# Patient Record
Sex: Female | Born: 1945
Health system: Southern US, Community
[De-identification: ages and names within clinical notes are randomized; demographics above are authoritative.]

## PROBLEM LIST (undated history)

## (undated) DIAGNOSIS — Z860100 Personal history of colon polyps, unspecified: Secondary | ICD-10-CM

## (undated) DIAGNOSIS — M549 Dorsalgia, unspecified: Secondary | ICD-10-CM

## (undated) DIAGNOSIS — G43909 Migraine, unspecified, not intractable, without status migrainosus: Secondary | ICD-10-CM

## (undated) DIAGNOSIS — F329 Major depressive disorder, single episode, unspecified: Secondary | ICD-10-CM

## (undated) DIAGNOSIS — K219 Gastro-esophageal reflux disease without esophagitis: Secondary | ICD-10-CM

## (undated) DIAGNOSIS — Z8619 Personal history of other infectious and parasitic diseases: Secondary | ICD-10-CM

## (undated) DIAGNOSIS — E785 Hyperlipidemia, unspecified: Secondary | ICD-10-CM

## (undated) DIAGNOSIS — G8929 Other chronic pain: Secondary | ICD-10-CM

## (undated) DIAGNOSIS — M5430 Sciatica, unspecified side: Secondary | ICD-10-CM

## (undated) DIAGNOSIS — F418 Other specified anxiety disorders: Secondary | ICD-10-CM

## (undated) DIAGNOSIS — S22000A Wedge compression fracture of unspecified thoracic vertebra, initial encounter for closed fracture: Secondary | ICD-10-CM

## (undated) DIAGNOSIS — K922 Gastrointestinal hemorrhage, unspecified: Secondary | ICD-10-CM

## (undated) DIAGNOSIS — F32A Depression, unspecified: Secondary | ICD-10-CM

## (undated) DIAGNOSIS — T7840XA Allergy, unspecified, initial encounter: Secondary | ICD-10-CM

## (undated) DIAGNOSIS — M542 Cervicalgia: Secondary | ICD-10-CM

## (undated) DIAGNOSIS — K589 Irritable bowel syndrome without diarrhea: Secondary | ICD-10-CM

## (undated) DIAGNOSIS — Z8719 Personal history of other diseases of the digestive system: Secondary | ICD-10-CM

## (undated) DIAGNOSIS — M1990 Unspecified osteoarthritis, unspecified site: Secondary | ICD-10-CM

## (undated) DIAGNOSIS — F419 Anxiety disorder, unspecified: Secondary | ICD-10-CM

## (undated) HISTORY — DX: Unspecified osteoarthritis, unspecified site: M19.90

## (undated) HISTORY — DX: Migraine, unspecified, not intractable, without status migrainosus: G43.909

## (undated) HISTORY — DX: Depression, unspecified: F32.A

## (undated) HISTORY — DX: Hyperlipidemia, unspecified: E78.5

## (undated) HISTORY — PX: CERVICAL CONE BIOPSY: SUR198

## (undated) HISTORY — PX: ABDOMINAL HYSTERECTOMY: SHX81

## (undated) HISTORY — DX: Irritable bowel syndrome, unspecified: K58.9

## (undated) HISTORY — DX: Other specified anxiety disorders: F41.8

## (undated) HISTORY — PX: UPPER GASTROINTESTINAL ENDOSCOPY: SHX188

## (undated) HISTORY — DX: Major depressive disorder, single episode, unspecified: F32.9

## (undated) HISTORY — DX: Personal history of other infectious and parasitic diseases: Z86.19

## (undated) HISTORY — PX: APPENDECTOMY: SHX54

## (undated) HISTORY — DX: Personal history of colonic polyps: Z86.010

## (undated) HISTORY — DX: Personal history of colon polyps, unspecified: Z86.0100

## (undated) HISTORY — PX: POLYPECTOMY: SHX149

## (undated) HISTORY — DX: Allergy, unspecified, initial encounter: T78.40XA

---

## 1998-03-02 ENCOUNTER — Ambulatory Visit (HOSPITAL_COMMUNITY): Admission: RE | Admit: 1998-03-02 | Discharge: 1998-03-02 | Payer: Self-pay | Admitting: Family Medicine

## 1998-10-07 ENCOUNTER — Encounter: Payer: Self-pay | Admitting: Cardiology

## 1998-10-07 ENCOUNTER — Ambulatory Visit (HOSPITAL_COMMUNITY): Admission: RE | Admit: 1998-10-07 | Discharge: 1998-10-07 | Payer: Self-pay | Admitting: Cardiology

## 1999-05-09 ENCOUNTER — Encounter: Admission: RE | Admit: 1999-05-09 | Discharge: 1999-05-09 | Payer: Self-pay | Admitting: Hematology and Oncology

## 2000-07-30 ENCOUNTER — Other Ambulatory Visit: Admission: RE | Admit: 2000-07-30 | Discharge: 2000-07-30 | Payer: Self-pay | Admitting: Obstetrics and Gynecology

## 2000-08-03 ENCOUNTER — Encounter: Admission: RE | Admit: 2000-08-03 | Discharge: 2000-08-03 | Payer: Self-pay | Admitting: Obstetrics and Gynecology

## 2000-08-03 ENCOUNTER — Encounter: Payer: Self-pay | Admitting: Obstetrics and Gynecology

## 2003-07-04 HISTORY — PX: COLONOSCOPY: SHX174

## 2003-07-04 LAB — HM COLONOSCOPY

## 2003-10-19 ENCOUNTER — Ambulatory Visit (HOSPITAL_COMMUNITY): Admission: RE | Admit: 2003-10-19 | Discharge: 2003-10-19 | Payer: Self-pay | Admitting: *Deleted

## 2012-12-13 ENCOUNTER — Encounter: Payer: Self-pay | Admitting: Internal Medicine

## 2012-12-13 ENCOUNTER — Other Ambulatory Visit (INDEPENDENT_AMBULATORY_CARE_PROVIDER_SITE_OTHER): Payer: Medicare PPO

## 2012-12-13 ENCOUNTER — Ambulatory Visit (INDEPENDENT_AMBULATORY_CARE_PROVIDER_SITE_OTHER)
Admission: RE | Admit: 2012-12-13 | Discharge: 2012-12-13 | Disposition: A | Discharge status: Home / Self Care | Payer: Medicare PPO | Source: Ambulatory Visit | Attending: Internal Medicine | Admitting: Internal Medicine

## 2012-12-13 ENCOUNTER — Ambulatory Visit (INDEPENDENT_AMBULATORY_CARE_PROVIDER_SITE_OTHER): Payer: Medicare PPO | Admitting: Internal Medicine

## 2012-12-13 VITALS — BP 108/72 | HR 77 | Temp 98.0°F | Ht 64.0 in | Wt 134.8 lb

## 2012-12-13 DIAGNOSIS — Z23 Encounter for immunization: Secondary | ICD-10-CM

## 2012-12-13 DIAGNOSIS — K219 Gastro-esophageal reflux disease without esophagitis: Secondary | ICD-10-CM | POA: Insufficient documentation

## 2012-12-13 DIAGNOSIS — Q782 Osteopetrosis: Secondary | ICD-10-CM

## 2012-12-13 DIAGNOSIS — F3289 Other specified depressive episodes: Secondary | ICD-10-CM

## 2012-12-13 DIAGNOSIS — Z13220 Encounter for screening for lipoid disorders: Secondary | ICD-10-CM

## 2012-12-13 DIAGNOSIS — Z136 Encounter for screening for cardiovascular disorders: Secondary | ICD-10-CM

## 2012-12-13 DIAGNOSIS — R5383 Other fatigue: Secondary | ICD-10-CM

## 2012-12-13 DIAGNOSIS — Z1239 Encounter for other screening for malignant neoplasm of breast: Secondary | ICD-10-CM

## 2012-12-13 DIAGNOSIS — R5381 Other malaise: Secondary | ICD-10-CM

## 2012-12-13 DIAGNOSIS — M810 Age-related osteoporosis without current pathological fracture: Secondary | ICD-10-CM | POA: Insufficient documentation

## 2012-12-13 DIAGNOSIS — F329 Major depressive disorder, single episode, unspecified: Secondary | ICD-10-CM

## 2012-12-13 DIAGNOSIS — F32A Depression, unspecified: Secondary | ICD-10-CM | POA: Insufficient documentation

## 2012-12-13 DIAGNOSIS — F339 Major depressive disorder, recurrent, unspecified: Secondary | ICD-10-CM | POA: Insufficient documentation

## 2012-12-13 DIAGNOSIS — Z13820 Encounter for screening for osteoporosis: Secondary | ICD-10-CM

## 2012-12-13 DIAGNOSIS — Z1211 Encounter for screening for malignant neoplasm of colon: Secondary | ICD-10-CM

## 2012-12-13 DIAGNOSIS — Z0000 Encounter for general adult medical examination without abnormal findings: Secondary | ICD-10-CM

## 2012-12-13 HISTORY — DX: Age-related osteoporosis without current pathological fracture: M81.0

## 2012-12-13 HISTORY — DX: Major depressive disorder, single episode, unspecified: F32.9

## 2012-12-13 LAB — CBC
HCT: 38.9 % (ref 36.0–46.0)
MCV: 84.6 fl (ref 78.0–100.0)
Platelets: 305 10*3/uL (ref 150.0–400.0)
RBC: 4.6 Mil/uL (ref 3.87–5.11)
WBC: 4.9 10*3/uL (ref 4.5–10.5)

## 2012-12-13 LAB — BASIC METABOLIC PANEL
BUN: 19 mg/dL (ref 6–23)
CO2: 26 mEq/L (ref 19–32)
Calcium: 9.7 mg/dL (ref 8.4–10.5)
GFR: 57.59 mL/min — ABNORMAL LOW (ref >60.00)
Glucose, Bld: 83 mg/dL (ref 70–99)
Potassium: 4.6 mEq/L (ref 3.5–5.1)

## 2012-12-13 LAB — HEPATIC FUNCTION PANEL
ALT: 18 U/L (ref 0–35)
Bilirubin, Direct: 0.1 mg/dL (ref 0.0–0.3)
Total Bilirubin: 0.8 mg/dL (ref 0.3–1.2)

## 2012-12-13 LAB — LDL CHOLESTEROL, DIRECT: Direct LDL: 161 mg/dL

## 2012-12-13 LAB — HM DEXA SCAN

## 2012-12-13 LAB — LIPID PANEL
HDL: 49.2 mg/dL (ref >39.00)
VLDL: 35.4 mg/dL (ref 0.0–40.0)

## 2012-12-13 LAB — TSH: TSH: 2.62 u[IU]/mL (ref 0.35–5.50)

## 2012-12-13 MED ORDER — OMEPRAZOLE 20 MG PO CPDR: 20.0000 mg | DELAYED_RELEASE_CAPSULE | Freq: Every day | ORAL | Status: DC

## 2012-12-13 MED ORDER — CITALOPRAM HYDROBROMIDE 20 MG PO TABS: 20.0000 mg | ORAL_TABLET | Freq: Every day | ORAL | Status: DC

## 2012-12-13 NOTE — Progress Notes (Signed)
HPI  Pt presents to the clinic today to establish care. She has not seen a doctor in quite a number of years. She does have some concern today about situational depression. She has has issues with this after the loss  Of her husband and her mother. She does have a strong support network but does not typically talk about her feelings. She is keeping it all in. She does intermittently take her husbands celexa as needed. She is not interested in any counseling at this time. She denies si/hi.  Flu: 04/2012 Tdap: more than 10 years ago Pneumovax: never Shingles:never Eye doctor: never Dentist: as needed Pap smear: more than 10 years ago Mammogram: more than 10 years ago Colonoscopy: 2005 (5 years) Dexa Scan: > 2 years ago  Past Medical History  Diagnosis Date  . Arthritis   . Depression   . Migraines   . Allergy   . History of chicken pox   . History of colon polyps     Current Outpatient Prescriptions  Medication Sig Dispense Refill  . Ferrous Sulfate (RA IRON) 27 MG TABS Take 1 tablet by mouth as needed.      . Multiple Vitamin (STRESS FORMULA 500/BIOTIN PO) Take 1 tablet by mouth daily.      Marland Kitchen omeprazole (PRILOSEC) 20 MG capsule Take 20 mg by mouth daily.      . ranitidine (ZANTAC) 150 MG tablet Take 150 mg by mouth as needed for heartburn.       No current facility-administered medications for this visit.    No Known Allergies  Family History  Problem Relation Age of Onset  . Heart disease Mother   . Heart disease Father   . Prostate cancer Father   . Diabetes Sister   . Diabetes Brother     History   Social History  . Marital Status: Married    Spouse Name: N/A    Number of Children: N/A  . Years of Education: 12   Occupational History  . Retired    Social History Main Topics  . Smoking status: Never Smoker   . Smokeless tobacco: Never Used  . Alcohol Use: No  . Drug Use: No  . Sexually Active: Not Currently   Other Topics Concern  . Not on file    Social History Narrative   Regular exercise-no   Caffeine Use-yes    ROS:  Constitutional: Pt reports fatigue. Denies fever, malaise, headache or abrupt weight changes.  HEENT: Denies eye pain, eye redness, ear pain, ringing in the ears, wax buildup, runny nose, nasal congestion, bloody nose, or sore throat. Respiratory: Denies difficulty breathing, shortness of breath, cough or sputum production.   Cardiovascular: Denies chest pain, chest tightness, palpitations or swelling in the hands or feet.  Gastrointestinal: Denies abdominal pain, bloating, constipation, diarrhea or blood in the stool.  GU: Denies frequency, urgency, pain with urination, blood in urine, odor or discharge. Musculoskeletal: Denies decrease in range of motion, difficulty with gait, muscle pain or joint pain and swelling.  Skin: Denies redness, rashes, lesions or ulcercations.  Neurological: Denies dizziness, difficulty with memory, difficulty with speech or problems with balance and coordination.   No other specific complaints in a complete review of systems (except as listed in HPI above).  PE:  BP 108/72  Pulse 77  Temp(Src) 98 F (36.7 C) (Oral)  Ht 5\' 4"  (1.626 m)  Wt 134 lb 12.8 oz (61.145 kg)  BMI 23.13 kg/m2  SpO2 98% Wt Readings from Last  3 Encounters:  12/13/12 134 lb 12.8 oz (61.145 kg)    General: Appears her stated age, well developed, well nourished in NAD. HEENT: Head: normal shape and size; Eyes: sclera white, no icterus, conjunctiva pink, PERRLA and EOMs intact; Ears: Tm's gray and intact, normal light reflex; Nose: mucosa pink and moist, septum midline; Throat/Mouth: Teeth present, mucosa pink and moist, no lesions or ulcerations noted.  Neck: Normal range of motion. Neck supple, trachea midline. No massses, lumps or thyromegaly present.  Cardiovascular: Normal rate and rhythm. S1,S2 noted.  No murmur, rubs or gallops noted. No JVD or BLE edema. No carotid bruits noted. Pulmonary/Chest:  Normal effort and positive vesicular breath sounds. No respiratory distress. No wheezes, rales or ronchi noted.  Abdomen: Soft and nontender. Normal bowel sounds, no bruits noted. No distention or masses noted. Liver, spleen and kidneys non palpable. Musculoskeletal: Normal range of motion. No signs of joint swelling. No difficulty with gait.  Neurological: Alert and oriented. Cranial nerves II-XII intact. Coordination normal. +DTRs bilaterally. Psychiatric: Mood and affect normal. Behavior is normal. Judgment and thought content normal.      Assessment and Plan:  Preventative Health Maintenance:  Will give tdap and pneumovax today Call insurance about shingles vaccine Schedule pap with me Will set up mammogram and colonoscopy Will obtain labs and dexa scan

## 2012-12-13 NOTE — Assessment & Plan Note (Signed)
Will refill prilosec

## 2012-12-13 NOTE — Patient Instructions (Signed)

## 2012-12-13 NOTE — Assessment & Plan Note (Signed)
Support and reassurance give Will start celexa RTC in 1 month to reevaluate

## 2012-12-18 ENCOUNTER — Ambulatory Visit (HOSPITAL_COMMUNITY)
Admission: RE | Admit: 2012-12-18 | Discharge: 2012-12-18 | Disposition: A | Discharge status: Home / Self Care | Payer: Medicare PPO | Source: Ambulatory Visit | Attending: Internal Medicine | Admitting: Internal Medicine

## 2012-12-18 ENCOUNTER — Encounter: Payer: Self-pay | Admitting: Gastroenterology

## 2012-12-18 DIAGNOSIS — Z1231 Encounter for screening mammogram for malignant neoplasm of breast: Secondary | ICD-10-CM | POA: Insufficient documentation

## 2012-12-18 DIAGNOSIS — Z1239 Encounter for other screening for malignant neoplasm of breast: Secondary | ICD-10-CM

## 2013-01-10 ENCOUNTER — Other Ambulatory Visit (HOSPITAL_COMMUNITY)
Admission: RE | Admit: 2013-01-10 | Discharge: 2013-01-10 | Disposition: A | Discharge status: Home / Self Care | Payer: Medicare PPO | Source: Ambulatory Visit | Attending: Internal Medicine | Admitting: Internal Medicine

## 2013-01-10 ENCOUNTER — Encounter: Payer: Self-pay | Admitting: Internal Medicine

## 2013-01-10 ENCOUNTER — Ambulatory Visit (INDEPENDENT_AMBULATORY_CARE_PROVIDER_SITE_OTHER): Payer: Medicare PPO | Admitting: Internal Medicine

## 2013-01-10 VITALS — BP 118/72 | HR 69 | Temp 98.3°F | Wt 133.4 lb

## 2013-01-10 DIAGNOSIS — Z01419 Encounter for gynecological examination (general) (routine) without abnormal findings: Secondary | ICD-10-CM

## 2013-01-10 DIAGNOSIS — Z124 Encounter for screening for malignant neoplasm of cervix: Secondary | ICD-10-CM

## 2013-01-10 NOTE — Progress Notes (Signed)
  Subjective:    Patient ID: Charlene Patterson, female    DOB: [birth year], 67 y.o.   MRN: [id number]  HPI  Pt presents to the clinic today for her pap smear. Her last pap smear was more than 10 years ago. She denies vaginal pain, dryness, discharge, odor or abnormal uterine bleeding. Her mammogram was done 12/2012.  Review of Systems      Past Medical History  Diagnosis Date  . Arthritis   . Depression   . Migraines   . Allergy   . History of chicken pox   . History of colon polyps     Current Outpatient Prescriptions  Medication Sig Dispense Refill  . citalopram (CELEXA) 20 MG tablet Take 1 tablet (20 mg total) by mouth daily.  30 tablet  3  . Ferrous Sulfate (RA IRON) 27 MG TABS Take 1 tablet by mouth as needed.      . Multiple Vitamin (STRESS FORMULA 500/BIOTIN PO) Take 1 tablet by mouth daily.      Marland Kitchen omeprazole (PRILOSEC) 20 MG capsule Take 1 capsule (20 mg total) by mouth daily.  30 capsule  3  . ranitidine (ZANTAC) 150 MG tablet Take 150 mg by mouth as needed for heartburn.       No current facility-administered medications for this visit.    No Known Allergies  Family History  Problem Relation Age of Onset  . Heart disease Mother   . Heart disease Father   . Prostate cancer Father   . Diabetes Sister   . Diabetes Brother     History   Social History  . Marital Status: Widowed    Spouse Name: N/A    Number of Children: N/A  . Years of Education: 12   Occupational History  . Retired    Social History Main Topics  . Smoking status: Never Smoker   . Smokeless tobacco: Never Used  . Alcohol Use: No  . Drug Use: No  . Sexually Active: Not Currently   Other Topics Concern  . Not on file   Social History Narrative   Regular exercise-no   Caffeine Use-yes     Constitutional: Denies fever, malaise, fatigue, headache or abrupt weight changes. .  GU: Denies urgency, frequency, pain with urination, burning sensation, blood in urine, odor or  discharge. Skin: Denies redness, rashes, lesions or ulcercations.    No other specific complaints in a complete review of systems (except as listed in HPI above).  Objective:   Physical Exam  Constitutional:  Alert, oriented x 4, well developed, well nourished in no apparent distress. Skin: Skin is warm and dry.  No erythema, lesion or ulceration noted. Cardiovascular: Normal rate and rhythm. S1,S2 noted.  No murmur, rubs or gallops noted. No JVD or BLE edema. No carotid bruits noted. Pulmonary/Chest: Normal effort and positive vesicular breath sounds. No respiratory distress. No wheezes, rales or ronchi noted.  Genitourinary: Normal female anatomy. Uterus midline, anterior and soft. No CMT or discharge noted. Adenexa non palpable. Breast without lumps or masses.        Assessment & Plan:   Screening for cervical cancer with routine gyn exam:  Pap smear obtained today Will send off for processing  RTC as needed or for your next scheduled followup

## 2013-01-10 NOTE — Patient Instructions (Signed)

## 2013-01-28 ENCOUNTER — Telehealth: Payer: Self-pay | Admitting: *Deleted

## 2013-01-28 ENCOUNTER — Ambulatory Visit (AMBULATORY_SURGERY_CENTER): Payer: Medicare PPO | Admitting: *Deleted

## 2013-01-28 VITALS — Ht 64.0 in | Wt 134.8 lb

## 2013-01-28 DIAGNOSIS — Z86010 Personal history of colonic polyps: Secondary | ICD-10-CM

## 2013-01-28 MED ORDER — NA SULFATE-K SULFATE-MG SULF 17.5-3.13-1.6 GM/177ML PO SOLN: 1.0000 | Freq: Once | ORAL | Status: DC

## 2013-01-28 NOTE — Telephone Encounter (Signed)
This pt has a colon scheduled for 02-11-13  At 1130 w/ dr Arlyce Dice and was a pt of dr orr.  We need to obtain old records please. Pt states last colon was in 2005 and she did have polyps but unsure the type.  Records release signed and to Merri Ray CMA for [place]. Thanks  Hilda Lias pre visit

## 2013-01-28 NOTE — Progress Notes (Signed)
No egg or soy allergy. ewm No home 02 use, nothing electrical in heart. ewm No problems with past sedation. ewm emmi video to pt's email. ewm Last colon in 2005 w/dr orr. To obtain records. ewm

## 2013-01-29 NOTE — Telephone Encounter (Signed)
Got old colon endo report from echart for dr Arlyce Dice to review. They are on his desk

## 2013-01-31 NOTE — Telephone Encounter (Signed)
2005 colonoscopy negative for polyps

## 2013-02-03 ENCOUNTER — Encounter: Payer: Self-pay | Admitting: Gastroenterology

## 2013-02-11 ENCOUNTER — Encounter: Payer: Self-pay | Admitting: Gastroenterology

## 2013-02-11 ENCOUNTER — Ambulatory Visit (AMBULATORY_SURGERY_CENTER): Payer: Medicare PPO | Admitting: Gastroenterology

## 2013-02-11 VITALS — BP 110/65 | HR 64 | Temp 97.5°F | Resp 19 | Ht 64.0 in | Wt 134.0 lb

## 2013-02-11 DIAGNOSIS — D126 Benign neoplasm of colon, unspecified: Secondary | ICD-10-CM

## 2013-02-11 DIAGNOSIS — Z86010 Personal history of colonic polyps: Secondary | ICD-10-CM

## 2013-02-11 MED ORDER — SODIUM CHLORIDE 0.9 % IV SOLN: 500.0000 mL | INTRAVENOUS | Status: DC

## 2013-02-11 NOTE — Progress Notes (Signed)
Stable to RR 

## 2013-02-11 NOTE — Progress Notes (Signed)
Patient did not experience any of the following events: a burn prior to discharge; a fall within the facility; wrong site/side/patient/procedure/implant event; or a hospital transfer or hospital admission upon discharge from the facility. (G8907) Patient did not have preoperative order for IV antibiotic SSI prophylaxis. (G8918)  

## 2013-02-11 NOTE — Patient Instructions (Addendum)
YOU HAD AN ENDOSCOPIC PROCEDURE TODAY AT THE [facility]: Refer to the procedure report that was given to you for any specific questions about what was found during the examination.  If the procedure report does not answer your questions, please call your gastroenterologist to clarify.  If you requested that your care partner not be given the details of your procedure findings, then the procedure report has been included in a sealed envelope for you to review at your convenience later.  YOU SHOULD EXPECT: Some feelings of bloating in the abdomen. Passage of more gas than usual.  Walking can help get rid of the air that was put into your GI tract during the procedure and reduce the bloating. If you had a lower endoscopy (such as a colonoscopy or flexible sigmoidoscopy) you may notice spotting of blood in your stool or on the toilet paper. If you underwent a bowel prep for your procedure, then you may not have a normal bowel movement for a few days.  DIET: Your first meal following the procedure should be a light meal and then it is ok to progress to your normal diet.  A half-sandwich or bowl of soup is an example of a good first meal.  Heavy or fried foods are harder to digest and may make you feel nauseous or bloated.  Likewise meals heavy in dairy and vegetables can cause extra gas to form and this can also increase the bloating.  Drink plenty of fluids but you should avoid alcoholic beverages for 24 hours.  ACTIVITY: Your care partner should take you home directly after the procedure.  You should plan to take it easy, moving slowly for the rest of the day.  You can resume normal activity the day after the procedure however you should NOT DRIVE or use heavy machinery for 24 hours (because of the sedation medicines used during the test).    SYMPTOMS TO REPORT IMMEDIATELY: A gastroenterologist can be reached at any hour.  During normal business hours, 8:30 AM to 5:00 PM Monday through Friday,  call [phone].  After hours and on weekends, please call the GI answering service at [phone] who will take a message and have the physician on call contact you.   Following lower endoscopy (colonoscopy or flexible sigmoidoscopy):  Excessive amounts of blood in the stool  Significant tenderness or worsening of abdominal pains  Swelling of the abdomen that is new, acute  Fever of 100F or higher    FOLLOW UP: If any biopsies were taken you will be contacted by phone or by letter within the next 1-3 weeks.  Call your gastroenterologist if you have not heard about the biopsies in 3 weeks.  Our staff will call the home number listed on your records the next business day following your procedure to check on you and address any questions or concerns that you may have at that time regarding the information given to you following your procedure. This is a courtesy call and so if there is no answer at the home number and we have not heard from you through the emergency physician on call, we will assume that you have returned to your regular daily activities without incident.  SIGNATURES/CONFIDENTIALITY: You and/or your care partner have signed paperwork which will be entered into your electronic medical record.  These signatures attest to the fact that that the information above on your After Visit Summary has been reviewed and is understood.  Full responsibility of the confidentiality   of this discharge information lies with you and/or your care-partner.     

## 2013-02-11 NOTE — Progress Notes (Signed)
Called to room to assist during endoscopic procedure.  Patient ID and intended procedure confirmed with present staff. Received instructions for my participation in the procedure from the performing physician.  

## 2013-02-11 NOTE — Op Note (Signed)
[facility] [address] [place], [address]   COLONOSCOPY PROCEDURE REPORT  PATIENT: Charlene Patterson, Charlene Patterson  MR#: [id number] BIRTHDATE: 01-Nov-1945 , 67  yrs. old GENDER: Female ENDOSCOPIST: Louis Meckel, MD REFERRED BY: PROCEDURE DATE:  02/11/2013 PROCEDURE:   Colonoscopy with cold biopsy polypectomy First Screening Colonoscopy - Avg.  risk and is 50 yrs.  old or older - No.  Prior Negative Screening - Now for repeat screening. N/A  History of Adenoma - Now for follow-up colonoscopy & has been > or = to 3 yrs.  Yes hx of adenoma.  Has been 3 or more years since last colonoscopy.  Polyps Removed Today? Yes. ASA CLASS:   Class II INDICATIONS:Patient's personal history of colon polyps. MEDICATIONS: MAC sedation, administered by CRNA and propofol (Diprivan) 200mg  IV  DESCRIPTION OF PROCEDURE:   After the risks benefits and alternatives of the procedure were thoroughly explained, informed consent was obtained.  A digital rectal exam revealed no abnormalities of the rectum.   The [id number]  endoscope was introduced through the anus and advanced to the cecum, which was identified by both the appendix and ileocecal valve. No adverse events experienced.   The quality of the prep was excellent using Suprep  The instrument was then slowly withdrawn as the colon was fully examined.      COLON FINDINGS: A sessile polyp measuring 2 mm in size was found in the sigmoid colon.  A polypectomy was performed with cold forceps. The colon mucosa was otherwise normal.  Retroflexed views revealed no abnormalities. The time to cecum=  .  Withdrawal time=15 minutes 0 seconds.  The scope was withdrawn and the procedure completed. COMPLICATIONS: There were no complications.  ENDOSCOPIC IMPRESSION: 1.   Sessile polyp measuring 2 mm in size was found in the sigmoid colon; polypectomy was performed with cold forceps 2.   The colon mucosa was otherwise  normal  RECOMMENDATIONS: If the polyp(s) removed today are proven to be adenomatous (pre-cancerous) polyps, you will need a repeat colonoscopy in 5 years.  Otherwise you should continue to follow colorectal cancer screening guidelines for "routine risk" patients with colonoscopy in 10 years.  You will receive a letter within 1-2 weeks with the results of your biopsy as well as final recommendations.  Please call my office if you have not received a letter after 3 weeks.   eSigned:  Louis Meckel, MD 02/11/2013 11:56 AM   cc:  Nicki Reaper, MD   PATIENT NAME:  [name], [name] MR#: [id number]

## 2013-02-12 ENCOUNTER — Telehealth: Payer: Self-pay | Admitting: *Deleted

## 2013-02-12 NOTE — Telephone Encounter (Signed)
Left message on number given in admitting yesterday to return call with questions, concerns or problems. ewm

## 2013-02-27 ENCOUNTER — Encounter: Payer: Self-pay | Admitting: Gastroenterology

## 2013-05-12 ENCOUNTER — Telehealth: Payer: Self-pay | Admitting: *Deleted

## 2013-05-12 ENCOUNTER — Other Ambulatory Visit: Payer: Self-pay | Admitting: Internal Medicine

## 2013-05-12 DIAGNOSIS — F329 Major depressive disorder, single episode, unspecified: Secondary | ICD-10-CM

## 2013-05-12 DIAGNOSIS — F32A Depression, unspecified: Secondary | ICD-10-CM

## 2013-05-12 DIAGNOSIS — K219 Gastro-esophageal reflux disease without esophagitis: Secondary | ICD-10-CM

## 2013-05-12 MED ORDER — OMEPRAZOLE 20 MG PO CPDR: 20.0000 mg | DELAYED_RELEASE_CAPSULE | Freq: Every day | ORAL | Status: DC

## 2013-05-12 MED ORDER — CITALOPRAM HYDROBROMIDE 20 MG PO TABS: 20.0000 mg | ORAL_TABLET | Freq: Every day | ORAL | Status: DC

## 2013-05-12 NOTE — Telephone Encounter (Signed)
erx sent to [facility]

## 2013-05-12 NOTE — Telephone Encounter (Signed)
Pt called requesting Omeprazole and Citalopram refill.  Please advise

## 2013-05-12 NOTE — Telephone Encounter (Signed)
Spoke with pt advised Rx sent 

## 2013-09-17 ENCOUNTER — Other Ambulatory Visit: Payer: Self-pay

## 2013-09-17 DIAGNOSIS — F329 Major depressive disorder, single episode, unspecified: Secondary | ICD-10-CM

## 2013-09-17 DIAGNOSIS — K219 Gastro-esophageal reflux disease without esophagitis: Secondary | ICD-10-CM

## 2013-09-17 DIAGNOSIS — F32A Depression, unspecified: Secondary | ICD-10-CM

## 2013-09-17 MED ORDER — CITALOPRAM HYDROBROMIDE 20 MG PO TABS: 20.0000 mg | ORAL_TABLET | Freq: Every day | ORAL | Status: DC

## 2013-09-17 MED ORDER — OMEPRAZOLE 20 MG PO CPDR: 20.0000 mg | DELAYED_RELEASE_CAPSULE | Freq: Every day | ORAL | Status: DC

## 2013-09-17 NOTE — Telephone Encounter (Signed)
Last OV 12/2012 and last filled 05/12/2013 w/ 3 refills--please advise if okay to refill

## 2013-09-25 ENCOUNTER — Encounter: Payer: Self-pay | Admitting: Physician Assistant

## 2013-09-25 ENCOUNTER — Ambulatory Visit (INDEPENDENT_AMBULATORY_CARE_PROVIDER_SITE_OTHER): Payer: Medicare PPO | Admitting: Physician Assistant

## 2013-09-25 VITALS — BP 112/78 | HR 83 | Temp 98.6°F | Ht 64.0 in | Wt 133.0 lb

## 2013-09-25 DIAGNOSIS — J309 Allergic rhinitis, unspecified: Secondary | ICD-10-CM

## 2013-09-25 DIAGNOSIS — IMO0002 Reserved for concepts with insufficient information to code with codable children: Secondary | ICD-10-CM

## 2013-09-25 DIAGNOSIS — M5410 Radiculopathy, site unspecified: Secondary | ICD-10-CM

## 2013-09-25 DIAGNOSIS — H9190 Unspecified hearing loss, unspecified ear: Secondary | ICD-10-CM

## 2013-09-25 DIAGNOSIS — H938X9 Other specified disorders of ear, unspecified ear: Secondary | ICD-10-CM

## 2013-09-25 NOTE — Progress Notes (Signed)
Pre visit review using our clinic review tool, if applicable. No additional management support is needed unless otherwise documented below in the visit note. 

## 2013-09-25 NOTE — Progress Notes (Signed)
Subjective:    Patient ID: Charlene Patterson, female    DOB: [birth year], 68 y.o.   MRN: [id number]  HPI Comments: Patient is a 68 year old female who presents to the office for follow up visit for multiple complaints. Reports she does not like to come to the doctor's office and does not like to be taking medications.  1. Sinus issues -  Spends a lot of time in the outdoors Has sneezing, itching, watery eyes with runny nose and post nasal drainge.  Intermittent dry cough. No N/V/F, ear pain Has used OTC allergy pills with good relief.  2. Back pain, left sided Off and on for the last couple months Only when moves in certain directions like bending. Radiates from left buttock down back of leg to mid thigh No rash, numbness tingling to distal extremity, no bowel/bladder incontinence or retention, no saddle anesthesia.  Has used ibuprofen with mild relief. Distant history of "chip" to left hip secondary to trauma.  3 reports perceived hearing loss Has to have TV turned up loud enough to where it is disturbing to others in the household. Asks people frequently to repeat themselves when talking with her.  Denies ear pain or feeling of fullness.   Reports no other concerns at this time.    Review of Systems  Constitutional: Negative for fever, chills, activity change and appetite change.  HENT: Positive for hearing loss, postnasal drip, rhinorrhea and sneezing. Negative for congestion, ear discharge, ear pain, sore throat and trouble swallowing.   Eyes: Negative for pain and visual disturbance.  Respiratory: Positive for cough (off and on). Negative for chest tightness, shortness of breath and wheezing.   Cardiovascular: Negative for chest pain and palpitations.  Gastrointestinal: Negative for nausea, vomiting and abdominal pain.  Genitourinary:       No incontinence  Musculoskeletal: Positive for back pain (left buttock to mid thigh). Negative for gait problem.  Skin: Negative for  rash.  Neurological: Negative for dizziness, weakness and headaches.   Past Medical History  Diagnosis Date  . Arthritis   . Depression   . Migraines   . Allergy   . History of chicken pox   . History of colon polyps        Objective:   Physical Exam  Vitals reviewed. Constitutional: She is oriented to person, place, and time. She appears well-developed and well-nourished. No distress.  HENT:  Head: Normocephalic and atraumatic.  Right Ear: Tympanic membrane, external ear and ear canal normal.  Left Ear: Tympanic membrane, external ear and ear canal normal.  Nose: Mucosal edema (mild, R>L) and rhinorrhea (clear) present. No sinus tenderness. No epistaxis. Right sinus exhibits no maxillary sinus tenderness and no frontal sinus tenderness. Left sinus exhibits no maxillary sinus tenderness and no frontal sinus tenderness.  Mouth/Throat: Uvula is midline and mucous membranes are normal. No trismus in the jaw. No oropharyngeal exudate, posterior oropharyngeal edema or posterior oropharyngeal erythema.  Decreased hearing bilateral, not able to hear rubbing fingers.  Cardiovascular: Normal rate, regular rhythm and intact distal pulses.  Exam reveals no gallop and no friction rub.   No murmur heard. Pulses:      Posterior tibial pulses are 2+ on the right side, and 2+ on the left side.  Musculoskeletal:  Unable to reproduce pain of left lower back or buttock with light or deep palpation. No midline tenderness noted. FROM of lumbar spine.   Lymphadenopathy:    She has no cervical adenopathy.  Neurological: She  is alert and oriented to person, place, and time.  Reflex Scores:      Patellar reflexes are 2+ on the right side and 2+ on the left side. Gait normal, bilateral LE DNVI   Lab Results  Component Value Date   WBC 4.9 12/13/2012   HGB 13.2 12/13/2012   HCT 38.9 12/13/2012   PLT 305.0 12/13/2012   GLUCOSE 83 12/13/2012   CHOL 256* 12/13/2012   TRIG 177.0* 12/13/2012   HDL 49.20  12/13/2012   LDLDIRECT 161.0 12/13/2012   ALT 18 12/13/2012   AST 20 12/13/2012   NA 139 12/13/2012   K 4.6 12/13/2012   CL 108 12/13/2012   CREATININE 1.0 12/13/2012   BUN 19 12/13/2012   CO2 26 12/13/2012   TSH 2.62 12/13/2012   Filed Vitals:   09/25/13 1018  BP: 112/78  Pulse: 83  Temp: 98.6 F (37 C)      Assessment & Plan:    Allergic rhinitis Continue with OTC antihistamine  Back pain Continue with Ibuprofen as needed for symptom relief Read and try exercises and stretches as provided on patient education sheet If no improvement of symptoms possible follow up with Dr. Charlann Boxer, sports medicine DO.  Hearing changes Referral to audiology for further evaluation  Patient instructed to return to office in next 2-3 months for wellness exam and labs.

## 2013-09-25 NOTE — Patient Instructions (Signed)
It was great to meet you today Charlene Patterson!  You are due for a complete physical. I would like you to schedule for a physical exam in the next couple months.   Piriformis Syndrome with Rehab Piriformis syndrome is a condition the affects the nervous system in the area of the hip, and is characterized by pain and possibly a loss of feeling in the backside (posterior) thigh that may extend down the entire length of the leg. The symptoms are caused by an increase in pressure on the sciatic nerve by the piriformis muscle, which is on the back of the hip and is responsible for externally rotating the hip. The sciatic nerve and its branches connect to much of the leg. Normally the sciatic nerve runs between the piriformis muscle and other muscles. However, in certain individuals the nerve runs through the muscle, which causes an increase in pressure on the nerve and results in the symptoms of piriformis syndrome. SYMPTOMS   Pain, tingling, numbness, or burning in the back of the thigh that may also extend down the entire leg.  Occasionally, tenderness in the buttock.  Loss of function of the leg.  Pain that worsens when using the piriformis muscle (running, jumping, or stairs).  Pain that increases with prolonged sitting.  Pain that is lessened by laying flat on the back. CAUSES   Piriformis syndrome is the result of an increase in pressure placed on the sciatic nerve. Often times piriformis syndrome is an overuse injury.  Stress placed on the nerve from a sudden increase in the intensity, frequency, or duration of training.  Compensation of other extremity injuries. RISK INCREASES WITH:  Sports that involve the piriformis muscle (running, walking or jumping).  You are born with (congenital) a defect in which the sciatic nerve passes through the muscle. PREVENTION  Warm up and stretch properly before activity.  Allow for adequate recovery between workouts.  Maintain physical  fitness:  Strength, flexibility, and endurance.  Cardiovascular fitness. PROGNOSIS  If treated properly, then the symptoms of piriformis syndrome usually resolve in 2 to 6 weeks. RELATED COMPLICATIONS   Persistent and possibly permanent pain and numbness in the lower extremity.  Weakness of the extremity that may progress to disability and inability to compete. TREATMENT  The most effective treatment for piriformis syndrome is rest from any activities that aggravate the symptoms. Ice and pain medication may help reduce pain and inflammation. The use of strengthening and stretching exercises may help reduce pain with activity. These exercises may be performed at home or with a therapist. A referral to a therapist may be given for further evaluation and treatment, such as ultrasound. Corticosteroid injections may be given to reduce inflammation that is causing pressure to be placed on the sciatic nerve. If non-surgical (conservative) treatment is unsuccessful, then surgery may be recommended.  MEDICATION   If pain medication is necessary, then nonsteroidal anti-inflammatory medications, such as aspirin and ibuprofen, or other minor pain relievers, such as acetaminophen, are often recommended.  Do not take pain medication for 7 days before surgery.  Prescription pain relievers may be given if deemed necessary by your caregiver. Use only as directed and only as much as you need.  Corticosteroid injections may be given by your caregiver. These injections should be reserved for the most serious cases, because they may only be given a certain number of times. HEAT AND COLD:   Cold treatment (icing) relieves pain and reduces inflammation. Cold treatment should be applied for 10 to  15 minutes every 2 to 3 hours for inflammation and pain and immediately after any activity that aggravates your symptoms. Use ice packs or massage the area with a piece of ice (ice massage).  Heat treatment may be used  prior to performing the stretching and strengthening activities prescribed by your caregiver, physical therapist, or athletic trainer. Use a heat pack or soak the injury in warm water. SEEK IMMEDIATE MEDICAL CARE IF:  Treatment seems to offer no benefit, or the condition worsens.  Any medications produce adverse side effects. EXERCISES RANGE OF MOTION (ROM) AND STRETCHING EXERCISES - Piriformis Syndrome These exercises may help you when beginning to rehabilitate your injury. Your symptoms may resolve with or without further involvement from your physician, physical therapist or athletic trainer. While completing these exercises, remember:   Restoring tissue flexibility helps normal motion to return to the joints. This allows healthier, less painful movement and activity.  An effective stretch should be held for at least 30 seconds.  A stretch should never be painful. You should only feel a gentle lengthening or release in the stretched tissue. STRETCH - Hip Rotators  Lie on your back on a firm surface. Grasp your right / left knee with your right / left hand and your ankle with your opposite hand.  Keeping your hips and shoulders firmly planted, gently pull your right / left knee and rotate your lower leg toward your opposite shoulder until you feel a stretch in your buttocks.  Hold this stretch for __________ seconds. Repeat this stretch __________ times. Complete this stretch __________ times per day. STRETCH  Iliotibial Band  On the floor or bed, lie on your side so your right / left leg is on top. Bend your knee and grab your ankle.  Slowly bring your knee back so that your thigh is in line with your trunk. Keep your heel at your buttocks and gently arch your back so your head, shoulders and hips line up.  Slowly lower your leg so that your knee approaches the floor/bed until you feel a gentle stretch on the outside of your right / left thigh. If you do not feel a stretch and your  knee will not fall farther, place the heel of your opposite foot on top of your knee and pull your thigh down farther.  Hold this stretch for __________ seconds. Repeat __________ times. Complete __________ times per day. STRENGTHENING EXERCISES - Piriformis Syndrome  These are some of the caregiver again or until your symptoms are resolved. Remember:   Strong muscles with good endurance tolerate stress better.  Do the exercises as initially prescribed by your caregiver. Progress slowly with each exercise, gradually increasing the number of repetitions and weight used under their guidance. STRENGTH - Hip Abductors, Straight Leg Raises Be aware of your form throughout the entire exercise so that you exercise the correct muscles. Sloppy form means that you are not strengthening the correct muscles.  Lie on your side so that your head, shoulders, knee and hip line up. You may bend your lower knee to help maintain your balance. Your right / left leg should be on top.  Roll your hips slightly forward, so that your hips are stacked directly over each other and your right / left knee is facing forward.  Lift your top leg up 4-6 inches, leading with your heel. Be sure that your foot does not drift forward or that your knee does not roll toward the ceiling.  Hold this position for __________  seconds. You should feel the muscles in your outer hip lifting (you may not notice this until your leg begins to tire).  Slowly lower your leg to the starting position. Allow the muscles to fully relax before beginning the next repetition. Repeat __________ times. Complete this exercise __________ times per day.  STRENGTH - Hip Abductors, Quadriped  On a firm, lightly padded surface, position yourself on your hands and knees. Your hands should be directly below your shoulders and your knees should be directly below your hips.  Keeping your right / left knee bent, lift your leg out to the side. Keep your legs  level and in line with your shoulders.  Position yourself on your hands and knees.  Hold for __________ seconds.  Keeping your trunk steady and your hips level, slowly lower your leg to the starting position. Repeat __________ times. Complete this exercise __________ times per day.  STRENGTH - Hip Abductors, Standing  Tie one end of a rubber exercise band/tubing to a secure surface (table, pole) and tie a loop at the other end.  Place the loop around your right / left ankle. Keeping your ankle with the band directly opposite of the secured end, step away until there is tension in the tube/band.  Hold onto a chair as needed for balance.  Keeping your back upright, your shoulders over your hips, and your toes pointing forward, lift your right / left leg out to your side. Be sure to lift your leg with your hip muscles. Do not "throw" your leg or tip your body to lift your leg.  Slowly and with control, return to the starting position. Repeat exercise __________ times. Complete this exercise __________ times per day.  Document Released: 06/19/2005 Document Revised: 12/19/2011 Document Reviewed: 10/01/2008 [facility] Patient Information 2014 [place], [place].     Allergic Rhinitis Allergic rhinitis is when the mucous membranes in the nose respond to allergens. Allergens are particles in the air that cause your body to have an allergic reaction. This causes you to release allergic antibodies. Through a chain of events, these eventually cause you to release histamine into the blood stream. Although meant to protect the body, it is this release of histamine that causes your discomfort, such as frequent sneezing, congestion, and an itchy, runny nose.  CAUSES  Seasonal allergic rhinitis (hay fever) is caused by pollen allergens that may come from grasses, trees, and weeds. Year-round allergic rhinitis (perennial allergic rhinitis) is caused by allergens such as house dust mites, pet dander, and mold  spores.  SYMPTOMS   Nasal stuffiness (congestion).  Itchy, runny nose with sneezing and tearing of the eyes. DIAGNOSIS  Your health care provider can help you determine the allergen or allergens that trigger your symptoms. If you and your health care provider are unable to determine the allergen, skin or blood testing may be used. TREATMENT  Allergic Rhinitis does not have a cure, but it can be controlled by:  Medicines and allergy shots (immunotherapy).  Avoiding the allergen. Hay fever may often be treated with antihistamines in pill or nasal spray forms. Antihistamines block the effects of histamine. There are over-the-counter medicines that may help with nasal congestion and swelling around the eyes. Check with your health care provider before taking or giving this medicine.  If avoiding the allergen or the medicine prescribed do not work, there are many new medicines your health care provider can prescribe. Stronger medicine may be used if initial measures are ineffective. Desensitizing injections can be used  if medicine and avoidance does not work. Desensitization is when a patient is given ongoing shots until the body becomes less sensitive to the allergen. Make sure you follow up with your health care provider if problems continue. HOME CARE INSTRUCTIONS It is not possible to completely avoid allergens, but you can reduce your symptoms by taking steps to limit your exposure to them. It helps to know exactly what you are allergic to so that you can avoid your specific triggers. SEEK MEDICAL CARE IF:   You have a fever.  You develop a cough that does not stop easily (persistent).  You have shortness of breath.  You start wheezing.  Symptoms interfere with normal daily activities. Document Released: 03/14/2001 Document Revised: 04/09/2013 Document Reviewed: 02/24/2013 [facility] Patient Information 2014 [place].

## 2013-11-14 ENCOUNTER — Emergency Department (HOSPITAL_COMMUNITY)
Admission: EM | Admit: 2013-11-14 | Discharge: 2013-11-15 | Disposition: A | Discharge status: Home / Self Care | Payer: Medicare PPO | Attending: Emergency Medicine | Admitting: Emergency Medicine

## 2013-11-14 ENCOUNTER — Encounter (HOSPITAL_COMMUNITY): Payer: Self-pay | Admitting: Emergency Medicine

## 2013-11-14 ENCOUNTER — Emergency Department (HOSPITAL_COMMUNITY): Payer: Medicare PPO

## 2013-11-14 DIAGNOSIS — Z8619 Personal history of other infectious and parasitic diseases: Secondary | ICD-10-CM | POA: Insufficient documentation

## 2013-11-14 DIAGNOSIS — Z860100 Personal history of colon polyps, unspecified: Secondary | ICD-10-CM | POA: Insufficient documentation

## 2013-11-14 DIAGNOSIS — Z791 Long term (current) use of non-steroidal anti-inflammatories (NSAID): Secondary | ICD-10-CM | POA: Insufficient documentation

## 2013-11-14 DIAGNOSIS — Z79899 Other long term (current) drug therapy: Secondary | ICD-10-CM | POA: Insufficient documentation

## 2013-11-14 DIAGNOSIS — G43909 Migraine, unspecified, not intractable, without status migrainosus: Secondary | ICD-10-CM | POA: Insufficient documentation

## 2013-11-14 DIAGNOSIS — M129 Arthropathy, unspecified: Secondary | ICD-10-CM | POA: Insufficient documentation

## 2013-11-14 DIAGNOSIS — F329 Major depressive disorder, single episode, unspecified: Secondary | ICD-10-CM | POA: Insufficient documentation

## 2013-11-14 DIAGNOSIS — F3289 Other specified depressive episodes: Secondary | ICD-10-CM | POA: Insufficient documentation

## 2013-11-14 MED ORDER — METOCLOPRAMIDE HCL 5 MG/ML IJ SOLN
10.0000 mg | Freq: Once | INTRAMUSCULAR | Status: AC
  Administered 2013-11-14: 10 mg via INTRAVENOUS
  Filled 2013-11-14: qty 2

## 2013-11-14 MED ORDER — BUTALBITAL-APAP-CAFFEINE 50-325-40 MG PO TABS: 1.0000 | ORAL_TABLET | Freq: Four times a day (QID) | ORAL | Status: DC | PRN

## 2013-11-14 MED ORDER — SODIUM CHLORIDE 0.9 % IV BOLUS (SEPSIS)
1000.0000 mL | Freq: Once | INTRAVENOUS | Status: AC
  Administered 2013-11-14: 1000 mL via INTRAVENOUS

## 2013-11-14 MED ORDER — DIPHENHYDRAMINE HCL 50 MG/ML IJ SOLN
25.0000 mg | Freq: Once | INTRAMUSCULAR | Status: AC
  Administered 2013-11-14: 25 mg via INTRAVENOUS
  Filled 2013-11-14: qty 1

## 2013-11-14 MED ORDER — KETOROLAC TROMETHAMINE 30 MG/ML IJ SOLN
30.0000 mg | Freq: Once | INTRAMUSCULAR | Status: AC
  Administered 2013-11-14: 30 mg via INTRAVENOUS
  Filled 2013-11-14: qty 1

## 2013-11-14 MED ORDER — ONDANSETRON HCL 4 MG/2ML IJ SOLN
4.0000 mg | Freq: Once | INTRAMUSCULAR | Status: AC
  Administered 2013-11-14: 4 mg via INTRAVENOUS
  Filled 2013-11-14: qty 2

## 2013-11-14 MED ORDER — HYDROMORPHONE HCL PF 1 MG/ML IJ SOLN
1.0000 mg | Freq: Once | INTRAMUSCULAR | Status: AC
Start: 2013-11-14 — End: 2013-11-14
  Administered 2013-11-14: 1 mg via INTRAVENOUS
  Filled 2013-11-14: qty 1

## 2013-11-14 NOTE — ED Provider Notes (Signed)
CSN: [id number]     Arrival date & time 11/14/13  1935 History   First MD Initiated Contact with Patient 11/14/13 2002     Chief Complaint  Patient presents with  . Migraine     (Consider location/radiation/quality/duration/timing/severity/associated sxs/prior Treatment) HPI  68 year old female with history of migraine, arthritis and depression who presents complaining of headache. Patient reports she normally has 3-4 bouts of headache on a weekly basis with 1-2 bouts of major headache on a monthly basis and this has been ongoing throughout her life. She is experiencing worsening headache for the past 2 days. Describe headaches as a pressure sharp and throbbing sensation throughout her head and neck, persistent, with associated light and sound sensitivity, and feeling nauseous and vomited several times.  She felt that her headache could be coming from her sinus so she took an over-the-counter sinus and allergy medication with no relief. She also takes over-the-counter medication including Tylenol ibuprofen which has not helped.  She denies fever, chills, double vision, neck stiffness, chest pain, shortness of breath, productive cough, numbness or weakness. She denies any rash. She denies having any aura prior to having headache. Headache is gradual in onset and has been persistent.   Past Medical History  Diagnosis Date  . Arthritis   . Depression   . Migraines   . Allergy   . History of chicken pox   . History of colon polyps    Past Surgical History  Procedure Laterality Date  . Polypectomy    . Partial hysterectomy    . Colonoscopy  2005    last 2005   Family History  Problem Relation Age of Onset  . Heart disease Mother   . Heart disease Father   . Prostate cancer Father   . Diabetes Sister   . Stomach cancer Sister 64  . Diabetes Brother   . Heart disease Brother   . Rectal cancer Neg Hx   . Colon cancer Neg Hx    History  Substance Use Topics  . Smoking status:  Never Smoker   . Smokeless tobacco: Never Used  . Alcohol Use: No   OB History   Grav Para Term Preterm Abortions TAB SAB Ect Mult Living                 Review of Systems  All other systems reviewed and are negative.     Allergies  Review of patient's allergies indicates no known allergies.  Home Medications   Prior to Admission medications   Medication Sig Start Date End Date Taking? Authorizing Provider  acetaminophen (TYLENOL) 650 MG CR tablet Take 650 mg by mouth every 8 (eight) hours as needed for pain.    Historical Provider, MD  Calcium-Magnesium-Vitamin D (CALCIUM 1200+D3 PO) Take 2 capsules by mouth daily.    Historical Provider, MD  citalopram (CELEXA) 20 MG tablet Take 1 tablet (20 mg total) by mouth daily. 09/17/13   Webb Silversmith, NP  Ferrous Sulfate (RA IRON) 27 MG TABS Take 1 tablet by mouth as needed.    Historical Provider, MD  ibuprofen (ADVIL,MOTRIN) 200 MG tablet Take 200 mg by mouth every 6 (six) hours as needed for pain.    Historical Provider, MD  Multiple Vitamin (STRESS FORMULA 500/BIOTIN PO) Take 1 tablet by mouth daily.    Historical Provider, MD  omeprazole (PRILOSEC) 20 MG capsule Take 1 capsule (20 mg total) by mouth daily. 09/17/13   Webb Silversmith, NP  vitamin B-12 (CYANOCOBALAMIN) 1000 MCG tablet  Take 1,000 mcg by mouth daily.    Historical Provider, MD  vitamin E 400 UNIT capsule Take 400 Units by mouth daily.    Historical Provider, MD   BP 120/69  Pulse 88  Temp(Src) 98 F (36.7 C) (Oral)  Resp 20  Ht 5\' 4"  (1.626 m)  Wt 135 lb (61.236 kg)  BMI 23.16 kg/m2  SpO2 100% Physical Exam  Nursing note and vitals reviewed. Constitutional: She is oriented to person, place, and time. She appears well-developed and well-nourished. She appears distressed (Patient is tearful laying in bed, appears uncomfortable.).  HENT:  Head: Atraumatic.  Right Ear: External ear normal.  Left Ear: External ear normal.  Nose: Nose normal.  Mouth/Throat: Oropharynx  is clear and moist.  Eyes: Conjunctivae and EOM are normal. Pupils are equal, round, and reactive to light.  Neck: Normal range of motion. Neck supple.  No nuchal rigidity  Cardiovascular: Normal rate and regular rhythm.   Pulmonary/Chest: Effort normal and breath sounds normal.  Abdominal: Soft. There is no tenderness.  Musculoskeletal: She exhibits no edema.  Neurological: She is alert and oriented to person, place, and time.  Neurologic exam:  Speech clear, pupils equal round reactive to light, extraocular movements intact  Normal peripheral visual fields Cranial nerves III through XII normal including no facial droop Follows commands, moves all extremities x4, normal strength to bilateral upper and lower extremities at all major muscle groups including grip Sensation normal to light touch  Coordination intact, no limb ataxia, finger-nose-finger normal Rapid alternating movements normal No pronator drift Gait normal   Skin: No rash noted.  Psychiatric: She has a normal mood and affect.    ED Course  Procedures (including critical care time)  8:18 PM Headache similar to previous, no fever, neck stiffness, neuro findings or new symptoms to suggest more serious etiology.  I don't think SAH, ICH, meningitis, encephalitis, mass at this time.  No recent trauma.  I don't feel imaging necessary at this time.  Plan to control symptoms.   9:26 PM Pt has some improvement with migraine cocktail but still endorse headache and nausea.  Sts she has not had any work up for her headache.  Will obtain head CT and will continue with pain management.  Care discussed with Dr. Alvino Chapel.   11:44 PM Patient felt much better and is stable for discharge. We'll discharge with Fioricet and neurology followup. Head CT is unremarkable.  Labs Review Labs Reviewed - No data to display  Imaging Review Ct Head Wo Contrast  11/14/2013   CLINICAL DATA:  Migraine.  EXAM: CT HEAD WITHOUT CONTRAST  TECHNIQUE:  Contiguous axial images were obtained from the base of the skull through the vertex without intravenous contrast.  COMPARISON:  None.  FINDINGS: The ventricles and sulci are normal for age. No intraparenchymal hemorrhage, mass effect nor midline shift. Patchy supratentorial white matter hypodensities are within normal range for patient's age and though non-specific suggest sequelae of chronic small vessel ischemic disease. No acute large vascular territory infarcts.  No abnormal extra-axial fluid collections. Basal cisterns are patent. Trace calcific atherosclerosis of the carotid siphons.  No skull fracture. Possible, chronic appearing, mildly depressed right nasal bone fracture. The included ocular globes and orbital contents are non-suspicious. The mastoid aircells and included paranasal sinuses are well-aerated.  IMPRESSION: No acute intracranial process ; normal noncontrast CT of the head for age.   Electronically Signed   By: Elon Alas   On: 11/14/2013 22:46  EKG Interpretation None      MDM   Final diagnoses:  Migraine headache    BP 120/69  Pulse 88  Temp(Src) 98 F (36.7 C) (Oral)  Resp 20  Ht 5\' 4"  (1.626 m)  Wt 135 lb (61.236 kg)  BMI 23.16 kg/m2  SpO2 100%  I have reviewed nursing notes and vital signs. I personally reviewed the imaging tests through PACS system  I reviewed available ER/hospitalization records thought the EMR     Domenic Moras, [place] 11/14/13 2345

## 2013-11-14 NOTE — ED Provider Notes (Signed)
Medical screening examination/treatment/procedure(s) were performed by non-physician practitioner and as supervising physician I was immediately available for consultation/collaboration.   EKG Interpretation None       Jasper Riling. Alvino Chapel, MD 11/14/13 [phone]

## 2013-11-14 NOTE — Discharge Instructions (Signed)

## 2013-11-14 NOTE — ED Notes (Signed)
Pt arrived to the ED with a compliant of a migraine.  Pt states that she has a hx of migraines but has no prescriptions for said.  Pt states that the headache started a couple of days ago.  Pt is tearful in triage.  Pt states she took over the counter sinus and allergy medication with little relief.  Pt states located on her whole head and on her neck

## 2013-12-15 ENCOUNTER — Ambulatory Visit (INDEPENDENT_AMBULATORY_CARE_PROVIDER_SITE_OTHER): Payer: Commercial Managed Care - HMO | Admitting: Internal Medicine

## 2013-12-15 ENCOUNTER — Encounter: Payer: Self-pay | Admitting: Internal Medicine

## 2013-12-15 ENCOUNTER — Ambulatory Visit (INDEPENDENT_AMBULATORY_CARE_PROVIDER_SITE_OTHER)
Admission: RE | Admit: 2013-12-15 | Discharge: 2013-12-15 | Disposition: A | Discharge status: Home / Self Care | Payer: Commercial Managed Care - HMO | Source: Ambulatory Visit | Attending: Internal Medicine | Admitting: Internal Medicine

## 2013-12-15 VITALS — BP 122/68 | HR 74 | Temp 98.5°F | Resp 16 | Wt 134.0 lb

## 2013-12-15 DIAGNOSIS — F329 Major depressive disorder, single episode, unspecified: Secondary | ICD-10-CM

## 2013-12-15 DIAGNOSIS — R05 Cough: Secondary | ICD-10-CM

## 2013-12-15 DIAGNOSIS — R059 Cough, unspecified: Secondary | ICD-10-CM

## 2013-12-15 DIAGNOSIS — J209 Acute bronchitis, unspecified: Secondary | ICD-10-CM

## 2013-12-15 DIAGNOSIS — F32A Depression, unspecified: Secondary | ICD-10-CM

## 2013-12-15 DIAGNOSIS — K219 Gastro-esophageal reflux disease without esophagitis: Secondary | ICD-10-CM

## 2013-12-15 MED ORDER — CITALOPRAM HYDROBROMIDE 20 MG PO TABS: 20.0000 mg | ORAL_TABLET | Freq: Every day | ORAL | Status: DC

## 2013-12-15 MED ORDER — PROMETHAZINE-DM 6.25-15 MG/5ML PO SYRP: 5.0000 mL | ORAL_SOLUTION | Freq: Four times a day (QID) | ORAL | Status: DC | PRN

## 2013-12-15 MED ORDER — AZITHROMYCIN 500 MG PO TABS: 500.0000 mg | ORAL_TABLET | Freq: Every day | ORAL | Status: DC

## 2013-12-15 MED ORDER — OMEPRAZOLE 20 MG PO CPDR: 20.0000 mg | DELAYED_RELEASE_CAPSULE | Freq: Every day | ORAL | Status: DC

## 2013-12-15 NOTE — Progress Notes (Signed)
Subjective:    Patient ID: Charlene Patterson, female    DOB: April 16, 1946, 68 y.o.   MRN: [id number]  Cough This is a new problem. The current episode started in the past 7 days. The problem has been gradually worsening. The problem occurs every few hours. The cough is productive of purulent sputum. Associated symptoms include chills, postnasal drip, rhinorrhea and a sore throat. Pertinent negatives include no chest pain, ear congestion, ear pain, fever, headaches, heartburn, hemoptysis, myalgias, nasal congestion, rash, shortness of breath, sweats, weight loss or wheezing. She has tried OTC cough suppressant for the symptoms. The treatment provided mild relief. There is no history of asthma, bronchiectasis, bronchitis, COPD, emphysema, environmental allergies or pneumonia.      Review of Systems  Constitutional: Positive for chills. Negative for fever, weight loss, diaphoresis, activity change, appetite change and fatigue.  HENT: Positive for postnasal drip, rhinorrhea and sore throat. Negative for ear pain, sinus pressure, sneezing, tinnitus and trouble swallowing.   Eyes: Negative.   Respiratory: Positive for cough. Negative for apnea, hemoptysis, choking, chest tightness, shortness of breath, wheezing and stridor.   Cardiovascular: Negative.  Negative for chest pain, palpitations and leg swelling.  Gastrointestinal: Negative.  Negative for heartburn, nausea, vomiting, abdominal pain, diarrhea, constipation and blood in stool.  Endocrine: Negative.   Genitourinary: Negative.   Musculoskeletal: Negative.  Negative for arthralgias and myalgias.  Skin: Negative.  Negative for rash.  Allergic/Immunologic: Negative.  Negative for environmental allergies.  Neurological: Negative.  Negative for headaches.  Hematological: Negative.  Negative for adenopathy. Does not bruise/bleed easily.  Psychiatric/Behavioral: Negative.        Objective:   Physical Exam  Vitals reviewed. Constitutional: She  is oriented to person, place, and time. She appears well-developed and well-nourished.  Non-toxic appearance. She does not have a sickly appearance. She does not appear ill. No distress.  HENT:  Head: Normocephalic and atraumatic.  Mouth/Throat: Oropharynx is clear and moist. No oropharyngeal exudate.  Eyes: Conjunctivae are normal. Right eye exhibits no discharge. Left eye exhibits no discharge. No scleral icterus.  Neck: Normal range of motion. Neck supple. No JVD present. No tracheal deviation present. No thyromegaly present.  Cardiovascular: Normal rate, regular rhythm, normal heart sounds and intact distal pulses.  Exam reveals no gallop and no friction rub.   No murmur heard. Pulmonary/Chest: Effort normal and breath sounds normal. No stridor. No respiratory distress. She has no wheezes. She has no rales. She exhibits no tenderness.  Abdominal: Soft. Bowel sounds are normal. She exhibits no distension and no mass. There is no tenderness. There is no rebound and no guarding.  Musculoskeletal: Normal range of motion. She exhibits no edema and no tenderness.  Lymphadenopathy:    She has no cervical adenopathy.  Neurological: She is oriented to person, place, and time.  Skin: Skin is warm and dry. No rash noted. She is not diaphoretic. No erythema. No pallor.  Psychiatric: She has a normal mood and affect. Her behavior is normal. Judgment and thought content normal.     Lab Results  Component Value Date   WBC 4.9 12/13/2012   HGB 13.2 12/13/2012   HCT 38.9 12/13/2012   PLT 305.0 12/13/2012   GLUCOSE 83 12/13/2012   CHOL 256* 12/13/2012   TRIG 177.0* 12/13/2012   HDL 49.20 12/13/2012   LDLDIRECT 161.0 12/13/2012   ALT 18 12/13/2012   AST 20 12/13/2012   NA 139 12/13/2012   K 4.6 12/13/2012   CL 108 12/13/2012  CREATININE 1.0 12/13/2012   BUN 19 12/13/2012   CO2 26 12/13/2012   TSH 2.62 12/13/2012       Assessment & Plan:

## 2013-12-15 NOTE — Progress Notes (Signed)
Pre visit review using our clinic review tool, if applicable. No additional management support is needed unless otherwise documented below in the visit note. 

## 2013-12-15 NOTE — Patient Instructions (Signed)
Acute Bronchitis Bronchitis is inflammation of the airways that extend from the windpipe into the lungs (bronchi). The inflammation often causes mucus to develop. This leads to a cough, which is the most common symptom of bronchitis.  In acute bronchitis, the condition usually develops suddenly and goes away over time, usually in a couple weeks. Smoking, allergies, and asthma can make bronchitis worse. Repeated episodes of bronchitis may cause further lung problems.  CAUSES Acute bronchitis is most often caused by the same virus that causes a cold. The virus can spread from person to person (contagious).  SIGNS AND SYMPTOMS   Cough.   Fever.   Coughing up mucus.   Body aches.   Chest congestion.   Chills.   Shortness of breath.   Sore throat.  DIAGNOSIS  Acute bronchitis is usually diagnosed through a physical exam. Tests, such as chest X-rays, are sometimes done to rule out other conditions.  TREATMENT  Acute bronchitis usually goes away in a couple weeks. Often times, no medical treatment is necessary. Medicines are sometimes given for relief of fever or cough. Antibiotics are usually not needed but may be prescribed in certain situations. In some cases, an inhaler may be recommended to help reduce shortness of breath and control the cough. A cool mist vaporizer may also be used to help thin bronchial secretions and make it easier to clear the chest.  HOME CARE INSTRUCTIONS  Get plenty of rest.   Drink enough fluids to keep your urine clear or pale yellow (unless you have a medical condition that requires fluid restriction). Increasing fluids may help thin your secretions and will prevent dehydration.   Only take over-the-counter or prescription medicines as directed by your health care provider.   Avoid smoking and secondhand smoke. Exposure to cigarette smoke or irritating chemicals will make bronchitis worse. If you are a smoker, consider using nicotine gum or skin  patches to help control withdrawal symptoms. Quitting smoking will help your lungs heal faster.   Reduce the chances of another bout of acute bronchitis by washing your hands frequently, avoiding people with cold symptoms, and trying not to touch your hands to your mouth, nose, or eyes.   Follow up with your health care provider as directed.  SEEK MEDICAL CARE IF: Your symptoms do not improve after 1 week of treatment.  SEEK IMMEDIATE MEDICAL CARE IF:  You develop an increased fever or chills.   You have chest pain.   You have severe shortness of breath.  You have bloody sputum.   You develop dehydration.  You develop fainting.  You develop repeated vomiting.  You develop a severe headache. MAKE SURE YOU:   Understand these instructions.  Will watch your condition.  Will get help right away if you are not doing well or get worse. Document Released: 07/27/2004 Document Revised: 02/19/2013 Document Reviewed: 12/10/2012 Exit[employer] Patient Information 2014 [employer].  

## 2013-12-16 NOTE — Assessment & Plan Note (Signed)
I will treat the infection with zithromax and will control the cough with phenergan-dm

## 2013-12-16 NOTE — Assessment & Plan Note (Signed)
Her CXR is normal.

## 2014-04-14 ENCOUNTER — Ambulatory Visit (INDEPENDENT_AMBULATORY_CARE_PROVIDER_SITE_OTHER): Payer: Commercial Managed Care - HMO

## 2014-04-14 DIAGNOSIS — Z23 Encounter for immunization: Secondary | ICD-10-CM

## 2014-05-07 ENCOUNTER — Telehealth: Payer: Self-pay | Admitting: Internal Medicine

## 2014-05-07 DIAGNOSIS — Z1231 Encounter for screening mammogram for malignant neoplasm of breast: Secondary | ICD-10-CM

## 2014-05-07 NOTE — Telephone Encounter (Signed)
Pt requesting referral for mammography.

## 2014-05-10 DIAGNOSIS — Z1231 Encounter for screening mammogram for malignant neoplasm of breast: Secondary | ICD-10-CM | POA: Insufficient documentation

## 2014-05-10 NOTE — Telephone Encounter (Signed)
done

## 2014-05-12 ENCOUNTER — Encounter: Payer: Self-pay | Admitting: Internal Medicine

## 2014-05-12 ENCOUNTER — Ambulatory Visit (INDEPENDENT_AMBULATORY_CARE_PROVIDER_SITE_OTHER): Payer: Commercial Managed Care - HMO | Admitting: Internal Medicine

## 2014-05-12 ENCOUNTER — Ambulatory Visit (INDEPENDENT_AMBULATORY_CARE_PROVIDER_SITE_OTHER)
Admission: RE | Admit: 2014-05-12 | Discharge: 2014-05-12 | Disposition: A | Discharge status: Home / Self Care | Payer: Commercial Managed Care - HMO | Source: Ambulatory Visit | Attending: Internal Medicine | Admitting: Internal Medicine

## 2014-05-12 VITALS — BP 120/86 | HR 77 | Temp 98.1°F | Resp 16 | Ht 64.0 in | Wt 139.0 lb

## 2014-05-12 DIAGNOSIS — M25511 Pain in right shoulder: Secondary | ICD-10-CM

## 2014-05-12 DIAGNOSIS — R937 Abnormal findings on diagnostic imaging of other parts of musculoskeletal system: Secondary | ICD-10-CM | POA: Insufficient documentation

## 2014-05-12 DIAGNOSIS — M5442 Lumbago with sciatica, left side: Secondary | ICD-10-CM

## 2014-05-12 DIAGNOSIS — S22000A Wedge compression fracture of unspecified thoracic vertebra, initial encounter for closed fracture: Secondary | ICD-10-CM

## 2014-05-12 DIAGNOSIS — M542 Cervicalgia: Secondary | ICD-10-CM

## 2014-05-12 DIAGNOSIS — M899 Disorder of bone, unspecified: Secondary | ICD-10-CM

## 2014-05-12 DIAGNOSIS — M25519 Pain in unspecified shoulder: Secondary | ICD-10-CM | POA: Insufficient documentation

## 2014-05-12 DIAGNOSIS — M8580 Other specified disorders of bone density and structure, unspecified site: Secondary | ICD-10-CM

## 2014-05-12 DIAGNOSIS — M5412 Radiculopathy, cervical region: Secondary | ICD-10-CM | POA: Insufficient documentation

## 2014-05-12 NOTE — Progress Notes (Signed)
Pre visit review using our clinic review tool, if applicable. No additional management support is needed unless otherwise documented below in the visit note. 

## 2014-05-12 NOTE — Patient Instructions (Signed)
Back Pain, Adult Low back pain is very common. About 1 in 5 people have back pain.The cause of low back pain is rarely dangerous. The pain often gets better over time.About half of people with a sudden onset of back pain feel better in just 2 weeks. About 8 in 10 people feel better by 6 weeks.  CAUSES Some common causes of back pain include:  Strain of the muscles or ligaments supporting the spine.  Wear and tear (degeneration) of the spinal discs.  Arthritis.  Direct injury to the back. DIAGNOSIS Most of the time, the direct cause of low back pain is not known.However, back pain can be treated effectively even when the exact cause of the pain is unknown.Answering your caregiver's questions about your overall health and symptoms is one of the most accurate ways to make sure the cause of your pain is not dangerous. If your caregiver needs more information, he or she may order lab work or imaging tests (X-rays or MRIs).However, even if imaging tests show changes in your back, this usually does not require surgery. HOME CARE INSTRUCTIONS For many people, back pain returns.Since low back pain is rarely dangerous, it is often a condition that people can learn to manageon their own.   Remain active. It is stressful on the back to sit or stand in one place. Do not sit, drive, or stand in one place for more than 30 minutes at a time. Take short walks on level surfaces as soon as pain allows.Try to increase the length of time you walk each day.  Do not stay in bed.Resting more than 1 or 2 days can delay your recovery.  Do not avoid exercise or work.Your body is made to move.It is not dangerous to be active, even though your back may hurt.Your back will likely heal faster if you return to being active before your pain is gone.  Pay attention to your body when you bend and lift. Many people have less discomfortwhen lifting if they bend their knees, keep the load close to their bodies,and  avoid twisting. Often, the most comfortable positions are those that put less stress on your recovering back.  Find a comfortable position to sleep. Use a firm mattress and lie on your side with your knees slightly bent. If you lie on your back, put a pillow under your knees.  Only take over-the-counter or prescription medicines as directed by your caregiver. Over-the-counter medicines to reduce pain and inflammation are often the most helpful.Your caregiver may prescribe muscle relaxant drugs.These medicines help dull your pain so you can more quickly return to your normal activities and healthy exercise.  Put ice on the injured area.  Put ice in a plastic bag.  Place a towel between your skin and the bag.  Leave the ice on for 15-20 minutes, 03-04 times a day for the first 2 to 3 days. After that, ice and heat may be alternated to reduce pain and spasms.  Ask your caregiver about trying back exercises and gentle massage. This may be of some benefit.  Avoid feeling anxious or stressed.Stress increases muscle tension and can worsen back pain.It is important to recognize when you are anxious or stressed and learn ways to manage it.Exercise is a great option. SEEK MEDICAL CARE IF:  You have pain that is not relieved with rest or medicine.  You have pain that does not improve in 1 week.  You have new symptoms.  You are generally not feeling well. SEEK   IMMEDIATE MEDICAL CARE IF:   You have pain that radiates from your back into your legs.  You develop new bowel or bladder control problems.  You have unusual weakness or numbness in your arms or legs.  You develop nausea or vomiting.  You develop abdominal pain.  You feel faint. Document Released: 06/19/2005 Document Revised: 12/19/2011 Document Reviewed: 10/21/2013 [employer] Patient Information 2015 [employer]. This information is not intended to replace advice given to you by your health care provider. Make sure you  discuss any questions you have with your health care provider.  

## 2014-05-14 NOTE — Assessment & Plan Note (Signed)
She has a new fracture at T12 Will get an MRI done to see if there is spinal cord or nerve involvement

## 2014-05-14 NOTE — Assessment & Plan Note (Signed)
Her shoulder exam and xrays are normal I think this pain is referred from the cervical spine

## 2014-05-14 NOTE — Progress Notes (Signed)
Subjective:    Patient ID: Charlene Patterson, female    DOB: Aug 29, 1945, 68 y.o.   MRN: [id number]  Back Pain This is a recurrent problem. The current episode started 1 to 4 weeks ago. The problem has been gradually worsening since onset. The pain is present in the lumbar spine. The quality of the pain is described as aching. The pain radiates to the left thigh. The pain is at a severity of 4/10. The pain is mild. The pain is worse during the day. The symptoms are aggravated by bending, position and standing. Pertinent negatives include no abdominal pain, bladder incontinence, bowel incontinence, chest pain, dysuria, fever, headaches, leg pain, numbness, paresis, paresthesias, pelvic pain, perianal numbness, tingling, weakness or weight loss. Risk factors include history of osteoporosis. She has tried NSAIDs and analgesics for the symptoms. The treatment provided moderate relief.      Review of Systems  Constitutional: Negative.  Negative for fever, chills, weight loss, diaphoresis, appetite change and fatigue.  HENT: Negative.   Eyes: Negative.   Respiratory: Negative.  Negative for cough, choking, chest tightness, shortness of breath and stridor.   Cardiovascular: Negative.  Negative for chest pain, palpitations and leg swelling.  Gastrointestinal: Negative.  Negative for nausea, vomiting, abdominal pain, diarrhea, constipation, blood in stool and bowel incontinence.  Endocrine: Negative.   Genitourinary: Negative.  Negative for bladder incontinence, dysuria and pelvic pain.  Musculoskeletal: Positive for back pain, arthralgias (rt shoulder pain) and neck pain. Negative for myalgias, joint swelling, gait problem and neck stiffness.  Skin: Negative.  Negative for rash.  Allergic/Immunologic: Negative.   Neurological: Negative.  Negative for dizziness, tingling, seizures, weakness, light-headedness, numbness, headaches and paresthesias.  Hematological: Negative.  Negative for adenopathy. Does  not bruise/bleed easily.  Psychiatric/Behavioral: Negative.        Objective:   Physical Exam  Constitutional: She is oriented to person, place, and time. She appears well-developed and well-nourished. No distress.  HENT:  Head: Normocephalic and atraumatic.  Mouth/Throat: Oropharynx is clear and moist. No oropharyngeal exudate.  Eyes: Conjunctivae are normal. Right eye exhibits no discharge. Left eye exhibits no discharge. No scleral icterus.  Neck: Normal range of motion. Neck supple. No JVD present. No tracheal deviation present. No thyromegaly present.  Cardiovascular: Normal rate, regular rhythm, normal heart sounds and intact distal pulses.  Exam reveals no gallop and no friction rub.   No murmur heard. Pulmonary/Chest: Effort normal and breath sounds normal. No stridor. No respiratory distress. She has no wheezes. She has no rales. She exhibits no tenderness.  Abdominal: Soft. Bowel sounds are normal. She exhibits no distension and no mass. There is no tenderness. There is no rebound and no guarding.  Musculoskeletal: Normal range of motion. She exhibits no edema or tenderness.       Cervical back: Normal.       Lumbar back: Normal.  Lymphadenopathy:    She has no cervical adenopathy.  Neurological: She is alert and oriented to person, place, and time. She has normal strength. She displays no atrophy, no tremor and normal reflexes. No cranial nerve deficit or sensory deficit. She exhibits normal muscle tone. She displays a negative Romberg sign. She displays no seizure activity. Coordination and gait normal.  Reflex Scores:      Tricep reflexes are 1+ on the right side and 1+ on the left side.      Bicep reflexes are 1+ on the right side and 1+ on the left side.  Brachioradialis reflexes are 1+ on the right side and 1+ on the left side.      Patellar reflexes are 1+ on the right side and 1+ on the left side.      Achilles reflexes are 1+ on the right side and 1+ on the left  side. Neg SLR in BLE  Skin: Skin is warm and dry. No rash noted. She is not diaphoretic. No erythema. No pallor.  Psychiatric: She has a normal mood and affect. Her behavior is normal. Judgment and thought content normal.  Vitals reviewed.    Lab Results  Component Value Date   WBC 4.9 12/13/2012   HGB 13.2 12/13/2012   HCT 38.9 12/13/2012   PLT 305.0 12/13/2012   GLUCOSE 83 12/13/2012   CHOL 256* 12/13/2012   TRIG 177.0* 12/13/2012   HDL 49.20 12/13/2012   LDLDIRECT 161.0 12/13/2012   ALT 18 12/13/2012   AST 20 12/13/2012   NA 139 12/13/2012   K 4.6 12/13/2012   CL 108 12/13/2012   CREATININE 1.0 12/13/2012   BUN 19 12/13/2012   CO2 26 12/13/2012   TSH 2.62 12/13/2012       Assessment & Plan:

## 2014-05-14 NOTE — Assessment & Plan Note (Signed)
Will get an MRI done to see if there is spinal stenosis, DDD, nerve impingement

## 2014-05-14 NOTE — Assessment & Plan Note (Signed)
She will cont the current meds for pain relief Will get an MRI done to see how much loss of height there is

## 2014-05-25 ENCOUNTER — Emergency Department (HOSPITAL_COMMUNITY): Payer: Medicare PPO

## 2014-05-25 ENCOUNTER — Encounter (HOSPITAL_COMMUNITY): Payer: Self-pay | Admitting: Family Medicine

## 2014-05-25 ENCOUNTER — Inpatient Hospital Stay (HOSPITAL_COMMUNITY)
Admission: EM | Admit: 2014-05-25 | Discharge: 2014-05-27 | DRG: 392 | Disposition: A | Discharge status: Home / Self Care | Payer: Medicare PPO | Attending: Internal Medicine | Admitting: Internal Medicine

## 2014-05-25 DIAGNOSIS — R109 Unspecified abdominal pain: Secondary | ICD-10-CM | POA: Diagnosis not present

## 2014-05-25 DIAGNOSIS — G43909 Migraine, unspecified, not intractable, without status migrainosus: Secondary | ICD-10-CM | POA: Diagnosis present

## 2014-05-25 DIAGNOSIS — J309 Allergic rhinitis, unspecified: Secondary | ICD-10-CM | POA: Diagnosis present

## 2014-05-25 DIAGNOSIS — F32A Depression, unspecified: Secondary | ICD-10-CM | POA: Diagnosis present

## 2014-05-25 DIAGNOSIS — F450 Somatization disorder: Secondary | ICD-10-CM

## 2014-05-25 DIAGNOSIS — F329 Major depressive disorder, single episode, unspecified: Secondary | ICD-10-CM | POA: Diagnosis present

## 2014-05-25 DIAGNOSIS — M545 Low back pain: Secondary | ICD-10-CM

## 2014-05-25 DIAGNOSIS — K921 Melena: Secondary | ICD-10-CM

## 2014-05-25 DIAGNOSIS — K529 Noninfective gastroenteritis and colitis, unspecified: Secondary | ICD-10-CM | POA: Diagnosis present

## 2014-05-25 DIAGNOSIS — K922 Gastrointestinal hemorrhage, unspecified: Secondary | ICD-10-CM | POA: Diagnosis present

## 2014-05-25 DIAGNOSIS — G8929 Other chronic pain: Secondary | ICD-10-CM | POA: Diagnosis present

## 2014-05-25 DIAGNOSIS — R112 Nausea with vomiting, unspecified: Secondary | ICD-10-CM

## 2014-05-25 DIAGNOSIS — M5430 Sciatica, unspecified side: Secondary | ICD-10-CM | POA: Diagnosis present

## 2014-05-25 DIAGNOSIS — M1990 Unspecified osteoarthritis, unspecified site: Secondary | ICD-10-CM | POA: Diagnosis present

## 2014-05-25 DIAGNOSIS — J302 Other seasonal allergic rhinitis: Secondary | ICD-10-CM

## 2014-05-25 DIAGNOSIS — K219 Gastro-esophageal reflux disease without esophagitis: Secondary | ICD-10-CM

## 2014-05-25 DIAGNOSIS — M48061 Spinal stenosis, lumbar region without neurogenic claudication: Secondary | ICD-10-CM

## 2014-05-25 DIAGNOSIS — M5416 Radiculopathy, lumbar region: Secondary | ICD-10-CM

## 2014-05-25 DIAGNOSIS — F339 Major depressive disorder, recurrent, unspecified: Secondary | ICD-10-CM | POA: Diagnosis present

## 2014-05-25 DIAGNOSIS — R197 Diarrhea, unspecified: Secondary | ICD-10-CM

## 2014-05-25 HISTORY — DX: Wedge compression fracture of unspecified thoracic vertebra, initial encounter for closed fracture: S22.000A

## 2014-05-25 HISTORY — DX: Gastro-esophageal reflux disease without esophagitis: K21.9

## 2014-05-25 HISTORY — DX: Personal history of other diseases of the digestive system: Z87.19

## 2014-05-25 HISTORY — DX: Gastrointestinal hemorrhage, unspecified: K92.2

## 2014-05-25 HISTORY — DX: Anxiety disorder, unspecified: F41.9

## 2014-05-25 HISTORY — DX: Sciatica, unspecified side: M54.30

## 2014-05-25 HISTORY — DX: Other chronic pain: G89.29

## 2014-05-25 HISTORY — DX: Noninfective gastroenteritis and colitis, unspecified: K52.9

## 2014-05-25 HISTORY — DX: Dorsalgia, unspecified: M54.9

## 2014-05-25 HISTORY — DX: Cervicalgia: M54.2

## 2014-05-25 HISTORY — DX: Allergic rhinitis, unspecified: J30.9

## 2014-05-25 HISTORY — DX: Spinal stenosis, lumbar region without neurogenic claudication: M48.061

## 2014-05-25 LAB — CBC
HEMATOCRIT: 39.8 % (ref 36.0–46.0)
Hemoglobin: 13.5 g/dL (ref 12.0–15.0)
MCH: 28.4 pg (ref 26.0–34.0)
MCHC: 33.9 g/dL (ref 30.0–36.0)
MCV: 83.8 fL (ref 78.0–100.0)
Platelets: 276 10*3/uL (ref 150–400)
RBC: 4.75 MIL/uL (ref 3.87–5.11)
RDW: 13.4 % (ref 11.5–15.5)
WBC: 6.5 10*3/uL (ref 4.0–10.5)

## 2014-05-25 LAB — COMPREHENSIVE METABOLIC PANEL
ALK PHOS: 161 U/L — AB (ref 39–117)
ALT: 18 U/L (ref 0–35)
ANION GAP: 14 (ref 5–15)
AST: 22 U/L (ref 0–37)
Albumin: 4.1 g/dL (ref 3.5–5.2)
BILIRUBIN TOTAL: 0.3 mg/dL (ref 0.3–1.2)
BUN: 17 mg/dL (ref 6–23)
CHLORIDE: 104 meq/L (ref 96–112)
CO2: 22 mEq/L (ref 19–32)
Calcium: 9.7 mg/dL (ref 8.4–10.5)
Creatinine, Ser: 0.9 mg/dL (ref 0.50–1.10)
GFR calc non Af Amer: 65 mL/min — ABNORMAL LOW (ref >90)
GFR, EST AFRICAN AMERICAN: 75 mL/min — AB (ref >90)
GLUCOSE: 97 mg/dL (ref 70–99)
POTASSIUM: 3.7 meq/L (ref 3.7–5.3)
Sodium: 140 mEq/L (ref 137–147)
Total Protein: 7.5 g/dL (ref 6.0–8.3)

## 2014-05-25 LAB — PHOSPHORUS: Phosphorus: 3 mg/dL (ref 2.3–4.6)

## 2014-05-25 LAB — TYPE AND SCREEN
ABO/RH(D): B POS
Antibody Screen: NEGATIVE

## 2014-05-25 LAB — LIPASE, BLOOD: Lipase: 65 U/L — ABNORMAL HIGH (ref 11–59)

## 2014-05-25 LAB — CLOSTRIDIUM DIFFICILE BY PCR: CDIFFPCR: NEGATIVE

## 2014-05-25 LAB — TSH: TSH: 2.39 u[IU]/mL (ref 0.350–4.500)

## 2014-05-25 LAB — POC OCCULT BLOOD, ED: Fecal Occult Bld: POSITIVE — AB

## 2014-05-25 LAB — MAGNESIUM: Magnesium: 2.1 mg/dL (ref 1.5–2.5)

## 2014-05-25 LAB — ABO/RH: ABO/RH(D): B POS

## 2014-05-25 MED ORDER — PANTOPRAZOLE SODIUM 40 MG PO TBEC
40.0000 mg | DELAYED_RELEASE_TABLET | Freq: Every day | ORAL | Status: DC
  Administered 2014-05-26 – 2014-05-27 (×2): 40 mg via ORAL
  Filled 2014-05-25 (×2): qty 1

## 2014-05-25 MED ORDER — ACETAMINOPHEN ER 650 MG PO TBCR: 650.0000 mg | EXTENDED_RELEASE_TABLET | Freq: Four times a day (QID) | ORAL | Status: DC | PRN

## 2014-05-25 MED ORDER — IOHEXOL 300 MG/ML  SOLN
25.0000 mL | INTRAMUSCULAR | Status: AC
  Administered 2014-05-25: 25 mL via ORAL

## 2014-05-25 MED ORDER — STRESS FORMULA 500/BIOTIN PO TABS: 1.0000 | ORAL_TABLET | Freq: Every day | ORAL | Status: DC

## 2014-05-25 MED ORDER — ACETAMINOPHEN 325 MG PO TABS
650.0000 mg | ORAL_TABLET | Freq: Four times a day (QID) | ORAL | Status: DC | PRN
  Administered 2014-05-26: 650 mg via ORAL
  Filled 2014-05-25: qty 2

## 2014-05-25 MED ORDER — IOHEXOL 300 MG/ML  SOLN
100.0000 mL | Freq: Once | INTRAMUSCULAR | Status: AC | PRN
  Administered 2014-05-25: 100 mL via INTRAVENOUS

## 2014-05-25 MED ORDER — METRONIDAZOLE IN NACL 5-0.79 MG/ML-% IV SOLN
500.0000 mg | Freq: Once | INTRAVENOUS | Status: AC
  Administered 2014-05-25: 500 mg via INTRAVENOUS
  Filled 2014-05-25: qty 100

## 2014-05-25 MED ORDER — METRONIDAZOLE IN NACL 5-0.79 MG/ML-% IV SOLN
500.0000 mg | Freq: Three times a day (TID) | INTRAVENOUS | Status: DC
  Administered 2014-05-25 – 2014-05-27 (×5): 500 mg via INTRAVENOUS
  Filled 2014-05-25 (×6): qty 100

## 2014-05-25 MED ORDER — VITAMIN B-12 1000 MCG PO TABS
1000.0000 ug | ORAL_TABLET | Freq: Every day | ORAL | Status: DC
  Administered 2014-05-26 – 2014-05-27 (×2): 1000 ug via ORAL
  Filled 2014-05-25 (×2): qty 1

## 2014-05-25 MED ORDER — ONDANSETRON HCL 4 MG/2ML IJ SOLN: 4.0000 mg | Freq: Three times a day (TID) | INTRAMUSCULAR | Status: DC | PRN

## 2014-05-25 MED ORDER — ONDANSETRON HCL 4 MG/2ML IJ SOLN
4.0000 mg | Freq: Four times a day (QID) | INTRAMUSCULAR | Status: DC | PRN
  Administered 2014-05-26: 4 mg via INTRAVENOUS
  Filled 2014-05-25: qty 2

## 2014-05-25 MED ORDER — CIPROFLOXACIN IN D5W 400 MG/200ML IV SOLN
400.0000 mg | Freq: Once | INTRAVENOUS | Status: AC
  Administered 2014-05-25: 400 mg via INTRAVENOUS
  Filled 2014-05-25: qty 200

## 2014-05-25 MED ORDER — VITAMIN E 180 MG (400 UNIT) PO CAPS
400.0000 [IU] | ORAL_CAPSULE | Freq: Every day | ORAL | Status: DC
  Administered 2014-05-26 – 2014-05-27 (×2): 400 [IU] via ORAL
  Filled 2014-05-25 (×2): qty 1

## 2014-05-25 MED ORDER — LORATADINE 10 MG PO TABS: 10.0000 mg | ORAL_TABLET | Freq: Every evening | ORAL | Status: DC | PRN

## 2014-05-25 MED ORDER — SODIUM CHLORIDE 0.9 % IV SOLN
INTRAVENOUS | Status: DC
  Administered 2014-05-25 – 2014-05-26 (×2): via INTRAVENOUS

## 2014-05-25 MED ORDER — CITALOPRAM HYDROBROMIDE 20 MG PO TABS
20.0000 mg | ORAL_TABLET | Freq: Every day | ORAL | Status: DC
  Administered 2014-05-26 – 2014-05-27 (×2): 20 mg via ORAL
  Filled 2014-05-25 (×2): qty 1

## 2014-05-25 MED ORDER — CIPROFLOXACIN IN D5W 400 MG/200ML IV SOLN
400.0000 mg | Freq: Two times a day (BID) | INTRAVENOUS | Status: DC
  Administered 2014-05-26 – 2014-05-27 (×3): 400 mg via INTRAVENOUS
  Filled 2014-05-25 (×4): qty 200

## 2014-05-25 MED ORDER — OXYCODONE HCL 5 MG PO TABS
5.0000 mg | ORAL_TABLET | ORAL | Status: DC | PRN
  Administered 2014-05-26: 5 mg via ORAL
  Filled 2014-05-25: qty 1

## 2014-05-25 MED ORDER — MORPHINE SULFATE 2 MG/ML IJ SOLN: 2.0000 mg | INTRAMUSCULAR | Status: DC | PRN

## 2014-05-25 MED ORDER — ONDANSETRON HCL 4 MG PO TABS: 4.0000 mg | ORAL_TABLET | Freq: Four times a day (QID) | ORAL | Status: DC | PRN

## 2014-05-25 MED ORDER — SACCHAROMYCES BOULARDII 250 MG PO CAPS
250.0000 mg | ORAL_CAPSULE | Freq: Two times a day (BID) | ORAL | Status: DC
  Administered 2014-05-25 – 2014-05-27 (×4): 250 mg via ORAL
  Filled 2014-05-25 (×5): qty 1

## 2014-05-25 NOTE — ED Provider Notes (Signed)
CSN: [id number]     Arrival date & time 05/25/14  1217 History   First MD Initiated Contact with Patient 05/25/14 1234     Chief Complaint  Patient presents with  . Rectal Bleeding  . Abdominal Pain     HPI Pt was seen at 1240. Per pt, c/o gradual onset and persistence of multiple intermittent episodes of N/V/D that began overnight last night. Has been associated with generalized abd "cramping" pain. Describes the stools as "watery" then "bloody." States she has been "only passing blood" since this morning. Denies CP/SOB, no back pain, no fevers, no black or blood in emesis, no syncope/near syncope. Pt states she has hx of similar symptoms last month but did not seek medical advice/treatment.      Past Medical History  Diagnosis Date  . Arthritis   . Depression   . Migraines   . Allergy   . History of chicken pox   . History of colon polyps   . Chronic neck pain   . Chronic back pain   . Sciatica   . Thoracic compression fracture    Past Surgical History  Procedure Laterality Date  . Polypectomy    . Partial hysterectomy    . Colonoscopy  2005    last 2005   Family History  Problem Relation Age of Onset  . Heart disease Mother   . Heart disease Father   . Prostate cancer Father   . Diabetes Sister   . Stomach cancer Sister 51  . Diabetes Brother   . Heart disease Brother   . Rectal cancer Neg Hx   . Colon cancer Neg Hx    History  Substance Use Topics  . Smoking status: Never Smoker   . Smokeless tobacco: Never Used  . Alcohol Use: No    Review of Systems ROS: Statement: All systems negative except as marked or noted in the HPI; Constitutional: Negative for fever and chills. ; ; Eyes: Negative for eye pain, redness and discharge. ; ; ENMT: Negative for ear pain, hoarseness, nasal congestion, sinus pressure and sore throat. ; ; Cardiovascular: Negative for chest pain, palpitations, diaphoresis, dyspnea and peripheral edema. ; ; Respiratory: Negative for cough,  wheezing and stridor. ; ; Gastrointestinal: +nausea, vomiting, diarrhea, abdominal pain, blood in stool. Negative for hematemesis, jaundice. ; ; Genitourinary: Negative for dysuria, flank pain and hematuria. ; ; Musculoskeletal: Negative for back pain and neck pain. Negative for swelling and trauma.; ; Skin: Negative for pruritus, rash, abrasions, blisters, bruising and skin lesion.; ; Neuro: Negative for headache, lightheadedness and neck stiffness. Negative for weakness, altered level of consciousness , altered mental status, extremity weakness, paresthesias, involuntary movement, seizure and syncope.      Allergies  Review of patient's allergies indicates no known allergies.  Home Medications   Prior to Admission medications   Medication Sig Start Date End Date Taking? Authorizing Provider  acetaminophen (TYLENOL) 650 MG CR tablet Take 650 mg by mouth every 8 (eight) hours as needed for pain.    Historical Provider, MD  Calcium-Magnesium-Vitamin D (CALCIUM 1200+D3 PO) Take 2 capsules by mouth daily.    Historical Provider, MD  citalopram (CELEXA) 20 MG tablet Take 1 tablet (20 mg total) by mouth daily. 12/15/13   Janith Lima, MD  Ferrous Sulfate (RA IRON) 27 MG TABS Take 1 tablet by mouth as needed.    Historical Provider, MD  ibuprofen (ADVIL,MOTRIN) 200 MG tablet Take 200 mg by mouth every 6 (six) hours as  needed for pain.    Historical Provider, MD  Multiple Vitamin (STRESS FORMULA 500/BIOTIN PO) Take 1 tablet by mouth daily.    Historical Provider, MD  omeprazole (PRILOSEC) 20 MG capsule Take 1 capsule (20 mg total) by mouth daily. 12/15/13   Janith Lima, MD  vitamin B-12 (CYANOCOBALAMIN) 1000 MCG tablet Take 1,000 mcg by mouth daily.    Historical Provider, MD  vitamin E 400 UNIT capsule Take 400 Units by mouth daily.    Historical Provider, MD   BP 138/79 mmHg  Pulse 86  Temp(Src) 98.3 F (36.8 C)  Resp 18  SpO2 100% Physical Exam  1245: Physical examination:  Nursing notes  reviewed; Vital signs and O2 SAT reviewed;  Constitutional: Well developed, Well nourished, Well hydrated, In no acute distress; Head:  Normocephalic, atraumatic; Eyes: EOMI, PERRL, No scleral icterus; ENMT: Mouth and pharynx normal, Mucous membranes moist; Neck: Supple, Full range of motion, No lymphadenopathy; Cardiovascular: Regular rate and rhythm, No gallop; Respiratory: Breath sounds clear & equal bilaterally, No rales, rhonchi, wheezes.  Speaking full sentences with ease, Normal respiratory effort/excursion; Chest: Nontender, Movement normal; Abdomen: Soft, Nontender, Nondistended, Normal bowel sounds; Genitourinary: No CVA tenderness; Extremities: Pulses normal, No tenderness, No edema, No calf edema or asymmetry. Rectal exam performed w/permission of pt and ED RN chaperone present.  Anal tone normal.  Non-tender, soft brown stool in rectal vault, heme positive.  No fissures, +external hemorrhoid without thrombosis or bleeding. No palp masses.; Neuro: AA&Ox3, Major CN grossly intact.  Speech clear. No gross focal motor or sensory deficits in extremities.; Skin: Color normal, Warm, Dry.   ED Course  Procedures     EKG Interpretation None      MDM  MDM Reviewed: previous chart, nursing note and vitals Reviewed previous: labs Interpretation: labs and CT scan     Results for orders placed or performed during the hospital encounter of 05/25/14  Clostridium Difficile by PCR  Result Value Ref Range   C difficile by pcr NEGATIVE NEGATIVE  CBC  Result Value Ref Range   WBC 6.5 4.0 - 10.5 K/uL   RBC 4.75 3.87 - 5.11 MIL/uL   Hemoglobin 13.5 12.0 - 15.0 g/dL   HCT 39.8 36.0 - 46.0 %   MCV 83.8 78.0 - 100.0 fL   MCH 28.4 26.0 - 34.0 pg   MCHC 33.9 30.0 - 36.0 g/dL   RDW 13.4 11.5 - 15.5 %   Platelets 276 150 - 400 K/uL  Comprehensive metabolic panel  Result Value Ref Range   Sodium 140 137 - 147 mEq/L   Potassium 3.7 3.7 - 5.3 mEq/L   Chloride 104 96 - 112 mEq/L   CO2 22 19 - 32  mEq/L   Glucose, Bld 97 70 - 99 mg/dL   BUN 17 6 - 23 mg/dL   Creatinine, Ser 0.90 0.50 - 1.10 mg/dL   Calcium 9.7 8.4 - 10.5 mg/dL   Total Protein 7.5 6.0 - 8.3 g/dL   Albumin 4.1 3.5 - 5.2 g/dL   AST 22 0 - 37 U/L   ALT 18 0 - 35 U/L   Alkaline Phosphatase 161 (H) 39 - 117 U/L   Total Bilirubin 0.3 0.3 - 1.2 mg/dL   GFR calc non Af Amer 65 (L) >90 mL/min   GFR calc Af Amer 75 (L) >90 mL/min   Anion gap 14 5 - 15  Lipase, blood  Result Value Ref Range   Lipase 65 (H) 11 - 59 U/L  POC occult blood, ED  Result Value Ref Range   Fecal Occult Bld POSITIVE (A) NEGATIVE  Type and screen  Result Value Ref Range   ABO/RH(D) B POS    Antibody Screen NEG    Sample Expiration 05/28/2014   ABO/Rh  Result Value Ref Range   ABO/RH(D) PENDING    Ct Abdomen Pelvis W Contrast 05/25/2014   CLINICAL DATA:  Lower abdominal pain with nausea, vomiting, and diarrhea. Blood noted in stool.  EXAM: CT ABDOMEN AND PELVIS WITH CONTRAST  TECHNIQUE: Multidetector CT imaging of the abdomen and pelvis was performed using the standard protocol following bolus administration of intravenous contrast. Oral contrast was also administered.  CONTRAST:  13mL OMNIPAQUE IOHEXOL 300 MG/ML  SOLN  COMPARISON:  None.  FINDINGS: There is atelectatic change in the right base posteriorly. Lung bases are otherwise clear. There is a focal hiatal hernia present.  There is hepatic steatosis. No focal liver lesions are identified. Gallbladder wall is not thickened. There is no appreciable biliary duct dilatation.  Spleen, pancreas, and adrenals appear normal. Kidneys bilaterally show no evidence of hydronephrosis on either side. There is an 8 x 5 mm angiomyolipoma in the upper pole the right kidney. No other renal mass is appreciated. There is no renal or ureteral calculus on either side.  In the pelvis, the urinary bladder is midline with normal wall thickness. Uterus is absent. There is a cyst in the right ovary measuring 2.1 x 1.6 cm.  There is no other appreciable pelvic mass. The terminal ileum appears normal. Appendix appears normal.  There is no bowel obstruction.  No free air or portal venous air.  There is thickening of the wall of the colon in the distal transverse colon region as well as throughout much of the descending colon to the level of the sigmoid -descending colon junction. There is no appreciable wall thickness in the sigmoid colon itself. The remainder of the colon appears unremarkable.  There is no ascites, adenopathy, or abscess in the abdomen or pelvis. There is atherosclerotic change in the aorta but no apparent aneurysm. There is degenerative change in the lumbar spine. There are no lytic bone lesions. There is a sclerotic focus in the right sacral ala, statistically most likely a bone island.  IMPRESSION: Evidence of colitis involving the distal transverse colon as well as most of the descending colon. There is no appreciable surrounding mesenteric thickening. No abscess. No diverticulitis appreciable.  No bowel obstruction. No abscess. Appendix appears normal. No renal or ureteral calculus. Uterus absent.  Small right ovarian cyst. Statistically, this small cyst is most likely benign. It would be advisable to consider a pelvic ultrasound in approximately 1 year to confirm stability given this finding, however. This recommendation follows ACR consensus guidelines: White Paper of the ACR Incidental Findings Committee II on Adnexal Findings. J Am Coll Radiol [phone].  Small angiomyolipoma upper pole right kidney, a benign finding.   Electronically Signed   By: Lowella Grip M.D.   On: 05/25/2014 15:17    1535:  Pt has passed bloody stool while in the ED: cdiff negative, GI pathogen panel pending. CT scan with colitis; will start IV cipro and flagyl. Dx and testing d/w pt.  Questions answered.  Verb understanding, agreeable to admit. T/C to [facility] Dr. Dyann Kief, case discussed, including:  HPI, pertinent PM/SHx,  VS/PE, dx testing, ED course and treatment:  Agreeable to admit, requests to write temporary orders, allow liquid diet, obtain inpt medical bed to team MCAdmits.  Francine Graven, DO 05/28/14 1348

## 2014-05-25 NOTE — ED Notes (Signed)
Per pt sts that this am about 1:30 she started N,V,D. sts abdominal cramping and rectal bleeding. sts large amount of blood in the toilet.

## 2014-05-25 NOTE — ED Notes (Signed)
MD at bedside. 

## 2014-05-25 NOTE — H&P (Signed)
[facility] History and Physical  [name] MPN:[id number] DOB: 01/14/1946 DOA: 05/25/2014  Referring physician: Dr. Thurnell Garbe  PCP: Scarlette Calico, MD   Chief Complaint: abd pain, nausea, vomiting and diarrhea  HPI: Charlene Patterson is a 68 y.o. female with PMH significant for depression, allergic rhinitis and chronic back pain; who presented to ED complaining of nausea, vomiting and abd pain. Patient reports symptoms present for the last 36 hours or so prior to admission and have been worsening. She denies any CP, SOB, dysuria, hematemesis, HA's, blurred vision or any other complaints. She also denies fever, but endorses chills. Patient reports that her diarrhea change to bloody diarrhea after initial 4-5 episodes. In ED C. Diff was checked and is negative. Patient with active nausea/vomiting on arrival to ED. She was also FOBT positive. CT abdomen demonstrated transverse/descending colitis. [facility] called to admit patient for further evaluation and treatment.   Review of Systems:  Negative except as otherwise mentioned on HPI.  Past Medical History  Diagnosis Date  . Arthritis   . Depression   . Migraines   . Allergy   . History of chicken pox   . History of colon polyps   . Chronic neck pain   . Chronic back pain   . Sciatica   . Thoracic compression fracture    Past Surgical History  Procedure Laterality Date  . Polypectomy    . Partial hysterectomy    . Colonoscopy  2005    last 2005   Social History:  reports that she has never smoked. She has never used smokeless tobacco. She reports that she does not drink alcohol or use illicit drugs.  No Known Allergies  Family History  Problem Relation Age of Onset  . Heart disease Mother   . Heart disease Father   . Prostate cancer Father   . Diabetes Sister   . Stomach cancer Sister 58  . Diabetes Brother   . Heart disease Brother   . Rectal cancer Neg Hx   . Colon cancer Neg Hx      Prior to Admission medications    Medication Sig Start Date End Date Taking? Authorizing Provider  acetaminophen (TYLENOL) 650 MG CR tablet Take 650 mg by mouth every 8 (eight) hours as needed for pain.   Yes Historical Provider, MD  Calcium-Magnesium-Vitamin D (CALCIUM 1200+D3 PO) Take 2 capsules by mouth daily.   Yes Historical Provider, MD  citalopram (CELEXA) 20 MG tablet Take 1 tablet (20 mg total) by mouth daily. 12/15/13  Yes Janith Lima, MD  Ferrous Sulfate (RA IRON) 27 MG TABS Take 1 tablet by mouth as needed.   Yes Historical Provider, MD  ibuprofen (ADVIL,MOTRIN) 200 MG tablet Take 200 mg by mouth every 6 (six) hours as needed for pain.   Yes Historical Provider, MD  loratadine (CLARITIN) 10 MG tablet Take 10 mg by mouth at bedtime as needed (helps with sleep and headaches).   Yes Historical Provider, MD  Multiple Vitamin (STRESS FORMULA 500/BIOTIN PO) Take 1 tablet by mouth daily.   Yes Historical Provider, MD  omeprazole (PRILOSEC) 20 MG capsule Take 1 capsule (20 mg total) by mouth daily. 12/15/13  Yes Janith Lima, MD  vitamin B-12 (CYANOCOBALAMIN) 1000 MCG tablet Take 1,000 mcg by mouth daily.   Yes Historical Provider, MD  vitamin E 400 UNIT capsule Take 400 Units by mouth daily.   Yes Historical Provider, MD   Physical Exam: Filed Vitals:   05/25/14 1445 05/25/14  1559 05/25/14 1615 05/25/14 1633  BP: 141/65 140/75 144/81 134/78  Pulse: 73 76 77 79  Temp:    98.4 F (36.9 C)  TempSrc:    Oral  Resp: 14 16 11 16   SpO2: 100% 99% 99% 100%    Wt Readings from Last 3 Encounters:  05/12/14 63.05 kg (139 lb)  12/15/13 60.782 kg (134 lb)  11/14/13 61.236 kg (135 lb)    General:  Appears calm and comfortable; afebrile. reported abd pain is better and currently just feeling slightly nauseous  Eyes: PERRL, normal lids, irises & conjunctiva, no icterus, no nystagmus ENT: grossly normal hearing, slight MM dryness, no erythema or exudates inside her mouth; no drainage from ears appreciated; mild nasal  congestion, but no rhinorrhea  Neck: no LAD, masses or thyromegaly, no JVD Cardiovascular: RRR, no m/r/g. No LE edema. Respiratory: CTA bilaterally, no w/r/r. Normal respiratory effort. Abdomen: soft, no distended, tenderness to palpation (especially left mid quadrant), positive BS Skin: no rash or induration seen on limited exam. Musculoskeletal: grossly normal tone BUE/BLE; FROM Psychiatric: grossly normal mood and affect, speech fluent and appropriate Neurologic: grossly non-focal.          Labs on Admission:  Basic Metabolic Panel:  Recent Labs Lab 05/25/14 1239  NA 140  K 3.7  CL 104  CO2 22  GLUCOSE 97  BUN 17  CREATININE 0.90  CALCIUM 9.7   Liver Function Tests:  Recent Labs Lab 05/25/14 1239  AST 22  ALT 18  ALKPHOS 161*  BILITOT 0.3  PROT 7.5  ALBUMIN 4.1    Recent Labs Lab 05/25/14 1239  LIPASE 65*   CBC:  Recent Labs Lab 05/25/14 1239  WBC 6.5  HGB 13.5  HCT 39.8  MCV 83.8  PLT 276   Radiological Exams on Admission: Ct Abdomen Pelvis W Contrast  05/25/2014   CLINICAL DATA:  Lower abdominal pain with nausea, vomiting, and diarrhea. Blood noted in stool.  EXAM: CT ABDOMEN AND PELVIS WITH CONTRAST  TECHNIQUE: Multidetector CT imaging of the abdomen and pelvis was performed using the standard protocol following bolus administration of intravenous contrast. Oral contrast was also administered.  CONTRAST:  161mL OMNIPAQUE IOHEXOL 300 MG/ML  SOLN  COMPARISON:  None.  FINDINGS: There is atelectatic change in the right base posteriorly. Lung bases are otherwise clear. There is a focal hiatal hernia present.  There is hepatic steatosis. No focal liver lesions are identified. Gallbladder wall is not thickened. There is no appreciable biliary duct dilatation.  Spleen, pancreas, and adrenals appear normal. Kidneys bilaterally show no evidence of hydronephrosis on either side. There is an 8 x 5 mm angiomyolipoma in the upper pole the right kidney. No other  renal mass is appreciated. There is no renal or ureteral calculus on either side.  In the pelvis, the urinary bladder is midline with normal wall thickness. Uterus is absent. There is a cyst in the right ovary measuring 2.1 x 1.6 cm. There is no other appreciable pelvic mass. The terminal ileum appears normal. Appendix appears normal.  There is no bowel obstruction.  No free air or portal venous air.  There is thickening of the wall of the colon in the distal transverse colon region as well as throughout much of the descending colon to the level of the sigmoid -descending colon junction. There is no appreciable wall thickness in the sigmoid colon itself. The remainder of the colon appears unremarkable.  There is no ascites, adenopathy, or abscess in the abdomen  or pelvis. There is atherosclerotic change in the aorta but no apparent aneurysm. There is degenerative change in the lumbar spine. There are no lytic bone lesions. There is a sclerotic focus in the right sacral ala, statistically most likely a bone island.  IMPRESSION: Evidence of colitis involving the distal transverse colon as well as most of the descending colon. There is no appreciable surrounding mesenteric thickening. No abscess. No diverticulitis appreciable.  No bowel obstruction. No abscess. Appendix appears normal. No renal or ureteral calculus. Uterus absent.  Small right ovarian cyst. Statistically, this small cyst is most likely benign. It would be advisable to consider a pelvic ultrasound in approximately 1 year to confirm stability given this finding, however. This recommendation follows ACR consensus guidelines: White Paper of the ACR Incidental Findings Committee II on Adnexal Findings. J Am Coll Radiol [phone].  Small angiomyolipoma upper pole right kidney, a benign finding.   Electronically Signed   By: Lowella Grip M.D.   On: 05/25/2014 15:17    EKG:  None   Assessment/Plan 1-abd pain, nausea, vomiting and diarrhea:  due to Colitis -C. Diff neg -will admit to med-surg -start IV cipro and flagyl -will check Gi pathogen -start florastor BID -supportive IVF's and just Clear Liquids Diet to start (will provide some bowel rest) -patient with colonoscopy 2 years ago (just one benign polyp found); might required GI follow up as an outpatient in 4-6 weeks -PRN antiemetics will be ordered along with PRN pain meds -follow clinical response  2-Lower GI Bleed: due to friable colonic mucosae with colitis.  -CT w/o diverticulosis or diverticulitis  -Hgb is stable -will avoid heparin products -provide gentle hydration -follow Hgb trend in am -will type and screen -C. Diff neg -will treat colitis  3-GERD: will continue PPI  4-Depression: mood is stable. -patient denies SI and hallucinations -will continue celexa  5-Allergic rhinitis: chronic and stable. -continue Claritin  6-Chronic low back pain: stable. -continue home pain meds.   Code Status: Full DVT Prophylaxis:SCD's Family Communication: no family at bedside Disposition Plan: LOS > 2 midnights, inpatient; med-surg bed  Time spent: 58 minutes  Barton Dubois [facility] Pager [phone]

## 2014-05-25 NOTE — ED Notes (Signed)
Admitting MD at bedside.

## 2014-05-26 ENCOUNTER — Encounter (HOSPITAL_COMMUNITY): Payer: Self-pay | Admitting: General Practice

## 2014-05-26 LAB — BASIC METABOLIC PANEL
Anion gap: 13 (ref 5–15)
BUN: 10 mg/dL (ref 6–23)
CO2: 21 meq/L (ref 19–32)
Calcium: 9.2 mg/dL (ref 8.4–10.5)
Chloride: 106 mEq/L (ref 96–112)
Creatinine, Ser: 0.93 mg/dL (ref 0.50–1.10)
GFR calc Af Amer: 72 mL/min — ABNORMAL LOW (ref >90)
GFR calc non Af Amer: 62 mL/min — ABNORMAL LOW (ref >90)
GLUCOSE: 111 mg/dL — AB (ref 70–99)
Potassium: 3.7 mEq/L (ref 3.7–5.3)
Sodium: 140 mEq/L (ref 137–147)

## 2014-05-26 LAB — CBC
HEMATOCRIT: 37.2 % (ref 36.0–46.0)
HEMOGLOBIN: 12.5 g/dL (ref 12.0–15.0)
MCH: 28.5 pg (ref 26.0–34.0)
MCHC: 33.6 g/dL (ref 30.0–36.0)
MCV: 84.7 fL (ref 78.0–100.0)
Platelets: 239 10*3/uL (ref 150–400)
RBC: 4.39 MIL/uL (ref 3.87–5.11)
RDW: 13.7 % (ref 11.5–15.5)
WBC: 6.4 10*3/uL (ref 4.0–10.5)

## 2014-05-26 MED ORDER — LIDOCAINE HCL 1 % IJ SOLN
INTRAMUSCULAR | Status: AC
  Filled 2014-05-26: qty 20

## 2014-05-26 MED ORDER — METOCLOPRAMIDE HCL 5 MG/ML IJ SOLN
10.0000 mg | Freq: Once | INTRAMUSCULAR | Status: AC
  Administered 2014-05-26: 10 mg via INTRAVENOUS
  Filled 2014-05-26: qty 2

## 2014-05-26 MED ORDER — DIPHENHYDRAMINE HCL 50 MG/ML IJ SOLN
25.0000 mg | Freq: Once | INTRAMUSCULAR | Status: AC
  Administered 2014-05-26: 25 mg via INTRAVENOUS
  Filled 2014-05-26: qty 1

## 2014-05-26 MED ORDER — PNEUMOCOCCAL VAC POLYVALENT 25 MCG/0.5ML IJ INJ: 0.5000 mL | INJECTION | INTRAMUSCULAR | Status: DC

## 2014-05-26 MED ORDER — KETOROLAC TROMETHAMINE 30 MG/ML IJ SOLN
30.0000 mg | Freq: Once | INTRAMUSCULAR | Status: AC
  Administered 2014-05-26: 30 mg via INTRAVENOUS
  Filled 2014-05-26: qty 1

## 2014-05-26 NOTE — Plan of Care (Signed)
Problem: Phase I Progression Outcomes Goal: Pain controlled with appropriate interventions Outcome: Progressing     

## 2014-05-26 NOTE — Progress Notes (Signed)
Patient ID: Charlene Patterson  female  JKK:[id number]    DOB: [birth year]    DOA: 05/25/2014  PCP: Scarlette Calico, MD  Brief history of present illness  Patient is a 68 year old female with depression, allergic rhinitis, chronic back pain who presented with nausea, vomiting and abdominal pain. She reported symptoms present for last 36 hours or so prior to admission and has been worsening, reported her diarrhea changed to bloody diarrhea after initial 4-5 episodes. C. difficile was negative. CT abdomen showed a transverse/descending colitis. Patient reported eating at buffet in a local restaurant on Sunday after which the symptoms started.  Assessment/Plan: Principal Problem:   Colitis - Continue clear liquid diet for now, IV fluids, pain control, antiemetics - Continue IV ciprofloxacin and Flagyl - Advance diet to full liquids for supper tonight   -Patient follows [facility], will consult inpatient if the symptoms worsening or outpatient follow-up    Active Problems:   GERD (gastroesophageal reflux disease) - Continue PPI     Depression Currently noted stable, no acute issues     Allergic rhinitis -Continue Claritin      Chronic low back pain - Continue pain medications   DVT Prophylaxis: SCDs  Code Status:  Family Communication:  Disposition:  Consultants:None  Procedures none   Antibiotics  IV ciprofloxacin 11/23 IV Flagyl 11/23   Subjective  patient seen and examined, denies any active nausea, vomiting or diarrhea, abdominal pain improving, no fevers or chills   Objective  Weight change:   Intake/Output Summary (Last 24 hours) at 05/26/14 1158 Last data filed at 05/26/14 0900  Gross per 24 hour  Intake 1281.25 ml  Output      0 ml  Net 1281.25 ml   Blood pressure 113/67, pulse 75, temperature 98.7 F (37.1 C), temperature source Oral, resp. rate 16, SpO2 96 %.  Physical Exam: General: Alert and awake, oriented x3, not in any acute  distress. CVS: S1-S2 clear, no murmur rubs or gallops Chest: clear to auscultation bilaterally, no wheezing, rales or rhonchi Abdomen: soft mild diffuse tenderness to deep palpation, normal bowel sounds  Extremities: no cyanosis, clubbing or edema noted bilaterally   Lab Results: Basic Metabolic Panel:  Recent Labs Lab 05/25/14 1239 05/25/14 1808 05/26/14 0437  NA 140  --  140  K 3.7  --  3.7  CL 104  --  106  CO2 22  --  21  GLUCOSE 97  --  111*  BUN 17  --  10  CREATININE 0.90  --  0.93  CALCIUM 9.7  --  9.2  MG  --  2.1  --   PHOS  --  3.0  --    Liver Function Tests:  Recent Labs Lab 05/25/14 1239  AST 22  ALT 18  ALKPHOS 161*  BILITOT 0.3  PROT 7.5  ALBUMIN 4.1    Recent Labs Lab 05/25/14 1239  LIPASE 65*   No results for input(s): AMMONIA in the last 168 hours. CBC:  Recent Labs Lab 05/25/14 1239 05/26/14 0437  WBC 6.5 6.4  HGB 13.5 12.5  HCT 39.8 37.2  MCV 83.8 84.7  PLT 276 239   Cardiac Enzymes: No results for input(s): CKTOTAL, CKMB, CKMBINDEX, TROPONINI in the last 168 hours. BNP: Invalid input(s): POCBNP CBG: No results for input(s): GLUCAP in the last 168 hours.   Micro Results: Recent Results (from the past 240 hour(s))  Clostridium Difficile by PCR     Status: None   Collection Time:  05/25/14  1:43 PM  Result Value Ref Range Status   C difficile by pcr NEGATIVE NEGATIVE Final    Studies/Results: Dg Cervical Spine Complete  05/12/2014   CLINICAL DATA:  Chronic neck and right shoulder pain without known injury  EXAM: CERVICAL SPINE  4+ VIEWS  COMPARISON:  None.  FINDINGS: The cervical vertebral bodies are preserved in height. The intervertebral disc space heights are reasonably well maintained. There is mild facet joint hypertrophy at multiple levels. The spinous processes are intact. The oblique views reveal mild bony encroachment upon the neural foramina in the upper cervical spine on the right. The odontoid is intact.   IMPRESSION: There is mild degenerative change of the uncovertebral joints in the upper cervical spine on the right resulting in mild neural foraminal encroachment. There is no significant disc space narrowing.   Electronically Signed   By: David  Martinique   On: 05/12/2014 14:04   Dg Lumbar Spine Complete  05/12/2014   CLINICAL DATA:  Chronic low back pain left hip pain. Radiation down left leg. No history of recent injury. Initial evaluation.  EXAM: LUMBAR SPINE - COMPLETE 4+ VIEW  COMPARISON:  Chest x-ray 12/15/2013.  FINDINGS: Paraspinal soft tissues are normal. Diffuse degenerative changes lumbar spine with scoliosis concave left. Minimal T12 compression noted. This appears new from prior chest x-ray of 12/15/2013 . Sclerotic density noted right sacrum. Although this is most likely a bone island, blastic metastatic focus cannot be excluded 2  IMPRESSION: 1. Diffuse degenerative changes lumbar spine with mild scoliosis concave left. There is mild compression of T12, this is new from prior chest x-ray of 12/15/2013. 2. Small sclerotic density right sacral wing. Statistically this is most likely a bone island. Although blastic metastatic disease cannot be excluded, no other sclerotic lesions are identified.   Electronically Signed   By: Marcello Moores  Register   On: 05/12/2014 14:04   Dg Shoulder Right  05/12/2014   CLINICAL DATA:  Right shoulder pain  EXAM: RIGHT SHOULDER - 2+ VIEW  COMPARISON:  None.  FINDINGS: There is no evidence of fracture or dislocation. There is no evidence of arthropathy or other focal bone abnormality. Soft tissues are unremarkable.  IMPRESSION: Negative.   Electronically Signed   By: Franchot Gallo M.D.   On: 05/12/2014 14:10   Ct Abdomen Pelvis W Contrast  05/25/2014   CLINICAL DATA:  Lower abdominal pain with nausea, vomiting, and diarrhea. Blood noted in stool.  EXAM: CT ABDOMEN AND PELVIS WITH CONTRAST  TECHNIQUE: Multidetector CT imaging of the abdomen and pelvis was performed  using the standard protocol following bolus administration of intravenous contrast. Oral contrast was also administered.  CONTRAST:  12mL OMNIPAQUE IOHEXOL 300 MG/ML  SOLN  COMPARISON:  None.  FINDINGS: There is atelectatic change in the right base posteriorly. Lung bases are otherwise clear. There is a focal hiatal hernia present.  There is hepatic steatosis. No focal liver lesions are identified. Gallbladder wall is not thickened. There is no appreciable biliary duct dilatation.  Spleen, pancreas, and adrenals appear normal. Kidneys bilaterally show no evidence of hydronephrosis on either side. There is an 8 x 5 mm angiomyolipoma in the upper pole the right kidney. No other renal mass is appreciated. There is no renal or ureteral calculus on either side.  In the pelvis, the urinary bladder is midline with normal wall thickness. Uterus is absent. There is a cyst in the right ovary measuring 2.1 x 1.6 cm. There is no other appreciable pelvic mass.  The terminal ileum appears normal. Appendix appears normal.  There is no bowel obstruction.  No free air or portal venous air.  There is thickening of the wall of the colon in the distal transverse colon region as well as throughout much of the descending colon to the level of the sigmoid -descending colon junction. There is no appreciable wall thickness in the sigmoid colon itself. The remainder of the colon appears unremarkable.  There is no ascites, adenopathy, or abscess in the abdomen or pelvis. There is atherosclerotic change in the aorta but no apparent aneurysm. There is degenerative change in the lumbar spine. There are no lytic bone lesions. There is a sclerotic focus in the right sacral ala, statistically most likely a bone island.  IMPRESSION: Evidence of colitis involving the distal transverse colon as well as most of the descending colon. There is no appreciable surrounding mesenteric thickening. No abscess. No diverticulitis appreciable.  No bowel  obstruction. No abscess. Appendix appears normal. No renal or ureteral calculus. Uterus absent.  Small right ovarian cyst. Statistically, this small cyst is most likely benign. It would be advisable to consider a pelvic ultrasound in approximately 1 year to confirm stability given this finding, however. This recommendation follows ACR consensus guidelines: White Paper of the ACR Incidental Findings Committee II on Adnexal Findings. J Am Coll Radiol [phone].  Small angiomyolipoma upper pole right kidney, a benign finding.   Electronically Signed   By: Lowella Grip M.D.   On: 05/25/2014 15:17    Medications: Scheduled Meds: . ciprofloxacin  400 mg Intravenous Q12H  . citalopram  20 mg Oral Daily  . lidocaine      . metronidazole  500 mg Intravenous Q8H  . pantoprazole  40 mg Oral Daily  . saccharomyces boulardii  250 mg Oral BID  . vitamin B-12  1,000 mcg Oral Daily  . vitamin E  400 Units Oral Daily      LOS: 1 day   RAI,RIPUDEEP M.D. [facility] 05/26/2014, 11:58 AM Pager: [phone]  If 7PM-7AM, please contact night-coverage [email or web address] Password [email or web address]

## 2014-05-27 LAB — BASIC METABOLIC PANEL
Anion gap: 11 (ref 5–15)
BUN: 9 mg/dL (ref 6–23)
CO2: 22 mEq/L (ref 19–32)
CREATININE: 0.93 mg/dL (ref 0.50–1.10)
Calcium: 8.8 mg/dL (ref 8.4–10.5)
Chloride: 108 mEq/L (ref 96–112)
GFR calc Af Amer: 72 mL/min — ABNORMAL LOW (ref >90)
GFR, EST NON AFRICAN AMERICAN: 62 mL/min — AB (ref >90)
Glucose, Bld: 121 mg/dL — ABNORMAL HIGH (ref 70–99)
Potassium: 3.8 mEq/L (ref 3.7–5.3)
Sodium: 141 mEq/L (ref 137–147)

## 2014-05-27 LAB — CBC
HEMATOCRIT: 33 % — AB (ref 36.0–46.0)
Hemoglobin: 10.9 g/dL — ABNORMAL LOW (ref 12.0–15.0)
MCH: 28.1 pg (ref 26.0–34.0)
MCHC: 33 g/dL (ref 30.0–36.0)
MCV: 85.1 fL (ref 78.0–100.0)
Platelets: 220 10*3/uL (ref 150–400)
RBC: 3.88 MIL/uL (ref 3.87–5.11)
RDW: 13.7 % (ref 11.5–15.5)
WBC: 4.5 10*3/uL (ref 4.0–10.5)

## 2014-05-27 MED ORDER — CIPROFLOXACIN HCL 500 MG PO TABS: 500.0000 mg | ORAL_TABLET | Freq: Two times a day (BID) | ORAL | Status: DC

## 2014-05-27 MED ORDER — SACCHAROMYCES BOULARDII 250 MG PO CAPS: 250.0000 mg | ORAL_CAPSULE | Freq: Two times a day (BID) | ORAL | Status: DC

## 2014-05-27 MED ORDER — METRONIDAZOLE 500 MG PO TABS: 500.0000 mg | ORAL_TABLET | Freq: Three times a day (TID) | ORAL | Status: DC

## 2014-05-27 MED ORDER — CIPROFLOXACIN HCL 500 MG PO TABS
500.0000 mg | ORAL_TABLET | Freq: Two times a day (BID) | ORAL | Status: DC
  Administered 2014-05-27: 500 mg via ORAL
  Filled 2014-05-27: qty 1

## 2014-05-27 MED ORDER — TRAMADOL HCL 50 MG PO TABS: 50.0000 mg | ORAL_TABLET | Freq: Four times a day (QID) | ORAL | Status: DC | PRN

## 2014-05-27 MED ORDER — METRONIDAZOLE 500 MG PO TABS
500.0000 mg | ORAL_TABLET | Freq: Three times a day (TID) | ORAL | Status: DC
  Administered 2014-05-27: 500 mg via ORAL
  Filled 2014-05-27: qty 1

## 2014-05-27 NOTE — Progress Notes (Signed)
Discharge instructions and Rx's given and reviewed with patient. Pt reports that she doesn't have any questions at this time and is ready for discharge.

## 2014-05-27 NOTE — Care Management Note (Signed)
  Page 1 of 1   05/27/2014     9:34:54 AM CARE MANAGEMENT NOTE 05/27/2014  Patient:  Charlene Patterson, Charlene Patterson   Account Number:  [id number]  Date Initiated:  05/27/2014  Documentation initiated by:  Magdalen Spatz  Subjective/Objective Assessment:     Action/Plan:   Anticipated DC Date:  05/28/2014   Anticipated DC Plan:  HOME/SELF CARE         Choice offered to / List presented to:             Status of service:   Medicare Important Message given?  YES (If response is "NO", the following Medicare IM given date fields will be blank) Date Medicare IM given:  05/27/2014 Medicare IM given by:  Magdalen Spatz Date Additional Medicare IM given:   Additional Medicare IM given by:    Discharge Disposition:    Per UR Regulation:    If discussed at Long Length of Stay Meetings, dates discussed:    Comments:

## 2014-05-27 NOTE — Discharge Summary (Signed)
Physician Discharge Summary  Patient ID: Charlene Patterson MRN: [id number] DOB/AGE: 1946-07-02 68 y.o.  Admit date: 05/25/2014 Discharge date: 05/27/2014  Primary Care Physician:  Scarlette Calico, MD  Discharge Diagnoses:    . acute Colitis . Nausea vomiting and diarrhea . GERD (gastroesophageal reflux disease) . Depression . Allergic rhinitis . Chronic low back pain  Consults:  None   Recommendations for Outpatient Follow-up:  Outpatient GI follow-up is recommended in 2-4 weeks  Allergies:  No Known Allergies   Discharge Medications:   Medication List    STOP taking these medications        ibuprofen 200 MG tablet  Commonly known as:  ADVIL,MOTRIN      TAKE these medications        acetaminophen 650 MG CR tablet  Commonly known as:  TYLENOL  Take 650 mg by mouth every 8 (eight) hours as needed for pain.     CALCIUM 1200+D3 PO  Take 2 capsules by mouth daily.     ciprofloxacin 500 MG tablet  Commonly known as:  CIPRO  Take 1 tablet (500 mg total) by mouth 2 (two) times daily. X2 weeks     citalopram 20 MG tablet  Commonly known as:  CELEXA  Take 1 tablet (20 mg total) by mouth daily.     loratadine 10 MG tablet  Commonly known as:  CLARITIN  Take 10 mg by mouth at bedtime as needed (helps with sleep and headaches).     metroNIDAZOLE 500 MG tablet  Commonly known as:  FLAGYL  Take 1 tablet (500 mg total) by mouth 3 (three) times daily. X 2 weeks     omeprazole 20 MG capsule  Commonly known as:  PRILOSEC  Take 1 capsule (20 mg total) by mouth daily.     RA IRON 27 MG Tabs  Generic drug:  Ferrous Sulfate  Take 1 tablet by mouth as needed.     saccharomyces boulardii 250 MG capsule  Commonly known as:  FLORASTOR  Take 1 capsule (250 mg total) by mouth 2 (two) times daily.     STRESS FORMULA 500/BIOTIN PO  Take 1 tablet by mouth daily.     traMADol 50 MG tablet  Commonly known as:  ULTRAM  Take 1 tablet (50 mg total) by mouth every 6 (six) hours as  needed for moderate pain.     vitamin B-12 1000 MCG tablet  Commonly known as:  CYANOCOBALAMIN  Take 1,000 mcg by mouth daily.     vitamin E 400 UNIT capsule  Take 400 Units by mouth daily.         Brief H and P: For complete details please refer to admission H and P, but in brief patient is a 68 year old female with history of depression, allergic rhinitis, chronic back pain presented to ED with nausea and vomiting and abdominal pain. She reported symptoms present for last 36 hours prior to admission and was worsening. She denied any fevers, reported that her diarrhea changed to bloody diarrhea after initial 4-5 episodes. In ED C. difficile was checked and was negative. Patient was FOBT positive, CT abdomen demonstrated transfers/descending colitis hence patient was admitted for further workup.  Hospital Course:   Acute colitis Significant improvement, patient was placed on clear liquid diet, IV fluids, pain control and antiemetics. She was placed on IV ciprofloxacin and Flagyl. Diet was slowly advanced to soft solids today, she has been tolerating diet without any difficulty. Patient will be discharged home, she was  recommended to follow up with her outpatient gastroenterologist if her symptoms return or for hospitalization follow-up.  GERD: Stable, continued on PPI  Allergic Rhinitis: Continue Claritin  Chronic low back pain: Continue pain medications  Day of Discharge BP 109/62 mmHg  Pulse 80  Temp(Src) 98.2 F (36.8 C) (Oral)  Resp 16  SpO2 98%  Physical Exam: General: Alert and awake oriented x3 not in any acute distress. CVS: S1-S2 clear no murmur rubs or gallops Chest: clear to auscultation bilaterally, no wheezing rales or rhonchi Abdomen: soft nontender, nondistended, normal bowel sounds Extremities: no cyanosis, clubbing or edema noted bilaterally Neuro: Cranial nerves II-XII intact, no focal neurological deficits   The results of significant diagnostics from  this hospitalization (including imaging, microbiology, ancillary and laboratory) are listed below for reference.    LAB RESULTS: Basic Metabolic Panel:  Recent Labs Lab 05/25/14 1808 05/26/14 0437 05/27/14 0531  NA  --  140 141  K  --  3.7 3.8  CL  --  106 108  CO2  --  21 22  GLUCOSE  --  111* 121*  BUN  --  10 9  CREATININE  --  0.93 0.93  CALCIUM  --  9.2 8.8  MG 2.1  --   --   PHOS 3.0  --   --    Liver Function Tests:  Recent Labs Lab 05/25/14 1239  AST 22  ALT 18  ALKPHOS 161*  BILITOT 0.3  PROT 7.5  ALBUMIN 4.1    Recent Labs Lab 05/25/14 1239  LIPASE 65*   No results for input(s): AMMONIA in the last 168 hours. CBC:  Recent Labs Lab 05/26/14 0437 05/27/14 0531  WBC 6.4 4.5  HGB 12.5 10.9*  HCT 37.2 33.0*  MCV 84.7 85.1  PLT 239 220   Cardiac Enzymes: No results for input(s): CKTOTAL, CKMB, CKMBINDEX, TROPONINI in the last 168 hours. BNP: Invalid input(s): POCBNP CBG: No results for input(s): GLUCAP in the last 168 hours.  Significant Diagnostic Studies:  Ct Abdomen Pelvis W Contrast  05/25/2014   CLINICAL DATA:  Lower abdominal pain with nausea, vomiting, and diarrhea. Blood noted in stool.  EXAM: CT ABDOMEN AND PELVIS WITH CONTRAST  TECHNIQUE: Multidetector CT imaging of the abdomen and pelvis was performed using the standard protocol following bolus administration of intravenous contrast. Oral contrast was also administered.  CONTRAST:  178mL OMNIPAQUE IOHEXOL 300 MG/ML  SOLN  COMPARISON:  None.  FINDINGS: There is atelectatic change in the right base posteriorly. Lung bases are otherwise clear. There is a focal hiatal hernia present.  There is hepatic steatosis. No focal liver lesions are identified. Gallbladder wall is not thickened. There is no appreciable biliary duct dilatation.  Spleen, pancreas, and adrenals appear normal. Kidneys bilaterally show no evidence of hydronephrosis on either side. There is an 8 x 5 mm angiomyolipoma in the  upper pole the right kidney. No other renal mass is appreciated. There is no renal or ureteral calculus on either side.  In the pelvis, the urinary bladder is midline with normal wall thickness. Uterus is absent. There is a cyst in the right ovary measuring 2.1 x 1.6 cm. There is no other appreciable pelvic mass. The terminal ileum appears normal. Appendix appears normal.  There is no bowel obstruction.  No free air or portal venous air.  There is thickening of the wall of the colon in the distal transverse colon region as well as throughout much of the descending colon to the level  of the sigmoid -descending colon junction. There is no appreciable wall thickness in the sigmoid colon itself. The remainder of the colon appears unremarkable.  There is no ascites, adenopathy, or abscess in the abdomen or pelvis. There is atherosclerotic change in the aorta but no apparent aneurysm. There is degenerative change in the lumbar spine. There are no lytic bone lesions. There is a sclerotic focus in the right sacral ala, statistically most likely a bone island.  IMPRESSION: Evidence of colitis involving the distal transverse colon as well as most of the descending colon. There is no appreciable surrounding mesenteric thickening. No abscess. No diverticulitis appreciable.  No bowel obstruction. No abscess. Appendix appears normal. No renal or ureteral calculus. Uterus absent.  Small right ovarian cyst. Statistically, this small cyst is most likely benign. It would be advisable to consider a pelvic ultrasound in approximately 1 year to confirm stability given this finding, however. This recommendation follows ACR consensus guidelines: White Paper of the ACR Incidental Findings Committee II on Adnexal Findings. J Am Coll Radiol [phone].  Small angiomyolipoma upper pole right kidney, a benign finding.   Electronically Signed   By: Lowella Grip M.D.   On: 05/25/2014 15:17       Disposition and Follow-up:      Discharge Instructions    Discharge instructions    Complete by:  As directed   Diet: SOFT     Increase activity slowly    Complete by:  As directed             DISPOSITION home  DIET soft diet   DISCHARGE FOLLOW-UP Follow-up Information    Follow up with Scarlette Calico, MD. Schedule an appointment as soon as possible for a visit in 10 days.   Specialty:  Internal Medicine   Why:  for hospital follow-up   Contact information:   [address] [address] [phone]       Time spent on Discharge: 39 mins  Signed:   Addam Goeller M.D. [facility] 05/27/2014, 1:30 PM Pager: [phone]

## 2014-06-01 LAB — GI PATHOGEN PANEL BY PCR, STOOL
C DIFFICILE TOXIN A/B: NEGATIVE
Campylobacter by PCR: NEGATIVE
Cryptosporidium by PCR: NEGATIVE
E COLI (STEC): NEGATIVE
E COLI 0157 BY PCR: NEGATIVE
E coli (ETEC) LT/ST: NEGATIVE
G lamblia by PCR: NEGATIVE
Norovirus GI/GII: NEGATIVE
Rotavirus A by PCR: NEGATIVE
SALMONELLA BY PCR: NEGATIVE
Shigella by PCR: NEGATIVE

## 2014-06-04 ENCOUNTER — Ambulatory Visit
Admission: RE | Admit: 2014-06-04 | Discharge: 2014-06-04 | Disposition: A | Discharge status: Home / Self Care | Payer: Commercial Managed Care - HMO | Source: Ambulatory Visit | Attending: Internal Medicine | Admitting: Internal Medicine

## 2014-06-04 ENCOUNTER — Other Ambulatory Visit: Payer: Self-pay | Admitting: Internal Medicine

## 2014-06-04 ENCOUNTER — Ambulatory Visit (INDEPENDENT_AMBULATORY_CARE_PROVIDER_SITE_OTHER): Payer: Commercial Managed Care - HMO | Admitting: Internal Medicine

## 2014-06-04 ENCOUNTER — Other Ambulatory Visit (INDEPENDENT_AMBULATORY_CARE_PROVIDER_SITE_OTHER): Payer: Medicare PPO

## 2014-06-04 ENCOUNTER — Encounter: Payer: Self-pay | Admitting: Internal Medicine

## 2014-06-04 ENCOUNTER — Telehealth: Payer: Self-pay | Admitting: *Deleted

## 2014-06-04 VITALS — BP 120/70 | HR 75 | Temp 97.9°F | Resp 16 | Ht 64.0 in | Wt 136.0 lb

## 2014-06-04 DIAGNOSIS — M8580 Other specified disorders of bone density and structure, unspecified site: Secondary | ICD-10-CM

## 2014-06-04 DIAGNOSIS — S22000D Wedge compression fracture of unspecified thoracic vertebra, subsequent encounter for fracture with routine healing: Secondary | ICD-10-CM

## 2014-06-04 DIAGNOSIS — K2100 Gastro-esophageal reflux disease with esophagitis, without bleeding: Secondary | ICD-10-CM

## 2014-06-04 DIAGNOSIS — K529 Noninfective gastroenteritis and colitis, unspecified: Secondary | ICD-10-CM

## 2014-06-04 DIAGNOSIS — S22000A Wedge compression fracture of unspecified thoracic vertebra, initial encounter for closed fracture: Secondary | ICD-10-CM

## 2014-06-04 DIAGNOSIS — M899 Disorder of bone, unspecified: Secondary | ICD-10-CM

## 2014-06-04 LAB — CBC WITH DIFFERENTIAL/PLATELET
Basophils Absolute: 0 10*3/uL (ref 0.0–0.1)
Basophils Relative: 0.1 % (ref 0.0–3.0)
EOS ABS: 0 10*3/uL (ref 0.0–0.7)
Eosinophils Relative: 0.2 % (ref 0.0–5.0)
HCT: 41.6 % (ref 36.0–46.0)
HEMOGLOBIN: 13.9 g/dL (ref 12.0–15.0)
Lymphocytes Relative: 22.7 % (ref 12.0–46.0)
Lymphs Abs: 1.2 10*3/uL (ref 0.7–4.0)
MCHC: 33.3 g/dL (ref 30.0–36.0)
MCV: 85.6 fl (ref 78.0–100.0)
MONO ABS: 0.5 10*3/uL (ref 0.1–1.0)
Monocytes Relative: 8.7 % (ref 3.0–12.0)
NEUTROS ABS: 3.7 10*3/uL (ref 1.4–7.7)
NEUTROS PCT: 68.3 % (ref 43.0–77.0)
Platelets: 327 10*3/uL (ref 150.0–400.0)
RBC: 4.86 Mil/uL (ref 3.87–5.11)
RDW: 14.1 % (ref 11.5–15.5)
WBC: 5.4 10*3/uL (ref 4.0–10.5)

## 2014-06-04 LAB — SEDIMENTATION RATE: Sed Rate: 21 mm/hr (ref 0–22)

## 2014-06-04 MED ORDER — DEXLANSOPRAZOLE 60 MG PO CPDR: 60.0000 mg | DELAYED_RELEASE_CAPSULE | Freq: Every day | ORAL | Status: DC

## 2014-06-04 NOTE — Patient Instructions (Signed)

## 2014-06-04 NOTE — Progress Notes (Signed)
Pre visit review using our clinic review tool, if applicable. No additional management support is needed unless otherwise documented below in the visit note. 

## 2014-06-04 NOTE — Telephone Encounter (Signed)
DG Bone Density

## 2014-06-04 NOTE — Progress Notes (Signed)
Subjective:    Patient ID: Charlene Patterson, female    DOB: [birth year], 68 y.o.   MRN: [id number]  Gastrophageal Reflux She complains of dysphagia and heartburn. She reports no abdominal pain, no belching, no chest pain, no choking, no coughing, no early satiety, no globus sensation, no hoarse voice, no nausea, no sore throat, no stridor, no tooth decay, no water brash or no wheezing. This is a chronic problem. The current episode started more than 1 year ago. The problem has been gradually worsening. The heartburn is located in the substernum. The heartburn is of moderate intensity. The heartburn does not wake her from sleep. The heartburn does not limit her activity. The heartburn doesn't change with position. Associated symptoms include fatigue. Pertinent negatives include no anemia, melena, muscle weakness, orthopnea or weight loss. There are no known risk factors. She has tried a PPI for the symptoms. The treatment provided mild relief.      Review of Systems  Constitutional: Positive for fatigue. Negative for fever, chills, weight loss, diaphoresis, activity change, appetite change and unexpected weight change.  HENT: Negative.  Negative for hoarse voice, sore throat, trouble swallowing and voice change.   Eyes: Negative.   Respiratory: Negative.  Negative for cough, choking, chest tightness, shortness of breath, wheezing and stridor.   Cardiovascular: Negative.  Negative for chest pain, palpitations and leg swelling.  Gastrointestinal: Positive for heartburn, dysphagia and diarrhea. Negative for nausea, vomiting, abdominal pain, constipation, blood in stool, melena, abdominal distention, anal bleeding and rectal pain.  Endocrine: Negative.   Genitourinary: Negative.   Musculoskeletal: Positive for back pain. Negative for muscle weakness.  Skin: Negative.  Negative for rash.  Allergic/Immunologic: Negative.   Neurological: Negative.  Negative for dizziness, syncope, facial asymmetry,  speech difficulty, weakness, light-headedness and numbness.  Hematological: Negative.   Psychiatric/Behavioral: Negative.        Objective:   Physical Exam  Constitutional: She is oriented to person, place, and time. She appears well-developed and well-nourished.  Non-toxic appearance. She does not have a sickly appearance. She does not appear ill. No distress.  HENT:  Head: Normocephalic and atraumatic.  Mouth/Throat: Oropharynx is clear and moist. No oropharyngeal exudate.  Eyes: Conjunctivae are normal. Right eye exhibits no discharge. Left eye exhibits no discharge. No scleral icterus.  Neck: Normal range of motion. Neck supple. No JVD present. No tracheal deviation present. No thyromegaly present.  Cardiovascular: Normal rate, regular rhythm, normal heart sounds and intact distal pulses.  Exam reveals no gallop and no friction rub.   No murmur heard. Pulmonary/Chest: Effort normal and breath sounds normal. No stridor. No respiratory distress. She has no wheezes. She has no rales. She exhibits no tenderness.  Abdominal: Soft. Bowel sounds are normal. She exhibits no distension and no mass. There is no tenderness. There is no rebound and no guarding.  Musculoskeletal: Normal range of motion. She exhibits no edema or tenderness.  Lymphadenopathy:    She has no cervical adenopathy.  Neurological: She is oriented to person, place, and time.  Skin: Skin is warm and dry. No rash noted. She is not diaphoretic. No erythema. No pallor.  Vitals reviewed.     Lab Results  Component Value Date   WBC 4.5 05/27/2014   HGB 10.9* 05/27/2014   HCT 33.0* 05/27/2014   PLT 220 05/27/2014   GLUCOSE 121* 05/27/2014   CHOL 256* 12/13/2012   TRIG 177.0* 12/13/2012   HDL 49.20 12/13/2012   LDLDIRECT 161.0 12/13/2012   ALT 18  05/25/2014   AST 22 05/25/2014   NA 141 05/27/2014   K 3.8 05/27/2014   CL 108 05/27/2014   CREATININE 0.93 05/27/2014   BUN 9 05/27/2014   CO2 22 05/27/2014   TSH  2.390 05/25/2014      Assessment & Plan:

## 2014-06-05 ENCOUNTER — Encounter: Payer: Self-pay | Admitting: Gastroenterology

## 2014-06-05 NOTE — Assessment & Plan Note (Signed)
She is s/p an episode of bloody dia that sent her to the hospital Her PCR GI panel was negative for pathogens She is some better but still has an occasional watery diarrhea Will check an IBD panel and sent to GI to consider colonoscopy

## 2014-06-05 NOTE — Assessment & Plan Note (Signed)
She has persistent s/s despite low dose PPI therapy Will increase the PPI to dexilant I have asked her to see GI to consider upper endoscopy to look for pathology

## 2014-06-06 ENCOUNTER — Ambulatory Visit
Admission: RE | Admit: 2014-06-06 | Discharge: 2014-06-06 | Disposition: A | Discharge status: Home / Self Care | Payer: Commercial Managed Care - HMO | Source: Ambulatory Visit | Attending: Internal Medicine | Admitting: Internal Medicine

## 2014-06-06 DIAGNOSIS — S22000A Wedge compression fracture of unspecified thoracic vertebra, initial encounter for closed fracture: Secondary | ICD-10-CM

## 2014-06-06 DIAGNOSIS — R937 Abnormal findings on diagnostic imaging of other parts of musculoskeletal system: Secondary | ICD-10-CM

## 2014-06-06 DIAGNOSIS — M5412 Radiculopathy, cervical region: Secondary | ICD-10-CM

## 2014-06-06 DIAGNOSIS — M5442 Lumbago with sciatica, left side: Secondary | ICD-10-CM

## 2014-06-06 DIAGNOSIS — M542 Cervicalgia: Secondary | ICD-10-CM

## 2014-06-08 LAB — INFLAMMATORY BOWEL DISEASE PROFILE
Atypical p-ANCA Screen: NEGATIVE
Myeloperoxidase Abs: <1
SACCHAROMYCES CEREVISIAE IGG: 10.7 U (ref <=20.0)
Saccharomyces cerevisiae, IgA: 9.1 U (ref <=20.0)
c-ANCA Screen: NEGATIVE
p-ANCA Screen: NEGATIVE

## 2014-06-09 ENCOUNTER — Ambulatory Visit
Admission: RE | Admit: 2014-06-09 | Discharge: 2014-06-09 | Disposition: A | Discharge status: Home / Self Care | Payer: Commercial Managed Care - HMO | Source: Ambulatory Visit | Attending: Internal Medicine | Admitting: Internal Medicine

## 2014-06-09 DIAGNOSIS — Z1231 Encounter for screening mammogram for malignant neoplasm of breast: Secondary | ICD-10-CM

## 2014-06-13 ENCOUNTER — Encounter: Payer: Self-pay | Admitting: Internal Medicine

## 2014-06-13 LAB — HM MAMMOGRAPHY: HM Mammogram: NORMAL

## 2014-06-30 ENCOUNTER — Encounter: Payer: Self-pay | Admitting: Internal Medicine

## 2014-06-30 ENCOUNTER — Ambulatory Visit (INDEPENDENT_AMBULATORY_CARE_PROVIDER_SITE_OTHER): Payer: Commercial Managed Care - HMO | Admitting: Internal Medicine

## 2014-06-30 VITALS — BP 110/66 | HR 74 | Temp 97.6°F | Resp 12 | Ht 63.0 in | Wt 138.0 lb

## 2014-06-30 DIAGNOSIS — M8580 Other specified disorders of bone density and structure, unspecified site: Secondary | ICD-10-CM

## 2014-06-30 DIAGNOSIS — E559 Vitamin D deficiency, unspecified: Secondary | ICD-10-CM

## 2014-06-30 DIAGNOSIS — S22000D Wedge compression fracture of unspecified thoracic vertebra, subsequent encounter for fracture with routine healing: Secondary | ICD-10-CM

## 2014-06-30 DIAGNOSIS — M899 Disorder of bone, unspecified: Secondary | ICD-10-CM

## 2014-06-30 LAB — BASIC METABOLIC PANEL
BUN: 15 mg/dL (ref 6–23)
CHLORIDE: 106 meq/L (ref 96–112)
CO2: 27 mEq/L (ref 19–32)
Calcium: 9.4 mg/dL (ref 8.4–10.5)
Creatinine, Ser: 0.9 mg/dL (ref 0.4–1.2)
GFR: 64.57 mL/min (ref >60.00)
Glucose, Bld: 87 mg/dL (ref 70–99)
POTASSIUM: 3.7 meq/L (ref 3.5–5.1)
Sodium: 140 mEq/L (ref 135–145)

## 2014-06-30 LAB — VITAMIN D 25 HYDROXY (VIT D DEFICIENCY, FRACTURES): VITD: 11.4 ng/mL — ABNORMAL LOW (ref 30.00–100.00)

## 2014-06-30 NOTE — Progress Notes (Signed)
Patient ID: [name], female   DOB: Jun 23, 1946, 68 y.o.   MRN: [id number]   HPI  Charlene Patterson is a 68 y.o.-year-old female, referred by her PCP, Dr.Jones, for management of osteoporosis.  Pt was dx with OP in 2002. She denies any fractures or falls. No dizziness/vertigo/orthostasis.  I reviewed pt's DEXA scans: Date L1-L4 T score FN T score  12/13/2012 - 1.7 RFN: - 1.6 LFN: - 1.6  07/30/2000 - 2.5 - 1.7  02/03/1997 - 2.0 - 1.1   She took Fosamax in the past: for several years, finished course in 2002.  She had 1 fracture when she fell from the roof when she was building her house >> fractured L hip (traumatic fx)  Xray L spine 05/12/2014: 1. Diffuse degenerative changes lumbar spine with mild scoliosis concave left. There is mild compression of T12, this is new from prior chest x-ray of 12/15/2013. 2. Small sclerotic density right sacral wing. Statistically this is most likely a bone island. Although blastic metastatic disease cannot be excluded, no other sclerotic lesions are identified.  She has GERD and a hiatal hernia.   No h/o hyper/hypocalcemia. No h/o hyperparathyroidism. No h/o kidney stones. Lab Results  Component Value Date   CALCIUM 8.8 05/27/2014   CALCIUM 9.2 05/26/2014   CALCIUM 9.7 05/25/2014   CALCIUM 9.7 12/13/2012   No h/o thyrotoxicosis. Reviewed TSH recent levels:  Lab Results  Component Value Date   TSH 2.390 05/25/2014   TSH 2.62 12/13/2012   No h/o vitamin D deficiency. No vit D levels available.  No h/o CKD. Last BUN/Cr: Lab Results  Component Value Date   BUN 9 05/27/2014   CREATININE 0.93 05/27/2014   Pt was on calcium 2400 mg daily >> now on 1500 mg daily. She takes vitamin D, not every day, 3x a week, 1000 IU. She also eats dairy and green, leafy, vegetables.   + weight bearing exercises - works a lot outside: lifts wood, etc.   She does not take high vitamin A doses.  Pt does have a FH of osteoporosis in sister.   ? When  she had menopause as she had partial hysterectomy in her 51s.   ROS: Constitutional: no weight gain/loss, + fatigue, no subjective hyperthermia/hypothermia, + excessive urination Eyes: no blurry vision, no xerophthalmia ENT: no sore throat, no nodules palpated in throat, no dysphagia/odynophagia, no hoarseness, + tinnistus, + hypoacusis Cardiovascular: no CP/SOB/palpitations/leg swelling Respiratory: no cough/SOB Gastrointestinal: + all:N/V/C Musculoskeletal: + both: muscle/joint aches Skin: no rashes,+ itching, + easy bruising Neurological: no tremors/numbness/tingling/dizziness, + HA Psychiatric: no depression/anxiety  Past Medical History  Diagnosis Date  . Depression   . Allergy   . History of chicken pox   . History of colon polyps   . Chronic neck pain   . Chronic back pain   . Sciatica   . Thoracic compression fracture   . Lower GI bleeding   . GERD (gastroesophageal reflux disease)   . History of hiatal hernia   . Migraines     "monthly usually; weekly lately" (05/26/2014)  . Arthritis     "left foot" (05/26/2014)  . Anxiety    Past Surgical History  Procedure Laterality Date  . Polypectomy    . Colonoscopy  2005    last 2005  . Abdominal hysterectomy  ~ 1978    "partial"  . Appendectomy  ~ 1978   History   Social History  . Marital Status: Widowed    Spouse Name: N/A  Number of Children: 2  . Years of Education: 12   Occupational History  . Retired    Social History Main Topics  . Smoking status: Never Smoker   . Smokeless tobacco: Never Used  . Alcohol Use: No  . Drug Use: No    Social History Narrative   Regular exercise-no   Caffeine Use-yes   Current Outpatient Prescriptions on File Prior to Visit  Medication Sig Dispense Refill  . acetaminophen (TYLENOL) 650 MG CR tablet Take 650 mg by mouth every 8 (eight) hours as needed for pain.    . Calcium-Magnesium-Vitamin D (CALCIUM 1200+D3 PO) Take 2 capsules by mouth daily.    . citalopram  (CELEXA) 20 MG tablet Take 1 tablet (20 mg total) by mouth daily. 30 tablet 11  . dexlansoprazole (DEXILANT) 60 MG capsule Take 1 capsule (60 mg total) by mouth daily. 30 capsule 11  . Ferrous Sulfate (RA IRON) 27 MG TABS Take 1 tablet by mouth as needed.    . loratadine (CLARITIN) 10 MG tablet Take 10 mg by mouth at bedtime as needed (helps with sleep and headaches).    . Multiple Vitamin (STRESS FORMULA 500/BIOTIN PO) Take 1 tablet by mouth daily.    Marland Kitchen saccharomyces boulardii (FLORASTOR) 250 MG capsule Take 1 capsule (250 mg total) by mouth 2 (two) times daily. 30 capsule 0  . traMADol (ULTRAM) 50 MG tablet Take 1 tablet (50 mg total) by mouth every 6 (six) hours as needed for moderate pain. 45 tablet 0  . vitamin B-12 (CYANOCOBALAMIN) 1000 MCG tablet Take 1,000 mcg by mouth daily.    . vitamin E 400 UNIT capsule Take 400 Units by mouth daily.    . ciprofloxacin (CIPRO) 500 MG tablet Take 1 tablet (500 mg total) by mouth 2 (two) times daily. X2 weeks (Patient not taking: Reported on 06/30/2014) 28 tablet 0  . metroNIDAZOLE (FLAGYL) 500 MG tablet Take 1 tablet (500 mg total) by mouth 3 (three) times daily. X 2 weeks (Patient not taking: Reported on 06/30/2014) 42 tablet 0   No current facility-administered medications on file prior to visit.   No Known Allergies Family History  Problem Relation Age of Onset  . Heart disease Mother   . Heart disease Father   . Prostate cancer Father   . Diabetes Sister   . Stomach cancer Sister 3  . Diabetes Brother   . Heart disease Brother   . Rectal cancer Neg Hx   . Colon cancer Neg Hx    PE: BP 110/66 mmHg  Pulse 74  Temp(Src) 97.6 F (36.4 C) (Oral)  Resp 12  Ht 5\' 3"  (1.6 m)  Wt 138 lb (62.596 kg)  BMI 24.45 kg/m2  SpO2 97% Wt Readings from Last 3 Encounters:  06/30/14 138 lb (62.596 kg)  06/04/14 136 lb (61.689 kg)  05/12/14 139 lb (63.05 kg)   Constitutional: normal weight, in NAD.  Eyes: PERRLA, EOMI, no exophthalmos ENT: moist  mucous membranes, no thyromegaly, no cervical lymphadenopathy Cardiovascular: RRR, No MRG Respiratory: CTA B Gastrointestinal: abdomen soft, NT, ND, BS+ Musculoskeletal: no deformities, strength intact in all 4. No kyphosis. Skin: moist, warm, no rashes Neurological: no tremor with outstretched hands, DTR normal in all 4  Assessment: 1. Osteoporosis  Plan: 1. Osteoporosis - likely postmenopausal  - Discussed about increased risk of fracture, depending on the T score, greatly increased when the T score is lower than -2.5, but it is actually a continuum and -2.5 should not be regarded  as an absolute threshold.  - We reviewed her DEXA scan images and report together, and I explained that based on the T scores, she has an increased risk for fractures. She also has a Genant class one vb compression fx at T12, so she is at risk of having more fx's - We discussed about the different medication classes, benefits and side effects (including atypical fractures and ONJ - no dental workup in progress or planned). I explained that, since she has GERD, she is not a candidate for oral bisphosphonates, so my first choice would be IV bisphosphonate (zoledronic acid = Reclast) or, better, sq denosumab (Prolia). I don't think she is a candidate for Teriparatide at the moment. Pt was given reading information about Prolia, and I explained the mechanism of action and expected benefits. She agrees to start and continue for 3 years. After this, will need to continue with a Bisphosphonate, to preserve her BMD. We can then give her a drug holiday. - we reviewed her dietary and supplemental calcium and vitamin D intake, which I believe are adequate. I advised her to continue this. - discussed fall precautions  - given handout from [place] [facility]: weight bearing exercises - advised to do this every day or at least 5/7 days, but she is active working outside, and this helps a lot - We discussed about  the benefits of an alkaline diet, and I advised herto try to reduce the animal proteins, while maintaining a good amount of protein in her diet. The recommended daily protein intake is ~0.8 g per kilogram per day. I advised her to try to aim for this amount, since a diet low in proteins can exacerbate osteoporosis. Also, avoid smoking or >2 drinks of alcohol a day. - We will check the following tests:  Vitamin D  BMP - if all normal, will arrange for Prolia inj - will check a new DEXA scan in a year - will see pt back in a year  - time spent with the patient: 1 hour, of which >50% was spent in obtaining information about her OP, reviewing her previous labs, evaluations, and treatments, counseling her about her condition (please see the discussed topics above), and developing a plan to further investigate it. she had a number of questions which I addressed.  Office Visit on 06/30/2014  Component Date Value Ref Range Status  . Sodium 06/30/2014 140  135 - 145 mEq/L Final  . Potassium 06/30/2014 3.7  3.5 - 5.1 mEq/L Final  . Chloride 06/30/2014 106  96 - 112 mEq/L Final  . CO2 06/30/2014 27  19 - 32 mEq/L Final  . Glucose, Bld 06/30/2014 87  70 - 99 mg/dL Final  . BUN 06/30/2014 15  6 - 23 mg/dL Final  . Creatinine, Ser 06/30/2014 0.9  0.4 - 1.2 mg/dL Final  . Calcium 06/30/2014 9.4  8.4 - 10.5 mg/dL Final  . GFR 06/30/2014 64.57  >60.00 mL/min Final  . VITD 06/30/2014 11.40* 30.00 - 100.00 ng/mL Final   Calcium and kidney fxn normal. Vitamin D very low >> start Ergocalciferol 50,000 IU weekly for 8 weeks, then continue 3000 units OTC vit D daily. Will need a vit D level repeated in 3 months.

## 2014-06-30 NOTE — Patient Instructions (Signed)
Please stop at the lab. We will let you know when Prolia is ready for you. Please return in 1 year.  How Can I Prevent Falls? Men and women with osteoporosis need to take care not to fall down. Falls can break bones. Some reasons people fall are: Poor vision  Poor balance  Certain diseases that affect how you walk  Some types of medicine, such as sleeping pills.  Some tips to help prevent falls outdoors are: Use a cane or walker  Wear rubber-soled shoes so you don't slip  Walk on grass when sidewalks are slippery  In winter, put salt or kitty litter on icy sidewalks.  Some ways to help prevent falls indoors are: Keep rooms free of clutter, especially on floors  Use plastic or carpet runners on slippery floors  Wear low-heeled shoes that provide good support  Do not walk in socks, stockings, or slippers  Be sure carpets and area rugs have skid-proof backs or are tacked to the floor  Be sure stairs are well lit and have rails on both sides  Put grab bars on bathroom walls near tub, shower, and toilet  Use a rubber bath mat in the shower or tub  Keep a flashlight next to your bed  Use a sturdy step stool with a handrail and wide steps  Add more lights in rooms (and night lights) Buy a cordless phone to keep with you so that you don't have to rush to the phone       when it rings and so that you can call for help if you fall.   (adapted from http://www.niams.NightlifePreviews.se)  Denosumab: Patient drug information (Up-to-date) Copyright 938 213 [id number] [employer] rights reserved.  Brand Names: [place]  ProliaDelton See What is this drug used for?  It is used to treat soft, brittle bones (osteoporosis).  It is used for bone growth.  It is used when treating some cancers.  It may be given to you for other reasons. Talk with the doctor. What do I need to tell my doctor BEFORE I take this drug?  All products:  If you have an allergy to  denosumab or any other part of this drug.  If you are allergic to any drugs like this one, any other drugs, foods, or other substances. Tell your doctor about the allergy and what signs you had, like rash; hives; itching; shortness of breath; wheezing; cough; swelling of face, lips, tongue, or throat; or any other signs.  If you have low calcium levels.  Prolia:  If you are pregnant or may be pregnant. Do not take this drug if you are pregnant.  This is not a list of all drugs or health problems that interact with this drug.  Tell your doctor and pharmacist about all of your drugs (prescription or OTC, natural products, vitamins) and health problems. You must check to make sure that it is safe for you to take this drug with all of your drugs and health problems. Do not start, stop, or change the dose of any drug without checking with your doctor. What are some things I need to know or do while I take this drug?  All products:  Tell dentists, surgeons, and other doctors that you use this drug.  This drug may raise the chance of a broken leg. Talk with your doctor.  Have your blood work checked. Talk with your doctor.  Have a bone density test. Talk with your doctor.  Take calcium and vitamin  D as you were told by your doctor.  Have a dental exam before starting this drug.  Take good care of your teeth. See a dentist often.  If you smoke, talk with your doctor.  Do not give to a child. Talk with your doctor.  Tell your doctor if you are breast-feeding. You will need to talk about any risks to your baby.  Delton See:  This drug may cause harm to the unborn baby if you take it while you are pregnant. If you get pregnant while taking this drug, call your doctor right away.  Prolia:  Very bad infections have been reported with use of this drug. If you have any infection, are taking antibiotics now or in the recent past, or have many infections, talk with your doctor.  You may have more  chance of getting an infection. Wash hands often. Stay away from people with infections, colds, or flu.  Use birth control that you can trust to prevent pregnancy while taking this drug.  If you are a man and your sex partner is pregnant or gets pregnant at any time while you are being treated, talk with your doctor. What are some side effects that I need to call my doctor about right away?  WARNING/CAUTION: Even though it may be rare, some people may have very bad and sometimes deadly side effects when taking a drug. Tell your doctor or get medical help right away if you have any of the following signs or symptoms that may be related to a very bad side effect:  All products:  Signs of an allergic reaction, like rash; hives; itching; red, swollen, blistered, or peeling skin with or without fever; wheezing; tightness in the chest or throat; trouble breathing or talking; unusual hoarseness; or swelling of the mouth, face, lips, tongue, or throat.  Signs of low calcium levels like muscle cramps or spasms, numbness and tingling, or seizures.  Mouth sores.  Any new or strange groin, hip, or thigh pain.  This drug may cause jawbone problems. The chance may be higher the longer you take this drug. The chance may be higher if you have cancer, dental problems, dentures that do not fit well, anemia, blood clotting problems, or an infection. The chance may also be higher if you are having dental work or if you are getting chemo, some steroid drugs, or radiation. Call your doctor right away if you have jaw swelling or pain.  Xgeva:  Not hungry.  Muscle pain or weakness.  Seizures.  Shortness of breath.  Prolia:  Signs of infection. These include a fever of 100.26F (38C) or higher, chills, very bad sore throat, ear or sinus pain, cough, more sputum or change in color of sputum, pain with passing urine, mouth sores, wound that will not heal, or anal itching or pain.  Signs of a pancreas problem  (pancreatitis) like very bad stomach pain, very bad back pain, or very bad upset stomach or throwing up.  Chest pain.  A heartbeat that does not feel normal.  Very bad skin irritation.  Feeling very tired or weak.  Bladder pain or pain when passing urine or change in how much urine is passed.  Passing urine often.  Swelling in the arms or legs. What are some other side effects of this drug?  All drugs may cause side effects. However, many people have no side effects or only have minor side effects. Call your doctor or get medical help if any of these side effects  or any other side effects bother you or do not go away:  Xgeva:  Feeling tired or weak.  Headache.  Upset stomach or throwing up.  Loose stools (diarrhea).  Cough.  Prolia:  Back pain.  Muscle or joint pain.  Sore throat.  Runny nose.  Pain in arms or legs.  These are not all of the side effects that may occur. If you have questions about side effects, call your doctor. Call your doctor for medical advice about side effects.  You may report side effects to your national health agency. How is this drug best taken?  Use this drug as ordered by your doctor. Read and follow the dosing on the label closely.  It is given as a shot into the fatty part of the skin. What do I do if I miss a dose?  Call the doctor to find out what to do. How do I store and/or throw out this drug?  This drug will be given to you in a hospital or doctor's office. You will not store it at home.  Keep all drugs out of the reach of children and pets.  Check with your pharmacist about how to throw out unused drugs.  General drug facts  If your symptoms or health problems do not get better or if they become worse, call your doctor.  Do not share your drugs with others and do not take anyone else's drugs.  Keep a list of all your drugs (prescription, natural products, vitamins, OTC) with you. Give this list to your doctor.   Talk with the doctor before starting any new drug, including prescription or OTC, natural products, or vitamins.  Some drugs may have another patient information leaflet. If you have any questions about this drug, please talk with your doctor, pharmacist, or other health care provider.  If you think there has been an overdose, call your poison control center or get medical care right away. Be ready to tell or show what was taken, how much, and when it happened.  Exercise for Strong Bones (from [place]) There are two types of exercises that are important for building and maintaining bone density:  weight-bearing and muscle-strengthening exercises. Weight-bearing Exercises These exercises include activities that make you move against gravity while staying upright. Weight-bearing exercises can be high-impact or low-impact. High-impact weight-bearing exercises help build bones and keep them strong. If you have broken a bone due to osteoporosis or are at risk of breaking a bone, you may need to avoid high-impact exercises. If youre not sure, you should check with your healthcare provider. Examples of high-impact weight-bearing exercises are:  Dancing  Doing high-impact aerobics  Hiking  Jogging/running  Jumping Rope  Stair climbing  Tennis Low-impact weight-bearing exercises can also help keep bones strong and are a safe alternative if you cannot do high-impact exercises. Examples of low-impact weight-bearing exercises are:  Using elliptical training machines  Doing low-impact aerobics  Using stair-step machines  Fast walking on a treadmill or outside Muscle-Strengthening Exercises These exercises include activities where you move your body, a weight or some other resistance against gravity. They are also known as resistance exercises and include:  Lifting weights  Using elastic exercise bands  Using weight machines  Lifting your own body  weight  Functional movements, such as standing and rising up on your toes Yoga and Pilates can also improve strength, balance and flexibility. However, certain positions may not be safe for people with osteoporosis or those at increased  risk of broken bones. For example, exercises that have you bend forward may increase the chance of breaking a bone in the spine. A physical therapist should be able to help you learn which exercises are safe and appropriate for you. Non-Impact Exercises Non-impact exercises can help you to improve balance, posture and how well you move in everyday activities. These exercises can also help to increase muscle strength and decrease the risk of falls and broken bones. Some of these exercises include:  Balance exercises that strengthen your legs and test your balance, such as Tai Chi, can decrease your risk of falls.  Posture exercises that improve your posture and reduce rounded or sloping shoulders can help you decrease the chance of breaking a bone, especially in the spine.  Functional exercises that improve how well you move can help you with everyday activities and decrease your chance of falling and breaking a bone. For example, if you have trouble getting up from a chair or climbing stairs, you should do these activities as exercises. A physical therapist can teach you balance, posture and functional exercises. Starting a New Exercise Program If you havent exercised regularly for a while, check with your healthcare provider before beginning a new exercise program--particularly if you have health problems such as heart disease, diabetes or high blood pressure. If youre at high risk of breaking a bone, you should work with a physical therapist to develop a safe exercise program. Once you have your healthcare providers approval, start slowly. If youve already broken bones in the spine because of osteoporosis, be very careful to avoid activities that require  reaching down, bending forward, rapid twisting motions, heavy lifting and those that increase your chance of a fall. As you get started, your muscles may feel sore for a day or two after you exercise. If soreness lasts longer, you may be working too hard and need to ease up. Exercises should be done in a pain-free range of motion. How Much Exercise Do You Need? Weight-bearing exercises 30 minutes on most days of the week. Do a 30-minutesession or multiple sessions spread out throughout the day. The benefits to your bones are the same.   Muscle-strengthening exercises Two to three days per week. If you dont have much time for strengthening/resistance training, do small amounts at a time. You can do just one body part each day. For example do arms one day, legs the next and trunk the next. You can also spread these exercises out during your normal day.  Balance, posture and functional exercises Every day or as often as needed. You may want to focus on one area more than the others. If you have fallen or lose your balance, spend time doing balance exercises. If you are getting rounded shoulders, work more on posture exercises. If you have trouble climbing stairs or getting up from the couch, do more functional exercises. You can also perform these exercises at one time or spread them during your day. Work with a phyiscal therapist to learn the right exercises for you.

## 2014-07-06 ENCOUNTER — Telehealth: Payer: Self-pay | Admitting: Internal Medicine

## 2014-07-06 NOTE — Telephone Encounter (Signed)
I have electronically sent pt's info for [employer] verification and will notify you once I have a response. Thank you.

## 2014-07-06 NOTE — Telephone Encounter (Signed)
Opened in error

## 2014-07-08 ENCOUNTER — Encounter: Payer: Self-pay | Admitting: *Deleted

## 2014-07-08 MED ORDER — VITAMIN D (ERGOCALCIFEROL) 1.25 MG (50000 UNIT) PO CAPS: 50000.0000 [IU] | ORAL_CAPSULE | ORAL | Status: DC

## 2014-07-16 ENCOUNTER — Ambulatory Visit (INDEPENDENT_AMBULATORY_CARE_PROVIDER_SITE_OTHER): Payer: Commercial Managed Care - HMO | Admitting: Internal Medicine

## 2014-07-16 ENCOUNTER — Encounter: Payer: Self-pay | Admitting: Internal Medicine

## 2014-07-16 VITALS — BP 120/88 | HR 90 | Temp 98.7°F | Resp 16 | Ht 63.0 in | Wt 135.0 lb

## 2014-07-16 DIAGNOSIS — K219 Gastro-esophageal reflux disease without esophagitis: Secondary | ICD-10-CM

## 2014-07-16 MED ORDER — PANTOPRAZOLE SODIUM 40 MG PO TBEC: 40.0000 mg | DELAYED_RELEASE_TABLET | Freq: Every day | ORAL | Status: DC

## 2014-07-16 NOTE — Assessment & Plan Note (Signed)
Will start a generic PPI She is concerned about the severity of her symptoms and requests that an EGD be done, will refer to GI for consideration of that

## 2014-07-16 NOTE — Progress Notes (Signed)
Pre visit review using our clinic review tool, if applicable. No additional management support is needed unless otherwise documented below in the visit note. 

## 2014-07-16 NOTE — Patient Instructions (Signed)
Gastroesophageal Reflux Disease, Adult Gastroesophageal reflux disease (GERD) happens when acid from your stomach flows up into the esophagus. When acid comes in contact with the esophagus, the acid causes soreness (inflammation) in the esophagus. Over time, GERD may create small holes (ulcers) in the lining of the esophagus. CAUSES   Increased body weight. This puts pressure on the stomach, making acid rise from the stomach into the esophagus.  Smoking. This increases acid production in the stomach.  Drinking alcohol. This causes decreased pressure in the lower esophageal sphincter (valve or ring of muscle between the esophagus and stomach), allowing acid from the stomach into the esophagus.  Late evening meals and a full stomach. This increases pressure and acid production in the stomach.  A malformed lower esophageal sphincter. Sometimes, no cause is found. SYMPTOMS   Burning pain in the lower part of the mid-chest behind the breastbone and in the mid-stomach area. This may occur twice a week or more often.  Trouble swallowing.  Sore throat.  Dry cough.  Asthma-like symptoms including chest tightness, shortness of breath, or wheezing. DIAGNOSIS  Your caregiver may be able to diagnose GERD based on your symptoms. In some cases, X-rays and other tests may be done to check for complications or to check the condition of your stomach and esophagus. TREATMENT  Your caregiver may recommend over-the-counter or prescription medicines to help decrease acid production. Ask your caregiver before starting or adding any new medicines.  HOME CARE INSTRUCTIONS   Change the factors that you can control. Ask your caregiver for guidance concerning weight loss, quitting smoking, and alcohol consumption.  Avoid foods and drinks that make your symptoms worse, such as:  Caffeine or alcoholic drinks.  Chocolate.  Peppermint or mint flavorings.  Garlic and onions.  Spicy foods.  Citrus fruits,  such as oranges, lemons, or limes.  Tomato-based foods such as sauce, chili, salsa, and pizza.  Fried and fatty foods.  Avoid lying down for the 3 hours prior to your bedtime or prior to taking a nap.  Eat small, frequent meals instead of large meals.  Wear loose-fitting clothing. Do not wear anything tight around your waist that causes pressure on your stomach.  Raise the head of your bed 6 to 8 inches with wood blocks to help you sleep. Extra pillows will not help.  Only take over-the-counter or prescription medicines for pain, discomfort, or fever as directed by your caregiver.  Do not take aspirin, ibuprofen, or other nonsteroidal anti-inflammatory drugs (NSAIDs). SEEK IMMEDIATE MEDICAL CARE IF:   You have pain in your arms, neck, jaw, teeth, or back.  Your pain increases or changes in intensity or duration.  You develop nausea, vomiting, or sweating (diaphoresis).  You develop shortness of breath, or you faint.  Your vomit is green, yellow, black, or looks like coffee grounds or blood.  Your stool is red, bloody, or black. These symptoms could be signs of other problems, such as heart disease, gastric bleeding, or esophageal bleeding. MAKE SURE YOU:   Understand these instructions.  Will watch your condition.  Will get help right away if you are not doing well or get worse. Document Released: 03/29/2005 Document Revised: 09/11/2011 Document Reviewed: 01/06/2011 ExitCare Patient Information 2015 [employer]. This information is not intended to replace advice given to you by your health care provider. Make sure you discuss any questions you have with your health care provider.  

## 2014-07-16 NOTE — Progress Notes (Signed)
Subjective:    Patient ID: Charlene Patterson, female    DOB: 23-Oct-1945, 69 y.o.   MRN: [id number]  Gastrophageal Reflux She complains of belching and heartburn. She reports no abdominal pain, no chest pain, no choking, no coughing, no dysphagia, no early satiety, no globus sensation, no hoarse voice, no nausea, no sore throat, no stridor, no tooth decay, no water brash or no wheezing. This is a recurrent problem. The current episode started more than 1 month ago. The problem occurs frequently. The problem has been gradually worsening. The heartburn is located in the substernum. The heartburn is of moderate intensity. The heartburn does not wake her from sleep. The heartburn does not limit her activity. The heartburn doesn't change with position. Nothing aggravates the symptoms. Pertinent negatives include no anemia, fatigue, melena, muscle weakness, orthopnea or weight loss. She has tried an antacid and a histamine-2 antagonist (she did not try dexilant because it was too expensive) for the symptoms. The treatment provided mild relief.      Review of Systems  Constitutional: Negative.  Negative for chills, weight loss, diaphoresis, fatigue and unexpected weight change.  HENT: Negative.  Negative for hoarse voice and sore throat.   Eyes: Negative.   Respiratory: Negative.  Negative for cough, choking and wheezing.   Cardiovascular: Negative.  Negative for chest pain, palpitations and leg swelling.  Gastrointestinal: Positive for heartburn and constipation. Negative for dysphagia, nausea, abdominal pain, blood in stool, melena and anal bleeding.  Endocrine: Negative.   Genitourinary: Negative.   Musculoskeletal: Negative.  Negative for muscle weakness.  Allergic/Immunologic: Negative.   Neurological: Negative.   Hematological: Negative.  Negative for adenopathy. Does not bruise/bleed easily.  Psychiatric/Behavioral: Negative.   All other systems reviewed and are negative.      Objective:   Physical Exam  Constitutional: She is oriented to person, place, and time. She appears well-developed and well-nourished. No distress.  HENT:  Head: Normocephalic and atraumatic.  Mouth/Throat: Oropharynx is clear and moist. No oropharyngeal exudate.  Eyes: Conjunctivae are normal. Right eye exhibits no discharge. Left eye exhibits no discharge. No scleral icterus.  Neck: Normal range of motion. Neck supple. No JVD present. No tracheal deviation present. No thyromegaly present.  Cardiovascular: Normal rate, regular rhythm, normal heart sounds and intact distal pulses.  Exam reveals no gallop and no friction rub.   No murmur heard. Pulmonary/Chest: Effort normal and breath sounds normal. No stridor. No respiratory distress. She has no wheezes. She has no rales. She exhibits no tenderness.  Abdominal: Soft. Bowel sounds are normal. She exhibits no distension and no mass. There is no tenderness. There is no rebound and no guarding.  Musculoskeletal: Normal range of motion. She exhibits no edema or tenderness.  Lymphadenopathy:    She has no cervical adenopathy.  Neurological: She is oriented to person, place, and time.  Skin: Skin is warm and dry. No rash noted. She is not diaphoretic. No erythema. No pallor.  Vitals reviewed.    Lab Results  Component Value Date   WBC 5.4 06/04/2014   HGB 13.9 06/04/2014   HCT 41.6 06/04/2014   PLT 327.0 06/04/2014   GLUCOSE 87 06/30/2014   CHOL 256* 12/13/2012   TRIG 177.0* 12/13/2012   HDL 49.20 12/13/2012   LDLDIRECT 161.0 12/13/2012   ALT 18 05/25/2014   AST 22 05/25/2014   NA 140 06/30/2014   K 3.7 06/30/2014   CL 106 06/30/2014   CREATININE 0.9 06/30/2014   BUN 15 06/30/2014  CO2 27 06/30/2014   TSH 2.390 05/25/2014       Assessment & Plan:

## 2014-08-03 NOTE — Telephone Encounter (Signed)
I have rec'd pt' Prolia insurance verification, but Mcarthur Rossetti is requiring a prior authorization.  I have filled out most of the P/A form, however, there are some sections that Dr. Cruzita Lederer needs to complete and sign. I have faxed the form to your attn.  Once it is complete, you can fax back to me at [phone].  Thank you.

## 2014-08-03 NOTE — Telephone Encounter (Signed)
Thank you, Rose. I received the form. I will have Dr Cruzita Lederer complete it and fax it back to you.

## 2014-08-05 ENCOUNTER — Ambulatory Visit (INDEPENDENT_AMBULATORY_CARE_PROVIDER_SITE_OTHER): Payer: Commercial Managed Care - HMO | Admitting: Gastroenterology

## 2014-08-05 ENCOUNTER — Encounter: Payer: Self-pay | Admitting: Gastroenterology

## 2014-08-05 VITALS — BP 110/70 | HR 92 | Ht 64.0 in | Wt 138.8 lb

## 2014-08-05 DIAGNOSIS — K529 Noninfective gastroenteritis and colitis, unspecified: Secondary | ICD-10-CM

## 2014-08-05 MED ORDER — POLYETHYLENE GLYCOL 3350 17 GM/SCOOP PO POWD: 1.0000 | Freq: Once | ORAL | Status: DC

## 2014-08-05 NOTE — Progress Notes (Signed)
_                                                                                                                 History of Present Illness:  Charlene Patterson is a 69 year old Afro-American female referred for evaluation of change in bowel habits.  Colonoscopy in 2014 was pertinent only for a small hyperplastic polyp.  In November, 2014 she was hospitalized for hematochezia.  This was preceded by diarrhea.  CT scan, which I reviewed, demonstrated evidence for colitis in the transverse and proximal descending colon.  She was placed on antibiotics.  Stool for C. difficile toxin was negative.  Bleeding promptly stopped but she has continued to have diarrhea.  Up to this point she  suffered from chronic constipation and had a bowel movement about once a week.  Currently she's having daily bowel movements companied by urgency.  Stools are loose.  There is no bleeding.  She may occasionally has severe urgency with incontinence.  She has not taken  any further antibiotics.   Past Medical History  Diagnosis Date  . Depression   . Allergy   . History of chicken pox   . History of colon polyps   . Chronic neck pain   . Chronic back pain   . Sciatica   . Thoracic compression fracture   . Lower GI bleeding   . GERD (gastroesophageal reflux disease)   . History of hiatal hernia   . Migraines     "monthly usually; weekly lately" (05/26/2014)  . Arthritis     "left foot" (05/26/2014)  . Anxiety    Past Surgical History  Procedure Laterality Date  . Polypectomy    . Colonoscopy  2005    last 2005  . Abdominal hysterectomy  ~ 1978    "partial"  . Appendectomy  ~ 1978   family history includes Diabetes in her brother and sister; Heart disease in her brother, father, and mother; Prostate cancer in her father; Stomach cancer (age of onset: 73) in her sister. There is no history of Rectal cancer or Colon cancer. Current Outpatient Prescriptions  Medication Sig Dispense Refill  .  acetaminophen (TYLENOL) 650 MG CR tablet Take 650 mg by mouth every 8 (eight) hours as needed for pain.    . Calcium-Magnesium-Vitamin D (CALCIUM 1200+D3 PO) Take 2 capsules by mouth daily.    . citalopram (CELEXA) 20 MG tablet Take 1 tablet (20 mg total) by mouth daily. 30 tablet 11  . Ferrous Sulfate (RA IRON) 27 MG TABS Take 1 tablet by mouth as needed.    . loratadine (CLARITIN) 10 MG tablet Take 10 mg by mouth at bedtime as needed (helps with sleep and headaches).    . Multiple Vitamin (STRESS FORMULA 500/BIOTIN PO) Take 1 tablet by mouth daily.    . pantoprazole (PROTONIX) 40 MG tablet Take 1 tablet (40 mg total) by mouth daily. 90 tablet 3  . saccharomyces boulardii (FLORASTOR) 250 MG capsule Take 1 capsule (250 mg total) by  mouth 2 (two) times daily. 30 capsule 0  . traMADol (ULTRAM) 50 MG tablet Take 1 tablet (50 mg total) by mouth every 6 (six) hours as needed for moderate pain. 45 tablet 0  . vitamin B-12 (CYANOCOBALAMIN) 1000 MCG tablet Take 1,000 mcg by mouth daily.    . Vitamin D, Ergocalciferol, (DRISDOL) 50000 UNITS CAPS capsule Take 1 capsule (50,000 Units total) by mouth every 7 (seven) days. 8 capsule 0  . vitamin E 400 UNIT capsule Take 400 Units by mouth daily.     No current facility-administered medications for this visit.   Allergies as of 08/05/2014  . (No Known Allergies)    reports that she has never smoked. She has never used smokeless tobacco. She reports that she does not drink alcohol or use illicit drugs.   Review of Systems: Pertinent positive and negative review of systems were noted in the above HPI section. All other review of systems were otherwise negative.  Vital signs were reviewed in today's medical record Physical Exam: General: Well developed , well nourished, no acute distress Skin: anicteric Head: Normocephalic and atraumatic Eyes:  sclerae anicteric, EOMI Ears: Normal auditory acuity Mouth: No deformity or lesions Neck: Supple, no masses or  thyromegaly Lungs: Clear throughout to auscultation Heart: Regular rate and rhythm; no murmurs, rubs or bruits Abdomen: Soft, non tender and non distended. No masses, hepatosplenomegaly or hernias noted. Normal Bowel sounds Rectal:deferred Musculoskeletal: Symmetrical with no gross deformities  Skin: No lesions on visible extremities Pulses:  Normal pulses noted Extremities: No clubbing, cyanosis, edema or deformities noted Neurological: Alert oriented x 4, grossly nonfocal Cervical Nodes:  No significant cervical adenopathy Inguinal Nodes: No significant inguinal adenopathy Psychological:  Alert and cooperative. Normal mood and affect  See Assessment and Plan under Problem List

## 2014-08-05 NOTE — Patient Instructions (Addendum)
You have been scheduled for a colonoscopy. Please follow written instructions given to you at your visit today.  Please pick up your prep kit at the pharmacy within the next 1-3 days. If you use inhalers (even only as needed), please bring them with you on the day of your procedure. Your physician has requested that you go to www.startemmi.com and enter the access code given to you at your visit today. This web site gives a general overview about your procedure. However, you should still follow specific instructions given to you by our office regarding your preparation for the procedure.  We have sent in Miralax to your pharmacy for the prep  Cc Volanda Napoleon

## 2014-08-05 NOTE — Assessment & Plan Note (Addendum)
Patient had an episode of colitis in November, 2015.  Etiology is not certain.  Possibilities include ischemic colitis, infectious colitis or idiopathic colitis.  She has a persistent change in bowel habits raising the question of a persistent colitis.  Recommendations #1 colonoscopy to rule out inflammatory bowel disease #2 repeat stool for C. difficile toxin; if positive cancel colonoscopy

## 2014-08-10 ENCOUNTER — Ambulatory Visit (AMBULATORY_SURGERY_CENTER): Payer: Commercial Managed Care - HMO | Admitting: Gastroenterology

## 2014-08-10 ENCOUNTER — Encounter: Payer: Self-pay | Admitting: Gastroenterology

## 2014-08-10 VITALS — BP 114/56 | HR 68 | Temp 96.7°F | Resp 18 | Ht 64.0 in | Wt 138.0 lb

## 2014-08-10 DIAGNOSIS — K5730 Diverticulosis of large intestine without perforation or abscess without bleeding: Secondary | ICD-10-CM

## 2014-08-10 DIAGNOSIS — K529 Noninfective gastroenteritis and colitis, unspecified: Secondary | ICD-10-CM | POA: Diagnosis not present

## 2014-08-10 DIAGNOSIS — Z86010 Personal history of colonic polyps: Secondary | ICD-10-CM | POA: Diagnosis not present

## 2014-08-10 DIAGNOSIS — M542 Cervicalgia: Secondary | ICD-10-CM | POA: Diagnosis not present

## 2014-08-10 MED ORDER — SODIUM CHLORIDE 0.9 % IV SOLN: 500.0000 mL | INTRAVENOUS | Status: DC

## 2014-08-10 NOTE — Telephone Encounter (Signed)
I have faxed completed p/a form along w/note from [place] on 06/30/2014 to [facility] and will notify you once I have a response. Thank you.

## 2014-08-10 NOTE — Op Note (Signed)
[place]  [address] [place], [id number]   COLONOSCOPY PROCEDURE REPORT  PATIENT: Charlene Patterson, Charlene Patterson  MR#: [id number] BIRTHDATE: [birth year] , 68  yrs. old GENDER: female ENDOSCOPIST: Inda Castle, MD REFERRED XB:[id number] Evalina Field, M.D. PROCEDURE DATE:  08/10/2014 PROCEDURE:   Colonoscopy, diagnostic First Screening Colonoscopy - Avg.  risk and is 50 yrs.  old or older - No.  Prior Negative Screening - Now for repeat screening. Other: See Comments  History of Adenoma - Now for follow-up colonoscopy & has been > or = to 3 yrs.  N/A  Polyps Removed Today? No.  Recommend repeat exam, <10 yrs? No. ASA CLASS:   Class II INDICATIONS:an abnormal CT.Mr. any colitis several months ago in conjunction with diarrhea and rectal bleeding MEDICATIONS: Monitored anesthesia care and Propofol 270 mg IV  DESCRIPTION OF PROCEDURE:   After the risks benefits and alternatives of the procedure were thoroughly explained, informed consent was obtained.  The digital rectal exam revealed no abnormalities of the rectum.   The LB [id number]  endoscope was introduced through the anus and advanced to the cecum, which was identified by both the appendix and ileocecal valve. No adverse events experienced.   The quality of the prep was excellent using Suprep  The instrument was then slowly withdrawn as the colon was fully examined.      COLON FINDINGS: There was mild diverticulosis noted in the ascending colon and sigmoid colon.   The examination was otherwise normal. Retroflexed views revealed no abnormalities. The time to cecum=6 minutes 25 seconds.  Withdrawal time=7 minutes 06 seconds.  The scope was withdrawn and the procedure completed. COMPLICATIONS: There were no immediate complications.  ENDOSCOPIC IMPRESSION: 1.   Mild diverticulosis was noted in the ascending colon and sigmoid colon 2.   The examination was otherwise normal  no evidence for residual  colitis  RECOMMENDATIONS: Fiber supplementation daily Office visit 6-8 weeks colonoscopy 10 years  eSigned:  Inda Castle, MD 08/10/2014 4:18 PM   cc:   PATIENT NAME:  Charlene Patterson, Charlene Patterson MR#: [id number]

## 2014-08-10 NOTE — Progress Notes (Signed)
Procedure ends, to recovery, report given and VSS. 

## 2014-08-10 NOTE — Patient Instructions (Signed)
YOU HAD AN ENDOSCOPIC PROCEDURE TODAY AT THE [facility]: Refer to the procedure report that was given to you for any specific questions about what was found during the examination.  If the procedure report does not answer your questions, please call your gastroenterologist to clarify.  If you requested that your care partner not be given the details of your procedure findings, then the procedure report has been included in a sealed envelope for you to review at your convenience later.  YOU SHOULD EXPECT: Some feelings of bloating in the abdomen. Passage of more gas than usual.  Walking can help get rid of the air that was put into your GI tract during the procedure and reduce the bloating. If you had a lower endoscopy (such as a colonoscopy or flexible sigmoidoscopy) you may notice spotting of blood in your stool or on the toilet paper. If you underwent a bowel prep for your procedure, then you may not have a normal bowel movement for a few days.  DIET: Your first meal following the procedure should be a light meal and then it is ok to progress to your normal diet.  A half-sandwich or bowl of soup is an example of a good first meal.  Heavy or fried foods are harder to digest and may make you feel nauseous or bloated.  Likewise meals heavy in dairy and vegetables can cause extra gas to form and this can also increase the bloating.  Drink plenty of fluids but you should avoid alcoholic beverages for 24 hours.  ACTIVITY: Your care partner should take you home directly after the procedure.  You should plan to take it easy, moving slowly for the rest of the day.  You can resume normal activity the day after the procedure however you should NOT DRIVE or use heavy machinery for 24 hours (because of the sedation medicines used during the test).    SYMPTOMS TO REPORT IMMEDIATELY: A gastroenterologist can be reached at any hour.  During normal business hours, 8:30 AM to 5:00 PM Monday through Friday,  call [phone].  After hours and on weekends, please call the GI answering service at [phone] who will take a message and have the physician on call contact you.   Following lower endoscopy (colonoscopy or flexible sigmoidoscopy):  Excessive amounts of blood in the stool  Significant tenderness or worsening of abdominal pains  Swelling of the abdomen that is new, acute  Fever of 100F or higher  FOLLOW UP: If any biopsies were taken you will be contacted by phone or by letter within the next 1-3 weeks.  Call your gastroenterologist if you have not heard about the biopsies in 3 weeks.  Our staff will call the home number listed on your records the next business day following your procedure to check on you and address any questions or concerns that you may have at that time regarding the information given to you following your procedure. This is a courtesy call and so if there is no answer at the home number and we have not heard from you through the emergency physician on call, we will assume that you have returned to your regular daily activities without incident.  SIGNATURES/CONFIDENTIALITY: You and/or your care partner have signed paperwork which will be entered into your electronic medical record.  These signatures attest to the fact that that the information above on your After Visit Summary has been reviewed and is understood.  Full responsibility of the confidentiality of this   discharge information lies with you and/or your care-partner.    Resume medications. Information given on diverticulosis and high fiber diet with discharge instructions.  Over the counter fiber supplement daily, Call office to schedule 6-8 weeks follow up.

## 2014-08-11 ENCOUNTER — Telehealth: Payer: Self-pay | Admitting: *Deleted

## 2014-08-11 NOTE — Telephone Encounter (Signed)
No answer. No identifier. Message left to call if questions or concerns. 

## 2014-08-13 LAB — HM COLONOSCOPY: HM COLON: NORMAL

## 2014-09-02 ENCOUNTER — Encounter: Payer: Self-pay | Admitting: *Deleted

## 2014-09-02 NOTE — Telephone Encounter (Signed)
Finally have authorization from insurance.  [employer] has authorized Prol[employer] for TWO years.  The 1st year auth # is [id number] and is effective 08/03/2014 thru 08/04/2015. The 2nd year auth # is [id number] and is effective 08/05/2015 thru 08/03/2016.  We will still need to run the verification each time just to be sure the benefits haven't changed, but we won't have to wait on authorization from [facility].  If an OV is billed pt will have an estimated responsibility of a $45 co-pay plus 20% co-insurance (approximately $180) for an estimated total responsibility of $225; if no OV is billed then pt will have an estimated responsibility of a 20% co-insurance, which is approximately $180.  Please make pt aware this is only an estimate and we will not know an exact amt until her insurance has paid.  I have sent a copy of the summary of benefits to be scanned into her chart.    If Ms. Sizelove cannot afford [phone] for her injection then please ask her to call [phone] and choose option 1 to see if she qualifies for one of their assistance programs to get financial assistance w/her injection. If you have any questions, please let me know. Thank you.

## 2014-09-23 ENCOUNTER — Other Ambulatory Visit (INDEPENDENT_AMBULATORY_CARE_PROVIDER_SITE_OTHER): Payer: Commercial Managed Care - HMO

## 2014-09-23 DIAGNOSIS — E559 Vitamin D deficiency, unspecified: Secondary | ICD-10-CM | POA: Diagnosis not present

## 2014-09-23 LAB — VITAMIN D 25 HYDROXY (VIT D DEFICIENCY, FRACTURES): VITD: 20.2 ng/mL — AB (ref 30.00–100.00)

## 2014-09-24 ENCOUNTER — Other Ambulatory Visit: Payer: Self-pay | Admitting: Internal Medicine

## 2014-09-24 DIAGNOSIS — E559 Vitamin D deficiency, unspecified: Secondary | ICD-10-CM | POA: Insufficient documentation

## 2014-09-24 HISTORY — DX: Vitamin D deficiency, unspecified: E55.9

## 2014-09-24 MED ORDER — VITAMIN D (ERGOCALCIFEROL) 1.25 MG (50000 UNIT) PO CAPS: 50000.0000 [IU] | ORAL_CAPSULE | ORAL | Status: DC

## 2014-09-25 ENCOUNTER — Other Ambulatory Visit: Payer: Commercial Managed Care - HMO

## 2014-10-08 ENCOUNTER — Other Ambulatory Visit: Payer: Self-pay | Admitting: *Deleted

## 2014-10-08 ENCOUNTER — Telehealth: Payer: Self-pay | Admitting: Internal Medicine

## 2014-10-08 MED ORDER — VITAMIN D (ERGOCALCIFEROL) 1.25 MG (50000 UNIT) PO CAPS: 50000.0000 [IU] | ORAL_CAPSULE | ORAL | Status: DC

## 2014-10-08 NOTE — Telephone Encounter (Signed)
Done

## 2014-10-08 NOTE — Telephone Encounter (Signed)
Opened encounter in error  

## 2014-10-08 NOTE — Telephone Encounter (Signed)
Patient ask if you could resend meds for Vitamin D, Ergocalciferol, (DRISDOL) 50000 UNITS CAPS capsule,  To [facility].

## 2014-10-29 ENCOUNTER — Ambulatory Visit (INDEPENDENT_AMBULATORY_CARE_PROVIDER_SITE_OTHER): Payer: Commercial Managed Care - HMO | Admitting: Internal Medicine

## 2014-10-29 ENCOUNTER — Encounter: Payer: Self-pay | Admitting: Internal Medicine

## 2014-10-29 VITALS — BP 110/82 | HR 82 | Temp 98.2°F | Resp 16 | Ht 64.0 in | Wt 137.0 lb

## 2014-10-29 DIAGNOSIS — R5383 Other fatigue: Secondary | ICD-10-CM | POA: Diagnosis not present

## 2014-10-29 DIAGNOSIS — E559 Vitamin D deficiency, unspecified: Secondary | ICD-10-CM

## 2014-10-29 DIAGNOSIS — F329 Major depressive disorder, single episode, unspecified: Secondary | ICD-10-CM

## 2014-10-29 DIAGNOSIS — K2100 Gastro-esophageal reflux disease with esophagitis, without bleeding: Secondary | ICD-10-CM

## 2014-10-29 DIAGNOSIS — F32A Depression, unspecified: Secondary | ICD-10-CM

## 2014-10-29 DIAGNOSIS — E785 Hyperlipidemia, unspecified: Secondary | ICD-10-CM | POA: Diagnosis not present

## 2014-10-29 DIAGNOSIS — F450 Somatization disorder: Secondary | ICD-10-CM

## 2014-10-29 DIAGNOSIS — I493 Ventricular premature depolarization: Secondary | ICD-10-CM

## 2014-10-29 HISTORY — DX: Ventricular premature depolarization: I49.3

## 2014-10-29 HISTORY — DX: Hyperlipidemia, unspecified: E78.5

## 2014-10-29 MED ORDER — CITALOPRAM HYDROBROMIDE 10 MG PO TABS: 10.0000 mg | ORAL_TABLET | Freq: Every day | ORAL | Status: DC

## 2014-10-29 MED ORDER — MIRTAZAPINE 7.5 MG PO TABS: 7.5000 mg | ORAL_TABLET | Freq: Every day | ORAL | Status: DC

## 2014-10-29 NOTE — Patient Instructions (Signed)

## 2014-10-29 NOTE — Progress Notes (Signed)
Subjective:    Patient ID: Charlene Patterson, female    DOB: 09-02-1945, 69 y.o.   MRN: [id number]  Palpitations  This is a recurrent problem. The current episode started more than 1 month ago. The problem occurs intermittently. The problem has been unchanged. Nothing aggravates the symptoms. Associated symptoms include anxiety, an irregular heartbeat, malaise/fatigue and weakness. Pertinent negatives include no chest fullness, chest pain, coughing, diaphoresis, dizziness, fever, nausea, near-syncope, numbness, shortness of breath, syncope or vomiting. She has tried nothing for the symptoms. The treatment provided no relief. Her past medical history is significant for anxiety. There is no history of anemia, drug use, heart disease, hyperthyroidism or a valve disorder.      Review of Systems  Constitutional: Positive for malaise/fatigue. Negative for fever, chills, diaphoresis, appetite change and fatigue.  HENT: Negative.   Eyes: Negative.   Respiratory: Negative.  Negative for cough, choking, chest tightness and shortness of breath.   Cardiovascular: Positive for palpitations. Negative for chest pain, leg swelling, syncope and near-syncope.  Gastrointestinal: Negative.  Negative for nausea, vomiting, abdominal pain, diarrhea and constipation.  Endocrine: Negative.   Genitourinary: Negative.   Musculoskeletal: Negative.  Negative for myalgias, back pain, joint swelling and arthralgias.  Skin: Negative.   Allergic/Immunologic: Negative.   Neurological: Positive for weakness. Negative for dizziness, speech difficulty, light-headedness and numbness.  Hematological: Negative.  Negative for adenopathy. Does not bruise/bleed easily.  Psychiatric/Behavioral: Positive for sleep disturbance and dysphoric mood. Negative for suicidal ideas, hallucinations, behavioral problems, confusion, self-injury, decreased concentration and agitation. The patient is nervous/anxious. The patient is not hyperactive.          She complains of crying spells, anhedonia, insomnia (FA and EMA), loss of appetite, and sadness.       Objective:   Physical Exam  Constitutional: She is oriented to person, place, and time. She appears well-developed and well-nourished. No distress.  HENT:  Head: Normocephalic and atraumatic.  Mouth/Throat: Oropharynx is clear and moist. No oropharyngeal exudate.  Eyes: Conjunctivae are normal. Right eye exhibits no discharge. Left eye exhibits no discharge. No scleral icterus.  Neck: Normal range of motion. Neck supple. No JVD present. No tracheal deviation present. No thyromegaly present.  Cardiovascular: Normal rate, regular rhythm, normal heart sounds and intact distal pulses.   Occasional extrasystoles are present. Exam reveals no gallop and no friction rub.   No murmur heard. Pulmonary/Chest: Effort normal and breath sounds normal. No stridor. No respiratory distress. She has no wheezes. She has no rales. She exhibits no tenderness.  Abdominal: Soft. Bowel sounds are normal. She exhibits no distension and no mass. There is no tenderness. There is no rebound and no guarding.  Musculoskeletal: Normal range of motion. She exhibits no edema or tenderness.  Lymphadenopathy:    She has no cervical adenopathy.  Neurological: She is oriented to person, place, and time.  Skin: Skin is warm and dry. No rash noted. She is not diaphoretic. No erythema. No pallor.  Psychiatric: Her speech is normal and behavior is normal. Judgment and thought content normal. Her mood appears anxious. Her affect is not angry, not blunt, not labile and not inappropriate. Thought content is not paranoid. Cognition and memory are normal. She exhibits a depressed mood. She expresses no homicidal and no suicidal ideation. She expresses no suicidal plans and no homicidal plans.  She is crying today      Lab Results  Component Value Date   WBC 5.4 06/04/2014   HGB 13.9 06/04/2014  HCT 41.6 06/04/2014   PLT  327.0 06/04/2014   GLUCOSE 87 06/30/2014   CHOL 256* 12/13/2012   TRIG 177.0* 12/13/2012   HDL 49.20 12/13/2012   LDLDIRECT 161.0 12/13/2012   ALT 18 05/25/2014   AST 22 05/25/2014   NA 140 06/30/2014   K 3.7 06/30/2014   CL 106 06/30/2014   CREATININE 0.9 06/30/2014   BUN 15 06/30/2014   CO2 27 06/30/2014   TSH 2.390 05/25/2014      Assessment & Plan:

## 2014-10-29 NOTE — Progress Notes (Signed)
Pre visit review using our clinic review tool, if applicable. No additional management support is needed unless otherwise documented below in the visit note. 

## 2014-10-31 NOTE — Assessment & Plan Note (Signed)
She is due for a FLP today Will consider starting a statin based on the results

## 2014-10-31 NOTE — Assessment & Plan Note (Signed)
She is not getting much symptom relief with celexa so will start to taper it I think mirtazapine is a good option for her - will layer that over the celexa while it is being tapered and will increase the mirtazapine dose over time

## 2014-10-31 NOTE — Assessment & Plan Note (Addendum)
Her EKG shows Sinus  Rhythm  - frequent multiform ectopic ventricular beats  # VECs = 5, # types 2 -  Negative T-waves  -Possible  Anterior  ischemia.   Low voltage with rightward P-axis and rotation -possible pulmonary disease.  She is symptomatic but appears stable with no s/s of ischemia Will check her labs to screen for metabolic causes Will refer to cardiology for further evaluation

## 2014-11-20 ENCOUNTER — Telehealth: Payer: Self-pay

## 2014-11-20 NOTE — Telephone Encounter (Signed)
Patient called to educate on Medicare Wellness apt. LVM for the patient to call back to educate and schedule for wellness visit.   

## 2014-11-26 NOTE — Telephone Encounter (Signed)
Left 2nd message to call regarding apt for AWV/ given call back information

## 2014-12-07 ENCOUNTER — Ambulatory Visit (INDEPENDENT_AMBULATORY_CARE_PROVIDER_SITE_OTHER): Payer: Commercial Managed Care - HMO | Admitting: Cardiology

## 2014-12-07 ENCOUNTER — Encounter: Payer: Self-pay | Admitting: Cardiology

## 2014-12-07 VITALS — BP 126/78 | HR 81 | Ht 64.0 in | Wt 145.4 lb

## 2014-12-07 DIAGNOSIS — E7800 Pure hypercholesterolemia, unspecified: Secondary | ICD-10-CM

## 2014-12-07 DIAGNOSIS — R5383 Other fatigue: Secondary | ICD-10-CM

## 2014-12-07 DIAGNOSIS — I493 Ventricular premature depolarization: Secondary | ICD-10-CM

## 2014-12-07 DIAGNOSIS — R0609 Other forms of dyspnea: Secondary | ICD-10-CM | POA: Diagnosis not present

## 2014-12-07 DIAGNOSIS — R0789 Other chest pain: Secondary | ICD-10-CM

## 2014-12-07 LAB — HEPATIC FUNCTION PANEL
ALBUMIN: 4.3 g/dL (ref 3.5–5.2)
ALK PHOS: 139 U/L — AB (ref 39–117)
ALT: 16 U/L (ref 0–35)
AST: 16 U/L (ref 0–37)
Bilirubin, Direct: 0.1 mg/dL (ref 0.0–0.3)
Total Bilirubin: 0.5 mg/dL (ref 0.2–1.2)
Total Protein: 7.1 g/dL (ref 6.0–8.3)

## 2014-12-07 LAB — CBC WITH DIFFERENTIAL/PLATELET
BASOS ABS: 0 10*3/uL (ref 0.0–0.1)
BASOS PCT: 0.2 % (ref 0.0–3.0)
EOS PCT: 0.1 % (ref 0.0–5.0)
Eosinophils Absolute: 0 10*3/uL (ref 0.0–0.7)
HEMATOCRIT: 38.2 % (ref 36.0–46.0)
HEMOGLOBIN: 13 g/dL (ref 12.0–15.0)
LYMPHS ABS: 1.8 10*3/uL (ref 0.7–4.0)
LYMPHS PCT: 32.4 % (ref 12.0–46.0)
MCHC: 34 g/dL (ref 30.0–36.0)
MCV: 86.1 fl (ref 78.0–100.0)
Monocytes Absolute: 0.4 10*3/uL (ref 0.1–1.0)
Monocytes Relative: 7.5 % (ref 3.0–12.0)
Neutro Abs: 3.4 10*3/uL (ref 1.4–7.7)
Neutrophils Relative %: 59.8 % (ref 43.0–77.0)
PLATELETS: 293 10*3/uL (ref 150.0–400.0)
RBC: 4.43 Mil/uL (ref 3.87–5.11)
RDW: 14.7 % (ref 11.5–15.5)
WBC: 5.6 10*3/uL (ref 4.0–10.5)

## 2014-12-07 LAB — MAGNESIUM: MAGNESIUM: 2.1 mg/dL (ref 1.5–2.5)

## 2014-12-07 LAB — BASIC METABOLIC PANEL
BUN: 16 mg/dL (ref 6–23)
CO2: 26 mEq/L (ref 19–32)
Calcium: 9.7 mg/dL (ref 8.4–10.5)
Chloride: 106 mEq/L (ref 96–112)
Creatinine, Ser: 0.87 mg/dL (ref 0.40–1.20)
GFR: 68.78 mL/min (ref >60.00)
Glucose, Bld: 94 mg/dL (ref 70–99)
POTASSIUM: 3.6 meq/L (ref 3.5–5.1)
Sodium: 137 mEq/L (ref 135–145)

## 2014-12-07 LAB — LIPID PANEL
CHOL/HDL RATIO: 9
Cholesterol: 360 mg/dL — ABNORMAL HIGH (ref 0–200)
HDL: 40.2 mg/dL (ref >39.00)
Triglycerides: 824 mg/dL — ABNORMAL HIGH (ref 0.0–149.0)

## 2014-12-07 LAB — LDL CHOLESTEROL, DIRECT: LDL DIRECT: 84 mg/dL

## 2014-12-07 NOTE — Progress Notes (Signed)
Cardiology Office Note   Date:  12/07/2014   ID:  Charlene Patterson, DOB [birth year], MRN [id number]  PCP:  Scarlette Calico, MD  Cardiologist: Darlin Coco MD  No chief complaint on file.     History of Present Illness: Charlene Patterson is a 69 y.o. female who presents for cardiology evaluation.  She is seen at the request of her internist Dr. Scarlette Calico.  The patient reminds me that we saw her at [facility] cardiology about 18 years ago.  At that time she had a normal stress test.  Those records are not currently available.  She comes in now because of concern over exertional chest tightness and dyspnea.  She notes shortness of breath climbing stairs or walking up hills.  She also notes a tightness in the chest.  She has also had bilateral arm numbness worse in the right arm than in the left.  She sometimes feels her heart skipping a beat.  She can often hear her heartbeat in her right ear. Her social history reveals that she is a widow.  She retired from work in 2005 so she could help look after her husband who had multiple comorbidities and finally died of high blood pressure, strokes, and end-stage renal disease and lung cancer. The patient does not smoke.  She does not use alcohol. Patient does have a history of high cholesterol.  Her LDL cholesterol in June 2014 was 161. She has a history of GI bleeding and was hospitalized in October or November 2015 with hematochezia attributed to diverticulosis. Social history reveals that she has a son with cerebral palsy.  The patient and her son live in her old home.  They're considering moving because they can no longer keep up with a large yard.    Past Medical History  Diagnosis Date  . Depression   . Allergy   . History of chicken pox   . History of colon polyps   . Chronic neck pain   . Chronic back pain   . Sciatica   . Thoracic compression fracture   . Lower GI bleeding   . GERD (gastroesophageal reflux disease)   . History of  hiatal hernia   . Migraines     "monthly usually; weekly lately" (05/26/2014)  . Arthritis     "left foot" (05/26/2014)  . Anxiety     Past Surgical History  Procedure Laterality Date  . Polypectomy    . Colonoscopy  2005    last 2005  . Abdominal hysterectomy  ~ 1978    "partial"  . Appendectomy  ~ 1978     Current Outpatient Prescriptions  Medication Sig Dispense Refill  . acetaminophen (TYLENOL) 650 MG CR tablet Take 650 mg by mouth every 8 (eight) hours as needed for pain.    . Calcium-Magnesium-Vitamin D (CALCIUM 1200+D3 PO) Take 2 capsules by mouth daily.    . citalopram (CELEXA) 10 MG tablet Take 1 tablet (10 mg total) by mouth daily. 30 tablet 2  . Ferrous Sulfate (RA IRON) 27 MG TABS Take 1 tablet by mouth as needed (low iron level).     Marland Kitchen loratadine (CLARITIN) 10 MG tablet Take 10 mg by mouth at bedtime as needed (helps with sleep and headaches).    . mirtazapine (REMERON) 7.5 MG tablet Take 1 tablet (7.5 mg total) by mouth at bedtime. 30 tablet 2  . Multiple Vitamin (STRESS FORMULA 500/BIOTIN PO) Take 1 tablet by mouth daily.    . pantoprazole (  PROTONIX) 40 MG tablet Take 1 tablet (40 mg total) by mouth daily. 90 tablet 3  . saccharomyces boulardii (FLORASTOR) 250 MG capsule Take 1 capsule (250 mg total) by mouth 2 (two) times daily. 30 capsule 0  . traMADol (ULTRAM) 50 MG tablet Take 1 tablet (50 mg total) by mouth every 6 (six) hours as needed for moderate pain. 45 tablet 0  . vitamin B-12 (CYANOCOBALAMIN) 1000 MCG tablet Take 1,000 mcg by mouth daily.    . Vitamin D, Ergocalciferol, (DRISDOL) 50000 UNITS CAPS capsule Take 1 capsule (50,000 Units total) by mouth every 7 (seven) days. 8 capsule 2  . vitamin E 400 UNIT capsule Take 400 Units by mouth daily.     No current facility-administered medications for this visit.    Allergies:   Review of patient's allergies indicates no known allergies.    Social History:  The patient  reports that she has never smoked.  She has never used smokeless tobacco. She reports that she does not drink alcohol or use illicit drugs.   Family History:  The patient's family history includes Diabetes in her brother and sister; Heart disease in her brother, father, and mother; Prostate cancer in her father; Stomach cancer (age of onset: 64) in her sister. There is no history of Rectal cancer or Colon cancer. her mother had a history of heart attacks and had a defibrillator and died at age 65.  Her father died from a combination of cancer and had a previous heart attack.  She had a brother in his late 110s to died suddenly of a heart attack.   ROS:  Please see the history of present illness.   Otherwise, review of systems are positive for none.   All other systems are reviewed and negative.    PHYSICAL EXAM: VS:  BP 126/78 mmHg  Pulse 81  Ht 5\' 4"  (1.626 m)  Wt 145 lb 6.4 oz (65.953 kg)  BMI 24.95 kg/m2 , BMI Body mass index is 24.95 kg/(m^2). GEN: Well nourished, well developed, in no acute distress HEENT: normal Neck: no JVD, carotid bruits, or masses Cardiac: RRR; no murmurs, rubs, or gallops,no edema  Respiratory:  clear to auscultation bilaterally, normal work of breathing GI: soft, nontender, nondistended, + BS MS: no deformity or atrophy Skin: warm and dry, no rash Neuro:  Strength and sensation are intact Psych: euthymic mood, full affect   EKG:  EKG is ordered today. The ekg ordered today demonstrates normal sinus rhythm with low-voltage QRS and nonspecific T-wave abnormality.   Recent Labs: 05/25/2014: TSH 2.390 12/07/2014: ALT 16; BUN 16; Creatinine 0.87; Hemoglobin 13.0; Magnesium 2.1; Platelets 293.0; Potassium 3.6; Sodium 137    Lipid Panel    Component Value Date/Time   CHOL 360* 12/07/2014 1602   TRIG * 12/07/2014 1602    824.0 Triglyceride is over 400; calculations on Lipids are invalid.   HDL 40.20 12/07/2014 1602   CHOLHDL 9 12/07/2014 1602   VLDL 35.4 12/13/2012 1036   LDLDIRECT 84.0  12/07/2014 1602      Wt Readings from Last 3 Encounters:  12/07/14 145 lb 6.4 oz (65.953 kg)  10/29/14 137 lb (62.143 kg)  08/10/14 138 lb (62.596 kg)         ASSESSMENT AND PLAN:  1.  Chest pain and exertional dyspnea uncertain etiology 2.  Hyperlipidemia 3.  Strong family history of coronary disease 4.  History of PVCs  Plan: We are checking lab work today.  We will update her lipids  as well as check CBC and basal metabolic panel hepatic function panel and magnesium.  We will have her return for a treadmill Myoview stress test.  Current medicines are reviewed at length with the patient today.  The patient does not have concerns regarding medicines.  The following changes have been made:  no change  Labs/ tests ordered today include:   Orders Placed This Encounter  Procedures  . Lipid panel  . Hepatic function panel  . Basic metabolic panel  . CBC with Differential/Platelet  . Magnesium  . LDL cholesterol, direct  . Myocardial Perfusion Imaging  . EKG 12-Lead     Plan: Recheck in 3 months for follow-up office visit. Many thanks for the opportunity to see this pleasant woman with you.  We will be in touch regarding the results of her Myoview.  Berna Spare MD 12/07/2014 5:30 PM    [address], [place], [place]  [address] Phone: [phone]; Fax: [phone]

## 2014-12-07 NOTE — Patient Instructions (Addendum)
Medication Instructions:  Your physician recommends that you continue on your current medications as directed. Please refer to the Current Medication list given to you today.  Labwork: LP/BMET/HFP/CBC/MAGNESIUM  Testing/Procedures: Your physician has requested that you have an exercise stress myoview. For further information please visit [email or web address]. Please follow instruction sheet, as given.  Follow-Up: 3 MONTH OV

## 2014-12-08 ENCOUNTER — Telehealth: Payer: Self-pay | Admitting: Cardiology

## 2014-12-08 DIAGNOSIS — E7800 Pure hypercholesterolemia, unspecified: Secondary | ICD-10-CM

## 2014-12-08 MED ORDER — ATORVASTATIN CALCIUM 80 MG PO TABS: 80.0000 mg | ORAL_TABLET | Freq: Every day | ORAL | Status: DC

## 2014-12-08 NOTE — Telephone Encounter (Signed)
New message ° ° ° ° °Want lab results °

## 2014-12-08 NOTE — Telephone Encounter (Signed)
Called patient with lab results. She is aware per Dr. Mare Ferrari to start Lipitor 80mg  every day and return for fasting labs on 02/09/2015.

## 2014-12-10 ENCOUNTER — Other Ambulatory Visit (INDEPENDENT_AMBULATORY_CARE_PROVIDER_SITE_OTHER): Payer: Commercial Managed Care - HMO

## 2014-12-10 DIAGNOSIS — E559 Vitamin D deficiency, unspecified: Secondary | ICD-10-CM

## 2014-12-10 LAB — VITAMIN D 25 HYDROXY (VIT D DEFICIENCY, FRACTURES): VITD: 27.19 ng/mL — ABNORMAL LOW (ref 30.00–100.00)

## 2014-12-22 ENCOUNTER — Telehealth (HOSPITAL_COMMUNITY): Payer: Self-pay | Admitting: *Deleted

## 2014-12-22 NOTE — Telephone Encounter (Signed)
Left message on voicemail per DPR in reference to upcoming appointment scheduled on 12/24/14 at [facility] with detailed instructions given per Myocardial Perfusion Study Information Sheet for the test. LM to arrive 15 minutes early, and that it is imperative to arrive on time for appointment to keep from having the test rescheduled.Phone number given for call back for any questions. Patient was initially scheduled for 1145 on 12/24/14. Left message for patient to call back to change appointment  to 0745.Frances Ambrosino, Ranae Palms

## 2014-12-24 ENCOUNTER — Ambulatory Visit (HOSPITAL_COMMUNITY): Payer: Commercial Managed Care - HMO | Attending: Internal Medicine

## 2014-12-24 DIAGNOSIS — R002 Palpitations: Secondary | ICD-10-CM | POA: Diagnosis not present

## 2014-12-24 DIAGNOSIS — R0789 Other chest pain: Secondary | ICD-10-CM | POA: Diagnosis not present

## 2014-12-24 DIAGNOSIS — R0609 Other forms of dyspnea: Secondary | ICD-10-CM | POA: Insufficient documentation

## 2014-12-24 LAB — MYOCARDIAL PERFUSION IMAGING
CHL CUP MPHR: 152 {beats}/min
CHL CUP NUCLEAR SRS: 2
CHL CUP NUCLEAR SSS: 4
CHL CUP RESTING HR STRESS: 59 {beats}/min
CSEPEW: 4.6 METS
CSEPHR: 93 %
Exercise duration (min): 4 min
Exercise duration (sec): 0 s
LV dias vol: 70 mL
LV sys vol: 32 mL
NUC STRESS TID: 0.9
Peak HR: 142 {beats}/min
RATE: 0.34
SDS: 2

## 2014-12-24 MED ORDER — TECHNETIUM TC 99M SESTAMIBI GENERIC - CARDIOLITE
33.0000 | Freq: Once | INTRAVENOUS | Status: AC | PRN
  Administered 2014-12-24: 33 via INTRAVENOUS

## 2014-12-24 MED ORDER — TECHNETIUM TC 99M SESTAMIBI GENERIC - CARDIOLITE
11.0000 | Freq: Once | INTRAVENOUS | Status: AC | PRN
  Administered 2014-12-24: 11 via INTRAVENOUS

## 2014-12-28 ENCOUNTER — Other Ambulatory Visit: Payer: Self-pay | Admitting: Internal Medicine

## 2014-12-30 ENCOUNTER — Telehealth: Payer: Self-pay | Admitting: Cardiology

## 2014-12-30 NOTE — Telephone Encounter (Signed)
New Message  Pt called for Myocardial Perfusion results//sr

## 2014-12-30 NOTE — Telephone Encounter (Signed)
Follow Up  Follow Up pt called back for results again. Please call

## 2014-12-30 NOTE — Telephone Encounter (Signed)
Advised patient of results.  

## 2014-12-30 NOTE — Telephone Encounter (Signed)
-----   Message from Darlin Coco, MD sent at 12/25/2014  9:46 PM EDT ----- Please report.  The stress test is normal. No ischemia.Send copy to PCP.

## 2015-01-28 ENCOUNTER — Other Ambulatory Visit: Payer: Self-pay | Admitting: Internal Medicine

## 2015-02-09 ENCOUNTER — Other Ambulatory Visit: Payer: Commercial Managed Care - HMO

## 2015-03-12 ENCOUNTER — Ambulatory Visit (INDEPENDENT_AMBULATORY_CARE_PROVIDER_SITE_OTHER): Payer: Medicare HMO | Admitting: Cardiology

## 2015-03-12 ENCOUNTER — Encounter: Payer: Self-pay | Admitting: Cardiology

## 2015-03-12 VITALS — BP 116/80 | HR 89 | Ht 64.0 in | Wt 146.8 lb

## 2015-03-12 DIAGNOSIS — R0609 Other forms of dyspnea: Secondary | ICD-10-CM | POA: Diagnosis not present

## 2015-03-12 DIAGNOSIS — R0789 Other chest pain: Secondary | ICD-10-CM | POA: Diagnosis not present

## 2015-03-12 DIAGNOSIS — I493 Ventricular premature depolarization: Secondary | ICD-10-CM

## 2015-03-12 DIAGNOSIS — E7800 Pure hypercholesterolemia, unspecified: Secondary | ICD-10-CM

## 2015-03-12 NOTE — Progress Notes (Signed)
Cardiology Office Note   Date:  03/12/2015   ID:  Charlene Patterson, DOB [birth year], MRN [id number]  PCP:  Charlene Calico, MD  Cardiologist: Charlene Coco MD  No chief complaint on file.     History of Present Illness: Charlene Patterson is a 69 y.o. female who presents for follow-up office visit Charlene Patterson is a 69 y.o. female who presents for cardiology evaluation. She had been seen at the request of her internist Dr. Scarlette Patterson. The patient reminds me that we saw her at [facility] cardiology about 18 years ago. At that time she had a normal stress test. Those records are not currently available. She comes in now because of concern over exertional chest tightness and dyspnea. She notes shortness of breath climbing stairs or walking up hills. She also notes a tightness in the chest. She has also had bilateral arm numbness worse in the right arm than in the left. She sometimes feels her heart skipping a beat. She can often hear her heartbeat in her right ear. Her social history reveals that she is a widow. She retired from work in 2005 so she could help look after her husband who had multiple comorbidities and finally died of high blood pressure, strokes, and end-stage renal disease and lung cancer. The patient does not smoke. She does not use alcohol. Patient does have a history of high cholesterol. Her LDL cholesterol in June 2014 was 161. She has a history of GI bleeding and was hospitalized in October or November 2015 with hematochezia attributed to diverticulosis. Social history reveals that she has a son with cerebral palsy. The patient and her son live in her old home. They're considering moving because they can no longer keep up with a large yard. At her last visit we scheduled her for a stress Myoview.  This was done on 12/24/14 and showed an ejection fraction of 55% and no ischemia.   Past Medical History  Diagnosis Date  . Depression   . Allergy   . History of  chicken pox   . History of colon polyps   . Chronic neck pain   . Chronic back pain   . Sciatica   . Thoracic compression fracture   . Lower GI bleeding   . GERD (gastroesophageal reflux disease)   . History of hiatal hernia   . Migraines     "monthly usually; weekly lately" (05/26/2014)  . Arthritis     "left foot" (05/26/2014)  . Anxiety     Past Surgical History  Procedure Laterality Date  . Polypectomy    . Colonoscopy  2005    last 2005  . Abdominal hysterectomy  ~ 1978    "partial"  . Appendectomy  ~ 1978     Current Outpatient Prescriptions  Medication Sig Dispense Refill  . acetaminophen (TYLENOL) 650 MG CR tablet Take 650 mg by mouth every 8 (eight) hours as needed for pain.    Marland Kitchen atorvastatin (LIPITOR) 80 MG tablet Take 1 tablet (80 mg total) by mouth daily. 30 tablet 6  . Calcium-Magnesium-Vitamin D (CALCIUM 1200+D3 PO) Take 2 capsules by mouth daily.    . citalopram (CELEXA) 10 MG tablet TAKE ONE TABLET BY MOUTH DAILY. 30 tablet 11  . Ergocalciferol (DRISDOL PO) Take 3,000 Units by mouth daily.    . Ferrous Sulfate (RA IRON) 27 MG TABS Take 1 tablet by mouth as needed (low iron level).     Marland Kitchen loratadine (CLARITIN) 10 MG tablet  Take 10 mg by mouth at bedtime as needed (helps with sleep and headaches).    . mirtazapine (REMERON) 7.5 MG tablet TAKE ONE TABLET BY MOUTH AT BEDTIME 30 tablet 11  . Multiple Vitamin (STRESS FORMULA 500/BIOTIN PO) Take 1 tablet by mouth daily.    Marland Kitchen omeprazole (PRILOSEC) 20 MG capsule TAKE ONE CAPSULE BY MOUTH ONCE DAILY 30 capsule 11  . pantoprazole (PROTONIX) 40 MG tablet Take 1 tablet (40 mg total) by mouth daily. 90 tablet 3  . saccharomyces boulardii (FLORASTOR) 250 MG capsule Take 1 capsule (250 mg total) by mouth 2 (two) times daily. 30 capsule 0  . traMADol (ULTRAM) 50 MG tablet Take 1 tablet (50 mg total) by mouth every 6 (six) hours as needed for moderate pain. 45 tablet 0  . vitamin B-12 (CYANOCOBALAMIN) 1000 MCG tablet Take 1,000  mcg by mouth daily.    . vitamin E 400 UNIT capsule Take 400 Units by mouth daily.     No current facility-administered medications for this visit.    Allergies:   Review of patient's allergies indicates no known allergies.    Social History:  The patient  reports that she has never smoked. She has never used smokeless tobacco. She reports that she does not drink alcohol or use illicit drugs.   Family History:  The patient's family history includes Diabetes in her brother and sister; Heart disease in her brother, father, and mother; Prostate cancer in her father; Stomach cancer (age of onset: 68) in her sister. There is no history of Rectal cancer or Colon cancer.    ROS:  Please see the history of present illness.   Otherwise, review of systems are positive for none.   All other systems are reviewed and negative.    PHYSICAL EXAM: VS:  BP 116/80 mmHg  Pulse 89  Ht 5\' 4"  (1.626 m)  Wt 146 lb 12.8 oz (66.588 kg)  BMI 25.19 kg/m2 , BMI Body mass index is 25.19 kg/(m^2). GEN: Well nourished, well developed, in no acute distress HEENT: normal Neck: no JVD, carotid bruits, or masses Cardiac: RRR; no murmurs, rubs, or gallops,no edema  Respiratory:  clear to auscultation bilaterally, normal work of breathing GI: soft, nontender, nondistended, + BS MS: no deformity or atrophy Skin: warm and dry, no rash Neuro:  Strength and sensation are intact Psych: euthymic mood, full affect   EKG:  EKG is not ordered today.    Recent Labs: 05/25/2014: TSH 2.390 12/07/2014: ALT 16; BUN 16; Creatinine, Ser 0.87; Hemoglobin 13.0; Magnesium 2.1; Platelets 293.0; Potassium 3.6; Sodium 137    Lipid Panel    Component Value Date/Time   CHOL 360* 12/07/2014 1602   TRIG * 12/07/2014 1602    824.0 Triglyceride is over 400; calculations on Lipids are invalid.   HDL 40.20 12/07/2014 1602   CHOLHDL 9 12/07/2014 1602   VLDL 35.4 12/13/2012 1036   LDLDIRECT 84.0 12/07/2014 1602      Wt Readings  from Last 3 Encounters:  03/12/15 146 lb 12.8 oz (66.588 kg)  12/24/14 145 lb (65.772 kg)  12/07/14 145 lb 6.4 oz (65.953 kg)         ASSESSMENT AND PLAN:  1. Chest pain and exertional dyspnea improved.  Recent Myoview stress test normal 2. Hyperlipidemia 3. Strong family history of coronary disease 4. History of PVCs   Current medicines are reviewed at length with the patient today.  The patient does not have concerns regarding medicines.  The following changes have  been made:  no change  Labs/ tests ordered today include:  No orders of the defined types were placed in this encounter.     Disposition: Continue close follow-up with Dr. Ronnald Ramp.  Recheck here when necessary.  Patient reassured about her normal stress test.  Signed, Charlene Coco MD 03/12/2015 7:22 PM    [facility] [address], [place], [place]  [address] Phone: [phone]; Fax: [phone]

## 2015-03-12 NOTE — Patient Instructions (Signed)
Medication Instructions:  Your physician recommends that you continue on your current medications as directed. Please refer to the Current Medication list given to you today.  Labwork: NONE  Testing/Procedures: NONE  Follow-Up: FOLLOW UP AS NEEDED

## 2015-05-11 ENCOUNTER — Other Ambulatory Visit (INDEPENDENT_AMBULATORY_CARE_PROVIDER_SITE_OTHER): Payer: Commercial Managed Care - HMO

## 2015-05-11 ENCOUNTER — Encounter: Payer: Self-pay | Admitting: Internal Medicine

## 2015-05-11 ENCOUNTER — Ambulatory Visit (INDEPENDENT_AMBULATORY_CARE_PROVIDER_SITE_OTHER): Payer: Commercial Managed Care - HMO | Admitting: Internal Medicine

## 2015-05-11 VITALS — BP 110/84 | HR 75 | Temp 98.3°F | Resp 16 | Ht 64.0 in | Wt 148.0 lb

## 2015-05-11 DIAGNOSIS — Z23 Encounter for immunization: Secondary | ICD-10-CM

## 2015-05-11 DIAGNOSIS — E781 Pure hyperglyceridemia: Secondary | ICD-10-CM | POA: Diagnosis not present

## 2015-05-11 DIAGNOSIS — E785 Hyperlipidemia, unspecified: Secondary | ICD-10-CM

## 2015-05-11 DIAGNOSIS — R413 Other amnesia: Secondary | ICD-10-CM | POA: Diagnosis not present

## 2015-05-11 DIAGNOSIS — K648 Other hemorrhoids: Secondary | ICD-10-CM | POA: Insufficient documentation

## 2015-05-11 HISTORY — DX: Pure hyperglyceridemia: E78.1

## 2015-05-11 LAB — LIPID PANEL
Cholesterol: 216 mg/dL — ABNORMAL HIGH (ref 0–200)
HDL: 36.5 mg/dL — ABNORMAL LOW (ref >39.00)
Total CHOL/HDL Ratio: 6
Triglycerides: 575 mg/dL — ABNORMAL HIGH (ref 0.0–149.0)

## 2015-05-11 LAB — COMPREHENSIVE METABOLIC PANEL
ALBUMIN: 4.3 g/dL (ref 3.5–5.2)
ALT: 11 U/L (ref 0–35)
AST: 14 U/L (ref 0–37)
Alkaline Phosphatase: 137 U/L — ABNORMAL HIGH (ref 39–117)
BILIRUBIN TOTAL: 0.5 mg/dL (ref 0.2–1.2)
BUN: 15 mg/dL (ref 6–23)
CALCIUM: 9.8 mg/dL (ref 8.4–10.5)
CO2: 27 meq/L (ref 19–32)
CREATININE: 0.9 mg/dL (ref 0.40–1.20)
Chloride: 106 mEq/L (ref 96–112)
GFR: 66.06 mL/min (ref >60.00)
Glucose, Bld: 84 mg/dL (ref 70–99)
Potassium: 3.9 mEq/L (ref 3.5–5.1)
SODIUM: 141 meq/L (ref 135–145)
Total Protein: 7.1 g/dL (ref 6.0–8.3)

## 2015-05-11 LAB — FECAL OCCULT BLOOD, GUAIAC: Fecal Occult Blood: NEGATIVE

## 2015-05-11 LAB — LDL CHOLESTEROL, DIRECT: Direct LDL: 59 mg/dL

## 2015-05-11 LAB — TSH: TSH: 1.43 u[IU]/mL (ref 0.35–4.50)

## 2015-05-11 MED ORDER — HYDROCORTISONE ACE-PRAMOXINE 1-1 % RE FOAM: 1.0000 | Freq: Two times a day (BID) | RECTAL | Status: DC

## 2015-05-11 NOTE — Progress Notes (Signed)
Subjective:  Patient ID: Charlene Patterson, female    DOB: 01-16-1946  Age: 70 y.o. MRN: [id number]  CC: Hyperlipidemia   HPI Charlene Patterson presents for follow-up on triglycerides and cholesterol but she also complains of anal irritation for the last 8-9 months. She occasionally sees blood in her stools and has anal itching.  Outpatient Prescriptions Prior to Visit  Medication Sig Dispense Refill  . acetaminophen (TYLENOL) 650 MG CR tablet Take 650 mg by mouth every 8 (eight) hours as needed for pain.    Marland Kitchen atorvastatin (LIPITOR) 80 MG tablet Take 1 tablet (80 mg total) by mouth daily. 30 tablet 6  . Calcium-Magnesium-Vitamin D (CALCIUM 1200+D3 PO) Take 2 capsules by mouth daily.    . citalopram (CELEXA) 10 MG tablet TAKE ONE TABLET BY MOUTH DAILY. 30 tablet 11  . Ergocalciferol (DRISDOL PO) Take 3,000 Units by mouth daily.    . Ferrous Sulfate (RA IRON) 27 MG TABS Take 1 tablet by mouth as needed (low iron level).     Marland Kitchen loratadine (CLARITIN) 10 MG tablet Take 10 mg by mouth at bedtime as needed (helps with sleep and headaches).    . mirtazapine (REMERON) 7.5 MG tablet TAKE ONE TABLET BY MOUTH AT BEDTIME 30 tablet 11  . Multiple Vitamin (STRESS FORMULA 500/BIOTIN PO) Take 1 tablet by mouth daily.    Marland Kitchen omeprazole (PRILOSEC) 20 MG capsule TAKE ONE CAPSULE BY MOUTH ONCE DAILY 30 capsule 11  . pantoprazole (PROTONIX) 40 MG tablet Take 1 tablet (40 mg total) by mouth daily. 90 tablet 3  . saccharomyces boulardii (FLORASTOR) 250 MG capsule Take 1 capsule (250 mg total) by mouth 2 (two) times daily. 30 capsule 0  . traMADol (ULTRAM) 50 MG tablet Take 1 tablet (50 mg total) by mouth every 6 (six) hours as needed for moderate pain. 45 tablet 0  . vitamin B-12 (CYANOCOBALAMIN) 1000 MCG tablet Take 1,000 mcg by mouth daily.    . vitamin E 400 UNIT capsule Take 400 Units by mouth daily.     No facility-administered medications prior to visit.    ROS Review of Systems  Constitutional: Negative.     HENT: Negative.   Eyes: Negative.   Respiratory: Negative.  Negative for cough, choking and shortness of breath.   Cardiovascular: Negative.  Negative for chest pain, palpitations and leg swelling.  Gastrointestinal: Positive for blood in stool and anal bleeding. Negative for nausea, abdominal pain, diarrhea and constipation.  Endocrine: Negative.   Genitourinary: Negative.   Musculoskeletal: Negative.  Negative for myalgias, back pain, arthralgias and neck pain.  Skin: Negative.  Negative for pallor and rash.  Allergic/Immunologic: Negative.   Neurological: Negative.  Negative for dizziness, weakness, light-headedness, numbness and headaches.  Hematological: Negative.  Negative for adenopathy. Does not bruise/bleed easily.  Psychiatric/Behavioral: Positive for decreased concentration. Negative for sleep disturbance. The patient is not nervous/anxious.        She complains of memory loss and forgetfulness    Objective:  BP 110/84 mmHg  Pulse 75  Temp(Src) 98.3 F (36.8 C) (Oral)  Resp 16  Ht 5\' 4"  (1.626 m)  Wt 148 lb (67.132 kg)  BMI 25.39 kg/m2  SpO2 98%  BP Readings from Last 3 Encounters:  05/11/15 110/84  03/12/15 116/80  12/07/14 126/78    Wt Readings from Last 3 Encounters:  05/11/15 148 lb (67.132 kg)  03/12/15 146 lb 12.8 oz (66.588 kg)  12/24/14 145 lb (65.772 kg)    Physical Exam  Constitutional: She is oriented to person, place, and time. No distress.  HENT:  Head: Normocephalic and atraumatic.  Mouth/Throat: Oropharynx is clear and moist. No oropharyngeal exudate.  Eyes: Conjunctivae are normal. Right eye exhibits no discharge. Left eye exhibits no discharge. No scleral icterus.  Neck: Normal range of motion. Neck supple. No JVD present. No tracheal deviation present. No thyromegaly present.  Cardiovascular: Normal rate, regular rhythm, normal heart sounds and intact distal pulses.  Exam reveals no gallop and no friction rub.   No murmur  heard. Pulmonary/Chest: Effort normal and breath sounds normal. No stridor. No respiratory distress. She has no wheezes. She has no rales. She exhibits no tenderness.  Abdominal: Soft. Bowel sounds are normal. She exhibits no distension and no mass. There is no tenderness. There is no rebound and no guarding.  Genitourinary: Rectal exam shows internal hemorrhoid. Rectal exam shows no external hemorrhoid, no fissure, no mass, no tenderness and anal tone normal. Guaiac negative stool.  She has uncomplicated internal anal hemorrhoids  Musculoskeletal: Normal range of motion. She exhibits no edema or tenderness.  Lymphadenopathy:    She has no cervical adenopathy.  Neurological: She is oriented to person, place, and time.  Skin: Skin is warm and dry. No rash noted. She is not diaphoretic. No erythema. No pallor.  Psychiatric: She has a normal mood and affect. Her behavior is normal. Judgment and thought content normal.  Vitals reviewed.   Lab Results  Component Value Date   WBC 5.6 12/07/2014   HGB 13.0 12/07/2014   HCT 38.2 12/07/2014   PLT 293.0 12/07/2014   GLUCOSE 84 05/11/2015   CHOL 216* 05/11/2015   TRIG * 05/11/2015    575.0 Triglyceride is over 400; calculations on Lipids are invalid.   HDL 36.50* 05/11/2015   LDLDIRECT 59.0 05/11/2015   ALT 11 05/11/2015   AST 14 05/11/2015   NA 141 05/11/2015   K 3.9 05/11/2015   CL 106 05/11/2015   CREATININE 0.90 05/11/2015   BUN 15 05/11/2015   CO2 27 05/11/2015   TSH 1.43 05/11/2015    Mm Digital Screening Bilateral  06/10/2014  CLINICAL DATA:  Screening. EXAM: DIGITAL SCREENING BILATERAL MAMMOGRAM WITH CAD COMPARISON:  12/18/2012 ACR Breast Density Category c: The breast tissue is heterogeneously dense, which may obscure small masses. FINDINGS: There are no findings suspicious for malignancy. Images were processed with CAD. IMPRESSION: No mammographic evidence of malignancy. A result letter of this screening mammogram will be mailed  directly to the patient. RECOMMENDATION: Screening mammogram in one year. (Code:SM-B-01Y) BI-RADS CATEGORY  2: Benign. Electronically Signed   By: Rosemarie Ax   On: 06/10/2014 09:35    Assessment & Plan:   Charlene Patterson was seen today for hyperlipidemia.  Diagnoses and all orders for this visit:  Hypertriglyceridemia- her triglycerides up to 575, will start treating with omega-3 fish oils -     Lipid panel; Future -     TSH; Future -     Comprehensive metabolic panel; Future -     omega-3 acid ethyl esters (LOVAZA) 1 G capsule; Take 2 capsules (2 g total) by mouth 2 (two) times daily.  Hyperlipidemia with target LDL less than 130- she has achieved her LDL goal -     Lipid panel; Future -     TSH; Future -     Comprehensive metabolic panel; Future  Need for influenza vaccination -     Flu Vaccine QUAD 36+ mos IM  Need for vaccination with 13-polyvalent  pneumococcal conjugate vaccine -     Pneumococcal conjugate vaccine 13-valent  Memory loss- I'm concerned she may be developing dementia, I've asked her to see neurology. -     Ambulatory referral to Neurology  Hemorrhoids, internal, with bleeding- we'll treat this with Proctofoam cream. -     hydrocortisone-pramoxine (PROCTOFOAM HC) rectal foam; Place 1 applicator rectally 2 (two) times daily.   I am having Charlene Patterson start on hydrocortisone-pramoxine and omega-3 acid ethyl esters. I am also having her maintain her Ferrous Sulfate, Multiple Vitamin (STRESS FORMULA 500/BIOTIN PO), Calcium-Magnesium-Vitamin D (CALCIUM 1200+D3 PO), acetaminophen, vitamin B-12, vitamin E, loratadine, saccharomyces boulardii, traMADol, pantoprazole, atorvastatin, omeprazole, citalopram, mirtazapine, and Ergocalciferol (DRISDOL PO).  Meds ordered this encounter  Medications  . hydrocortisone-pramoxine (PROCTOFOAM HC) rectal foam    Sig: Place 1 applicator rectally 2 (two) times daily.    Dispense:  10 g    Refill:  2  . omega-3 acid ethyl esters (LOVAZA)  1 G capsule    Sig: Take 2 capsules (2 g total) by mouth 2 (two) times daily.    Dispense:  120 capsule    Refill:  11     Follow-up: Return in about 3 months (around 08/11/2015).  Scarlette Calico, MD

## 2015-05-11 NOTE — Progress Notes (Signed)
Pre visit review using our clinic review tool, if applicable. No additional management support is needed unless otherwise documented below in the visit note. 

## 2015-05-11 NOTE — Patient Instructions (Signed)

## 2015-05-12 ENCOUNTER — Encounter: Payer: Self-pay | Admitting: Internal Medicine

## 2015-05-16 MED ORDER — OMEGA-3-ACID ETHYL ESTERS 1 G PO CAPS: 2.0000 g | ORAL_CAPSULE | Freq: Two times a day (BID) | ORAL | Status: DC

## 2015-05-21 ENCOUNTER — Telehealth: Payer: Self-pay | Admitting: Internal Medicine

## 2015-05-21 NOTE — Telephone Encounter (Signed)
Left vm for patient to call back to verify patients address.  If patient call backs get with Tammy to re mail labs.

## 2015-06-18 ENCOUNTER — Ambulatory Visit (INDEPENDENT_AMBULATORY_CARE_PROVIDER_SITE_OTHER): Payer: Commercial Managed Care - HMO | Admitting: Neurology

## 2015-06-18 ENCOUNTER — Other Ambulatory Visit (INDEPENDENT_AMBULATORY_CARE_PROVIDER_SITE_OTHER): Payer: Commercial Managed Care - HMO

## 2015-06-18 ENCOUNTER — Encounter: Payer: Self-pay | Admitting: Neurology

## 2015-06-18 VITALS — BP 110/72 | HR 95 | Ht 64.0 in | Wt 149.0 lb

## 2015-06-18 DIAGNOSIS — E781 Pure hyperglyceridemia: Secondary | ICD-10-CM | POA: Diagnosis not present

## 2015-06-18 DIAGNOSIS — F329 Major depressive disorder, single episode, unspecified: Secondary | ICD-10-CM

## 2015-06-18 DIAGNOSIS — F32A Depression, unspecified: Secondary | ICD-10-CM

## 2015-06-18 DIAGNOSIS — R413 Other amnesia: Secondary | ICD-10-CM

## 2015-06-18 LAB — VITAMIN B12: Vitamin B-12: 367 pg/mL (ref 211–911)

## 2015-06-18 NOTE — Progress Notes (Signed)
NEUROLOGY CONSULTATION NOTE  Charlene Patterson MRN: [id number] DOB: 05/03/1946  Referring provider: Dr. Scarlette Calico Primary care provider: Dr. Scarlette Calico  Reason for consult:  Memory loss  Dear Dr Ronnald Ramp:  Thank you for your kind referral of Charlene Patterson for consultation of the above symptoms. Although her history is well known to you, please allow me to reiterate it for the purpose of our medical record. Records and images were personally reviewed where available.  HISTORY OF PRESENT ILLNESS: This is a 69 year old right-handed woman with a history of hyperlipidemia, chronic back pain, presenting for worsening memory. She reports that her memory is "not too good," and started noticing changes gradually over the past few years. She reports problems mostly with short-term memory, sometimes she forgets to take her medications and her son would remind her. She misplaces things frequently and has been told that she repeats herself. She left the stove on a few times. She has to write everything down. She denies getting lost driving, and reports she can find her way back if she makes a wrong turn. She lives with her son with cerebral palsy, and denies any missed bill payments. They are planning to move in with her grandson's family soon.   She has a history of migraines since childhood, worse with weather changes and loud sounds and smells. She has occasional dizziness and gets motion sick quickly. She denies any diplopia, blurred vision, dysarthria, dysphagia. She has right hand and arm numbness and tingling when driving. She has chronic neck and back pain, at times triggering her headaches. She has problems with her rectum, but denies any constipation. No anosmia or tremors. She became tearful reporting that her husband passed away in 23-Oct-2010, and reports mood is "up and down." She denies any history of head injuries or alcohol use. Her younger sister has memory problems and got lost driving at age  47.  Laboratory Data: Lab Results  Component Value Date   WBC 5.6 12/07/2014   HGB 13.0 12/07/2014   HCT 38.2 12/07/2014   MCV 86.1 12/07/2014   PLT 293.0 12/07/2014     Chemistry      Component Value Date/Time   NA 141 05/11/2015 1625   K 3.9 05/11/2015 1625   CL 106 05/11/2015 1625   CO2 27 05/11/2015 1625   BUN 15 05/11/2015 1625   CREATININE 0.90 05/11/2015 1625      Component Value Date/Time   CALCIUM 9.8 05/11/2015 1625   ALKPHOS 137* 05/11/2015 1625   AST 14 05/11/2015 1625   ALT 11 05/11/2015 1625   BILITOT 0.5 05/11/2015 1625     Lab Results  Component Value Date   TSH 1.43 05/11/2015    PAST MEDICAL HISTORY: Past Medical History  Diagnosis Date  . Depression   . Allergy   . History of chicken pox   . History of colon polyps   . Chronic neck pain   . Chronic back pain   . Sciatica   . Thoracic compression fracture ([place])   . Lower GI bleeding   . GERD (gastroesophageal reflux disease)   . History of hiatal hernia   . Migraines     "monthly usually; weekly lately" (05/26/2014)  . Arthritis     "left foot" (05/26/2014)  . Anxiety     PAST SURGICAL HISTORY: Past Surgical History  Procedure Laterality Date  . Polypectomy    . Colonoscopy  10-23-2003    last 10-23-03  . Abdominal  hysterectomy  ~ 1978    "partial"  . Appendectomy  ~ 1978    MEDICATIONS: Current Outpatient Prescriptions on File Prior to Visit  Medication Sig Dispense Refill  . acetaminophen (TYLENOL) 650 MG CR tablet Take 650 mg by mouth every 8 (eight) hours as needed for pain.    Marland Kitchen atorvastatin (LIPITOR) 80 MG tablet Take 1 tablet (80 mg total) by mouth daily. 30 tablet 6  . Calcium-Magnesium-Vitamin D (CALCIUM 1200+D3 PO) Take 2 capsules by mouth daily.    . citalopram (CELEXA) 10 MG tablet TAKE ONE TABLET BY MOUTH DAILY. 30 tablet 11  . Ergocalciferol (DRISDOL PO) Take 3,000 Units by mouth daily.    . Ferrous Sulfate (RA IRON) 27 MG TABS Take 1 tablet by mouth as needed (low iron  level).     . hydrocortisone-pramoxine (PROCTOFOAM HC) rectal foam Place 1 applicator rectally 2 (two) times daily. 10 g 2  . mirtazapine (REMERON) 7.5 MG tablet TAKE ONE TABLET BY MOUTH AT BEDTIME 30 tablet 11  . Multiple Vitamin (STRESS FORMULA 500/BIOTIN PO) Take 1 tablet by mouth daily.    Marland Kitchen omeprazole (PRILOSEC) 20 MG capsule TAKE ONE CAPSULE BY MOUTH ONCE DAILY 30 capsule 11  . pantoprazole (PROTONIX) 40 MG tablet Take 1 tablet (40 mg total) by mouth daily. 90 tablet 3  . vitamin B-12 (CYANOCOBALAMIN) 1000 MCG tablet Take 1,000 mcg by mouth daily.    . vitamin E 400 UNIT capsule Take 400 Units by mouth daily.    Marland Kitchen loratadine (CLARITIN) 10 MG tablet Take 10 mg by mouth at bedtime as needed (helps with sleep and headaches).     No current facility-administered medications on file prior to visit.    ALLERGIES: No Known Allergies  FAMILY HISTORY: Family History  Problem Relation Age of Onset  . Heart disease Mother   . Heart disease Father   . Prostate cancer Father   . Diabetes Sister   . Stomach cancer Sister 36  . Diabetes Brother   . Heart disease Brother   . Rectal cancer Neg Hx   . Colon cancer Neg Hx     SOCIAL HISTORY: Social History   Social History  . Marital Status: Widowed    Spouse Name: N/A  . Number of Children: 2  . Years of Education: 12   Occupational History  . Retired    Social History Main Topics  . Smoking status: Never Smoker   . Smokeless tobacco: Never Used  . Alcohol Use: No  . Drug Use: No  . Sexual Activity: No   Other Topics Concern  . Not on file   Social History Narrative   Regular exercise-no   Caffeine Use-yes    REVIEW OF SYSTEMS: Constitutional: No fevers, chills, or sweats, no generalized fatigue, change in appetite Eyes: No visual changes, double vision, eye pain Ear, nose and throat: No hearing loss, ear pain, nasal congestion, sore throat Cardiovascular: No chest pain, palpitations Respiratory:  No shortness of  breath at rest or with exertion, wheezes GastrointestinaI: No nausea, vomiting, diarrhea, abdominal pain, fecal incontinence Genitourinary:  No dysuria, urinary retention or frequency Musculoskeletal:  + neck pain, back pain Integumentary: No rash, pruritus, skin lesions Neurological: as above Psychiatric: No depression, insomnia, anxiety Endocrine: No palpitations, fatigue, diaphoresis, mood swings, change in appetite, change in weight, increased thirst Hematologic/Lymphatic:  No anemia, purpura, petechiae. Allergic/Immunologic: no itchy/runny eyes, nasal congestion, recent allergic reactions, rashes  PHYSICAL EXAM: Filed Vitals:   06/18/15 1421  BP:  110/72  Pulse: 95   General: No acute distress Head:  Normocephalic/atraumatic Eyes: Fundoscopic exam shows bilateral sharp discs, no vessel changes, exudates, or hemorrhages Neck: supple, no paraspinal tenderness, full range of motion Back: No paraspinal tenderness Heart: regular rate and rhythm Lungs: Clear to auscultation bilaterally. Vascular: No carotid bruits. Skin/Extremities: No rash, no edema Neurological Exam: Mental status: alert and oriented to person, place, and time, no dysarthria or aphasia, Fund of knowledge is appropriate.  Recent and remote memory are intact.  Attention and concentration are normal.    Able to name objects and repeat phrases. CDT 5/5 MMSE - Mini Mental State Exam 06/18/2015  Orientation to time 5  Orientation to Place 5  Registration 3  Attention/ Calculation 5  Recall 3  Language- name 2 objects 2  Language- repeat 1  Language- follow 3 step command 3  Language- read & follow direction 1  Write a sentence 1  Copy design 1  Total score 30   Cranial nerves: CN I: not tested CN II: pupils equal, round and reactive to light, visual fields intact, fundi unremarkable. CN III, IV, VI:  full range of motion, no nystagmus, no ptosis CN V: facial sensation intact CN VII: upper and lower face  symmetric CN VIII: hearing intact to finger rub CN IX, X: gag intact, uvula midline CN XI: sternocleidomastoid and trapezius muscles intact CN XII: tongue midline Bulk & Tone: normal, no fasciculations. Motor: 5/5 throughout with no pronator drift. Sensation: intact to light touch, cold, pin, vibration and joint position sense.  No extinction to double simultaneous stimulation.  Romberg test negative Deep Tendon Reflexes: +2 throughout, no ankle clonus Plantar responses: downgoing bilaterally Cerebellar: no incoordination on finger to nose, heel to shin. No dysdiadochokinesia Gait: narrow-based and steady, able to tandem walk adequately. Tremor: none  IMPRESSION: This is a 69 year old right-handed woman with hyperlipidemia, presenting for worsening memory loss over the past few years. Neurological exam normal, MMSE normal 30/30. We discussed different causes of memory loss. TSH normal, check B12. MRI brain without contrast will be ordered to assess for underlying structural abnormality and assess vascular load. We discussed effects of mood on memory (pseudodementia), she feels that her anti-depressant is not helping much and will discuss this with Dr. Ronnald Ramp. We discussed using various strategies to help with memory, particularly using a pillbox to help her remember her medications. No clear indication to start cholinesterase inhibitors such as Aricept at this time. We discussed the importance of control of vascular risk factors, physical exercise, and brain stimulation exercises for brain health. She will follow-up in 6 months.   Thank you for allowing me to participate in the care of this patient. Please do not hesitate to call for any questions or concerns.   Ellouise Newer, M.D.  CC: Dr. Ronnald Ramp

## 2015-06-18 NOTE — Patient Instructions (Signed)
1. Schedule MRI brain without contrast 2. Bloodwork for B12 level 3. Discuss depression with Dr. Ronnald Ramp 4. Start using pillbox to help remember to take medications 5. Follow-up in 6 months

## 2015-06-21 ENCOUNTER — Telehealth: Payer: Self-pay | Admitting: Family Medicine

## 2015-06-21 NOTE — Telephone Encounter (Signed)
Patient was notified of result. 

## 2015-06-21 NOTE — Telephone Encounter (Signed)
-----   Message from Cameron Sprang, MD sent at 06/21/2015  8:38 AM EST ----- Pls let her know B12 level is normal, thanks

## 2015-06-28 DIAGNOSIS — F329 Major depressive disorder, single episode, unspecified: Secondary | ICD-10-CM | POA: Insufficient documentation

## 2015-06-28 DIAGNOSIS — F32A Depression, unspecified: Secondary | ICD-10-CM | POA: Insufficient documentation

## 2015-07-01 ENCOUNTER — Ambulatory Visit (HOSPITAL_COMMUNITY)
Admission: RE | Admit: 2015-07-01 | Discharge: 2015-07-01 | Disposition: A | Discharge status: Home / Self Care | Payer: Commercial Managed Care - HMO | Source: Ambulatory Visit | Attending: Neurology | Admitting: Neurology

## 2015-07-01 DIAGNOSIS — R413 Other amnesia: Secondary | ICD-10-CM | POA: Diagnosis not present

## 2015-07-01 DIAGNOSIS — G43909 Migraine, unspecified, not intractable, without status migrainosus: Secondary | ICD-10-CM | POA: Diagnosis not present

## 2015-07-02 ENCOUNTER — Telehealth: Payer: Self-pay | Admitting: Family Medicine

## 2015-07-02 NOTE — Telephone Encounter (Signed)
Lmovm to rtn my call. 

## 2015-07-02 NOTE — Telephone Encounter (Signed)
-----   Message from Cameron Sprang, MD sent at 07/02/2015  9:00 AM EST ----- Pls let patient know I reviewed MRI brain, it is normal for age, no evidence of tumor, stroke, or bleed. Thanks

## 2015-07-06 ENCOUNTER — Other Ambulatory Visit: Payer: Self-pay

## 2015-07-06 ENCOUNTER — Telehealth: Payer: Self-pay | Admitting: Internal Medicine

## 2015-07-06 DIAGNOSIS — Z1231 Encounter for screening mammogram for malignant neoplasm of breast: Secondary | ICD-10-CM

## 2015-07-06 DIAGNOSIS — K648 Other hemorrhoids: Secondary | ICD-10-CM

## 2015-07-06 NOTE — Telephone Encounter (Signed)
Pt called state she was here back in November 2016 but she never got the result of the blood work and the information about her colon problem. Please check, pt state we were going to send out information to her address but she never get it. Please call her back  Phone # [phone]

## 2015-07-06 NOTE — Telephone Encounter (Signed)
Pt returning your call [phone]

## 2015-07-06 NOTE — Telephone Encounter (Signed)
Spoke with patient and notified her of result.

## 2015-07-07 MED ORDER — NYSTATIN-TRIAMCINOLONE 100000-0.1 UNIT/GM-% EX OINT: TOPICAL_OINTMENT | CUTANEOUS | Status: DC

## 2015-07-07 NOTE — Telephone Encounter (Signed)
Pt states her hemorrhoids have gotten worse since last OV. She states she is having to use wipes every time she has a bowel movement and causing discomfort. The foam that was prescribed is not working. She is currently using a nystatin/triamcinolone cream that is alleviating some of the symptoms and is requesting a refill.

## 2015-07-07 NOTE — Telephone Encounter (Signed)
Please follow back up with patient.  °

## 2015-07-07 NOTE — Telephone Encounter (Signed)
LMOVM

## 2015-07-07 NOTE — Addendum Note (Signed)
Addended by: Levonne Lapping on: 07/07/2015 03:55 PM   Modules accepted: Orders

## 2015-07-09 ENCOUNTER — Ambulatory Visit
Admission: RE | Admit: 2015-07-09 | Discharge: 2015-07-09 | Disposition: A | Discharge status: Home / Self Care | Payer: Medicare HMO | Source: Ambulatory Visit

## 2015-07-09 DIAGNOSIS — Z1231 Encounter for screening mammogram for malignant neoplasm of breast: Secondary | ICD-10-CM | POA: Diagnosis not present

## 2015-07-10 NOTE — Telephone Encounter (Signed)
Please ask her where she got the prescription filled for the foam that she is currently using that she got from her friend to treat the area. I need to know specifically what it is so that I can call it in to her pharmacy.

## 2015-07-12 LAB — HM MAMMOGRAPHY

## 2015-07-12 NOTE — Telephone Encounter (Signed)
Pt need referral for hemorrhoids

## 2015-07-12 NOTE — Addendum Note (Signed)
Addended by: Janith Lima on: 07/12/2015 05:06 PM   Modules accepted: Orders

## 2015-07-12 NOTE — Addendum Note (Signed)
Addended by: Janith Lima on: 07/12/2015 02:04 PM   Modules accepted: Miquel Dunn

## 2015-07-29 ENCOUNTER — Ambulatory Visit (INDEPENDENT_AMBULATORY_CARE_PROVIDER_SITE_OTHER): Payer: Commercial Managed Care - HMO | Admitting: Gastroenterology

## 2015-07-29 ENCOUNTER — Encounter: Payer: Self-pay | Admitting: Gastroenterology

## 2015-07-29 VITALS — BP 110/64 | HR 56 | Ht 63.25 in | Wt 150.4 lb

## 2015-07-29 DIAGNOSIS — K625 Hemorrhage of anus and rectum: Secondary | ICD-10-CM | POA: Diagnosis not present

## 2015-07-29 DIAGNOSIS — K649 Unspecified hemorrhoids: Secondary | ICD-10-CM

## 2015-07-29 NOTE — Patient Instructions (Signed)
We will contact you about when your hemorrhoidal banding will start Use Benefiber 1-2 tablespoons in juice or water daily Use Miralax 1 capful daily

## 2015-07-29 NOTE — Progress Notes (Signed)
Charlene BICKELL    [id number]    [birth year]  Primary Care Physician:Thomas Ronnald Ramp, MD  Referring Physician: Janith Lima, MD [address], [place] [address]  Chief complaint:   Bright red blood per rectum HPI:  70 year old white female here with complaints of constipation and bright red blood per rectum for the past  10-11 months since her colonoscopy.  She is constipated and is afraid to have a bowel movement because of the bleeding per rectum.  Every time she has a bowel movement she notices some blood and she wipes with the toilet paper and also sometimes has few drops of blood in the toilet bowel.  She also has intense itching and burning sensation in the rectum.  she uses tucks wipes and sometimes inserts them to control itching and also using Proctofoam as needed.  Her last colonoscopy in February 2016 showed mild diverticulosis otherwise was normal.  Prior to that she had a Colonoscopy in 2014  Which was pertinent only for a small hyperplastic polyp. In November, 2014 she was hospitalized for hematochezia. This was preceded by diarrhea. CT scan, which I reviewed, demonstrated evidence for colitis in the transverse and proximal descending colon. She was placed on antibiotics. Stool for C. difficile toxin was negative   Outpatient Encounter Prescriptions as of 07/29/2015  Medication Sig  . acetaminophen (TYLENOL) 650 MG CR tablet Take 650 mg by mouth every 8 (eight) hours as needed for pain.  Marland Kitchen atorvastatin (LIPITOR) 80 MG tablet Take 1 tablet (80 mg total) by mouth daily.  . Calcium-Magnesium-Vitamin D (CALCIUM 1200+D3 PO) Take 2 capsules by mouth daily.  . citalopram (CELEXA) 10 MG tablet TAKE ONE TABLET BY MOUTH DAILY.  . Ergocalciferol (DRISDOL PO) Take 3,000 Units by mouth daily.  . Ferrous Sulfate (RA IRON) 27 MG TABS Take 1 tablet by mouth as needed (low iron level).   . hydrocortisone-pramoxine (PROCTOFOAM HC) rectal foam Place 1 applicator  rectally 2 (two) times daily.  Marland Kitchen loratadine (CLARITIN) 10 MG tablet Take 10 mg by mouth at bedtime as needed (helps with sleep and headaches).  . mirtazapine (REMERON) 7.5 MG tablet TAKE ONE TABLET BY MOUTH AT BEDTIME  . Multiple Vitamin (STRESS FORMULA 500/BIOTIN PO) Take 1 tablet by mouth daily.  Marland Kitchen nystatin-triamcinolone ointment (MYCOLOG) Apply to affected area twice daily.  Marland Kitchen omeprazole (PRILOSEC) 20 MG capsule TAKE ONE CAPSULE BY MOUTH ONCE DAILY  . pantoprazole (PROTONIX) 40 MG tablet Take 1 tablet (40 mg total) by mouth daily.  . vitamin B-12 (CYANOCOBALAMIN) 1000 MCG tablet Take 1,000 mcg by mouth daily.  . vitamin E 400 UNIT capsule Take 400 Units by mouth daily.  [name] (TUCKS) 50 % PADS Apply topically as needed.   No facility-administered encounter medications on file as of 07/29/2015.    Allergies as of 07/29/2015  . (No Known Allergies)    Past Medical History  Diagnosis Date  . Depression   . Allergy   . History of chicken pox   . History of colon polyps   . Chronic neck pain   . Chronic back pain   . Sciatica   . Thoracic compression fracture ([place])   . Lower GI bleeding   . GERD (gastroesophageal reflux disease)   . History of hiatal hernia   . Migraines     "monthly usually; weekly lately" (05/26/2014)  . Arthritis     "left foot" (05/26/2014)  .  Anxiety     Past Surgical History  Procedure Laterality Date  . Polypectomy    . Colonoscopy  2005    last 2005  . Abdominal hysterectomy  ~ 1978    "partial"  . Appendectomy  ~ 1978    Family History  Problem Relation Age of Onset  . Heart disease Mother   . Heart disease Father   . Prostate cancer Father   . Diabetes Sister   . Stomach cancer Sister 66  . Diabetes Brother   . Heart disease Brother   . Rectal cancer Neg Hx   . Colon cancer Neg Hx     Social History   Social History  . Marital Status: Widowed    Spouse Name: N/A  . Number of Children: 2  . Years of Education: 12    Occupational History  . Retired    Social History Main Topics  . Smoking status: Never Smoker   . Smokeless tobacco: Never Used  . Alcohol Use: No  . Drug Use: No  . Sexual Activity: No   Other Topics Concern  . Not on file   Social History Narrative   Regular exercise-no   Caffeine Use-yes      Review of systems: Review of Systems  Constitutional: Negative for fever and chills.  HENT: Negative.   Eyes: Negative for blurred vision.  Respiratory: Negative for cough, shortness of breath and wheezing.   Cardiovascular: Negative for chest pain and palpitations.  Gastrointestinal: as per HPI Genitourinary: Negative for dysuria, urgency, frequency and hematuria.  Musculoskeletal: Negative for myalgias, back pain and joint pain.  Skin: Negative for itching and rash.  Neurological: Negative for dizziness, tremors, focal weakness, seizures and loss of consciousness.  Endo/Heme/Allergies: Negative for environmental allergies.  Psychiatric/Behavioral: Negative for depression, suicidal ideas and hallucinations.  All other systems reviewed and are negative.   Physical Exam: Filed Vitals:   07/29/15 1423  BP: 110/64  Pulse: 56   Gen:      No acute distress HEENT:  EOMI, sclera anicteric Neck:     No masses; no thyromegaly Lungs:    Clear to auscultation bilaterally; normal respiratory effort CV:         Regular rate and rhythm; no murmurs Abd:      + bowel sounds; soft, non-tender; no palpable masses, no distension Ext:    No edema; adequate peripheral perfusion Skin:      Warm and dry; no rash Neuro: alert and oriented x 3 Psych: normal mood and affect Rectal exam:  Normal squeeze with appropriate relaxation of anal sphincter on bear down,  no blood on glove finger.   On anoscopy 1 column of internal hemorrhoids at [facility], no active bleeding  Data Reviewed:  colonoscopy February 2016 1. Mild diverticulosis was noted in the ascending colon and sigmoid colon 2.  The examination was otherwise normal  no evidence for residual colitis  Assessment and Plan/Recommendations:  70 year old female with history of chronic GERD and chronic constipation here with complaints of bright red blood per rectum , low volume every time she has a bowel movement  Likely etiology hemorrhoidal bleeding  Advise patient to continue to use Proctofoam as needed  Will start Benefiber and MiraLAX one capful daily,   Advised patient to titrate up or down based on symptoms to have 1 soft bowel movement daily Increase fluid intake Will arrange for patient to have hemorrhoidal banding  K. Denzil Magnuson , MD [phone] Mon-Fri 8a-5p [phone] after 5p,  weekends, holidays

## 2015-08-20 ENCOUNTER — Encounter: Payer: Self-pay | Admitting: Neurology

## 2015-09-02 ENCOUNTER — Other Ambulatory Visit: Payer: Self-pay | Admitting: Internal Medicine

## 2015-10-05 ENCOUNTER — Ambulatory Visit (INDEPENDENT_AMBULATORY_CARE_PROVIDER_SITE_OTHER): Payer: Self-pay | Admitting: Gastroenterology

## 2015-10-05 ENCOUNTER — Encounter: Payer: Self-pay | Admitting: Gastroenterology

## 2015-10-05 VITALS — BP 110/72 | HR 68 | Ht 63.25 in | Wt 148.2 lb

## 2015-10-05 DIAGNOSIS — K641 Second degree hemorrhoids: Secondary | ICD-10-CM

## 2015-10-05 NOTE — Patient Instructions (Signed)
HEMORRHOID BANDING PROCEDURE    FOLLOW-UP CARE   1. The procedure you have had should have been relatively painless since the banding of the area involved does not have nerve endings and there is no pain sensation.  The rubber band cuts off the blood supply to the hemorrhoid and the band may fall off as soon as 48 hours after the banding (the band may occasionally be seen in the toilet bowl following a bowel movement). You may notice a temporary feeling of fullness in the rectum which should respond adequately to plain Tylenol or Motrin.  2. Following the banding, avoid strenuous exercise that evening and resume full activity the next day.  A sitz bath (soaking in a warm tub) or bidet is soothing, and can be useful for cleansing the area after bowel movements.     3. To avoid constipation, take two tablespoons of natural wheat bran, natural oat bran, flax, Benefiber or any over the counter fiber supplement and increase your water intake to 7-8 glasses daily.    4. Unless you have been prescribed anorectal medication, do not put anything inside your rectum for two weeks: No suppositories, enemas, fingers, etc.  5. Occasionally, you may have more bleeding than usual after the banding procedure.  This is often from the untreated hemorrhoids rather than the treated one.  Don't be concerned if there is a tablespoon or so of blood.  If there is more blood than this, lie flat with your bottom higher than your head and apply an ice pack to the area. If the bleeding does not stop within a half an hour or if you feel faint, call our office at [phone] or go to the emergency room.  6. Problems are not common; however, if there is a substantial amount of bleeding, severe pain, chills, fever or difficulty passing urine (very rare) or other problems, you should call us at [phone] or report to the nearest emergency room.  7. Do not stay seated continuously for more than 2-3 hours for a day or two  after the procedure.  Tighten your buttock muscles 10-15 times every two hours and take 10-15 deep breaths every 1-2 hours.  Do not spend more than a few minutes on the toilet if you cannot empty your bowel; instead re-visit the toilet at a later time.   Your 2nd banding appointment is scheduled on 11/11/2015 at 4pm

## 2015-10-05 NOTE — Progress Notes (Signed)
PROCEDURE NOTE: The patient presents with symptomatic grade II  hemorrhoids, requesting rubber band ligation of his/her hemorrhoidal disease.  All risks, benefits and alternative forms of therapy were described and informed consent was obtained.  In the Left Lateral Decubitus position anoscopic examination revealed grade II hemorrhoids in the Right anterior, right posterior and left lateral position(s).  The anorectum was pre-medicated with 0.125%NTG The decision was made to band the R anterior internal hemorrhoid, and the Lewis was used to perform band ligation without complication.  Digital anorectal examination was then performed to assure proper positioning of the band, and to adjust the banded tissue as required.  The patient was discharged home without pain or other issues.  Dietary and behavioral recommendations were given and along with follow-up instructions.     The following adjunctive treatments were recommended: Benefiber  The patient will return in 4 weeks for  follow-up and possible additional banding as required. No complications were encountered and the patient tolerated the procedure well.

## 2015-10-06 ENCOUNTER — Encounter: Payer: Self-pay | Admitting: Gastroenterology

## 2015-11-11 ENCOUNTER — Ambulatory Visit (INDEPENDENT_AMBULATORY_CARE_PROVIDER_SITE_OTHER): Payer: Commercial Managed Care - HMO | Admitting: Gastroenterology

## 2015-11-11 ENCOUNTER — Encounter: Payer: Commercial Managed Care - HMO | Admitting: Gastroenterology

## 2015-11-11 ENCOUNTER — Ambulatory Visit: Payer: Commercial Managed Care - HMO | Admitting: Gastroenterology

## 2015-11-11 ENCOUNTER — Encounter: Payer: Self-pay | Admitting: Gastroenterology

## 2015-11-11 VITALS — BP 118/76 | HR 88 | Ht 63.25 in | Wt 146.4 lb

## 2015-11-11 DIAGNOSIS — K641 Second degree hemorrhoids: Secondary | ICD-10-CM

## 2015-11-11 NOTE — Patient Instructions (Signed)

## 2015-11-11 NOTE — Progress Notes (Signed)
PROCEDURE NOTE: The patient presents with symptomatic grade II  hemorrhoids, requesting rubber band ligation of his/her hemorrhoidal disease.  All risks, benefits and alternative forms of therapy were described and informed consent was obtained.   The anorectum was pre-medicated with 0.125% nitroglycerine The decision was made to band the Right posterior internal hemorrhoid, and the [facility]an System was used to perform band ligation without complication.  Digital anorectal examination was then performed to assure proper positioning of the band, and to adjust the banded tissue as required.  The patient was discharged home without pain or other issues.  Dietary and behavioral recommendations were given and along with follow-up instructions.       The patient will return in 2-4 weeks for  follow-up and possible additional banding as required. No complications were encountered and the patient tolerated the procedure well.  K. Veena Holten Spano , MD [phone] Mon-Fri 8a-5p [phone] after 5p, weekends, holidays  

## 2015-12-15 ENCOUNTER — Encounter: Payer: Commercial Managed Care - HMO | Admitting: Gastroenterology

## 2015-12-20 ENCOUNTER — Ambulatory Visit: Payer: Commercial Managed Care - HMO | Admitting: Neurology

## 2016-01-13 ENCOUNTER — Other Ambulatory Visit: Payer: Self-pay | Admitting: Internal Medicine

## 2016-02-04 ENCOUNTER — Encounter: Payer: Self-pay | Admitting: Gastroenterology

## 2016-02-04 ENCOUNTER — Ambulatory Visit (INDEPENDENT_AMBULATORY_CARE_PROVIDER_SITE_OTHER): Payer: Commercial Managed Care - HMO | Admitting: Gastroenterology

## 2016-02-04 ENCOUNTER — Encounter (INDEPENDENT_AMBULATORY_CARE_PROVIDER_SITE_OTHER): Payer: Self-pay

## 2016-02-04 VITALS — BP 106/64 | HR 80 | Ht 63.25 in | Wt 135.2 lb

## 2016-02-04 DIAGNOSIS — K641 Second degree hemorrhoids: Secondary | ICD-10-CM | POA: Diagnosis not present

## 2016-02-04 DIAGNOSIS — K625 Hemorrhage of anus and rectum: Secondary | ICD-10-CM | POA: Diagnosis not present

## 2016-02-04 NOTE — Patient Instructions (Signed)

## 2016-02-06 ENCOUNTER — Encounter: Payer: Self-pay | Admitting: Gastroenterology

## 2016-02-06 NOTE — Progress Notes (Signed)
PROCEDURE NOTE: The patient presents with symptomatic grade II  hemorrhoids, requesting rubber band ligation of his/her hemorrhoidal disease.  All risks, benefits and alternative forms of therapy were described and informed consent was obtained.   The anorectum was pre-medicated with 0.125% Nitroglycerine The decision was made to band the Left lateral internal hemorrhoid, and the [place] was used to perform band ligation without complication.  Digital anorectal examination was then performed to assure proper positioning of the band, and to adjust the banded tissue as required.  The patient was discharged home without pain or other issues.  Dietary and behavioral recommendations were given and along with follow-up instructions.     The patient will return for  follow-up and possible additional banding as required. No complications were encountered and the patient tolerated the procedure well.  Damaris Hippo , MD [phone] Mon-Fri 8a-5p [phone] after 5p, weekends, holidays

## 2016-02-07 ENCOUNTER — Encounter: Payer: Self-pay | Admitting: Internal Medicine

## 2016-02-07 ENCOUNTER — Ambulatory Visit (INDEPENDENT_AMBULATORY_CARE_PROVIDER_SITE_OTHER): Payer: Commercial Managed Care - HMO | Admitting: Internal Medicine

## 2016-02-07 VITALS — BP 128/78 | HR 65 | Temp 98.0°F | Ht 63.25 in | Wt 137.8 lb

## 2016-02-07 DIAGNOSIS — Z0000 Encounter for general adult medical examination without abnormal findings: Secondary | ICD-10-CM

## 2016-02-07 DIAGNOSIS — E785 Hyperlipidemia, unspecified: Secondary | ICD-10-CM

## 2016-02-07 DIAGNOSIS — K219 Gastro-esophageal reflux disease without esophagitis: Secondary | ICD-10-CM

## 2016-02-07 DIAGNOSIS — E781 Pure hyperglyceridemia: Secondary | ICD-10-CM

## 2016-02-07 NOTE — Progress Notes (Signed)
Subjective:  Patient ID: Charlene Patterson, female    DOB: 1946-04-09  Age: 70 y.o. MRN: [id number]  CC: Annual Exam and Hyperlipidemia   HPI Charlene Patterson presents for a CPX.  She decided to stop taking a statin several months ago. She tells me it was not causing any side effects but she just thought she could get the same result with a garlic tablet.  She also complains of persistent episodes of low back pain and neck pain that is well-controlled with Tylenol. The pain does not radiate into her extremities and she denies paresthesias.  Outpatient Medications Prior to Visit  Medication Sig Dispense Refill  . acetaminophen (TYLENOL) 650 MG CR tablet Take 650 mg by mouth every 8 (eight) hours as needed for pain.    . Calcium-Magnesium-Vitamin D (CALCIUM 1200+D3 PO) Take 2 capsules by mouth daily.    . citalopram (CELEXA) 10 MG tablet TAKE ONE TABLET BY MOUTH DAILY. 30 tablet 11  . Ergocalciferol (DRISDOL PO) Take 3,000 Units by mouth daily.    . Ferrous Sulfate (RA IRON) 27 MG TABS Take 1 tablet by mouth as needed (low iron level).     . hydrocortisone-pramoxine (PROCTOFOAM HC) rectal foam Place 1 applicator rectally 2 (two) times daily. 10 g 2  . loratadine (CLARITIN) 10 MG tablet Take 10 mg by mouth at bedtime as needed (helps with sleep and headaches).    . mirtazapine (REMERON) 7.5 MG tablet TAKE ONE TABLET BY MOUTH AT BEDTIME 30 tablet 11  . pantoprazole (PROTONIX) 40 MG tablet TAKE ONE TABLET BY MOUTH DAILY 90 tablet 2  . vitamin B-12 (CYANOCOBALAMIN) 1000 MCG tablet Take 1,000 mcg by mouth daily.    . vitamin E 400 UNIT capsule Take 400 Units by mouth daily.    . Multiple Vitamin (STRESS FORMULA 500/BIOTIN PO) Take 1 tablet by mouth daily.    Marland Kitchen nystatin-triamcinolone ointment (MYCOLOG) Apply to affected area twice daily. 60 g 1  . omeprazole (PRILOSEC) 20 MG capsule TAKE ONE CAPSULE BY MOUTH ONCE DAILY 30 capsule 11  . Witch Hazel (TUCKS) 50 % PADS Apply topically as needed.    Marland Kitchen  atorvastatin (LIPITOR) 80 MG tablet Take 1 tablet (80 mg total) by mouth daily. (Patient not taking: Reported on 02/07/2016) 30 tablet 6   No facility-administered medications prior to visit.     ROS Review of Systems  Constitutional: Negative.  Negative for activity change, appetite change, chills, fatigue and fever.  HENT: Negative.   Eyes: Negative.   Respiratory: Negative.  Negative for cough, choking, chest tightness, shortness of breath and stridor.   Cardiovascular: Negative.  Negative for chest pain, palpitations and leg swelling.  Gastrointestinal: Negative.  Negative for abdominal pain, blood in stool, constipation, diarrhea, nausea and vomiting.  Endocrine: Negative.   Genitourinary: Negative.   Musculoskeletal: Positive for back pain and neck pain. Negative for gait problem and myalgias.  Skin: Negative.  Negative for color change and rash.  Allergic/Immunologic: Negative.   Neurological: Negative.  Negative for dizziness, tremors, weakness, numbness and headaches.  Hematological: Negative.  Negative for adenopathy. Does not bruise/bleed easily.  Psychiatric/Behavioral: Negative.     Objective:  BP 128/78 (BP Location: Left Arm, Patient Position: Sitting, Cuff Size: Normal)   Pulse 65   Temp 98 F (36.7 C) (Oral)   Ht 5' 3.25" (1.607 m)   Wt 137 lb 12 oz (62.5 kg)   SpO2 97%   BMI 24.21 kg/m   BP Readings from Last  3 Encounters:  02/07/16 128/78  02/04/16 106/64  11/11/15 118/76    Wt Readings from Last 3 Encounters:  02/07/16 137 lb 12 oz (62.5 kg)  02/04/16 135 lb 4 oz (61.3 kg)  11/11/15 146 lb 6 oz (66.4 kg)    Physical Exam  Constitutional: She is oriented to person, place, and time. No distress.  HENT:  Head: Normocephalic and atraumatic.  Mouth/Throat: Oropharynx is clear and moist. No oropharyngeal exudate.  Eyes: Conjunctivae are normal. Right eye exhibits no discharge. Left eye exhibits no discharge. No scleral icterus.  Neck: Normal range of  motion. Neck supple. No JVD present. No tracheal deviation present. No thyromegaly present.  Cardiovascular: Normal rate, regular rhythm, normal heart sounds and intact distal pulses.  Exam reveals no gallop and no friction rub.   No murmur heard. Pulmonary/Chest: Effort normal and breath sounds normal. No stridor. No respiratory distress. She has no wheezes. She has no rales. She exhibits no tenderness.  Abdominal: Soft. Bowel sounds are normal. She exhibits no distension and no mass. There is no tenderness. There is no rebound and no guarding.  Musculoskeletal: Normal range of motion. She exhibits no edema, tenderness or deformity.  Lymphadenopathy:    She has no cervical adenopathy.  Neurological: She is alert and oriented to person, place, and time. She has normal reflexes. She displays normal reflexes. No cranial nerve deficit. She exhibits normal muscle tone. Coordination normal.  Skin: Skin is warm. No rash noted. She is not diaphoretic. No erythema. No pallor.  Vitals reviewed.   Lab Results  Component Value Date   WBC 5.6 12/07/2014   HGB 13.0 12/07/2014   HCT 38.2 12/07/2014   PLT 293.0 12/07/2014   GLUCOSE 84 05/11/2015   CHOL 216 (H) 05/11/2015   TRIG (H) 05/11/2015    575.0 Triglyceride is over 400; calculations on Lipids are invalid.   HDL 36.50 (L) 05/11/2015   LDLDIRECT 59.0 05/11/2015   ALT 11 05/11/2015   AST 14 05/11/2015   NA 141 05/11/2015   K 3.9 05/11/2015   CL 106 05/11/2015   CREATININE 0.90 05/11/2015   BUN 15 05/11/2015   CO2 27 05/11/2015   TSH 1.43 05/11/2015    Mm Digital Screening Bilateral  Result Date: 07/12/2015 CLINICAL DATA:  Screening. EXAM: DIGITAL SCREENING BILATERAL MAMMOGRAM WITH CAD COMPARISON:  Previous exam(s). ACR Breast Density Category c: The breast tissue is heterogeneously dense, which may obscure small masses. FINDINGS: There are no findings suspicious for malignancy. Images were processed with CAD. IMPRESSION: No mammographic  evidence of malignancy. A result letter of this screening mammogram will be mailed directly to the patient. RECOMMENDATION: Screening mammogram in one year. (Code:SM-B-01Y) BI-RADS CATEGORY  1: Negative. Electronically Signed   By: Ammie Ferrier M.D.   On: 07/12/2015 12:58    Assessment & Plan:   [place] was seen today for annual exam and hyperlipidemia.  Diagnoses and all orders for this visit:  Hypertriglyceridemia- I will recheck her triglycerides in the fasting state, if they are add or near 500 then will recommend treatment options with omega-3 fish oils. -     Cancel: Triglycerides; Future -     Lipid panel; Future  Hyperlipidemia with target LDL less than 130- she has decided to stop taking the statin, I will recheck her lipid panel and calculated Framingham risk score and advise further -     Lipid panel; Future  Gastroesophageal reflux disease without esophagitis- her symptoms are well controlled, will monitor her CBC  to see if she has developed an anemia. -     CBC with Differential/Platelet; Future  Routine general medical examination at a health care facility   I have discontinued Ms. Hoopingarner's Multiple Vitamin (STRESS FORMULA 500/BIOTIN PO), atorvastatin, omeprazole, nystatin-triamcinolone ointment, TUCKS, and GAR[place] PO. I am also having her maintain her Ferrous Sulfate, Calcium-Magnesium-Vitamin D (CALCIUM 1200+D3 PO), acetaminophen, vitamin B-12, vitamin E, loratadine, citalopram, mirtazapine, Ergocalciferol (DRISDOL PO), hydrocortisone-pramoxine, and pantoprazole.  Meds ordered this encounter  Medications  . DISCONTD: GARLIC PO    Sig: Take by mouth daily.     Follow-up: Return in about 3 months (around 05/09/2016).  Scarlette Calico, MD

## 2016-02-07 NOTE — Patient Instructions (Signed)
Preventive Care for Adults, Female A healthy lifestyle and preventive care can promote health and wellness. Preventive health guidelines for women include the following key practices.  A routine yearly physical is a good way to check with your health care provider about your health and preventive screening. It is a chance to share any concerns and updates on your health and to receive a thorough exam.  Visit your dentist for a routine exam and preventive care every 6 months. Brush your teeth twice a day and floss once a day. Good oral hygiene prevents tooth decay and gum disease.  The frequency of eye exams is based on your age, health, family medical history, use of contact lenses, and other factors. Follow your health care provider's recommendations for frequency of eye exams.  Eat a healthy diet. Foods like vegetables, fruits, whole grains, low-fat dairy products, and lean protein foods contain the nutrients you need without too many calories. Decrease your intake of foods high in solid fats, added sugars, and salt. Eat the right amount of calories for you.Get information about a proper diet from your health care provider, if necessary.  Regular physical exercise is one of the most important things you can do for your health. Most adults should get at least 150 minutes of moderate-intensity exercise (any activity that increases your heart rate and causes you to sweat) each week. In addition, most adults need muscle-strengthening exercises on 2 or more days a week.  Maintain a healthy weight. The body mass index (BMI) is a screening tool to identify possible weight problems. It provides an estimate of body fat based on height and weight. Your health care provider can find your BMI and can help you achieve or maintain a healthy weight.For adults 20 years and older:  A BMI below 18.5 is considered underweight.  A BMI of 18.5 to 24.9 is normal.  A BMI of 25 to 29.9 is considered overweight.  A  BMI of 30 and above is considered obese.  Maintain normal blood lipids and cholesterol levels by exercising and minimizing your intake of saturated fat. Eat a balanced diet with plenty of fruit and vegetables. Blood tests for lipids and cholesterol should begin at age 45 and be repeated every 5 years. If your lipid or cholesterol levels are high, you are over 50, or you are at high risk for heart disease, you may need your cholesterol levels checked more frequently.Ongoing high lipid and cholesterol levels should be treated with medicines if diet and exercise are not working.  If you smoke, find out from your health care provider how to quit. If you do not use tobacco, do not start.  Lung cancer screening is recommended for adults aged 45-80 years who are at high risk for developing lung cancer because of a history of smoking. A yearly low-dose CT scan of the lungs is recommended for people who have at least a 30-pack-year history of smoking and are a current smoker or have quit within the past 15 years. A pack year of smoking is smoking an average of 1 pack of cigarettes a day for 1 year (for example: 1 pack a day for 30 years or 2 packs a day for 15 years). Yearly screening should continue until the smoker has stopped smoking for at least 15 years. Yearly screening should be stopped for people who develop a health problem that would prevent them from having lung cancer treatment.  If you are pregnant, do not drink alcohol. If you are  breastfeeding, be very cautious about drinking alcohol. If you are not pregnant and choose to drink alcohol, do not have more than 1 drink per day. One drink is considered to be 12 ounces (355 mL) of beer, 5 ounces (148 mL) of wine, or 1.5 ounces (44 mL) of liquor.  Avoid use of street drugs. Do not share needles with anyone. Ask for help if you need support or instructions about stopping the use of drugs.  High blood pressure causes heart disease and increases the risk  of stroke. Your blood pressure should be checked at least every 1 to 2 years. Ongoing high blood pressure should be treated with medicines if weight loss and exercise do not work.  If you are 55-79 years old, ask your health care provider if you should take aspirin to prevent strokes.  Diabetes screening is done by taking a blood sample to check your blood glucose level after you have not eaten for a certain period of time (fasting). If you are not overweight and you do not have risk factors for diabetes, you should be screened once every 3 years starting at age 45. If you are overweight or obese and you are 40-70 years of age, you should be screened for diabetes every year as part of your cardiovascular risk assessment.  Breast cancer screening is essential preventive care for women. You should practice "breast self-awareness." This means understanding the normal appearance and feel of your breasts and may include breast self-examination. Any changes detected, no matter how small, should be reported to a health care provider. Women in their 20s and 30s should have a clinical breast exam (CBE) by a health care provider as part of a regular health exam every 1 to 3 years. After age 40, women should have a CBE every year. Starting at age 40, women should consider having a mammogram (breast X-ray test) every year. Women who have a family history of breast cancer should talk to their health care provider about genetic screening. Women at a high risk of breast cancer should talk to their health care providers about having an MRI and a mammogram every year.  Breast cancer gene (BRCA)-related cancer risk assessment is recommended for women who have family members with BRCA-related cancers. BRCA-related cancers include breast, ovarian, tubal, and peritoneal cancers. Having family members with these cancers may be associated with an increased risk for harmful changes (mutations) in the breast cancer genes BRCA1 and  BRCA2. Results of the assessment will determine the need for genetic counseling and BRCA1 and BRCA2 testing.  Your health care provider may recommend that you be screened regularly for cancer of the pelvic organs (ovaries, uterus, and vagina). This screening involves a pelvic examination, including checking for microscopic changes to the surface of your cervix (Pap test). You may be encouraged to have this screening done every 3 years, beginning at age 21.  For women ages 30-65, health care providers may recommend pelvic exams and Pap testing every 3 years, or they may recommend the Pap and pelvic exam, combined with testing for human papilloma virus (HPV), every 5 years. Some types of HPV increase your risk of cervical cancer. Testing for HPV may also be done on women of any age with unclear Pap test results.  Other health care providers may not recommend any screening for nonpregnant women who are considered low risk for pelvic cancer and who do not have symptoms. Ask your health care provider if a screening pelvic exam is right for   you.  If you have had past treatment for cervical cancer or a condition that could lead to cancer, you need Pap tests and screening for cancer for at least 20 years after your treatment. If Pap tests have been discontinued, your risk factors (such as having a new sexual partner) need to be reassessed to determine if screening should resume. Some women have medical problems that increase the chance of getting cervical cancer. In these cases, your health care provider may recommend more frequent screening and Pap tests.  Colorectal cancer can be detected and often prevented. Most routine colorectal cancer screening begins at the age of 50 years and continues through age 75 years. However, your health care provider may recommend screening at an earlier age if you have risk factors for colon cancer. On a yearly basis, your health care provider may provide home test kits to check  for hidden blood in the stool. Use of a small camera at the end of a tube, to directly examine the colon (sigmoidoscopy or colonoscopy), can detect the earliest forms of colorectal cancer. Talk to your health care provider about this at age 50, when routine screening begins. Direct exam of the colon should be repeated every 5-10 years through age 75 years, unless early forms of precancerous polyps or small growths are found.  People who are at an increased risk for hepatitis B should be screened for this virus. You are considered at high risk for hepatitis B if:  You were born in a country where hepatitis B occurs often. Talk with your health care provider about which countries are considered high risk.  Your parents were born in a high-risk country and you have not received a shot to protect against hepatitis B (hepatitis B vaccine).  You have HIV or AIDS.  You use needles to inject street drugs.  You live with, or have sex with, someone who has hepatitis B.  You get hemodialysis treatment.  You take certain medicines for conditions like cancer, organ transplantation, and autoimmune conditions.  Hepatitis C blood testing is recommended for all people born from 1945 through 1965 and any individual with known risks for hepatitis C.  Practice safe sex. Use condoms and avoid high-risk sexual practices to reduce the spread of sexually transmitted infections (STIs). STIs include gonorrhea, chlamydia, syphilis, trichomonas, herpes, HPV, and human immunodeficiency virus (HIV). Herpes, HIV, and HPV are viral illnesses that have no cure. They can result in disability, cancer, and death.  You should be screened for sexually transmitted illnesses (STIs) including gonorrhea and chlamydia if:  You are sexually active and are younger than 24 years.  You are older than 24 years and your health care provider tells you that you are at risk for this type of infection.  Your sexual activity has changed  since you were last screened and you are at an increased risk for chlamydia or gonorrhea. Ask your health care provider if you are at risk.  If you are at risk of being infected with HIV, it is recommended that you take a prescription medicine daily to prevent HIV infection. This is called preexposure prophylaxis (PrEP). You are considered at risk if:  You are sexually active and do not regularly use condoms or know the HIV status of your partner(s).  You take drugs by injection.  You are sexually active with a partner who has HIV.  Talk with your health care provider about whether you are at high risk of being infected with HIV. If   you choose to begin PrEP, you should first be tested for HIV. You should then be tested every 3 months for as long as you are taking PrEP.  Osteoporosis is a disease in which the bones lose minerals and strength with aging. This can result in serious bone fractures or breaks. The risk of osteoporosis can be identified using a bone density scan. Women ages 67 years and over and women at risk for fractures or osteoporosis should discuss screening with their health care providers. Ask your health care provider whether you should take a calcium supplement or vitamin D to reduce the rate of osteoporosis.  Menopause can be associated with physical symptoms and risks. Hormone replacement therapy is available to decrease symptoms and risks. You should talk to your health care provider about whether hormone replacement therapy is right for you.  Use sunscreen. Apply sunscreen liberally and repeatedly throughout the day. You should seek shade when your shadow is shorter than you. Protect yourself by wearing long sleeves, pants, a wide-brimmed hat, and sunglasses year round, whenever you are outdoors.  Once a month, do a whole body skin exam, using a mirror to look at the skin on your back. Tell your health care provider of new moles, moles that have irregular borders, moles that  are larger than a pencil eraser, or moles that have changed in shape or color.  Stay current with required vaccines (immunizations).  Influenza vaccine. All adults should be immunized every year.  Tetanus, diphtheria, and acellular pertussis (Td, Tdap) vaccine. Pregnant women should receive 1 dose of Tdap vaccine during each pregnancy. The dose should be obtained regardless of the length of time since the last dose. Immunization is preferred during the 27th-36th week of gestation. An adult who has not previously received Tdap or who does not know her vaccine status should receive 1 dose of Tdap. This initial dose should be followed by tetanus and diphtheria toxoids (Td) booster doses every 10 years. Adults with an unknown or incomplete history of completing a 3-dose immunization series with Td-containing vaccines should begin or complete a primary immunization series including a Tdap dose. Adults should receive a Td booster every 10 years.  Varicella vaccine. An adult without evidence of immunity to varicella should receive 2 doses or a second dose if she has previously received 1 dose. Pregnant females who do not have evidence of immunity should receive the first dose after pregnancy. This first dose should be obtained before leaving the health care facility. The second dose should be obtained 4-8 weeks after the first dose.  Human papillomavirus (HPV) vaccine. Females aged 13-26 years who have not received the vaccine previously should obtain the 3-dose series. The vaccine is not recommended for use in pregnant females. However, pregnancy testing is not needed before receiving a dose. If a female is found to be pregnant after receiving a dose, no treatment is needed. In that case, the remaining doses should be delayed until after the pregnancy. Immunization is recommended for any person with an immunocompromised condition through the age of 61 years if she did not get any or all doses earlier. During the  3-dose series, the second dose should be obtained 4-8 weeks after the first dose. The third dose should be obtained 24 weeks after the first dose and 16 weeks after the second dose.  Zoster vaccine. One dose is recommended for adults aged 30 years or older unless certain conditions are present.  Measles, mumps, and rubella (MMR) vaccine. Adults born  before 1957 generally are considered immune to measles and mumps. Adults born in 1957 or later should have 1 or more doses of MMR vaccine unless there is a contraindication to the vaccine or there is laboratory evidence of immunity to each of the three diseases. A routine second dose of MMR vaccine should be obtained at least 28 days after the first dose for students attending postsecondary schools, health care workers, or international travelers. People who received inactivated measles vaccine or an unknown type of measles vaccine during 1963-1967 should receive 2 doses of MMR vaccine. People who received inactivated mumps vaccine or an unknown type of mumps vaccine before 1979 and are at high risk for mumps infection should consider immunization with 2 doses of MMR vaccine. For females of childbearing age, rubella immunity should be determined. If there is no evidence of immunity, females who are not pregnant should be vaccinated. If there is no evidence of immunity, females who are pregnant should delay immunization until after pregnancy. Unvaccinated health care workers born before 1957 who lack laboratory evidence of measles, mumps, or rubella immunity or laboratory confirmation of disease should consider measles and mumps immunization with 2 doses of MMR vaccine or rubella immunization with 1 dose of MMR vaccine.  Pneumococcal 13-valent conjugate (PCV13) vaccine. When indicated, a person who is uncertain of his immunization history and has no record of immunization should receive the PCV13 vaccine. All adults 65 years of age and older should receive this  vaccine. An adult aged 19 years or older who has certain medical conditions and has not been previously immunized should receive 1 dose of PCV13 vaccine. This PCV13 should be followed with a dose of pneumococcal polysaccharide (PPSV23) vaccine. Adults who are at high risk for pneumococcal disease should obtain the PPSV23 vaccine at least 8 weeks after the dose of PCV13 vaccine. Adults older than 69 years of age who have normal immune system function should obtain the PPSV23 vaccine dose at least 1 year after the dose of PCV13 vaccine.  Pneumococcal polysaccharide (PPSV23) vaccine. When PCV13 is also indicated, PCV13 should be obtained first. All adults aged 65 years and older should be immunized. An adult younger than age 65 years who has certain medical conditions should be immunized. Any person who resides in a nursing home or long-term care facility should be immunized. An adult smoker should be immunized. People with an immunocompromised condition and certain other conditions should receive both PCV13 and PPSV23 vaccines. People with human immunodeficiency virus (HIV) infection should be immunized as soon as possible after diagnosis. Immunization during chemotherapy or radiation therapy should be avoided. Routine use of PPSV23 vaccine is not recommended for American Indians, [place] Natives, or people younger than 65 years unless there are medical conditions that require PPSV23 vaccine. When indicated, people who have unknown immunization and have no record of immunization should receive PPSV23 vaccine. One-time revaccination 5 years after the first dose of PPSV23 is recommended for people aged 19-64 years who have chronic kidney failure, nephrotic syndrome, asplenia, or immunocompromised conditions. People who received 1-2 doses of PPSV23 before age 65 years should receive another dose of PPSV23 vaccine at age 65 years or later if at least 5 years have passed since the previous dose. Doses of PPSV23 are not  needed for people immunized with PPSV23 at or after age 65 years.  Meningococcal vaccine. Adults with asplenia or persistent complement component deficiencies should receive 2 doses of quadrivalent meningococcal conjugate (MenACWY-D) vaccine. The doses should be obtained   at least 2 months apart. Microbiologists working with certain meningococcal bacteria, [place] recruits, people at risk during an outbreak, and people who travel to or live in countries with a high rate of meningitis should be immunized. A first-year college student up through age 34 years who is living in a residence hall should receive a dose if she did not receive a dose on or after her 16th birthday. Adults who have certain high-risk conditions should receive one or more doses of vaccine.  Hepatitis A vaccine. Adults who wish to be protected from this disease, have certain high-risk conditions, work with hepatitis A-infected animals, work in hepatitis A research labs, or travel to or work in countries with a high rate of hepatitis A should be immunized. Adults who were previously unvaccinated and who anticipate close contact with an international adoptee during the first 60 days after arrival in the [place] from a country with a high rate of hepatitis A should be immunized.  Hepatitis B vaccine. Adults who wish to be protected from this disease, have certain high-risk conditions, may be exposed to blood or other infectious body fluids, are household contacts or sex partners of hepatitis B positive people, are clients or workers in certain care facilities, or travel to or work in countries with a high rate of hepatitis B should be immunized.  Haemophilus influenzae type b (Hib) vaccine. A previously unvaccinated person with asplenia or sickle cell disease or having a scheduled splenectomy should receive 1 dose of Hib vaccine. Regardless of previous immunization, a recipient of a hematopoietic stem cell transplant should receive a  3-dose series 6-12 months after her successful transplant. Hib vaccine is not recommended for adults with HIV infection. Preventive Services / Frequency Ages 35 to 4 years  Blood pressure check.** / Every 3-5 years.  Lipid and cholesterol check.** / Every 5 years beginning at age 60.  Clinical breast exam.** / Every 3 years for women in their 71s and 10s.  BRCA-related cancer risk assessment.** / For women who have family members with a BRCA-related cancer (breast, ovarian, tubal, or peritoneal cancers).  Pap test.** / Every 2 years from ages 76 through 26. Every 3 years starting at age 61 through age 76 or 93 with a history of 3 consecutive normal Pap tests.  HPV screening.** / Every 3 years from ages 37 through ages 60 to 51 with a history of 3 consecutive normal Pap tests.  Hepatitis C blood test.** / For any individual with known risks for hepatitis C.  Skin self-exam. / Monthly.  Influenza vaccine. / Every year.  Tetanus, diphtheria, and acellular pertussis (Tdap, Td) vaccine.** / Consult your health care provider. Pregnant women should receive 1 dose of Tdap vaccine during each pregnancy. 1 dose of Td every 10 years.  Varicella vaccine.** / Consult your health care provider. Pregnant females who do not have evidence of immunity should receive the first dose after pregnancy.  HPV vaccine. / 3 doses over 6 months, if 93 and younger. The vaccine is not recommended for use in pregnant females. However, pregnancy testing is not needed before receiving a dose.  Measles, mumps, rubella (MMR) vaccine.** / You need at least 1 dose of MMR if you were born in 1957 or later. You may also need a 2nd dose. For females of childbearing age, rubella immunity should be determined. If there is no evidence of immunity, females who are not pregnant should be vaccinated. If there is no evidence of immunity, females who are  pregnant should delay immunization until after pregnancy.  Pneumococcal  13-valent conjugate (PCV13) vaccine.** / Consult your health care provider.  Pneumococcal polysaccharide (PPSV23) vaccine.** / 1 to 2 doses if you smoke cigarettes or if you have certain conditions.  Meningococcal vaccine.** / 1 dose if you are age 68 to 8 years and a Market researcher living in a residence hall, or have one of several medical conditions, you need to get vaccinated against meningococcal disease. You may also need additional booster doses.  Hepatitis A vaccine.** / Consult your health care provider.  Hepatitis B vaccine.** / Consult your health care provider.  Haemophilus influenzae type b (Hib) vaccine.** / Consult your health care provider. Ages 7 to 53 years  Blood pressure check.** / Every year.  Lipid and cholesterol check.** / Every 5 years beginning at age 25 years.  Lung cancer screening. / Every year if you are aged 11-80 years and have a 30-pack-year history of smoking and currently smoke or have quit within the past 15 years. Yearly screening is stopped once you have quit smoking for at least 15 years or develop a health problem that would prevent you from having lung cancer treatment.  Clinical breast exam.** / Every year after age 48 years.  BRCA-related cancer risk assessment.** / For women who have family members with a BRCA-related cancer (breast, ovarian, tubal, or peritoneal cancers).  Mammogram.** / Every year beginning at age 41 years and continuing for as long as you are in good health. Consult with your health care provider.  Pap test.** / Every 3 years starting at age 65 years through age 37 or 70 years with a history of 3 consecutive normal Pap tests.  HPV screening.** / Every 3 years from ages 72 years through ages 60 to 40 years with a history of 3 consecutive normal Pap tests.  Fecal occult blood test (FOBT) of stool. / Every year beginning at age 21 years and continuing until age 5 years. You may not need to do this test if you get  a colonoscopy every 10 years.  Flexible sigmoidoscopy or colonoscopy.** / Every 5 years for a flexible sigmoidoscopy or every 10 years for a colonoscopy beginning at age 35 years and continuing until age 48 years.  Hepatitis C blood test.** / For all people born from [birth year] through 1965 and any individual with known risks for hepatitis C.  Skin self-exam. / Monthly.  Influenza vaccine. / Every year.  Tetanus, diphtheria, and acellular pertussis (Tdap/Td) vaccine.** / Consult your health care provider. Pregnant women should receive 1 dose of Tdap vaccine during each pregnancy. 1 dose of Td every 10 years.  Varicella vaccine.** / Consult your health care provider. Pregnant females who do not have evidence of immunity should receive the first dose after pregnancy.  Zoster vaccine.** / 1 dose for adults aged 30 years or older.  Measles, mumps, rubella (MMR) vaccine.** / You need at least 1 dose of MMR if you were born in 1957 or later. You may also need a second dose. For females of childbearing age, rubella immunity should be determined. If there is no evidence of immunity, females who are not pregnant should be vaccinated. If there is no evidence of immunity, females who are pregnant should delay immunization until after pregnancy.  Pneumococcal 13-valent conjugate (PCV13) vaccine.** / Consult your health care provider.  Pneumococcal polysaccharide (PPSV23) vaccine.** / 1 to 2 doses if you smoke cigarettes or if you have certain conditions.  Meningococcal vaccine.** /  Consult your health care provider.  Hepatitis A vaccine.** / Consult your health care provider.  Hepatitis B vaccine.** / Consult your health care provider.  Haemophilus influenzae type b (Hib) vaccine.** / Consult your health care provider. Ages 64 years and over  Blood pressure check.** / Every year.  Lipid and cholesterol check.** / Every 5 years beginning at age 23 years.  Lung cancer screening. / Every year if you  are aged 16-80 years and have a 30-pack-year history of smoking and currently smoke or have quit within the past 15 years. Yearly screening is stopped once you have quit smoking for at least 15 years or develop a health problem that would prevent you from having lung cancer treatment.  Clinical breast exam.** / Every year after age 74 years.  BRCA-related cancer risk assessment.** / For women who have family members with a BRCA-related cancer (breast, ovarian, tubal, or peritoneal cancers).  Mammogram.** / Every year beginning at age 44 years and continuing for as long as you are in good health. Consult with your health care provider.  Pap test.** / Every 3 years starting at age 58 years through age 22 or 39 years with 3 consecutive normal Pap tests. Testing can be stopped between 65 and 70 years with 3 consecutive normal Pap tests and no abnormal Pap or HPV tests in the past 10 years.  HPV screening.** / Every 3 years from ages 64 years through ages 70 or 61 years with a history of 3 consecutive normal Pap tests. Testing can be stopped between 65 and 70 years with 3 consecutive normal Pap tests and no abnormal Pap or HPV tests in the past 10 years.  Fecal occult blood test (FOBT) of stool. / Every year beginning at age 40 years and continuing until age 27 years. You may not need to do this test if you get a colonoscopy every 10 years.  Flexible sigmoidoscopy or colonoscopy.** / Every 5 years for a flexible sigmoidoscopy or every 10 years for a colonoscopy beginning at age 7 years and continuing until age 32 years.  Hepatitis C blood test.** / For all people born from [birth year] through 1964 and any individual with known risks for hepatitis C.  Osteoporosis screening.** / A one-time screening for women ages 30 years and over and women at risk for fractures or osteoporosis.  Skin self-exam. / Monthly.  Influenza vaccine. / Every year.  Tetanus, diphtheria, and acellular pertussis (Tdap/Td)  vaccine.** / 1 dose of Td every 10 years.  Varicella vaccine.** / Consult your health care provider.  Zoster vaccine.** / 1 dose for adults aged 35 years or older.  Pneumococcal 13-valent conjugate (PCV13) vaccine.** / Consult your health care provider.  Pneumococcal polysaccharide (PPSV23) vaccine.** / 1 dose for all adults aged 46 years and older.  Meningococcal vaccine.** / Consult your health care provider.  Hepatitis A vaccine.** / Consult your health care provider.  Hepatitis B vaccine.** / Consult your health care provider.  Haemophilus influenzae type b (Hib) vaccine.** / Consult your health care provider. ** Family history and personal history of risk and conditions may change your health care provider's recommendations.   This information is not intended to replace advice given to you by your health care provider. Make sure you discuss any questions you have with your health care provider.   Document Released: 08/15/2001 Document Revised: 07/10/2014 Document Reviewed: 11/14/2010 Elsevier Interactive Patient Education [employer].

## 2016-02-07 NOTE — Progress Notes (Signed)
Pre visit review using our clinic review tool, if applicable. No additional management support is needed unless otherwise documented below in the visit note. 

## 2016-02-08 DIAGNOSIS — Z0000 Encounter for general adult medical examination without abnormal findings: Secondary | ICD-10-CM | POA: Insufficient documentation

## 2016-02-08 NOTE — Assessment & Plan Note (Signed)

## 2016-02-16 ENCOUNTER — Other Ambulatory Visit: Payer: Self-pay | Admitting: Internal Medicine

## 2016-03-14 ENCOUNTER — Encounter: Payer: Self-pay | Admitting: Internal Medicine

## 2016-03-14 ENCOUNTER — Other Ambulatory Visit (INDEPENDENT_AMBULATORY_CARE_PROVIDER_SITE_OTHER): Payer: Commercial Managed Care - HMO

## 2016-03-14 DIAGNOSIS — K219 Gastro-esophageal reflux disease without esophagitis: Secondary | ICD-10-CM | POA: Diagnosis not present

## 2016-03-14 DIAGNOSIS — E785 Hyperlipidemia, unspecified: Secondary | ICD-10-CM | POA: Diagnosis not present

## 2016-03-14 DIAGNOSIS — R7989 Other specified abnormal findings of blood chemistry: Secondary | ICD-10-CM | POA: Diagnosis not present

## 2016-03-14 DIAGNOSIS — E781 Pure hyperglyceridemia: Secondary | ICD-10-CM

## 2016-03-14 LAB — LIPID PANEL
CHOL/HDL RATIO: 7
Cholesterol: 301 mg/dL — ABNORMAL HIGH (ref 0–200)
HDL: 42.4 mg/dL (ref >39.00)
NONHDL: 258.49
Triglycerides: 375 mg/dL — ABNORMAL HIGH (ref 0.0–149.0)
VLDL: 75 mg/dL — ABNORMAL HIGH (ref 0.0–40.0)

## 2016-03-14 LAB — CBC WITH DIFFERENTIAL/PLATELET
BASOS ABS: 0 10*3/uL (ref 0.0–0.1)
Basophils Relative: 0.2 % (ref 0.0–3.0)
EOS PCT: 0.7 % (ref 0.0–5.0)
Eosinophils Absolute: 0 10*3/uL (ref 0.0–0.7)
HCT: 40.5 % (ref 36.0–46.0)
HEMOGLOBIN: 13.9 g/dL (ref 12.0–15.0)
Lymphocytes Relative: 39.4 % (ref 12.0–46.0)
Lymphs Abs: 1.8 10*3/uL (ref 0.7–4.0)
MCHC: 34.3 g/dL (ref 30.0–36.0)
MCV: 85.8 fl (ref 78.0–100.0)
MONO ABS: 0.4 10*3/uL (ref 0.1–1.0)
Monocytes Relative: 8 % (ref 3.0–12.0)
Neutro Abs: 2.4 10*3/uL (ref 1.4–7.7)
Neutrophils Relative %: 51.7 % (ref 43.0–77.0)
Platelets: 298 10*3/uL (ref 150.0–400.0)
RBC: 4.72 Mil/uL (ref 3.87–5.11)
RDW: 14.5 % (ref 11.5–15.5)
WBC: 4.6 10*3/uL (ref 4.0–10.5)

## 2016-03-14 LAB — LDL CHOLESTEROL, DIRECT: LDL DIRECT: 138 mg/dL

## 2016-03-22 ENCOUNTER — Telehealth: Payer: Self-pay | Admitting: *Deleted

## 2016-03-22 NOTE — Telephone Encounter (Signed)
Left msg on triage stating recieved lab letter, and wanting to let md know that she would like to start something for her cholesterol.Marland KitchenJohny Patterson

## 2016-03-23 NOTE — Telephone Encounter (Signed)
Called pt no answer LMOM MD is on vacation but will forward to his desktop to address when he return on 9/26...Johny Chess

## 2016-03-27 ENCOUNTER — Other Ambulatory Visit: Payer: Self-pay | Admitting: Internal Medicine

## 2016-03-27 DIAGNOSIS — E785 Hyperlipidemia, unspecified: Secondary | ICD-10-CM

## 2016-03-27 MED ORDER — ATORVASTATIN CALCIUM 40 MG PO TABS: 40.0000 mg | ORAL_TABLET | Freq: Every day | ORAL | 3 refills | Status: DC

## 2016-03-27 NOTE — Telephone Encounter (Signed)
RX sent

## 2016-03-28 NOTE — Telephone Encounter (Signed)
LVM for pt to call back as soon as possible.   RE: Atorvastatin has been sent to pt pharmacy.

## 2016-03-28 NOTE — Telephone Encounter (Signed)
Called pt no answer LMOM w/MD recommendation. Rx has been sent to [facility]...Johny Chess

## 2016-04-17 ENCOUNTER — Encounter: Payer: Self-pay | Admitting: Neurology

## 2016-04-17 ENCOUNTER — Ambulatory Visit (INDEPENDENT_AMBULATORY_CARE_PROVIDER_SITE_OTHER): Payer: Commercial Managed Care - HMO | Admitting: Neurology

## 2016-04-17 VITALS — BP 128/74 | HR 91 | Temp 98.1°F | Ht 63.25 in | Wt 136.2 lb

## 2016-04-17 DIAGNOSIS — R413 Other amnesia: Secondary | ICD-10-CM | POA: Diagnosis not present

## 2016-04-17 NOTE — Progress Notes (Signed)
NEUROLOGY FOLLOW UP OFFICE NOTE  KESSIAH MONTELEONE PY:[id number]  HISTORY OF PRESENT ILLNESS: I had the pleasure of seeing Charlene Patterson in follow-up in the neurology clinic on 04/17/2016.  The patient was last seen on 10 months ago for worsening memory. MMSE in 06/2015 was 30/30. Records and images were personally reviewed where available.  I personally reviewed MRI brain without contrast which was normal. TSH and B12 normal. Since her last visit, she reports getting lost a couple of times driving, but could figure out the way soon enough. She is now living with her grandson, but missed a bill payment before moving, thinking she had paid it already. She forgets her medications at times. She takes care of her grandchildren and has some difficulties remembering to pick her 69 year old grandchild up sometimes. She has had a sharp pain on the right occipital region for the past 3-4 days and neck pain. She has occasional dizziness when standing and reaching up, she gets nauseated. She has occasional numbness in both hands. No falls.   HPI 06/18/2015: This is a 70 yo RH woman with a history of hyperlipidemia, chronic back pain,who presented for worsening memory. She reports that her memory is "not too good," and started noticing changes gradually over the past few years. She reports problems mostly with short-term memory, sometimes she forgets to take her medications and her son would remind her. She misplaces things frequently and has been told that she repeats herself. She left the stove on a few times. She has to write everything down. She denies getting lost driving, and reports she can find her way back if she makes a wrong turn. She lives with her son with cerebral palsy, and denies any missed bill payments. They are planning to move in with her grandson's family soon.   She has a history of migraines since childhood, worse with weather changes and loud sounds and smells. She has occasional dizziness and gets  motion sick quickly. She denies any diplopia, blurred vision, dysarthria, dysphagia. She has right hand and arm numbness and tingling when driving. She has chronic neck and back pain, at times triggering her headaches. She has problems with her rectum, but denies any constipation. No anosmia or tremors. She became tearful reporting that her husband passed away in 10/19/2010, and reports mood is "up and down." She denies any history of head injuries or alcohol use. Her younger sister has memory problems and got lost driving at age 37.  PAST MEDICAL HISTORY: Past Medical History:  Diagnosis Date  . Allergy   . Anxiety   . Arthritis    "left foot" (05/26/2014)  . Chronic back pain   . Chronic neck pain   . Depression   . GERD (gastroesophageal reflux disease)   . History of chicken pox   . History of colon polyps   . History of hiatal hernia   . Lower GI bleeding   . Migraines    "monthly usually; weekly lately" (05/26/2014)  . Sciatica   . Thoracic compression fracture [facility])     MEDICATIONS: Current Outpatient Prescriptions on File Prior to Visit  Medication Sig Dispense Refill  . acetaminophen (TYLENOL) 650 MG CR tablet Take 650 mg by mouth every 8 (eight) hours as needed for pain.    Marland Kitchen atorvastatin (LIPITOR) 40 MG tablet Take 1 tablet (40 mg total) by mouth daily. 90 tablet 3  . Calcium-Magnesium-Vitamin D (CALCIUM 1200+D3 PO) Take 2 capsules by mouth daily.    Marland Kitchen  citalopram (CELEXA) 10 MG tablet TAKE ONE TABLET BY MOUTH ONCE DAILY 30 tablet 11  . Ergocalciferol (DRISDOL PO) Take 3,000 Units by mouth daily.    . Ferrous Sulfate (RA IRON) 27 MG TABS Take 1 tablet by mouth as needed (low iron level).     . hydrocortisone-pramoxine (PROCTOFOAM HC) rectal foam Place 1 applicator rectally 2 (two) times daily. 10 g 2  . loratadine (CLARITIN) 10 MG tablet Take 10 mg by mouth at bedtime as needed (helps with sleep and headaches).    . mirtazapine (REMERON) 7.5 MG tablet TAKE ONE TABLET BY MOUTH AT  BEDTIME 30 tablet 11  . omeprazole (PRILOSEC) 20 MG capsule TAKE ONE CAPSULE BY MOUTH ONCE DAILY 30 capsule 11  . pantoprazole (PROTONIX) 40 MG tablet TAKE ONE TABLET BY MOUTH DAILY 90 tablet 2  . vitamin B-12 (CYANOCOBALAMIN) 1000 MCG tablet Take 1,000 mcg by mouth daily.    . vitamin E 400 UNIT capsule Take 400 Units by mouth daily.     No current facility-administered medications on file prior to visit.     ALLERGIES: No Known Allergies  FAMILY HISTORY: Family History  Problem Relation Age of Onset  . Heart disease Mother   . Heart disease Father   . Prostate cancer Father   . Diabetes Sister   . Stomach cancer Sister 78  . Diabetes Brother   . Heart disease Brother   . Rectal cancer Neg Hx   . Colon cancer Neg Hx     SOCIAL HISTORY: Social History   Social History  . Marital status: Widowed    Spouse name: N/A  . Number of children: 2  . Years of education: 12   Occupational History  . Retired    Social History Main Topics  . Smoking status: Never Smoker  . Smokeless tobacco: Never Used  . Alcohol use No  . Drug use: No  . Sexual activity: No   Other Topics Concern  . Not on file   Social History Narrative   Regular exercise-no   Caffeine Use-yes    REVIEW OF SYSTEMS: Constitutional: No fevers, chills, or sweats, no generalized fatigue, change in appetite Eyes: No visual changes, double vision, eye pain Ear, nose and throat: No hearing loss, ear pain, nasal congestion, sore throat Cardiovascular: No chest pain, palpitations Respiratory:  No shortness of breath at rest or with exertion, wheezes GastrointestinaI: No nausea, vomiting, diarrhea, abdominal pain, fecal incontinence Genitourinary:  No dysuria, urinary retention or frequency Musculoskeletal:  No neck pain, back pain Integumentary: No rash, pruritus, skin lesions Neurological: as above Psychiatric: No depression, insomnia, anxiety Endocrine: No palpitations, fatigue, diaphoresis, mood  swings, change in appetite, change in weight, increased thirst Hematologic/Lymphatic:  No anemia, purpura, petechiae. Allergic/Immunologic: no itchy/runny eyes, nasal congestion, recent allergic reactions, rashes  PHYSICAL EXAM: Vitals:   04/17/16 1547  BP: 128/74  Pulse: 91  Temp: 98.1 F (36.7 C)   General: No acute distress Head:  Normocephalic/atraumatic Neck: supple, no paraspinal tenderness, full range of motion Heart:  Regular rate and rhythm Lungs:  Clear to auscultation bilaterally Back: No paraspinal tenderness Skin/Extremities: No rash, no edema Neurological Exam: alert and oriented to person, place, and time. No aphasia or dysarthria. Fund of knowledge is appropriate.  Recent and remote memory are intact.  Attention and concentration are normal.    Able to name objects and repeat phrases. CDT 5/5  MMSE - Mini Mental State Exam 04/17/2016 06/18/2015  Orientation to time 5 5  Orientation to Place 5 5  Registration 3 3  Attention/ Calculation 5 5  Recall 3 3  Language- name 2 objects 2 2  Language- repeat 1 1  Language- follow 3 step command 3 3  Language- read & follow direction 1 1  Write a sentence 1 1  Copy design 1 1  Total score 30 30    Cranial nerves: Pupils equal, round. No facial asymmetry. Tongue, uvula, palate midline.  Motor: moves all extremities symmetrically. Gait narrow-based and steady.  IMPRESSION: This is a 70 yo RH woman with hyperlipidemia who presented with worsening memory. MMSE today again 30/30, similar to previous in December 2016. Her MRI brain is unremarkable. She however reports getting lost driving, missing bill payments and medications, concerning for possible dementia. Neuropsychological evaluation will be ordered to further evaluate her symptoms. No clear indication to start cholinesterase inhibitors at this time, this will be reassessed after Neuropsych. We again discussed the importance of control of vascular risk factors, physical  exercise, and brain stimulation exercises for brain health. She will follow-up in 3 months.  Thank you for allowing me to participate in her care.  Please do not hesitate to call for any questions or concerns.  The duration of this appointment visit was 25 minutes of face-to-face time with the patient.  Greater than 50% of this time was spent in counseling, explanation of diagnosis, planning of further management, and coordination of care.   Ellouise Newer, M.D.   CC: Dr. Ronnald Ramp

## 2016-04-17 NOTE — Patient Instructions (Signed)
1. Schedule Neuropsychological evaluation with Dr. Si Raider 2. Control of blood pressure, cholesterol, as well as physical exercise and brain stimulation exercises are important for brain health 3. Follow-up in 3 months

## 2016-05-18 ENCOUNTER — Encounter: Payer: Self-pay | Admitting: Psychology

## 2016-05-18 ENCOUNTER — Ambulatory Visit (INDEPENDENT_AMBULATORY_CARE_PROVIDER_SITE_OTHER): Payer: Commercial Managed Care - HMO | Admitting: Psychology

## 2016-05-18 DIAGNOSIS — F339 Major depressive disorder, recurrent, unspecified: Secondary | ICD-10-CM

## 2016-05-18 DIAGNOSIS — R413 Other amnesia: Secondary | ICD-10-CM | POA: Diagnosis not present

## 2016-05-18 NOTE — Progress Notes (Signed)
NEUROPSYCHOLOGICAL INTERVIEW (CPT: [id number])  Name: Charlene Patterson Date of Birth: [birth year] Date of Interview: 05/18/2016  Reason for Referral:  Charlene Patterson is a 70 y.o., right-handed female who is referred for neuropsychological evaluation by Dr. Ellouise Newer of [facility] due to concerns about memory loss. This patient is unaccompanied in the office for today's visit.  History of Presenting Problem:  Charlene Patterson reported gradual onset of memory difficulties with worsening over time. She is unsure where she first started noticing memory difficulty but she saw Dr. Delice Lesch for initial consultation in 06/2015. She scored 30/30 on the MMSE at that appointment. She had a brain MRI on 07/01/2015 and according to the report this was normal for age.   The patient reported that she frequently forgets things she is supposed to do. She regularly forgets to take her medications. She lives with her grandson and his family, and they tell her that she repeats questions and forgets recent conversations they've had.   Upon direct questioning, the patient also endorsed frequently misplacing/losing items, forgetting appointments or other obligations, difficulty concentrating, starting but not finishing tasks, distractibility, slowed processing, word finding difficulty, and more difficulty with navigation when driving. She reported there are times when she is driving and does not know where she is or where she is going, but she "figures it out" relatively quickly. She did note that she makes more wrong turns when driving. She mainly drives to familiar and local locations.  The patient continues to manage all instrumental ADLs, including driving, appointments, finances, medications and some cooking. She reported some difficulty managing her bills. She has not been doing much cooking since moving in with her grandson's family in June of this year. In the home is herself, her son with cerebral palsy (he has  lived with her his entire life), her grandson, grandson's wife, and two great-grandchildren (88yo and 67 mo old). The patient provides a lot of the care for her 90-month-old great-grandson. She is very busy with this and while she enjoys it, it can be stressful.  Physically, the patient complains of arthritic pain and migraines. She also reported significant fatigue. She has difficulty sleeping due to frequent awakening. She also reported poor appetite. She denied difficulty with balance or walking. She has not had any falls.  Current mood is depressed. She reports she is more irritable and less patient. She first reported that her depression started while her husband was sick. She cared for him for seven years before he passed away in 10-04-10. However, later, she admitted that she had depression many years ago and has a remote history of one suicide attempt. She overdosed on medications and had to be taken to the ER to have her stomach pumped. She has only shared this with her husband and one doctor. She has never had treatment from a mental health provider. Grief counseling has been offered in the past but she declined, feeling that she could handle it on her own. She started taking psychotropic medication after her husband passed. She currently takes Celexa and Remeron. Upon direct questioning, she admitted that she experiences possible auditory hallucinations. She frequently hears noises and voices that others cannot hear. She says this was worse in her old home but it does still occur in her grandson's home. She reported that it is not bothersome or upsetting to her, however. She denied any visual hallucinations. She admits to wishing she were dead and passive suicidal ideation. However, she denies intention or planning,  stating that she knows she would go to hell if she took her life. She reported, "God is what keeps me going."   Social History: Ms. Hengst was born and raised in [place]. 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 However, she did graduate from high school. 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 She was married to her husband for 44 years. He passed away in 10/26/10. She has two adult children (son and daughter). She does not consume alcohol. She has never been a smoker or tobacco user. She denied past or present substance abuse.   Medical History: Past Medical History:  Diagnosis Date  . Allergy   . Anxiety   . Arthritis    "left foot" (05/26/2014)  . Chronic back pain   . Chronic neck pain   . Depression   . GERD (gastroesophageal reflux disease)   . History of chicken pox   . History of colon polyps   . History of hiatal hernia   . Lower GI bleeding   . Migraines    "monthly usually; weekly lately" (05/26/2014)  . Sciatica   . Thoracic compression fracture [facility])      Current Medications:  Outpatient Encounter Prescriptions as of 05/18/2016  Medication Sig  . acetaminophen (TYLENOL) 650 MG CR tablet Take 650 mg by mouth every 8 (eight) hours as needed for pain.  Marland Kitchen atorvastatin (LIPITOR) 40 MG tablet Take 1 tablet (40 mg total) by mouth daily.  . Calcium-Magnesium-Vitamin D (CALCIUM 1200+D3 PO) Take 2 capsules by mouth daily.  . citalopram (CELEXA) 10 MG tablet TAKE ONE TABLET BY MOUTH ONCE DAILY  . Ergocalciferol (DRISDOL PO) Take 3,000 Units by mouth daily.  . Ferrous Sulfate (RA IRON) 27 MG TABS Take 1 tablet by mouth as needed (low iron level).   . hydrocortisone-pramoxine (PROCTOFOAM HC) rectal foam Place 1 applicator rectally 2 (two) times daily.  Marland Kitchen loratadine (CLARITIN) 10 MG tablet Take 10 mg by mouth at bedtime as needed (helps with sleep and headaches).  . mirtazapine (REMERON) 7.5 MG tablet TAKE ONE TABLET BY MOUTH AT BEDTIME  . omeprazole (PRILOSEC) 20 MG capsule TAKE ONE CAPSULE BY MOUTH ONCE DAILY  . pantoprazole  (PROTONIX) 40 MG tablet TAKE ONE TABLET BY MOUTH DAILY  . vitamin B-12 (CYANOCOBALAMIN) 1000 MCG tablet Take 1,000 mcg by mouth daily.  . vitamin E 400 UNIT capsule Take 400 Units by mouth daily.   No facility-administered encounter medications on file as of 05/18/2016.      Behavioral Observations:   Appearance: Neatly and appropriately dressed and groomed Gait: Ambulated independently, no abnormalities observed Speech: Fluent, somewhat quiet and mildly slow speech Thought process: Linear, goal directed Affect: Tearful Interpersonal: Pleasant, appropriate   TESTING: There is medical necessity to proceed with neuropsychological assessment as the results will be used to aid in differential diagnosis and clinical decision-making and to inform specific treatment recommendations. Per the patient and medical records reviewed, there has been a change in cognitive functioning and a reasonable suspicion of dementia. There is a need for objective testing of her subjective memory complaints in order to determine neurologic etiology versus psychogenic etiology (ie, pseudodementia due to depression).    PLAN: The patient will return for a full battery of neuropsychological testing with a psychometrician under my supervision. Education regarding testing procedures was provided.  Subsequently, the patient will see this provider for a follow-up session at which time her test performances and my impressions and treatment recommendations will be reviewed in detail.   Full neuropsychological evaluation report to follow.

## 2016-05-29 ENCOUNTER — Ambulatory Visit (INDEPENDENT_AMBULATORY_CARE_PROVIDER_SITE_OTHER): Payer: Commercial Managed Care - HMO | Admitting: Psychology

## 2016-05-29 DIAGNOSIS — R413 Other amnesia: Secondary | ICD-10-CM | POA: Diagnosis not present

## 2016-05-29 NOTE — Progress Notes (Signed)
   Neuropsychology Note  Charlene Patterson returned today for 2 hours of neuropsychological testing with technician, Milana Kidney, BS, under the supervision of Dr. Macarthur Critchley. The patient did not appear overtly distressed by the testing session, per behavioral observation or via self-report to the technician. Rest breaks were offered. Charlene Patterson will return within 2 weeks for a feedback session with Dr. Si Raider at which time her test performances, clinical impressions and treatment recommendations will be reviewed in detail. The patient understands she can contact our office should she require our assistance before this time.  Full report to follow.

## 2016-06-05 NOTE — Progress Notes (Deleted)
NEUROPSYCHOLOGICAL EVALUATION   Name:    Charlene Patterson  Date of Birth:   [birth year] Date of Interview:  05/18/2016 Date of Testing:  05/29/2016   Date of Feedback:  06/06/2016       Background Information:  Reason for Referral:  Charlene Patterson is a 70 y.o., right-handed female referred by Dr. Ellouise Newer to assess her current level of cognitive functioning and assist in differential diagnosis. The current evaluation consisted of a review of available medical records, an interview with the patient, and the completion of a neuropsychological testing battery. Informed consent was obtained.  History of Presenting Problem:  Charlene Patterson reported gradual onset of memory difficulties with worsening over time. She is unsure where she first started noticing memory difficulty but she saw Dr. Delice Lesch for initial consultation in 06/2015. She scored 30/30 on the MMSE at that appointment. She had a brain MRI on 07/01/2015 and according to the report this was normal for age.   The patient reported that she frequently forgets things she is supposed to do. She regularly forgets to take her medications. She lives with her grandson and his family, and they tell her that she repeats questions and forgets recent conversations they've had.   Upon direct questioning, the patient also endorsed frequently misplacing/losing items, forgetting appointments or other obligations, difficulty concentrating, starting but not finishing tasks, distractibility, slowed processing, word finding difficulty, and more difficulty with navigation when driving. She reported there are times when she is driving and does not know where she is or where she is going, but she "figures it out" relatively quickly. She did note that she makes more wrong turns when driving. She mainly drives to familiar and local locations.  The patient continues to manage all instrumental ADLs, including driving, appointments, finances, medications and  some cooking. She reported some difficulty managing her bills. She has not been doing much cooking since moving in with her grandson's family in June of this year. In the home is herself, her son with cerebral palsy (he has lived with her his entire life), her grandson, grandson's wife, and two great-grandchildren (71yo and 46 mo old). The patient provides a lot of the care for her 67-month-old great-grandson. She is very busy with this and while she enjoys it, it can be stressful.  Physically, the patient complains of arthritic pain and migraines. She also reported significant fatigue. She has difficulty sleeping due to frequent awakening. She also reported poor appetite. She denied difficulty with balance or walking. She has not had any falls.  Current mood is depressed. She reports she is more irritable and less patient. She first reported that her depression started while her husband was sick. She cared for him for seven years before he passed away in 20-Oct-2010. However, later, she admitted that she had depression many years ago and has a remote history of one suicide attempt. She overdosed on medications and had to be taken to the ER to have her stomach pumped. She has only shared this with her husband and one doctor. She has never had treatment from a mental health provider. Grief counseling has been offered in the past but she declined, feeling that she could handle it on her own. She started taking psychotropic medication after her husband passed. She currently takes Celexa and Remeron. Upon direct questioning, she admitted that she experiences possible auditory hallucinations. She frequently hears noises and voices that others cannot hear. She says this was worse in her old  home but it does still occur in her grandson's home. She reported that it is not bothersome or upsetting to her, however. She denied any visual hallucinations. She admits to wishing she were dead and passive suicidal ideation. However,  she denies intention or planning, stating that she knows she would go to hell if she took her life. She reported, "God is what keeps me going."   Social History: Charlene Patterson was born and raised in [place]. 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 However, she did graduate from high school. 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 She was married to her husband for 44 years. He passed away in 2010-10-18. She has two adult children (son and daughter). She does not consume alcohol. She has never been a smoker or tobacco user. She denied past or present substance abuse.   Medical History:  Past Medical History:  Diagnosis Date  . Allergy   . Anxiety   . Arthritis    "left foot" (05/26/2014)  . Chronic back pain   . Chronic neck pain   . Depression   . GERD (gastroesophageal reflux disease)   . History of chicken pox   . History of colon polyps   . History of hiatal hernia   . Lower GI bleeding   . Migraines    "monthly usually; weekly lately" (05/26/2014)  . Sciatica   . Thoracic compression fracture [facility])     Current medications:  Outpatient Encounter Prescriptions as of 06/06/2016  Medication Sig  . acetaminophen (TYLENOL) 650 MG CR tablet Take 650 mg by mouth every 8 (eight) hours as needed for pain.  Marland Kitchen atorvastatin (LIPITOR) 40 MG tablet Take 1 tablet (40 mg total) by mouth daily.  . Calcium-Magnesium-Vitamin D (CALCIUM 1200+D3 PO) Take 2 capsules by mouth daily.  . citalopram (CELEXA) 10 MG tablet TAKE ONE TABLET BY MOUTH ONCE DAILY  . Ergocalciferol (DRISDOL PO) Take 3,000 Units by mouth daily.  . Ferrous Sulfate (RA IRON) 27 MG TABS Take 1 tablet by mouth as needed (low iron level).   . hydrocortisone-pramoxine (PROCTOFOAM HC) rectal foam Place 1 applicator rectally 2 (two) times daily.  Marland Kitchen loratadine (CLARITIN) 10 MG tablet Take 10 mg by mouth at  bedtime as needed (helps with sleep and headaches).  . mirtazapine (REMERON) 7.5 MG tablet TAKE ONE TABLET BY MOUTH AT BEDTIME  . omeprazole (PRILOSEC) 20 MG capsule TAKE ONE CAPSULE BY MOUTH ONCE DAILY  . pantoprazole (PROTONIX) 40 MG tablet TAKE ONE TABLET BY MOUTH DAILY  . vitamin B-12 (CYANOCOBALAMIN) 1000 MCG tablet Take 1,000 mcg by mouth daily.  . vitamin E 400 UNIT capsule Take 400 Units by mouth daily.   No facility-administered encounter medications on file as of 06/06/2016.      Current Examination:  Behavioral Observations: Appearance: Neatly and appropriately dressed and groomed Gait: Ambulated independently, no abnormalities observed Speech: Fluent, somewhat quiet and mildly slow speech Thought process: Linear, goal directed Affect: Tearful Interpersonal: Pleasant, appropriate Orientation: Oriented to all spheres, accurately named the current President and his predecessor.  Tests Administered: . Test of Premorbid Functioning (TOPF) . Wechsler Adult Intelligence Scale-Fourth Edition (WAIS-IV): Similarities, Block Design, Matrix Reasoning, Coding and Digit Span subtests . [employer] Verbal Learning Test - 2nd Edition (CVLT-2) Short Form . Repeatable Battery for the Assessment of Neuropsychological Status (RBANS) Form  A:  Figure Copy and Recall subtests, Story Memory and Recall subtests . Neuropsychological Assessment Battery (NAB) Language Module, Form 1: Naming and Bill Payment Subtests . Controlled Oral Word Association Test (COWAT) . Trail Making Test A and B . Clock drawing test . Generalized Anxiety Disorder - 7 item screener (GAD-7) . Beck Depression Inventory - Second edition (BDI-II) . Inventory of Complicated Grief (ICG)  Test Results: Note: Standardized scores are presented only for use by appropriately trained professionals and to allow for any future test-retest comparison. These scores should not be interpreted without consideration of all the information  that is contained in the rest of the report. The most recent standardization samples from the test publisher or other sources were used whenever possible to derive standard scores; scores were corrected for age, gender, ethnicity and education when available.   Test Scores:  ***  Description of Test Results:  Premorbid verbal intellectual abilities were estimated to have been within the low average range based on a test of word reading. Psychomotor processing speed was low average. Auditory attention and working memory were high average to average, respectively. Visual-spatial construction was average. Language abilities were within normal limits. Specifically, confrontation naming was average, and semantic verbal fluency was low average. Performance on a simulated bill payment task requiring multiple aspects of receptive and expressive language was average. With regard to verbal memory, encoding and acquisition of non-contextual information (i.e., word list) was borderline impaired across four learning trials. After a brief distracter task, free recall was low average. After a delay, free recall was low average. Cued recall was low average. She demonstrated good retention of previously encoded information. Performance on a yes/no recognition task was low average. On another verbal memory test, encoding and acquisition of contextual auditory information (i.e., short story) was severely impaired across two learning trials. On the first learning trial, she could recall only one piece of information from the story. After the second trial, however, she recalled about 2/3 of the content. After a delay, free recall was low average, again displaying good retention of previously encoded information. With regard to non-verbal memory, delayed free recall of visual information was low average. Executive functioning was mostly within normal limits. Mental flexibility and set-shifting were low average on Trails B. Verbal  fluency with phonemic search restrictions was borderline impaired. Verbal abstract reasoning was low average. Non-verbal abstract reasoning was low average. Performance on a clock drawing task was within normal limits. On self-report questionnaires, the patient's responses were indicative of clinically significant anxiety and depression at the present time. With regard to anxiety, she reported worrying too much about different things with inability to control worrying. She also reported nervousness, difficulty relaxing, irritability, and fear of something awful happening. With regard to depression, she endorsed moderate to severe sleep impairment, reduced appetite, fatigue, lack of libido and indecisiveness. She also endorsed mild sadness, anhedonia, guilty feelings, loss of self-confidence, self-criticalness, tearfulness, restlessness, loss of interest, feelings of worthlessness, loss of energy, concentration difficulty. She endorsed passive suicidal ideation but denied intention or plan. On a measure of grief/bereavement symptoms, the patient's reported symptoms fall within what is considered a "normal" range, in that they do not suggest "complicated" grief. She did note that memories of her deceased husband upset her, and she has felt lonely a great deal of the time since her husband passed away.   Clinical Impressions: Major depressive disorder, recurrent (current episode moderate); generalized anxiety disorder; bereavement. Neurocognitive diagnosis deferred. Results of cognitive testing were  largely within normal limits and commensurate with the patient's estimated premorbid intellectual abilities. While she demonstrated variable memory encoding, retention and recall were intact and as such I do not feel she has consolidation dysfunction. The only other area of mild impairment was phonemic verbal fluency but this finding on its own is not indicative of a neurocognitive disorder. Meanwhile, the patient  reported significant anxiety and depression. Overall, the patient's subjective cognitive complaints are most likely secondary to emotional distress and symptoms of depression/anxiety. There is no evidence of an underlying dementia at this time. I am hopeful that more aggressive treatment of her depression and anxiety will result in improved quality of life as well as enhanced cognitive function in daily life.    Recommendations/Plan: Based on the findings of the present evaluation, the following recommendations are offered:   1. Treatment for depression/anxiety: The patient is currently taking Celexa and Remeron. She may wish to see a psychiatrist to determine if changes need to be made. I highly recommend that she participate in individual counseling in combination with her psychopharmacological intervention. I will gladly refer her to [facility] if she is amenable. Grief therapy and CBT for depression will likely be effective.   2. The patient will be reassured that her cognitive test results were mostly within normal limits and not indicative of a cognitive disorder or dementia at this time. These test results will serve as a nice baseline for future comparison if needed at any time.  3. To promote brain health, the patient is encouraged to regularly participate in activities that provide safe cardiovascular exercise, mental stimulation and social interaction. Additionally, self-care is encouraged, which can also be neglected when care-giving for family members. The patient should have some time each week that she spends doing something for herself, and where she is not responsible for someone else.   Feedback to Patient: Charlene Patterson returned for a feedback appointment on 06/06/2016 to review the results of her neuropsychological evaluation with this provider. *** minutes face-to-face time was spent reviewing her test results, my impressions and my recommendations as detailed above.      Total time spent on this patient's case: [id number] unit for interview with psychologist; [phone] units of testing by psychometrician under psychologist's supervision; [phone] units for medical record review, scoring of neuropsychological tests, interpretation of test results, preparation of this report, and review of results to the patient by psychologist.      Thank you for your referral of Charlene Patterson. Please feel free to contact me if you have any questions or concerns regarding this report.

## 2016-06-06 ENCOUNTER — Encounter: Payer: Commercial Managed Care - HMO | Admitting: Psychology

## 2016-06-09 ENCOUNTER — Ambulatory Visit (INDEPENDENT_AMBULATORY_CARE_PROVIDER_SITE_OTHER): Payer: Commercial Managed Care - HMO | Admitting: Cardiovascular Disease

## 2016-06-09 ENCOUNTER — Encounter: Payer: Self-pay | Admitting: Cardiovascular Disease

## 2016-06-09 VITALS — BP 122/82 | HR 66 | Ht 63.25 in | Wt 135.4 lb

## 2016-06-09 DIAGNOSIS — I493 Ventricular premature depolarization: Secondary | ICD-10-CM | POA: Diagnosis not present

## 2016-06-09 DIAGNOSIS — R0789 Other chest pain: Secondary | ICD-10-CM

## 2016-06-09 NOTE — Progress Notes (Signed)
Cardiology Office Note   Date:  06/09/2016   ID:  Charlene Patterson, DOB 1945/09/13, MRN [id number]  PCP:  Scarlette Calico, MD  Cardiologist: Darlin Coco MD , Kabella Cassidy  Chief Complaint  Patient presents with  . Hyperlipidemia      Notes from [place]  Charlene Patterson is a 70 y.o. female who presents for follow-up office visit Charlene Patterson is a 70 y.o. female who presents for cardiology evaluation. She had been seen at the request of her internist Dr. Scarlette Calico. The patient reminds me that we saw her at [facility] cardiology about 18 years ago. At that time she had a normal stress test. Those records are not currently available. She comes in now because of concern over exertional chest tightness and dyspnea. She notes shortness of breath climbing stairs or walking up hills. She also notes a tightness in the chest. She has also had bilateral arm numbness worse in the right arm than in the left. She sometimes feels her heart skipping a beat. She can often hear her heartbeat in her right ear. Her social history reveals that she is a widow. She retired from work in 2005 so she could help look after her husband who had multiple comorbidities and finally died of high blood pressure, strokes, and end-stage renal disease and lung cancer. The patient does not smoke. She does not use alcohol. Patient does have a history of high cholesterol. Her LDL cholesterol in June 2014 was 161. She has a history of GI bleeding and was hospitalized in October or November 2015 with hematochezia attributed to diverticulosis. Social history reveals that she has a son with cerebral palsy. The patient and her son live in her old home. They're considering moving because they can no longer keep up with a large yard. At her last visit we scheduled her for a stress Myoview.  This was done on 12/24/14 and showed an ejection fraction of 55% and no ischemia.  June 09, 2016:   This is my initial visit  seeing Charlene Patterson. She is a previous patient of Dr. Mare Ferrari. She has a history of hyperlipidemia. She has a history of atypical chest pain and had a normal Myoview study last year.  No CP or dyspnea  She has not had any problems.   [employer] called and made this appt.  Keeps her great grandson - keeps her busy.   Works outside.     Past Medical History:  Diagnosis Date  . Allergy   . Anxiety   . Arthritis    "left foot" (05/26/2014)  . Chronic back pain   . Chronic neck pain   . Depression   . GERD (gastroesophageal reflux disease)   . History of chicken pox   . History of colon polyps   . History of hiatal hernia   . Lower GI bleeding   . Migraines    "monthly usually; weekly lately" (05/26/2014)  . Sciatica   . Thoracic compression fracture [facility])     Past Surgical History:  Procedure Laterality Date  . ABDOMINAL HYSTERECTOMY  ~ 1978   "partial"  . APPENDECTOMY  ~ 1978  . COLONOSCOPY  2005   last 2005  . POLYPECTOMY       Current Outpatient Prescriptions  Medication Sig Dispense Refill  . acetaminophen (TYLENOL) 650 MG CR tablet Take 650 mg by mouth every 8 (eight) hours as needed for pain.    Marland Kitchen atorvastatin (LIPITOR) 40 MG tablet Take  1 tablet (40 mg total) by mouth daily. 90 tablet 3  . Calcium-Magnesium-Vitamin D (CALCIUM 1200+D3 PO) Take 2 capsules by mouth daily.    . citalopram (CELEXA) 10 MG tablet TAKE ONE TABLET BY MOUTH ONCE DAILY 30 tablet 11  . Ergocalciferol (DRISDOL PO) Take 3,000 Units by mouth daily.    . Ferrous Sulfate (RA IRON) 27 MG TABS Take 1 tablet by mouth as needed (low iron level).     . hydrocortisone-pramoxine (PROCTOFOAM HC) rectal foam Place 1 applicator rectally 2 (two) times daily. 10 g 2  . loratadine (CLARITIN) 10 MG tablet Take 10 mg by mouth at bedtime as needed (helps with sleep and headaches).    . mirtazapine (REMERON) 7.5 MG tablet TAKE ONE TABLET BY MOUTH AT BEDTIME 30 tablet 11  . omeprazole (PRILOSEC) 20 MG capsule  TAKE ONE CAPSULE BY MOUTH ONCE DAILY 30 capsule 11  . pantoprazole (PROTONIX) 40 MG tablet TAKE ONE TABLET BY MOUTH DAILY 90 tablet 2  . vitamin B-12 (CYANOCOBALAMIN) 1000 MCG tablet Take 1,000 mcg by mouth daily.    . vitamin E 400 UNIT capsule Take 400 Units by mouth daily.     No current facility-administered medications for this visit.     Allergies:   Patient has no known allergies.    Social History:  The patient  reports that she has never smoked. She has never used smokeless tobacco. She reports that she does not drink alcohol or use drugs.   Family History:  The patient's family history includes Diabetes in her brother and sister; Heart disease in her brother, father, and mother; Prostate cancer in her father; Stomach cancer (age of onset: 12) in her sister.    ROS:  Please see the history of present illness.   Otherwise, review of systems are positive for none.   All other systems are reviewed and negative.    PHYSICAL EXAM: VS:  BP 122/82 (BP Location: Left Arm, Patient Position: Sitting, Cuff Size: Normal)   Pulse 66   Ht 5' 3.25" (1.607 m)   Wt 135 lb 6.4 oz (61.4 kg)   BMI 23.80 kg/m  , BMI Body mass index is 23.8 kg/m. GEN: Well nourished, well developed, in no acute distress  HEENT: normal  Neck: no JVD, carotid bruits, or masses Cardiac: RRR; no murmurs, rubs, or gallops,no edema  Respiratory:  clear to auscultation bilaterally, normal work of breathing GI: soft, nontender, nondistended, + BS MS: no deformity or atrophy  Skin: warm and dry, no rash Neuro:  Strength and sensation are intact Psych: euthymic mood, full affect   EKG:  EKG is ordered today. NSR at 66.   NS T wave abn.    Recent Labs: 03/14/2016: Hemoglobin 13.9; Platelets 298.0    Lipid Panel    Component Value Date/Time   CHOL 301 (H) 03/14/2016 1234   TRIG 375.0 (H) 03/14/2016 1234   HDL 42.40 03/14/2016 1234   CHOLHDL 7 03/14/2016 1234   VLDL 75.0 (H) 03/14/2016 1234   LDLDIRECT  138.0 03/14/2016 1234      Wt Readings from Last 3 Encounters:  06/09/16 135 lb 6.4 oz (61.4 kg)  04/17/16 136 lb 3 oz (61.8 kg)  02/07/16 137 lb 12 oz (62.5 kg)     ASSESSMENT AND PLAN:  1. Chest pain and exertional dyspnea improved.   Myoview stress test from last year was  normal 2. Hyperlipidemia - she has stopped her statin. Further management per Dr. Ronnald Ramp.  she will follow up with Dr. Ronnald Ramp.   She is not sure why she is here.  Will see her on an as needed basis.     Charlene Moores, MD  06/09/2016 2:56 PM    [address],  [address], [place]  [id number] Pager [phone] Phone: [phone]; Fax: [phone]

## 2016-06-09 NOTE — Patient Instructions (Signed)
Medication Instructions:  Your physician recommends that you continue on your current medications as directed. Please refer to the Current Medication list given to you today.   Labwork: none  Testing/Procedures: none  Follow-Up: Follow up with Dr. Acie Fredrickson as needed.   Any Other Special Instructions Will Be Listed Below (If Applicable).     If you need a refill on your cardiac medications before your next appointment, please call your pharmacy.

## 2016-06-13 ENCOUNTER — Ambulatory Visit (INDEPENDENT_AMBULATORY_CARE_PROVIDER_SITE_OTHER): Payer: Commercial Managed Care - HMO | Admitting: Psychology

## 2016-06-13 ENCOUNTER — Encounter: Payer: Self-pay | Admitting: Psychology

## 2016-06-13 DIAGNOSIS — R413 Other amnesia: Secondary | ICD-10-CM

## 2016-06-13 DIAGNOSIS — F339 Major depressive disorder, recurrent, unspecified: Secondary | ICD-10-CM

## 2016-06-13 NOTE — Patient Instructions (Signed)
1. Treatment for depression/anxiety: You may wish to see a psychiatrist to determine if medication changes need to be made. I highly recommend that you participate in individual counseling as well.    2. You can be reassured that your cognitive test results were mostly within normal limits and not indicative of a cognitive disorder or dementia at this time. These test results will serve as a nice baseline for future comparison if needed at any time.   3. To promote brain health, regularly participate in activities that provide safe cardiovascular exercise, mental stimulation and social interaction. Additionally, self-care is encouraged, which can often be neglected when care-giving for family members. You should have some time each week that you spend doing something for yourself, and where you are not responsible for someone else.  4. Strategies to enhance cognitive functioning in daily life are below. You also should set an alarm for medications, and write down all important appointments in one calendar that you check regularly.   Strategies to enhance cognitive functioning Attention, concentration, memory encoding and consolidation    . Make a plan and be prepared o If you find that you are more attentive at certain times of the day (i.e., the morning), plan important activities and discussions at that time o Determine which activities take the most time and which are most important, then prioritize your "to do list" based on this information o Break tasks into simpler parts, understand the steps involved before starting o Rehearse the steps mentally or write them down. If you write them down, you can use this as a checklist to check off as you complete them. o Visualize completing the task  . Use external aids  o Write everything down that you do not need to know or work with right now. Don't store extra information in your brain that you don't need right now.  o Use a calendar or  planner to make checklists, due dates and "to do" lists. o Use ONLY ONE calendar or planner and look at it frequently o Set alarms for important deadlines or appointments  . Minimize interruptions and distractions  o Find a good work environment, e.g., quiet room with a desk, close curtains, use earplugs, mask sounds with a fan or white noise machine o Turn off cell phone and/or email alerts during important tasks. In fact, it is helpful to schedule a block of time each day where you limit phone and email interruptions and focus on just the more detailed work you have. o Try to minimize the amount of background noise (i.e., television, music) when engaged in important tasks or conversations with others (note that some individuals find soft background music helpful in minimize distraction, so you may need to experiment with optimal level of noise for specific situations)  . Use active effort = consciously attending to details, closely analyzing o Failures of encoding may reflect failure to attend to one's own actions o Be prepared to work more slowly than you usually do  o When reading, allow time for re-reading sections  o Check your work for errors  . Avoid multitasking o Do not attempt to complete more than one task at one time. Focus on one task until it is completed and then move on to the next one. o Avoid other activities while engaged in important tasks, such as talking on the phone while balancing the checkbook, making a shopping list during a meeting.   . Use self-talk during tasks o Repeat the steps  of the activity to yourself as you complete them o Talk to yourself about your progress o This helps you maintain focus on the task and makes it easier to remember completing the task (Similar to "active effort" above)  . Conserve energy o Conserve energy to avoid fatigue and its effects on cognition - Get enough sleep - Pace yourself  and make sure to take breaks - Be open to  receiving help - Exercise for increased energy  . Conversational vigilance = paying attention during a conversation  o Listen actively: focus on the speaker and position yourself so that you can clearly hear the him/her, and have open/relaxed posture  o Eye contact: Maintaining eye contact with the person you are speaking with may increase the likelihood that important information is properly received  o Ask questions: Ask questions for clarification (e.g., request that the speaker explain something in a different way) or ask for information to be repeated if you become distracted, or if you do not hear or understand something during a conversation  o Paraphrase: Summarize or repeat back important information from a conversation in your own words to facilitate communication and ensure that you have heard correctly and understand

## 2016-06-13 NOTE — Progress Notes (Signed)
NEUROPSYCHOLOGICAL EVALUATION     Name:                                      Charlene Patterson             Date of Birth:                           1945-09-27 Date of Interview:                    05/18/2016 Date of Testing:                      05/29/2016                   Date of Feedback:                  06/13/2016                                     Background Information:   Reason for Referral:  Charlene Patterson is a 70 y.o., right-handed female referred by Dr. Ellouise Newer to assess her current level of cognitive functioning and assist in differential diagnosis. The current evaluation consisted of a review of available medical records, an interview with the patient, and the completion of a neuropsychological testing battery. Informed consent was obtained.   History of Presenting Problem:  Charlene Patterson reported gradual onset of memory difficulties with worsening over time. She is unsure where she first started noticing memory difficulty but she saw Dr. Delice Lesch for initial consultation in 06/2015. She scored 30/30 on the MMSE at that appointment. She had a brain MRI on 07/01/2015 and according to the report this was normal for age.    The patient reported that she frequently forgets things she is supposed to do. She regularly forgets to take her medications. She lives with her grandson and his family, and they tell her that she repeats questions and forgets recent conversations they've had.    Upon direct questioning, the patient also endorsed frequently misplacing/losing items, forgetting appointments or other obligations, difficulty concentrating, starting but not finishing tasks, distractibility, slowed processing, word finding difficulty, and more difficulty with navigation when driving. She reported there are times when she is driving and does not know where she is or where she is going, but she "figures it out" relatively quickly. She did note that she makes more wrong turns when  driving. She mainly drives to familiar and local locations.   The patient continues to manage all instrumental ADLs, including driving, appointments, finances, medications and some cooking. She reported some difficulty managing her bills. She has not been doing much cooking since moving in with her grandson's family in June of this year. In the home is herself, her son with cerebral palsy (he has lived with her his entire life), her grandson, grandson's wife, and two great-grandchildren (69yo and 75 mo old). The patient provides a lot of the care for her 56-month-old great-grandson. She is very busy with this and while she enjoys it, it can be stressful.   Physically, the patient complains of arthritic pain and migraines. She also reported significant fatigue. She has difficulty sleeping due to frequent awakening. She also  reported poor appetite. She denied difficulty with balance or walking. She has not had any falls.   Current mood is depressed. She reports she is more irritable and less patient. She first reported that her depression started while her husband was sick. She cared for him for seven years before he passed away in 10/29/2010. However, later, she admitted that she had depression many years ago and has a remote history of one suicide attempt. She overdosed on medications and had to be taken to the ER to have her stomach pumped. She has only shared this with her husband and one doctor. She has never had treatment from a mental health provider. Grief counseling has been offered in the past but she declined, feeling that she could handle it on her own. She started taking psychotropic medication after her husband passed. She currently takes Celexa and Remeron. Upon direct questioning, she admitted that she experiences possible auditory hallucinations. She frequently hears noises and voices that others cannot hear. She says this was worse in her old home but it does still occur in her grandson's home. She  reported that it is not bothersome or upsetting to her, however. She denied any visual hallucinations. She admits to wishing she were dead and passive suicidal ideation. However, she denies intention or planning, stating that she knows she would go to hell if she took her life. She reported, "God is what keeps me going."     Social History: Charlene Patterson was born and raised in [place]. 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 However, she did graduate from high school. 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 She was married to her husband for 44 years. He passed away in 10-29-2010. She has two adult children (son and daughter). She does not consume alcohol. She has never been a smoker or tobacco user. She denied past or present substance abuse.     Medical History:      Past Medical History:  Diagnosis Date  . Allergy    . Anxiety    . Arthritis      "left foot" (05/26/2014)  . Chronic back pain    . Chronic neck pain    . Depression    . GERD (gastroesophageal reflux disease)    . History of chicken pox    . History of colon polyps    . History of hiatal hernia    . Lower GI bleeding    . Migraines      "monthly usually; weekly lately" (05/26/2014)  . Sciatica    . Thoracic compression fracture [facility])        Current medications:      Outpatient Encounter Prescriptions as of 06/06/2016  Medication Sig  . acetaminophen (TYLENOL) 650 MG CR tablet Take 650 mg by mouth every 8 (eight) hours as needed for pain.  Marland Kitchen atorvastatin (LIPITOR) 40 MG tablet Take 1 tablet (40 mg total) by mouth daily.  . Calcium-Magnesium-Vitamin D (CALCIUM 1200+D3 PO) Take 2 capsules by mouth daily.  . citalopram (CELEXA) 10 MG tablet TAKE ONE TABLET BY MOUTH ONCE DAILY  . Ergocalciferol (DRISDOL PO) Take 3,000 Units by mouth daily.  . Ferrous Sulfate (RA IRON) 27 MG TABS Take 1  tablet by mouth as needed (low iron level).   . hydrocortisone-pramoxine (PROCTOFOAM HC) rectal foam Place 1  applicator rectally 2 (two) times daily.  Marland Kitchen loratadine (CLARITIN) 10 MG tablet Take 10 mg by mouth at bedtime as needed (helps with sleep and headaches).  . mirtazapine (REMERON) 7.5 MG tablet TAKE ONE TABLET BY MOUTH AT BEDTIME  . omeprazole (PRILOSEC) 20 MG capsule TAKE ONE CAPSULE BY MOUTH ONCE DAILY  . pantoprazole (PROTONIX) 40 MG tablet TAKE ONE TABLET BY MOUTH DAILY  . vitamin B-12 (CYANOCOBALAMIN) 1000 MCG tablet Take 1,000 mcg by mouth daily.  . vitamin E 400 UNIT capsule Take 400 Units by mouth daily.    No facility-administered encounter medications on file as of 06/06/2016.         Current Examination:   Behavioral Observations: Appearance: Neatly and appropriately dressed and groomed Gait: Ambulated independently, no abnormalities observed Speech: Fluent, somewhat quiet and mildly slow speech Thought process: Linear, goal directed Affect: Tearful Interpersonal: Pleasant, appropriate Orientation: Oriented to all spheres, accurately named the current President and his predecessor.   Tests Administered:  Test of Premorbid Functioning (TOPF)  Wechsler Adult Intelligence Scale-Fourth Edition (WAIS-IV): Similarities, Block Design, Matrix Reasoning, Coding and Digit Span subtests  [place]consin Verbal Learning Test - 2nd Edition (CVLT-2) Short Form  Repeatable Battery for the Assessment of Neuropsychological Status (RBANS) Form A:  Figure Copy and Recall subtests, Story Memory and Recall subtests  Neuropsychological Assessment Battery (NAB) Language Module, Form 1: Naming and Bill Payment Subtests  Controlled Oral Word Association Test (COWAT)  Trail Making Test A and B  Clock drawing test  Generalized Anxiety Disorder - 7 item screener (GAD-7)  Beck Depression Inventory - Second edition (BDI-II)  Inventory of Complicated Grief (ICG)   Test Results: Note:  Standardized scores are presented only for use by appropriately trained professionals and to allow for any future test-retest comparison. These scores should not be interpreted without consideration of all the information that is contained in the rest of the report. The most recent standardization samples from the test publisher or other sources were used whenever possible to derive standard scores; scores were corrected for age, gender, ethnicity and education when available.    Test Scores:   Test Name Raw Score Standardized Score Descriptor  TOPF 20/70 SS= 80 Low average  WAIS-IV Subtests     Similarities 18/36 ss= 7 Low average  Block Design 28/66 ss= 9 Average  Matrix Reasoning 8/26 ss= 7 Low average  Coding 37/135 ss= 7 Low average  Digit Span Forward 12/16 ss= 13 High average  Digit Span Backward 6/16 ss= 8 Low average  RBANS Subtests     Figure Copy 18/20 Z= -0.1 Average  Figure Recall 9/20 Z= -1.2 Low average  Story Memory 9/24 Z= -2.7 Severely impaired  Story Recall 7/12 Z= -1.1 Low average  CVLT-II Scores     Trial 1 4/9 Z= -1 Low average  Trial 4 5/9 Z= -2.5 Impaired  Trials 1-4 total 21/36 T= 36 Borderline  SD Free Recall 6/9 Z= -1 Low average  LD Free Recall 5/9 Z= -1 Low average  LD Cued Recall 6/9 Z= -1 Low average  Recognition Discriminability 6/6 hits, 0 false positives Z= -1 Low average  Forced Choice Recognition 9/9  WNL  NAB Language Subtests     Naming 29/31 T= 46 Average  Bill Payment 18/19 T= 55 Average  COWAT-FAS 18 T=  36 Borderline  COWAT-Animals 11 T=  37 Low average  Trail Making Test A  39" 0 errors T= 47 Average  Trail Making Test B  117" 0 errors  T= 41 Low average  Clock Drawing   WNL   GAD-7  15/21 Severe   BDI-II  24/63 Moderate  ICG  22/76 Below cutoff       Description of Test Results:   Premorbid verbal intellectual abilities were estimated to have been within the low average range based on a test of word reading. Psychomotor  processing speed was low average. Auditory attention and working memory were high average to average, respectively. Visual-spatial construction was average. Language abilities were within normal limits. Specifically, confrontation naming was average, and semantic verbal fluency was low average. Performance on a simulated bill payment task requiring multiple aspects of receptive and expressive language was average. With regard to verbal memory, encoding and acquisition of non-contextual information (i.e., word list) was borderline impaired across four learning trials. After a brief distracter task, free recall was low average. After a delay, free recall was low average. Cued recall was low average. She demonstrated good retention of previously encoded information. Performance on a yes/no recognition task was low average. On another verbal memory test, encoding and acquisition of contextual auditory information (i.e., short story) was severely impaired across two learning trials. On the first learning trial, she could recall only one piece of information from the story. After the second trial, however, she recalled about 2/3 of the content. After a delay, free recall was low average, again displaying good retention of previously encoded information. With regard to non-verbal memory, delayed free recall of visual information was low average. Executive functioning was mostly within normal limits. Mental flexibility and set-shifting were low average on Trails B. Verbal fluency with phonemic search restrictions was borderline impaired. Verbal abstract reasoning was low average. Non-verbal abstract reasoning was low average. Performance on a clock drawing task was within normal limits. On self-report questionnaires, the patient's responses were indicative of clinically significant anxiety and depression at the present time. With regard to anxiety, she reported worrying too much about different things with inability to  control worrying. She also reported nervousness, difficulty relaxing, irritability, and fear of something awful happening. With regard to depression, she endorsed moderate to severe sleep impairment, reduced appetite, fatigue, lack of libido and indecisiveness. She also endorsed mild sadness, anhedonia, guilty feelings, loss of self-confidence, self-criticalness, tearfulness, restlessness, loss of interest, feelings of worthlessness, loss of energy, concentration difficulty. She endorsed passive suicidal ideation but denied intention or plan. On a measure of grief/bereavement symptoms, the patient's reported symptoms fall within what is considered a "normal" range, in that they do not suggest "complicated" grief. She did note that memories of her deceased husband upset her, and she has felt lonely a great deal of the time since her husband passed away.     Clinical Impressions: Major depressive disorder, recurrent (current episode moderate); generalized anxiety disorder; bereavement. Neurocognitive diagnosis deferred. Results of cognitive testing were largely within normal limits and commensurate with the patient's estimated premorbid intellectual abilities. While she demonstrated variable memory encoding, retention and recall were intact and as such I do not feel she has consolidation dysfunction. The only other area of mild impairment was phonemic verbal fluency but this finding on its own is not indicative of a neurocognitive disorder. Meanwhile, the patient reported significant anxiety and depression. Overall, the patient's subjective cognitive complaints are most likely secondary to emotional distress and symptoms of depression/anxiety. She has a significant amount of stress in her life right now. Fortunately, there is no evidence of an underlying dementia at this time. I am hopeful that  more aggressive treatment of her depression and anxiety will result in improved quality of life as well as enhanced  cognitive function in daily life.    Recommendations/Plan: Based on the findings of the present evaluation, the following recommendations are offered:   1. Treatment for depression/anxiety: The patient is currently taking Celexa and Remeron. She may wish to see a psychiatrist to determine if changes need to be made. I highly recommend that she participate in individual counseling in combination with her psychopharmacological intervention. I will gladly refer her to [facility] if she is amenable. Grief therapy and CBT for depression will likely be effective.   2. The patient will be reassured that her cognitive test results were mostly within normal limits and not indicative of a cognitive disorder or dementia at this time. These test results will serve as a nice baseline for future comparison if needed at any time.   3. To promote brain health, the patient is encouraged to regularly participate in activities that provide safe cardiovascular exercise, mental stimulation and social interaction. Additionally, self-care is encouraged, which can often be neglected when care-giving for family members. The patient should have some time each week that she spends doing something for herself, and where she is not responsible for someone else.  4. Strategies to enhance cognitive functioning in daily life will also be reviewed with the patient. She should set an alarm for her medications, and she should write down all important appointments in one calendar that she checks regularly.     Feedback to Patient: Charlene Patterson returned for a feedback appointment on 06/13/2016 to review the results of her neuropsychological evaluation with this provider. 15 minutes face-to-face time was spent reviewing her test results, my impressions and my recommendations as detailed above.      Total time spent on this patient's case: [id number] unit for interview with psychologist; [phone] units of testing by  psychometrician under psychologist's supervision; [phone] units for medical record review, scoring of neuropsychological tests, interpretation of test results, preparation of this report, and review of results to the patient by psychologist.         Thank you for your referral of Charlene Patterson. Please feel free to contact me if you have any questions or concerns regarding this report.

## 2016-06-19 ENCOUNTER — Other Ambulatory Visit: Payer: Self-pay | Admitting: Internal Medicine

## 2016-07-20 ENCOUNTER — Ambulatory Visit: Payer: Commercial Managed Care - HMO | Admitting: Neurology

## 2016-08-21 ENCOUNTER — Other Ambulatory Visit: Payer: Self-pay | Admitting: Internal Medicine

## 2016-08-21 DIAGNOSIS — Z1231 Encounter for screening mammogram for malignant neoplasm of breast: Secondary | ICD-10-CM

## 2016-09-04 ENCOUNTER — Ambulatory Visit
Admission: RE | Admit: 2016-09-04 | Discharge: 2016-09-04 | Disposition: A | Discharge status: Home / Self Care | Payer: Medicare HMO | Source: Ambulatory Visit | Attending: Internal Medicine | Admitting: Internal Medicine

## 2016-09-04 DIAGNOSIS — Z1231 Encounter for screening mammogram for malignant neoplasm of breast: Secondary | ICD-10-CM | POA: Diagnosis not present

## 2016-09-04 LAB — HM MAMMOGRAPHY

## 2016-09-18 ENCOUNTER — Ambulatory Visit: Payer: Commercial Managed Care - HMO | Admitting: Neurology

## 2016-09-27 ENCOUNTER — Telehealth: Payer: Self-pay | Admitting: *Deleted

## 2016-09-27 DIAGNOSIS — E785 Hyperlipidemia, unspecified: Secondary | ICD-10-CM

## 2016-09-27 MED ORDER — ATORVASTATIN CALCIUM 40 MG PO TABS: 40.0000 mg | ORAL_TABLET | Freq: Every day | ORAL | 0 refills | Status: DC

## 2016-09-27 MED ORDER — PANTOPRAZOLE SODIUM 40 MG PO TBEC: 40.0000 mg | DELAYED_RELEASE_TABLET | Freq: Every day | ORAL | 0 refills | Status: DC

## 2016-09-27 NOTE — Telephone Encounter (Signed)
Pt left msg on triage stating needing a rx sent to local pharmacy until she receive order from [facility]. Called pt no answer LMOM needing to know which medication she is needing along w/local pharmacy she is using...[facility]

## 2016-09-27 NOTE — Telephone Encounter (Signed)
Pt return call back she states she is needing a 2 week supply on her atorvastatin and pantoprazole until [facility] sends her medications. Verified pharmacy sent scripts to [place]...[facility]ss

## 2016-10-03 ENCOUNTER — Other Ambulatory Visit: Payer: Self-pay | Admitting: Internal Medicine

## 2016-10-03 ENCOUNTER — Ambulatory Visit (INDEPENDENT_AMBULATORY_CARE_PROVIDER_SITE_OTHER): Payer: Medicare HMO | Admitting: Internal Medicine

## 2016-10-03 ENCOUNTER — Ambulatory Visit (INDEPENDENT_AMBULATORY_CARE_PROVIDER_SITE_OTHER)
Admission: RE | Admit: 2016-10-03 | Discharge: 2016-10-03 | Disposition: A | Discharge status: Home / Self Care | Payer: Medicare HMO | Source: Ambulatory Visit | Attending: Internal Medicine | Admitting: Internal Medicine

## 2016-10-03 VITALS — BP 122/76 | HR 83 | Temp 98.2°F | Resp 16 | Ht 63.25 in | Wt 148.0 lb

## 2016-10-03 DIAGNOSIS — M5440 Lumbago with sciatica, unspecified side: Secondary | ICD-10-CM | POA: Diagnosis not present

## 2016-10-03 DIAGNOSIS — G8929 Other chronic pain: Secondary | ICD-10-CM

## 2016-10-03 DIAGNOSIS — M545 Low back pain: Secondary | ICD-10-CM | POA: Diagnosis not present

## 2016-10-03 DIAGNOSIS — S22000D Wedge compression fracture of unspecified thoracic vertebra, subsequent encounter for fracture with routine healing: Secondary | ICD-10-CM | POA: Diagnosis not present

## 2016-10-03 DIAGNOSIS — M5442 Lumbago with sciatica, left side: Secondary | ICD-10-CM

## 2016-10-03 DIAGNOSIS — M25552 Pain in left hip: Secondary | ICD-10-CM

## 2016-10-03 DIAGNOSIS — M25551 Pain in right hip: Secondary | ICD-10-CM | POA: Diagnosis not present

## 2016-10-03 MED ORDER — TAPENTADOL HCL 75 MG PO TABS: 75.0000 mg | ORAL_TABLET | Freq: Four times a day (QID) | ORAL | 0 refills | Status: DC | PRN

## 2016-10-03 NOTE — Patient Instructions (Signed)

## 2016-10-03 NOTE — Progress Notes (Signed)
Subjective:  Patient ID: Charlene Patterson, female    DOB: July 17, 1945  Age: 71 y.o. MRN: [id number]  CC: Back Pain   HPI Charlene Patterson presents for recurrent and worsening LLB pain over the last few months that radiates into her left hip. She describes the pain as a crampy pain that worsens with bending and lifting. She complains of N/T in her right foot but no paresthesias in her LLE.  Outpatient Medications Prior to Visit  Medication Sig Dispense Refill  . acetaminophen (TYLENOL) 650 MG CR tablet Take 650 mg by mouth every 8 (eight) hours as needed for pain.    Marland Kitchen atorvastatin (LIPITOR) 40 MG tablet Take 1 tablet (40 mg total) by mouth daily. 14 tablet 0  . Calcium-Magnesium-Vitamin D (CALCIUM 1200+D3 PO) Take 2 capsules by mouth daily.    . citalopram (CELEXA) 10 MG tablet TAKE ONE TABLET BY MOUTH ONCE DAILY 30 tablet 11  . Ergocalciferol (DRISDOL PO) Take 3,000 Units by mouth daily.    . Ferrous Sulfate (RA IRON) 27 MG TABS Take 1 tablet by mouth as needed (low iron level).     . hydrocortisone-pramoxine (PROCTOFOAM HC) rectal foam Place 1 applicator rectally 2 (two) times daily. 10 g 2  . loratadine (CLARITIN) 10 MG tablet Take 10 mg by mouth at bedtime as needed (helps with sleep and headaches).    . mirtazapine (REMERON) 7.5 MG tablet TAKE ONE TABLET BY MOUTH AT BEDTIME 30 tablet 11  . pantoprazole (PROTONIX) 40 MG tablet Take 1 tablet (40 mg total) by mouth daily. 14 tablet 0  . vitamin B-12 (CYANOCOBALAMIN) 1000 MCG tablet Take 1,000 mcg by mouth daily.    . vitamin E 400 UNIT capsule Take 400 Units by mouth daily.    Marland Kitchen omeprazole (PRILOSEC) 20 MG capsule TAKE ONE CAPSULE BY MOUTH ONCE DAILY 30 capsule 11   No facility-administered medications prior to visit.     ROS Review of Systems  Constitutional: Positive for unexpected weight change (wt gain). Negative for activity change, appetite change, chills, diaphoresis and fatigue.  HENT: Negative.   Eyes: Negative for visual  disturbance.  Respiratory: Negative for cough, chest tightness, shortness of breath and stridor.   Cardiovascular: Negative for chest pain, palpitations and leg swelling.  Gastrointestinal: Negative for abdominal pain, constipation, diarrhea, nausea and vomiting.  Endocrine: Negative.   Genitourinary: Negative.  Negative for difficulty urinating.  Musculoskeletal: Positive for arthralgias and back pain. Negative for myalgias and neck pain.  Skin: Negative.   Allergic/Immunologic: Negative.   Neurological: Positive for numbness. Negative for dizziness, tremors and weakness.  Hematological: Negative.  Negative for adenopathy. Does not bruise/bleed easily.  Psychiatric/Behavioral: Negative.     Objective:  BP 122/76 (BP Location: Left Arm, Patient Position: Sitting, Cuff Size: Normal)   Pulse 83   Temp 98.2 F (36.8 C) (Oral)   Ht 5' 3.25" (1.607 m)   Wt 148 lb (67.1 kg)   SpO2 96%   BMI 26.01 kg/m   BP Readings from Last 3 Encounters:  10/03/16 122/76  06/09/16 122/82  04/17/16 128/74    Wt Readings from Last 3 Encounters:  10/03/16 148 lb (67.1 kg)  06/09/16 135 lb 6.4 oz (61.4 kg)  04/17/16 136 lb 3 oz (61.8 kg)    Physical Exam  Constitutional: She is oriented to person, place, and time. No distress.  HENT:  Mouth/Throat: Oropharynx is clear and moist. No oropharyngeal exudate.  Eyes: Conjunctivae are normal. Right eye exhibits no discharge.  Left eye exhibits no discharge. No scleral icterus.  Neck: Normal range of motion. Neck supple. No JVD present. No tracheal deviation present. No thyromegaly present.  Cardiovascular: Normal rate, regular rhythm, normal heart sounds and intact distal pulses.  Exam reveals no gallop and no friction rub.   No murmur heard. Pulmonary/Chest: Effort normal and breath sounds normal. No stridor. No respiratory distress. She has no wheezes. She has no rales. She exhibits no tenderness.  Abdominal: Soft. Bowel sounds are normal. She exhibits  no distension and no mass. There is no tenderness. There is no rebound and no guarding.  Musculoskeletal: Normal range of motion. She exhibits no edema, tenderness or deformity.       Left hip: Normal. She exhibits normal range of motion, normal strength, no tenderness, no bony tenderness, no swelling, no crepitus and no deformity.  Lymphadenopathy:    She has no cervical adenopathy.  Neurological: She is alert and oriented to person, place, and time. She displays abnormal reflex. No cranial nerve deficit. She exhibits normal muscle tone. Coordination normal.  Reflex Scores:      Tricep reflexes are 2+ on the right side and 2+ on the left side.      Bicep reflexes are 2+ on the right side.      Brachioradialis reflexes are 1+ on the right side and 1+ on the left side.      Patellar reflexes are 0 on the right side and 0 on the left side.      Achilles reflexes are 0 on the right side and 0 on the left side. NEG SLR in BLE  Skin: Skin is warm and dry. No rash noted. She is not diaphoretic. No erythema. No pallor.  Vitals reviewed.   Lab Results  Component Value Date   WBC 4.6 03/14/2016   HGB 13.9 03/14/2016   HCT 40.5 03/14/2016   PLT 298.0 03/14/2016   GLUCOSE 84 05/11/2015   CHOL 301 (H) 03/14/2016   TRIG 375.0 (H) 03/14/2016   HDL 42.40 03/14/2016   LDLDIRECT 138.0 03/14/2016   ALT 11 05/11/2015   AST 14 05/11/2015   NA 141 05/11/2015   K 3.9 05/11/2015   CL 106 05/11/2015   CREATININE 0.90 05/11/2015   BUN 15 05/11/2015   CO2 27 05/11/2015   TSH 1.43 05/11/2015    Mm Digital Screening Bilateral  Result Date: 09/04/2016 CLINICAL DATA:  Screening. EXAM: DIGITAL SCREENING BILATERAL MAMMOGRAM WITH CAD COMPARISON:  Previous exam(s). ACR Breast Density Category b: There are scattered areas of fibroglandular density. FINDINGS: There are no findings suspicious for malignancy. Images were processed with CAD. IMPRESSION: No mammographic evidence of malignancy. A result letter of  this screening mammogram will be mailed directly to the patient. RECOMMENDATION: Screening mammogram in one year. (Code:SM-B-01Y) BI-RADS CATEGORY  1: Negative. Electronically Signed   By: Margarette Canada M.D.   On: 09/04/2016 15:57    Assessment & Plan:   Danyle was seen today for back pain.  Diagnoses and all orders for this visit:  Chronic left-sided low back pain with sciatica, sciatica laterality unspecified- Her exam is negative for any radicular findings but her plain film shows spurring on the endplates of asked her to undergo an MRI of her lower spine to screen for spinal stenosis, nerve impingement, tumor -     DG Lumbar Spine Complete; Future -     tapentadol HCl (NUCYNTA) 75 MG tablet; Take 1 tablet (75 mg total) by mouth every 6 (six) hours  as needed. -     MR LUMBAR SPINE WO CONTRAST; Future  Chronic left-sided low back pain with left-sided sciatica- will control the pain with Nucynta -     DG Lumbar Spine Complete; Future -     tapentadol HCl (NUCYNTA) 75 MG tablet; Take 1 tablet (75 mg total) by mouth every 6 (six) hours as needed. -     MR LUMBAR SPINE WO CONTRAST; Future  Chronic pain of right hip -     tapentadol HCl (NUCYNTA) 75 MG tablet; Take 1 tablet (75 mg total) by mouth every 6 (six) hours as needed.  Closed compression fracture of thoracic vertebra with routine healing, subsequent encounter -     DG Lumbar Spine Complete; Future -     tapentadol HCl (NUCYNTA) 75 MG tablet; Take 1 tablet (75 mg total) by mouth every 6 (six) hours as needed. -     MR LUMBAR SPINE WO CONTRAST; Future  Chronic hip pain, left -     Cancel: DG HIP UNILAT WITH PELVIS 1V LEFT; Future -     tapentadol HCl (NUCYNTA) 75 MG tablet; Take 1 tablet (75 mg total) by mouth every 6 (six) hours as needed.   I have discontinued Ms. Vallery's omeprazole. I am also having her start on tapentadol HCl. Additionally, I am having her maintain her Ferrous Sulfate, Calcium-Magnesium-Vitamin D (CALCIUM  1200+D3 PO), acetaminophen, vitamin B-12, vitamin E, loratadine, Ergocalciferol (DRISDOL PO), hydrocortisone-pramoxine, citalopram, mirtazapine, pantoprazole, and atorvastatin.  Meds ordered this encounter  Medications  . tapentadol HCl (NUCYNTA) 75 MG tablet    Sig: Take 1 tablet (75 mg total) by mouth every 6 (six) hours as needed.    Dispense:  75 tablet    Refill:  0     Follow-up: Return in about 4 weeks (around 10/31/2016).  Scarlette Calico, MD

## 2016-10-03 NOTE — Progress Notes (Signed)
Pre visit review using our clinic review tool, if applicable. No additional management support is needed unless otherwise documented below in the visit note. 

## 2016-10-04 ENCOUNTER — Encounter: Payer: Self-pay | Admitting: Internal Medicine

## 2016-10-05 ENCOUNTER — Other Ambulatory Visit: Payer: Self-pay | Admitting: Internal Medicine

## 2016-10-05 ENCOUNTER — Telehealth: Payer: Self-pay

## 2016-10-05 DIAGNOSIS — M5442 Lumbago with sciatica, left side: Secondary | ICD-10-CM

## 2016-10-05 DIAGNOSIS — M5412 Radiculopathy, cervical region: Secondary | ICD-10-CM

## 2016-10-05 DIAGNOSIS — G8929 Other chronic pain: Secondary | ICD-10-CM

## 2016-10-05 DIAGNOSIS — M5440 Lumbago with sciatica, unspecified side: Principal | ICD-10-CM

## 2016-10-05 MED ORDER — OXYCODONE HCL 5 MG PO TABS: 5.0000 mg | ORAL_TABLET | Freq: Four times a day (QID) | ORAL | 0 refills | Status: DC | PRN

## 2016-10-05 NOTE — Telephone Encounter (Signed)
RX written 

## 2016-10-05 NOTE — Telephone Encounter (Signed)
[employer] would not cover the nucynta 75 mg.   Alternatives are: oxycodone 10 mg, morphine 15 mg and hydromorphone 2 mg  Please advise

## 2016-10-06 ENCOUNTER — Other Ambulatory Visit: Payer: Self-pay | Admitting: Internal Medicine

## 2016-10-06 DIAGNOSIS — E785 Hyperlipidemia, unspecified: Secondary | ICD-10-CM

## 2016-10-06 DIAGNOSIS — F329 Major depressive disorder, single episode, unspecified: Secondary | ICD-10-CM

## 2016-10-06 DIAGNOSIS — K219 Gastro-esophageal reflux disease without esophagitis: Secondary | ICD-10-CM

## 2016-10-06 DIAGNOSIS — F32A Depression, unspecified: Secondary | ICD-10-CM

## 2016-10-06 DIAGNOSIS — F450 Somatization disorder: Secondary | ICD-10-CM

## 2016-10-06 MED ORDER — PANTOPRAZOLE SODIUM 40 MG PO TBEC: 40.0000 mg | DELAYED_RELEASE_TABLET | Freq: Every day | ORAL | 3 refills | Status: DC

## 2016-10-06 MED ORDER — CITALOPRAM HYDROBROMIDE 10 MG PO TABS: 10.0000 mg | ORAL_TABLET | Freq: Every day | ORAL | 3 refills | Status: DC

## 2016-10-06 MED ORDER — MIRTAZAPINE 7.5 MG PO TABS: 7.5000 mg | ORAL_TABLET | Freq: Every day | ORAL | 3 refills | Status: DC

## 2016-10-06 MED ORDER — ATORVASTATIN CALCIUM 40 MG PO TABS: 40.0000 mg | ORAL_TABLET | Freq: Every day | ORAL | 3 refills | Status: DC

## 2016-10-06 NOTE — Telephone Encounter (Signed)
Pt is aware rx is ready for pick up at the front desk. 

## 2016-10-12 ENCOUNTER — Ambulatory Visit
Admission: RE | Admit: 2016-10-12 | Discharge: 2016-10-12 | Disposition: A | Discharge status: Home / Self Care | Payer: Medicare HMO | Source: Ambulatory Visit | Attending: Internal Medicine | Admitting: Internal Medicine

## 2016-10-12 DIAGNOSIS — G8929 Other chronic pain: Secondary | ICD-10-CM

## 2016-10-12 DIAGNOSIS — M48061 Spinal stenosis, lumbar region without neurogenic claudication: Secondary | ICD-10-CM | POA: Diagnosis not present

## 2016-10-12 DIAGNOSIS — M5440 Lumbago with sciatica, unspecified side: Principal | ICD-10-CM

## 2016-10-12 DIAGNOSIS — S22000D Wedge compression fracture of unspecified thoracic vertebra, subsequent encounter for fracture with routine healing: Secondary | ICD-10-CM

## 2016-10-12 DIAGNOSIS — M5442 Lumbago with sciatica, left side: Secondary | ICD-10-CM

## 2016-10-13 ENCOUNTER — Telehealth: Payer: Self-pay | Admitting: Internal Medicine

## 2016-10-13 ENCOUNTER — Other Ambulatory Visit: Payer: Self-pay | Admitting: Internal Medicine

## 2016-10-13 DIAGNOSIS — M5416 Radiculopathy, lumbar region: Principal | ICD-10-CM

## 2016-10-13 DIAGNOSIS — M48061 Spinal stenosis, lumbar region without neurogenic claudication: Secondary | ICD-10-CM

## 2016-10-13 NOTE — Telephone Encounter (Signed)
Pt called back. Please call back. °

## 2016-10-13 NOTE — Telephone Encounter (Signed)
Pt informed of results.

## 2016-10-13 NOTE — Telephone Encounter (Signed)
States that Stef called her today with MRI results but she was driving and could not process what she was being told.  I did give patient MD response.  Patient states that Stef gave her additional information too.  She is requesting a call back with this information.

## 2016-10-13 NOTE — Telephone Encounter (Signed)
LVM for pt to call back as soon as possible.   

## 2016-11-03 ENCOUNTER — Telehealth: Payer: Self-pay | Admitting: *Deleted

## 2016-11-03 NOTE — Telephone Encounter (Signed)
Pt to be removed from prolia portal 

## 2016-11-03 NOTE — Telephone Encounter (Signed)
Megan, I keep getting these messages >> should I route them to you?

## 2016-11-03 NOTE — Telephone Encounter (Signed)
On Friday, Jul 28, 2016, we sent notice to you, requesting verification of continued prolia injections for this pt. As of today, we have not received a response and want to verify that prolia continues to be a recommended treatment for this pt. At this time, the patient is six (6) months to one (1) year behind in receiving the prolia injection. Please reply to this message by Wednesday, Nov 08, 2016 at 5pm so that we may take the necessary steps to ensure continued care for your patient. Thank you  

## 2016-11-03 NOTE — Telephone Encounter (Signed)
I don't think this patient had any Prolia injections here. I also have not seen her in 2 years.

## 2016-11-07 NOTE — Telephone Encounter (Signed)
Patient has called back in stating that she was checking on referral.  In notes states she was to be referred to a "back specialist".  I do see a referral to Pain Management which takes about 3 months.  Can you please follow up on this to see if she is to be referred anywhere else?

## 2016-11-08 NOTE — Telephone Encounter (Signed)
Spoke to pt and informed that it could take several months to see pain management and that they would call her directly to schedule.

## 2016-11-16 ENCOUNTER — Ambulatory Visit (HOSPITAL_BASED_OUTPATIENT_CLINIC_OR_DEPARTMENT_OTHER): Payer: Medicare HMO | Admitting: Physical Medicine & Rehabilitation

## 2016-11-16 ENCOUNTER — Encounter: Payer: Medicare HMO | Attending: Physical Medicine & Rehabilitation

## 2016-11-16 ENCOUNTER — Encounter: Payer: Self-pay | Admitting: Physical Medicine & Rehabilitation

## 2016-11-16 VITALS — BP 124/68 | HR 79 | Resp 14

## 2016-11-16 DIAGNOSIS — Z5181 Encounter for therapeutic drug level monitoring: Secondary | ICD-10-CM | POA: Diagnosis not present

## 2016-11-16 DIAGNOSIS — G8929 Other chronic pain: Secondary | ICD-10-CM | POA: Insufficient documentation

## 2016-11-16 DIAGNOSIS — G5601 Carpal tunnel syndrome, right upper limb: Secondary | ICD-10-CM | POA: Insufficient documentation

## 2016-11-16 DIAGNOSIS — M79605 Pain in left leg: Secondary | ICD-10-CM | POA: Diagnosis not present

## 2016-11-16 DIAGNOSIS — M48061 Spinal stenosis, lumbar region without neurogenic claudication: Secondary | ICD-10-CM | POA: Diagnosis not present

## 2016-11-16 DIAGNOSIS — M47896 Other spondylosis, lumbar region: Secondary | ICD-10-CM | POA: Insufficient documentation

## 2016-11-16 DIAGNOSIS — Z79899 Other long term (current) drug therapy: Secondary | ICD-10-CM | POA: Diagnosis not present

## 2016-11-16 DIAGNOSIS — M545 Low back pain, unspecified: Secondary | ICD-10-CM

## 2016-11-16 MED ORDER — TRAMADOL HCL 50 MG PO TABS: 50.0000 mg | ORAL_TABLET | Freq: Every evening | ORAL | 1 refills | Status: DC | PRN

## 2016-11-16 NOTE — Patient Instructions (Signed)
Wear right wrist splint at night  PT evaluation  Tramadol in place of oxycodone

## 2016-11-16 NOTE — Progress Notes (Signed)
Subjective:    Patient ID: Charlene Patterson, female    DOB: March 10, 1946, 71 y.o.   MRN: [id number]  HPI CC:  Low back and LLE pain  Onset ~13 yrs ago when she fell off roof.  Pain has increased over the last couple years.  Bilateral foot pain onset several years ago, pain is described as burning in the feet. She denies history of diabetes, but states he has a strong family history for diabetes.  Her left lower extremity pain is primarily in the thigh area.  Review of lab work indicates HLD and hypertriglyceridemia. CBC was normal on 03/14/2016. X-ray of the lumbar spine dated 10/03/2016 demonstrate dextroscoliosis with mild endplate reactive changes L2-3 through L5-S1  Reviewed films from MRI lumbar spine dated 10/12/2016, there is evidence of left disc protrusion L2-3 causing subarticular stenosis. No significant foraminal narrowing. There is also protrusion at L3-4, which is smaller than the L2-3 protrusion. No significant stenosis noted at that level. At L4-5 there is right broad-based protrusion with moderate foraminal stenosis and moderate subarticular. L5-S1 has no significant stenosis. There is facet hypertrophy L4-L5, L5-S1  Problems with hemorrhoids but no incont Problem with freq urination but good control  Patient is independent with all her self-care and mobility. She is an avid gardener likes to take Motrin for regard including use of push mower   Pain Inventory Average Pain 9 Pain Right Now 7 My pain is constant, sharp, burning, dull, stabbing, tingling and aching  In the last 24 hours, has pain interfered with the following? General activity 8 Relation with others 7 Enjoyment of life 7 What TIME of day is your pain at its worst? morning, evening, night Sleep (in general) Fair  Pain is worse with: bending and sitting Pain improves with: medication Relief from Meds: 8  Mobility walk without assistance how many minutes can you walk? ? ability to climb steps?   yes do you drive?  yes  Function retired  Neuro/Psych numbness tingling  Prior Studies bone scan x-rays new visit  Physicians involved in your care Primary care Dr. Ronnald Ramp new visit   Family History  Problem Relation Age of Onset  . Heart disease Mother   . Heart disease Father   . Prostate cancer Father   . Diabetes Sister   . Stomach cancer Sister 32  . Diabetes Brother   . Heart disease Brother   . Rectal cancer Neg Hx   . Colon cancer Neg Hx    Social History   Social History  . Marital status: Widowed    Spouse name: N/A  . Number of children: 2  . Years of education: 12   Occupational History  . Retired    Social History Main Topics  . Smoking status: Never Smoker  . Smokeless tobacco: Never Used  . Alcohol use No  . Drug use: No  . Sexual activity: No   Other Topics Concern  . None   Social History Narrative   Regular exercise-no   Caffeine Use-yes   Past Surgical History:  Procedure Laterality Date  . ABDOMINAL HYSTERECTOMY  ~ 1978   "partial"  . APPENDECTOMY  ~ 1978  . COLONOSCOPY  2005   last 2005  . POLYPECTOMY     Past Medical History:  Diagnosis Date  . Allergy   . Anxiety   . Arthritis    "left foot" (05/26/2014)  . Chronic back pain   . Chronic neck pain   . Depression   .  GERD (gastroesophageal reflux disease)   . History of chicken pox   . History of colon polyps   . History of hiatal hernia   . Lower GI bleeding   . Migraines    "monthly usually; weekly lately" (05/26/2014)  . Sciatica   . Thoracic compression fracture (HCC)    BP 124/68 (BP Location: Left Arm, Patient Position: Sitting, Cuff Size: Normal)   Pulse 79   Resp 14   SpO2 95%   Opioid Risk Score:   Fall Risk Score:  `1  Depression screen PHQ 2/9  Depression screen [facility] 2/9 02/08/2016 02/08/2016 05/11/2015 07/16/2014 12/13/2012  Decreased Interest 0 0 0 2 0  Down, Depressed, Hopeless 0 0 0 0 2  PHQ - 2 Score 0 0 0 2 2  Altered sleeping - - - 3 1   Tired, decreased energy - - - 1 3  Change in appetite - - - 3 3  Feeling bad or failure about yourself  - - - 1 2  Trouble concentrating - - - 1 3  Moving slowly or fidgety/restless - - - 0 0  Suicidal thoughts - - - 0 0  PHQ-9 Score - - - 11 14    Review of Systems  Constitutional: Positive for unexpected weight change.  HENT: Negative.   Eyes: Negative.   Respiratory: Negative.   Cardiovascular: Negative.   Gastrointestinal: Negative.   Endocrine: Negative.   Genitourinary: Negative.   Musculoskeletal: Positive for arthralgias, back pain and myalgias.  Skin: Negative.   Allergic/Immunologic: Negative.   Neurological: Positive for numbness and headaches.       Tingling  Hematological: Negative.   Psychiatric/Behavioral: Negative.   All other systems reviewed and are negative.      Objective:   Physical Exam  Constitutional: She is oriented to person, place, and time. She appears well-developed and well-nourished.  HENT:  Head: Normocephalic and atraumatic.  Eyes: Conjunctivae and EOM are normal. Pupils are equal, round, and reactive to light.  Neck: Normal range of motion. Neck supple.  Cardiovascular: Normal rate, regular rhythm and normal heart sounds.   No murmur heard. Pulmonary/Chest: Effort normal and breath sounds normal. No respiratory distress. She has no wheezes.  Abdominal: Soft. Bowel sounds are normal. She exhibits no distension. There is no tenderness.  Neurological: She is alert and oriented to person, place, and time. She has normal strength. She displays no atrophy. A sensory deficit is present. Coordination and gait normal.  Reflex Scores:      Patellar reflexes are 2+ on the right side and 1+ on the left side.      Achilles reflexes are 2+ on the right side and 1+ on the left side. 5/5 strength in bilateral deltoid, biceps, triceps, grip, hip flexion, knee extensor, ankle dorsiflexor  Sensation reduced L2-3, 4 dermatomal distribution, left side   Psychiatric: She has a normal mood and affect. Her behavior is normal. Judgment and thought content normal.  Nursing note and vitals reviewed.  Negative Tinel's, negative Phalen's, no hand intrinsic atrophy. No effusion over the right wrist.       Assessment & Plan:  1. Lumbar spinal stenosis. Left L 2-3 radiculopathy. This is chronic but worsening We will initiate physical therapy. Also start tramadol 50 mg at night. She should also continue taking her Tylenol 2 tablets in the morning and 2 tablets in the evening  Should she not respond to conservative care. Would recommend left L L2-L3 and L3-L4 transforaminal injections  2.  Lumbar spondylosis. Primarily, L4-L5, L5-S1. If she fails conservative care. She may benefit from lumbar medial branch blocks L3, L4, L5.  3. Right hand tingling, numbness. Her symptoms suggest carpal tunnel initiate wrist splint, this is affecting her driving as well as housework. Will check EMG/MCV next month

## 2016-11-17 ENCOUNTER — Telehealth: Payer: Self-pay | Admitting: Physical Medicine & Rehabilitation

## 2016-11-17 NOTE — Telephone Encounter (Signed)
Patient called office  And states needs her RX called into [facility] so it can be mailed from them her number [phone]

## 2016-11-17 NOTE — Telephone Encounter (Signed)
New Site for refill of medication, called patient to bring printed script, if any, back to clinic to be destroyed

## 2016-11-20 NOTE — Telephone Encounter (Signed)
Patient brought in old script today, it was promptly destroyed and shredded.

## 2016-11-21 LAB — TOXASSURE SELECT,+ANTIDEPR,UR

## 2016-11-22 ENCOUNTER — Telehealth: Payer: Self-pay | Admitting: *Deleted

## 2016-11-22 NOTE — Telephone Encounter (Signed)
Urine drug screen for this encounter is consistent for prescribed medication. Charlene Patterson was not taking oxycodone or hydrocodone so its absense is expected not unexpected.

## 2016-11-29 ENCOUNTER — Encounter: Payer: Self-pay | Admitting: Physical Therapy

## 2016-11-29 ENCOUNTER — Ambulatory Visit: Payer: Medicare HMO | Attending: Physical Medicine & Rehabilitation | Admitting: Physical Therapy

## 2016-11-29 DIAGNOSIS — M545 Low back pain: Secondary | ICD-10-CM | POA: Insufficient documentation

## 2016-11-29 DIAGNOSIS — G8929 Other chronic pain: Secondary | ICD-10-CM | POA: Diagnosis not present

## 2016-11-29 NOTE — Therapy (Signed)
[place], [place], [address] Phone: [phone]   Fax:  [phone]  Physical Therapy Treatment  Patient Details  Name: Charlene Patterson MRN: [id number] Date of Birth: 04-17-1946 Referring Provider: Charlett Blake, MD  Encounter Date: 11/29/2016      PT End of Session - 11/29/16 1501    Visit Number 1   Number of Visits 9   Date for PT Re-Evaluation 12/29/16   Authorization Type [employer] MCR   PT Start Time 1501   PT Stop Time 1550   PT Time Calculation (min) 49 min   Activity Tolerance Patient tolerated treatment well   Behavior During Therapy [facility] for tasks assessed/performed      Past Medical History:  Diagnosis Date  . Allergy   . Anxiety   . Arthritis    "left foot" (05/26/2014)  . Chronic back pain   . Chronic neck pain   . Depression   . GERD (gastroesophageal reflux disease)   . History of chicken pox   . History of colon polyps   . History of hiatal hernia   . Lower GI bleeding   . Migraines    "monthly usually; weekly lately" (05/26/2014)  . Sciatica   . Thoracic compression fracture [facility])     Past Surgical History:  Procedure Laterality Date  . ABDOMINAL HYSTERECTOMY  ~ 1978   "partial"  . APPENDECTOMY  ~ 1978  . COLONOSCOPY  2005   last 2005  . POLYPECTOMY      There were no vitals filed for this visit.      Subjective Assessment - 11/29/16 1504    Subjective Back pain has progressed over the years, between 2000-2010. [place] off of her house and cracked her L hip. When caring for great grand son-pain with lifting. Difficult to get up after sitting. Knows she has overdone it when she feels pulling in the back of her L leg. When standing and flexed forward, feels a pulling in the front of her R knee.    How long can you sit comfortably? constantly shifting   How long can you stand comfortably? okay until she has to bend forward   Patient Stated Goals stairs, care for  great grand son (1.32yr, wash dishes   Currently in Pain? No/denies   Pain Score --  up to 9-10/10 in AM   Pain Location Back   Pain Orientation Lower;Left   Pain Descriptors / Indicators Nagging   Pain Frequency Intermittent   Aggravating Factors  bending forward, lifting, stairs, washing dishes   Pain Relieving Factors straighten up            [facility]      Assessment   Medical Diagnosis Lumbar pain   Referring Provider KCharlett Blake MD   Onset Date/Surgical Date --  many years ago   Hand Dominance Right   Next MD Visit 12/22/2016   Prior Therapy No     Precautions   Precautions None     Restrictions   Weight Bearing Restrictions No     Balance Screen   Has the patient fallen in the past 6 months No     [facility]   Living Arrangements Other relatives   Additional Comments two story home     Prior Function   Level of Independence Independent     Cognition   Overall Cognitive Status Within Functional Limits  for tasks assessed  reports memory difficulties     Observation/Other Assessments   Focus on Therapeutic Outcomes (FOTO)  43% ability     Sensation   Additional Comments occasional N/T in feet when asleep. N/T in R hand     ROM / Strength   AROM / PROM / Strength AROM;Strength     AROM   Overall AROM Comments WFL, pain in flexion and L sidebend-Lumbar     Strength   Strength Assessment Site Hip   Right/Left Hip Right;Left   Right Hip Flexion 5/5   Right Hip Extension 5/5   Right Hip ABduction 4/5   Left Hip Flexion 5/5   Left Hip Extension 4/5   Left Hip ABduction 4-/5     Palpation   Palpation comment Limited L hamstring length                     [facility] Adult PT Treatment/Exercise - 11/29/16 0001      Exercises   Exercises Knee/Hip     Knee/Hip Exercises: Stretches   Hip Flexor Stretch Limitations thomas test position   Piriformis Stretch  Limitations figure 4 push & pul     Manual Therapy   Manual Therapy Muscle Energy Technique   Muscle Energy Technique L hamstring activation in stretch                PT Education - 11/29/16 1643    Education provided Yes   Education Details anatomy of condition, POC, HEP, exercise form/rationale, shoe lift   Person(s) Educated Patient   Methods Explanation;Demonstration;Tactile cues;Verbal cues;Handout   Comprehension Verbalized understanding;Returned demonstration;Verbal cues required;Tactile cues required;Need further instruction          PT Short Term Goals - 11/29/16 1656      PT SHORT TERM GOAL #1   Title Pt will be able to lift her great grand child with minimal discomfort in low back by 6/29   Baseline severe pain and pulling in post L leg at eval   Time 4   Period Weeks   Status New     PT SHORT TERM GOAL #2   Title Pt will be able to navigate stairs in a step-over-step pattern   Baseline avoids stars or leads with single leg at eval   Time 4   Period Weeks   Status New     PT SHORT TERM GOAL #3   Title 5/5 hip abduction for proper support to lumbopelvic region   Baseline see flowsheet   Time 4   Period Weeks   Status New     PT SHORT TERM GOAL #4   Title FOTO to 53% ability to indicate significant improvement in functional ability   Baseline 43% ability at eval   Time 4   Period Weeks   Status New                  Plan - 11/29/16 1552    Clinical Impression Statement Pt presents to PT with complaints of L hip and LBP that is chronic. Good strength with exception of hip abductors. Tightness in L hamstring noted with limited flexiblity. MET utilized to correct pelvic rotation making leg length discrepancy obvious (L 83.25 cm, R 82.5 cm). Lift was placed in R shoe, 3 layers-1cm. After walking length of clinic, pt was able to flex forward with very minimal pain vs severe pain in the beginning of the evaluation. Pt will benefit from skilled  PT in  order to stabilize lumbopelvic region with lift in shoe to decrease spasm through hip and lower back.    History and Personal Factors relevant to plan of care: anxiety, depression, osteopenia   Clinical Presentation Stable   Clinical Decision Making Low   Rehab Potential Good   PT Frequency 2x / week   PT Duration 4 weeks   PT Treatment/Interventions ADLs/Self Care Home Management;Cryotherapy;Electrical Stimulation;Functional mobility training;Stair training;Gait training;Ultrasound;Traction;Moist Heat;Therapeutic activities;Therapeutic exercise;Balance training;Neuromuscular re-education;Patient/family education;Passive range of motion;Manual techniques;Dry needling;Taping   PT Next Visit Plan how did heel lift feel?  soft tissue/myofasical release lumbar and hip prn, abdominal engagement, glut max activation   PT Home Exercise Plan figure 4 stretch, thomas test stretch, walk with heel lift   Consulted and Agree with Plan of Care Patient      Patient will benefit from skilled therapeutic intervention in order to improve the following deficits and impairments:  Difficulty walking, Increased muscle spasms, Decreased activity tolerance, Pain, Improper body mechanics, Impaired flexibility, Decreased strength, Postural dysfunction  Visit Diagnosis: Chronic left-sided low back pain, with sciatica presence unspecified - Plan: PT plan of care cert/re-cert       G-Codes - Dec 08, 2016 1659    Functional Assessment Tool Used (Outpatient Only) FOTO 43% ability (goal 53%), clinical judgement   Functional Limitation Mobility: Walking and moving around   Mobility: Walking and Moving Around Current Status ([id number]) At least 40 percent but less than 60 percent impaired, limited or restricted   Mobility: Walking and Moving Around Goal Status [phone]) At least 20 percent but less than 40 percent impaired, limited or restricted      Problem List Patient Active Problem List   Diagnosis Date Noted  .  Routine general medical examination at a health care facility 02/08/2016  . Hypertriglyceridemia 05/11/2015  . Memory loss 05/11/2015  . Hyperlipidemia with target LDL less than 130 10/29/2014  . Frequent PVCs 10/29/2014  . Vitamin D deficiency 09/24/2014  . Colitis 05/25/2014  . Allergic rhinitis 05/25/2014  . Spinal stenosis of lumbar region with radiculopathy 05/25/2014  . Cervical radiculitis 05/12/2014  . Left-sided low back pain with left-sided sciatica 05/12/2014  . Thoracic compression fracture ([place]) 05/12/2014  . Visit for screening mammogram 05/10/2014  . GERD (gastroesophageal reflux disease) 12/13/2012  . Osteopenia of the elderly 12/13/2012  . Depression with somatization 12/13/2012   Leoma Folds C. Tivis Wherry PT, DPT 12-08-2016 [place] [facility] [address] [place], [place], [address] Phone: [phone]   Fax:  [phone]  Name: Charlene Patterson MRN: [id number] Date of Birth: [birth year]

## 2016-12-05 ENCOUNTER — Ambulatory Visit: Payer: Medicare HMO | Attending: Physical Medicine & Rehabilitation | Admitting: Physical Therapy

## 2016-12-05 ENCOUNTER — Encounter: Payer: Self-pay | Admitting: Physical Therapy

## 2016-12-05 DIAGNOSIS — M545 Low back pain: Secondary | ICD-10-CM | POA: Diagnosis not present

## 2016-12-05 DIAGNOSIS — G8929 Other chronic pain: Secondary | ICD-10-CM | POA: Insufficient documentation

## 2016-12-05 NOTE — Therapy (Signed)
[place], [place], [address] Phone: [phone]   Fax:  [phone]  Patient Details  Name: Charlene Patterson MRN: [id number] Date of Birth: [birth year] Referring Provider:  Janith Lima, MD  Encounter Date: 12/05/2016   Melvenia Needles PTA 12/05/2016, 5:44 PM  [place] [facility] [address] [place], [place], [address] Phone: [phone]   Fax:  [phone]

## 2016-12-05 NOTE — Patient Instructions (Signed)

## 2016-12-06 ENCOUNTER — Encounter: Payer: Self-pay | Admitting: Physical Therapy

## 2016-12-06 ENCOUNTER — Ambulatory Visit: Payer: Medicare HMO | Admitting: Physical Therapy

## 2016-12-06 DIAGNOSIS — M545 Low back pain: Secondary | ICD-10-CM | POA: Diagnosis not present

## 2016-12-06 DIAGNOSIS — G8929 Other chronic pain: Secondary | ICD-10-CM | POA: Diagnosis not present

## 2016-12-06 NOTE — Therapy (Signed)
[place], [place], [address] Phone: [phone]   Fax:  [phone]  Physical Therapy Treatment  Patient Details  Name: Charlene Patterson MRN: [id number] Date of Birth: 23-Jul-1945 Referring Provider: Charlett Blake, MD  Encounter Date: 12/06/2016      PT End of Session - 12/06/16 1419    Visit Number 3   Number of Visits 9   Date for PT Re-Evaluation 12/29/16   PT Start Time 1331   PT Stop Time 1420   PT Time Calculation (min) 49 min   Activity Tolerance Patient tolerated treatment well   Behavior During Therapy [facility] for tasks assessed/performed      Past Medical History:  Diagnosis Date  . Allergy   . Anxiety   . Arthritis    "left foot" (05/26/2014)  . Chronic back pain   . Chronic neck pain   . Depression   . GERD (gastroesophageal reflux disease)   . History of chicken pox   . History of colon polyps   . History of hiatal hernia   . Lower GI bleeding   . Migraines    "monthly usually; weekly lately" (05/26/2014)  . Sciatica   . Thoracic compression fracture [facility])     Past Surgical History:  Procedure Laterality Date  . ABDOMINAL HYSTERECTOMY  ~ 1978   "partial"  . APPENDECTOMY  ~ 1978  . COLONOSCOPY  2005   last 2005  . POLYPECTOMY      There were no vitals filed for this visit.      Subjective Assessment - 12/06/16 1335    Subjective Less pain after session.  I was really tired.  5/10 today.  I am going to change the way I wash dishes and a few other things.    Currently in Pain? Yes   Pain Score 5    Pain Location Back   Pain Orientation Left;Lower   Pain Descriptors / Indicators Nagging                         [facility] Adult PT Treatment/Exercise - 12/06/16 0001      Knee/Hip Exercises: Stretches   Piriformis Stretch 3 reps;20 seconds  each   Other Knee/Hip Stretches single knee to stretch 2 X 20 seconds     Knee/Hip Exercises: Sidelying   Other  Sidelying Knee/Hip Exercises trunk PNF both diagonals emphaszing posteriot movements,  , small pillow at waist.    also hip depression in this position 5 x each AA movements.  lumbopelvic disassociation.      Manual Therapy   Manual therapy comments soft tissue work mid to lower back and gluteals                PT Education - 12/05/16 1736    Education provided Yes   Education Details ADL,  Quadratus Lumborum information   Person(s) Educated Patient   Methods Explanation;Demonstration;Handout   Comprehension Verbalized understanding;Need further instruction          PT Short Term Goals - 11/29/16 1656      PT SHORT TERM GOAL #1   Title Pt will be able to lift her great grand child with minimal discomfort in low back by 6/29   Baseline severe pain and pulling in post L leg at eval   Time 4   Period Weeks   Status New     PT SHORT TERM GOAL #2   Title  Pt will be able to navigate stairs in a step-over-step pattern   Baseline avoids stars or leads with single leg at eval   Time 4   Period Weeks   Status New     PT SHORT TERM GOAL #3   Title 5/5 hip abduction for proper support to lumbopelvic region   Baseline see flowsheet   Time 4   Period Weeks   Status New     PT SHORT TERM GOAL #4   Title FOTO to 53% ability to indicate significant improvement in functional ability   Baseline 43% ability at eval   Time 4   Period Weeks   Status New                  Plan - 12/06/16 1419    Clinical Impression Statement 5/10 pain at end of session.  Focus on exercise today.  Patient may still sore in areas of soft tissue work from yesterday.  Patient more flexiblein hips and low back.  she is able t touch knee to chest   PT Next Visit Plan how did heel lift feel?  soft tissue/myofasical release lumbar and hip prn, abdominal engagement, glut max activation   PT Home Exercise Plan figure 4 stretch, thomas test stretch, walk with heel lift   Consulted and Agree with  Plan of Care Patient      Patient will benefit from skilled therapeutic intervention in order to improve the following deficits and impairments:  Difficulty walking, Increased muscle spasms, Decreased activity tolerance, Pain, Improper body mechanics, Impaired flexibility, Decreased strength, Postural dysfunction  Visit Diagnosis: Chronic left-sided low back pain, with sciatica presence unspecified     Problem List Patient Active Problem List   Diagnosis Date Noted  . Routine general medical examination at a health care facility 02/08/2016  . Hypertriglyceridemia 05/11/2015  . Memory loss 05/11/2015  . Hyperlipidemia with target LDL less than 130 10/29/2014  . Frequent PVCs 10/29/2014  . Vitamin D deficiency 09/24/2014  . Colitis 05/25/2014  . Allergic rhinitis 05/25/2014  . Spinal stenosis of lumbar region with radiculopathy 05/25/2014  . Cervical radiculitis 05/12/2014  . Left-sided low back pain with left-sided sciatica 05/12/2014  . Thoracic compression fracture ([place]) 05/12/2014  . Visit for screening mammogram 05/10/2014  . GERD (gastroesophageal reflux disease) 12/13/2012  . Osteopenia of the elderly 12/13/2012  . Depression with somatization 12/13/2012    Charlene Patterson PTA 12/06/2016, 2:26 PM  [facility] [address] [place], [place], [address] Phone: [phone]   Fax:  [phone]  Name: Charlene Patterson MRN: [id number] Date of Birth: 1946/01/13

## 2016-12-11 ENCOUNTER — Encounter: Payer: Self-pay | Admitting: Physical Therapy

## 2016-12-11 ENCOUNTER — Ambulatory Visit: Payer: Medicare HMO | Admitting: Physical Therapy

## 2016-12-11 DIAGNOSIS — G8929 Other chronic pain: Secondary | ICD-10-CM

## 2016-12-11 DIAGNOSIS — M545 Low back pain: Principal | ICD-10-CM

## 2016-12-11 NOTE — Therapy (Signed)
[place], [place], [address] Phone: [phone]   Fax:  [phone]  Physical Therapy Treatment  Patient Details  Name: Charlene Patterson MRN: [id number] Date of Birth: [birth year] Referring Provider: Charlett Blake, MD  Encounter Date: 12/11/2016      PT End of Session - 12/11/16 1306    Visit Number 4   Number of Visits 10   Date for PT Re-Evaluation 12/29/16   PT Start Time 1324   PT Stop Time 1237   PT Time Calculation (min) 50 min   Activity Tolerance Patient tolerated treatment well   Behavior During Therapy [facility] for tasks assessed/performed      Past Medical History:  Diagnosis Date  . Allergy   . Anxiety   . Arthritis    "left foot" (05/26/2014)  . Chronic back pain   . Chronic neck pain   . Depression   . GERD (gastroesophageal reflux disease)   . History of chicken pox   . History of colon polyps   . History of hiatal hernia   . Lower GI bleeding   . Migraines    "monthly usually; weekly lately" (05/26/2014)  . Sciatica   . Thoracic compression fracture [facility])     Past Surgical History:  Procedure Laterality Date  . ABDOMINAL HYSTERECTOMY  ~ 1978   "partial"  . APPENDECTOMY  ~ 1978  . COLONOSCOPY  2005   last 2005  . POLYPECTOMY      There were no vitals filed for this visit.      Subjective Assessment - 12/11/16 1150    Subjective LT pain side. 4/10 with moving.   Was able to work in yard I had less pain. I was able to work out on the bench in the yard.  I was tirede only.  heel lift helpful with walking.  I had to be careful wearing with dress shoes.  It brought heel up.    Currently in Pain? Yes   Pain Score 4    Pain Location Back   Pain Orientation Left;Lateral   Pain Descriptors / Indicators Nagging   Pain Frequency Intermittent   Aggravating Factors  moving not sure which moves ittitate   Pain Relieving Factors rest, exercises,  good mechanics, heel lift.                          [place] Adult PT Treatment/Exercise - 12/11/16 0001      Knee/Hip Exercises: Stretches   Passive Hamstring Stretch 3 reps;30 seconds   Passive Hamstring Stretch Limitations tight   Quad Stretch 1 rep;30 seconds  right   Hip Flexor Stretch Limitations thomas test position  2 X 30 seconds   Other Knee/Hip Stretches single knee to chest each 20 seconds     Knee/Hip Exercises: Supine   Straight Leg Raises  And hip adductor squeeze 10 x 5 seconds.  10 reps;2 sets  left only to lower pelvis on left   Other Supine Knee/Hip Exercises Pelvic rotation:  Contract relax supine Hand to knee left,  right straight leg press into mat  5 X 2 sets 5 second holds,  HEP     Knee/Hip Exercises: Sidelying   Clams 10 x right/ left   Other Sidelying Knee/Hip Exercises book openere.   Other Sidelying Knee/Hip Exercises trunk PNF both diagonals emphaszing posteriot movements,  , small pillow at waist.    also hip depression in this  position 5 x each AA movements.  lumbopelvic disassociation.                 PT Education - 12/11/16 1306    Education provided Yes   Education Details Muscle energy.   Person(s) Educated Patient   Methods Explanation;Tactile cues;Verbal cues;Handout   Comprehension Verbalized understanding;Returned demonstration          PT Short Term Goals - 12/11/16 1159      PT SHORT TERM GOAL #1   Title Pt will be able to lift her great grand child with minimal discomfort in low back by 6/29   Baseline did not do lately   Time 4   Period Weeks   Status On-going     PT SHORT TERM GOAL #2   Baseline able, sometimes needs to go slow   Time 4   Period Weeks   Status On-going     PT SHORT TERM GOAL #3   Title 5/5 hip abduction for proper support to lumbopelvic region   Time 4   Period Weeks   Status Unable to assess     PT SHORT TERM GOAL #4   Title FOTO to 53% ability to indicate significant improvement in functional ability    Period Weeks   Status Unable to assess                  Plan - 12/11/16 1307    Clinical Impression Statement 0 pain at end of session.  Muscle energy helpful to level hips.  Patient now able to ascend and descend steps step over step with extra time.    PT Next Visit Plan Check hips to see if level. Abdominal engagement, glut max activation.     PT Home Exercise Plan figure 4 stretch, thomas test stretch, walk with heel lift, contract relax for pelvis rotation.   Consulted and Agree with Plan of Care Patient      Patient will benefit from skilled therapeutic intervention in order to improve the following deficits and impairments:  Difficulty walking, Increased muscle spasms, Decreased activity tolerance, Pain, Improper body mechanics, Impaired flexibility, Decreased strength, Postural dysfunction  Visit Diagnosis: Chronic left-sided low back pain, with sciatica presence unspecified     Problem List Patient Active Problem List   Diagnosis Date Noted  . Routine general medical examination at a health care facility 02/08/2016  . Hypertriglyceridemia 05/11/2015  . Memory loss 05/11/2015  . Hyperlipidemia with target LDL less than 130 10/29/2014  . Frequent PVCs 10/29/2014  . Vitamin D deficiency 09/24/2014  . Colitis 05/25/2014  . Allergic rhinitis 05/25/2014  . Spinal stenosis of lumbar region with radiculopathy 05/25/2014  . Cervical radiculitis 05/12/2014  . Left-sided low back pain with left-sided sciatica 05/12/2014  . Thoracic compression fracture ([place]) 05/12/2014  . Visit for screening mammogram 05/10/2014  . GERD (gastroesophageal reflux disease) 12/13/2012  . Osteopenia of the elderly 12/13/2012  . Depression with somatization 12/13/2012    [name] PTA 12/11/2016, 1:17 PM  [name], [place], [address] Phone: [phone]   Fax:  [phone]  Name: Charlene Patterson MRN:  [id number] Date of Birth: 31-Jul-1945

## 2016-12-11 NOTE — Patient Instructions (Signed)
Pelvic Rotation: Contract / Relax (Supine)    Hands against left knee, resist bent leg moving toward head. Press straight leg down. Hold __5__ seconds. Relax. Repeat _5___ times per set. Do _1 to 4___ sets per session. Do:   as needed____ sessions per day.   First:  Bridge 1 X then lower legs straight. Check hips to see if level.  If level do not do this exercise. If hips are unlevel, press the thigh on the side that is high. http://orth.exer.us/277   Copyright  VHI. All rights reserved.

## 2016-12-13 ENCOUNTER — Ambulatory Visit: Payer: Medicare HMO | Admitting: Physical Therapy

## 2016-12-13 ENCOUNTER — Encounter: Payer: Self-pay | Admitting: Physical Therapy

## 2016-12-13 DIAGNOSIS — M545 Low back pain: Secondary | ICD-10-CM | POA: Diagnosis not present

## 2016-12-13 DIAGNOSIS — G8929 Other chronic pain: Secondary | ICD-10-CM | POA: Diagnosis not present

## 2016-12-13 NOTE — Therapy (Signed)
[place], [place], [address] Phone: [phone]   Fax:  [phone]  Physical Therapy Treatment  Patient Details  Name: Charlene Patterson MRN: [id number] Date of Birth: August 03, 1945 Referring Provider: Charlett Blake, MD  Encounter Date: 12/13/2016      PT End of Session - 12/13/16 1332    Visit Number 5   Number of Visits 10   Date for PT Re-Evaluation 12/29/16   Authorization Type [employer] MCR   PT Start Time 1332   PT Stop Time 1415   PT Time Calculation (min) 43 min   Activity Tolerance Patient tolerated treatment well   Behavior During Therapy [facility] for tasks assessed/performed      Past Medical History:  Diagnosis Date  . Allergy   . Anxiety   . Arthritis    "left foot" (05/26/2014)  . Chronic back pain   . Chronic neck pain   . Depression   . GERD (gastroesophageal reflux disease)   . History of chicken pox   . History of colon polyps   . History of hiatal hernia   . Lower GI bleeding   . Migraines    "monthly usually; weekly lately" (05/26/2014)  . Sciatica   . Thoracic compression fracture [facility]     Past Surgical History:  Procedure Laterality Date  . ABDOMINAL HYSTERECTOMY  ~ 1978   "partial"  . APPENDECTOMY  ~ 1978  . COLONOSCOPY  2005   last 2005  . POLYPECTOMY      There were no vitals filed for this visit.      Subjective Assessment - 12/13/16 1332    Subjective pt reports back has been doing really well.    Patient Stated Goals stairs, care for great grand son (1.80yr), wash dishes   Currently in Pain? Yes   Pain Score 3    Pain Location Back   Pain Orientation Left                         [facility] Adult PT Treatment/Exercise - 12/13/16 0001      Exercises   Exercises Lumbar     Lumbar Exercises: Prone   Other Prone Lumbar Exercises upper body extension x10   Other Prone Lumbar Exercises prone scapular retraction with GHJ ext     Knee/Hip  Exercises: Stretches   Hip Flexor Stretch Limitations thomas test position  30s each   Other Knee/Hip Stretches cat camel child pose   Other Knee/Hip Stretches single knee to chest each 30 seconds, opposite leg extended     Knee/Hip Exercises: Aerobic   Nustep 5 min L 5     Knee/Hip Exercises: Supine   Bridges with Ball Squeeze 15 reps  on heels     Knee/Hip Exercises: Sidelying   Clams x20 each green tband     Manual Therapy   Manual Therapy Soft tissue mobilization   Soft tissue mobilization IASTM bilteral lumbar paraspinals                PT Education - 12/13/16 1417    Education provided Yes   Education Details exercise form/rationale   Person(s) Educated Patient   Methods Explanation;Demonstration;Tactile cues;Verbal cues;Handout   Comprehension Verbalized understanding;Returned demonstration;Verbal cues required;Tactile cues required;Need further instruction          PT Short Term Goals - 12/11/16 1159      PT SHORT TERM GOAL #1   Title Pt will  be able to lift her great grand child with minimal discomfort in low back by 6/29   Baseline did not do lately   Time 4   Period Weeks   Status On-going     PT SHORT TERM GOAL #2   Baseline able, sometimes needs to go slow   Time 4   Period Weeks   Status On-going     PT SHORT TERM GOAL #3   Title 5/5 hip abduction for proper support to lumbopelvic region   Time 4   Period Weeks   Status Unable to assess     PT SHORT TERM GOAL #4   Title FOTO to 53% ability to indicate significant improvement in functional ability   Period Weeks   Status Unable to assess                  Plan - 12/13/16 1408    Clinical Impression Statement Activation of thoracic extensors to decrease pulling sensation at thoraco lumbar junction. exercises to strengthen hip musculature to decrease overuse of QLs bilaterally. Pt pretented with level pelvis today, still wearing lift.    PT Treatment/Interventions ADLs/Self  Care Home Management;Cryotherapy;Electrical Stimulation;Functional mobility training;Stair training;Gait training;Ultrasound;Traction;Moist Heat;Therapeutic activities;Therapeutic exercise;Balance training;Neuromuscular re-education;Patient/family education;Passive range of motion;Manual techniques;Dry needling;Taping   PT Next Visit Plan Abdominal engagement, glut max activation.    Keep an eye on level pelvis. check pain at [place] figure 4 stretch, thomas test stretch, walk with heel lift, contract relax for pelvis rotation. bridge on heels, clam, prone scapular retraction, cat/camel/child pose   Consulted and Agree with Plan of Care Patient      Patient will benefit from skilled therapeutic intervention in order to improve the following deficits and impairments:  Difficulty walking, Increased muscle spasms, Decreased activity tolerance, Pain, Improper body mechanics, Impaired flexibility, Decreased strength, Postural dysfunction  Visit Diagnosis: Chronic left-sided low back pain, with sciatica presence unspecified     Problem List Patient Active Problem List   Diagnosis Date Noted  . Routine general medical examination at a health care facility 02/08/2016  . Hypertriglyceridemia 05/11/2015  . Memory loss 05/11/2015  . Hyperlipidemia with target LDL less than 130 10/29/2014  . Frequent PVCs 10/29/2014  . Vitamin D deficiency 09/24/2014  . Colitis 05/25/2014  . Allergic rhinitis 05/25/2014  . Spinal stenosis of lumbar region with radiculopathy 05/25/2014  . Cervical radiculitis 05/12/2014  . Left-sided low back pain with left-sided sciatica 05/12/2014  . Thoracic compression fracture ([place]) 05/12/2014  . Visit for screening mammogram 05/10/2014  . GERD (gastroesophageal reflux disease) 12/13/2012  . Osteopenia of the elderly 12/13/2012  . Depression with somatization 12/13/2012   Rogelio Winbush C. Ola Fawver PT, DPT 12/13/16 2:18 PM   [facility] [address] [place], [place], [address] Phone: [phone]   Fax:  [phone]  Name: Charlene Patterson MRN: [id number] Date of Birth: 07-29-1945

## 2016-12-18 ENCOUNTER — Encounter: Payer: Self-pay | Admitting: Physical Therapy

## 2016-12-18 ENCOUNTER — Ambulatory Visit: Payer: Medicare HMO | Admitting: Physical Therapy

## 2016-12-18 DIAGNOSIS — G8929 Other chronic pain: Secondary | ICD-10-CM | POA: Diagnosis not present

## 2016-12-18 DIAGNOSIS — M545 Low back pain: Principal | ICD-10-CM

## 2016-12-18 NOTE — Therapy (Signed)
Charlene Patterson, [place], [address] Phone: [phone]   Fax:  [phone]  Physical Therapy Treatment  Patient Details  Name: Charlene Patterson MRN: [id number] Date of Birth: February 24, 1946 Referring Provider: Charlett Blake, MD  Encounter Date: 12/18/2016      PT End of Session - 12/18/16 1240    Visit Number 6   Number of Visits 10   Date for PT Re-Evaluation 12/29/16   PT Start Time 4481   PT Stop Time 1233   PT Time Calculation (min) 44 min   Activity Tolerance Patient tolerated treatment well   Behavior During Therapy [facility] for tasks assessed/performed      Past Medical History:  Diagnosis Date  . Allergy   . Anxiety   . Arthritis    "left foot" (05/26/2014)  . Chronic back pain   . Chronic neck pain   . Depression   . GERD (gastroesophageal reflux disease)   . History of chicken pox   . History of colon polyps   . History of hiatal hernia   . Lower GI bleeding   . Migraines    "monthly usually; weekly lately" (05/26/2014)  . Sciatica   . Thoracic compression fracture [facility])     Past Surgical History:  Procedure Laterality Date  . ABDOMINAL HYSTERECTOMY  ~ 1978   "partial"  . APPENDECTOMY  ~ 1978  . COLONOSCOPY  2005   last 2005  . POLYPECTOMY      There were no vitals filed for this visit.      Subjective Assessment - 12/18/16 1154    Subjective 3/10.  Exercises are going well.     Currently in Pain? Yes   Pain Score 3    Pain Location Back   Pain Orientation Left   Pain Descriptors / Indicators Nagging   Pain Frequency Intermittent   Aggravating Factors  moving a certain way   Pain Relieving Factors rest, exercise,  godd mechanics,  heel lift                         [facility] Adult PT Treatment/Exercise - 12/18/16 0001      Lumbar Exercises: Supine   Bridge 10 reps   Bridge Limitations 2 sets 1 set with ball squeeze and up on heels     Lumbar Exercises:  Prone   Other Prone Lumbar Exercises upper body extension x10  cued head position and technique   Other Prone Lumbar Exercises prone scapular retraction with GHJ ext     Knee/Hip Exercises: Stretches   Hip Flexor Stretch Limitations thomas test position  30s each, 3 X ,  thigh was high initially then eased down     Knee/Hip Exercises: Aerobic   Nustep 5 min L 5  UE/LE     Knee/Hip Exercises: Sidelying   Clams x20 each green tband  cued initially for technique and monitored      Manual Therapy   Manual Therapy Soft tissue mobilization   Soft tissue mobilization strummingilliopsoas  left  Very sensitive and tight                PT Education - 12/18/16 1239    Education provided Yes   Education Details manual technique , strumming   Person(s) Educated Patient   Methods Explanation;Demonstration;Verbal cues   Comprehension Verbalized understanding;Returned demonstration          PT Short Term Goals - 12/18/16 1245  PT SHORT TERM GOAL #1   Title Pt will be able to lift her great grand child with minimal discomfort in low back by 6/29   Time 4   Period Weeks   Status Unable to assess     PT SHORT TERM GOAL #2   Title Pt will be able to navigate stairs in a step-over-step pattern   Period Weeks   Status Unable to assess     PT SHORT TERM GOAL #3   Title 5/5 hip abduction for proper support to lumbopelvic region   Time 4   Period Weeks   Status Unable to assess     PT SHORT TERM GOAL #4   Title FOTO to 53% ability to indicate significant improvement in functional ability   Time 4   Period Weeks   Status Unable to assess                  Plan - 12/18/16 1241    Clinical Impression Statement No pain at end of session.  Manual to illiopsoas helpful.  Patient able to increase length to WNL   PT Next Visit Plan Abdominal engagement, glut max activation.    Keep an eye on level pelvis.  Manual Lt illiopsoas as needed.  MMT ABD, check how she does  on stairs   PT Home Exercise Plan figure 4 stretch, thomas test stretch, walk with heel lift, contract relax for pelvis rotation. bridge on heels, clam, prone scapular retraction, cat/camel/child pose   Consulted and Agree with Plan of Care Patient      Patient will benefit from skilled therapeutic intervention in order to improve the following deficits and impairments:  Difficulty walking, Increased muscle spasms, Decreased activity tolerance, Pain, Improper body mechanics, Impaired flexibility, Decreased strength, Postural dysfunction  Visit Diagnosis: Chronic left-sided low back pain, with sciatica presence unspecified     Problem List Patient Active Problem List   Diagnosis Date Noted  . Routine general medical examination at a health care facility 02/08/2016  . Hypertriglyceridemia 05/11/2015  . Memory loss 05/11/2015  . Hyperlipidemia with target LDL less than 130 10/29/2014  . Frequent PVCs 10/29/2014  . Vitamin D deficiency 09/24/2014  . Colitis 05/25/2014  . Allergic rhinitis 05/25/2014  . Spinal stenosis of lumbar region with radiculopathy 05/25/2014  . Cervical radiculitis 05/12/2014  . Left-sided low back pain with left-sided sciatica 05/12/2014  . Thoracic compression fracture ([place]) 05/12/2014  . Visit for screening mammogram 05/10/2014  . GERD (gastroesophageal reflux disease) 12/13/2012  . Osteopenia of the elderly 12/13/2012  . Depression with somatization 12/13/2012    Vuong Musa PTA 12/18/2016, 12:48 PM  [facility] [address] [place], [place], [address] Phone: [phone]   Fax:  [phone]  Name: Charlene Patterson MRN: [id number] Date of Birth: 27-Sep-1945

## 2016-12-20 ENCOUNTER — Ambulatory Visit: Payer: Medicare HMO | Admitting: Physical Therapy

## 2016-12-20 ENCOUNTER — Encounter: Payer: Self-pay | Admitting: Physical Therapy

## 2016-12-20 DIAGNOSIS — M545 Low back pain: Secondary | ICD-10-CM | POA: Diagnosis not present

## 2016-12-20 DIAGNOSIS — G8929 Other chronic pain: Secondary | ICD-10-CM | POA: Diagnosis not present

## 2016-12-20 NOTE — Therapy (Signed)
[place], [place], [address] Phone: [phone]   Fax:  [phone]  Physical Therapy Treatment  Patient Details  Name: Charlene Patterson MRN: [id number] Date of Birth: 08/10/1945 Referring Provider: Charlett Blake, MD  Encounter Date: 12/20/2016      PT End of Session - 12/20/16 1142    Visit Number 7   Number of Visits 10   Date for PT Re-Evaluation 12/29/16   Authorization Type [employer] MCR   PT Start Time 1143   PT Stop Time 1228   PT Time Calculation (min) 45 min   Activity Tolerance Patient tolerated treatment well   Behavior During Therapy [facility] for tasks assessed/performed      Past Medical History:  Diagnosis Date  . Allergy   . Anxiety   . Arthritis    "left foot" (05/26/2014)  . Chronic back pain   . Chronic neck pain   . Depression   . GERD (gastroesophageal reflux disease)   . History of chicken pox   . History of colon polyps   . History of hiatal hernia   . Lower GI bleeding   . Migraines    "monthly usually; weekly lately" (05/26/2014)  . Sciatica   . Thoracic compression fracture [facility])     Past Surgical History:  Procedure Laterality Date  . ABDOMINAL HYSTERECTOMY  ~ 1978   "partial"  . APPENDECTOMY  ~ 1978  . COLONOSCOPY  2005   last 2005  . POLYPECTOMY      There were no vitals filed for this visit.      Subjective Assessment - 12/20/16 1142    Subjective Reports having some pain today after working out in the yard. Was bent over watching water to make sure it was getting to the roots. Could tell that the illiopsoas was improved.    Patient Stated Goals stairs, care for great grand son (1.78yr), wash dishes   Currently in Pain? Yes   Pain Score 4    Pain Location Back   Pain Orientation Left   Pain Descriptors / Indicators Sore                         [facility] Adult PT Treatment/Exercise - 12/20/16 0001      Lumbar Exercises: Aerobic   UBE  (Upper Arm Bike) retro 4 min     Lumbar Exercises: Supine   Other Supine Lumbar Exercises hip flx to 90, ball bw hands reach to ceiling, knees bend/ext     Knee/Hip Exercises: Stretches   Hip Flexor Stretch Limitations thomas test position 30s each     Knee/Hip Exercises: Standing   Step Down Both;10 reps   Step Down Limitations forward, tap heel   Wall Squat Limitations 10x3s iso abd press into hands   Other Standing Knee Exercises hip hike edge of step   Other Standing Knee Exercises isometric abd & ext press into wall     Knee/Hip Exercises: Supine   Single Leg Bridge Both;10 reps  ball bw knees     Manual Therapy   Soft tissue mobilization IASTM and strumming illiopsoas, L                PT Education - 12/20/16 1235    Education provided Yes   Education Details review of gait and strength progressions & heel lift, exercise form/rationale   Person(s) Educated Patient   Methods Explanation;Demonstration;Tactile cues;Verbal cues;Handout   Comprehension  Verbalized understanding;Returned demonstration;Verbal cues required;Tactile cues required;Need further instruction          PT Short Term Goals - 12/18/16 1245      PT SHORT TERM GOAL #1   Title Pt will be able to lift her great grand child with minimal discomfort in low back by 6/29   Time 4   Period Weeks   Status Unable to assess     PT SHORT TERM GOAL #2   Title Pt will be able to navigate stairs in a step-over-step pattern   Period Weeks   Status Unable to assess     PT SHORT TERM GOAL #3   Title 5/5 hip abduction for proper support to lumbopelvic region   Time 4   Period Weeks   Status Unable to assess     PT SHORT TERM GOAL #4   Title FOTO to 53% ability to indicate significant improvement in functional ability   Time 4   Period Weeks   Status Unable to assess                  Plan - 12/20/16 1231    Clinical Impression Statement notable relase of illiopsoas and when pt stood  from chair reported "feeling it in my back", she did not use her glutes and was able to stand wihtout pain at next attempt. Heel lift remaining in R shoe after evaluation of gait, cont weakness of hip abductors will continue to benefit from challenges.    PT Treatment/Interventions ADLs/Self Care Home Management;Cryotherapy;Electrical Stimulation;Functional mobility training;Stair training;Gait training;Ultrasound;Traction;Moist Heat;Therapeutic activities;Therapeutic exercise;Balance training;Neuromuscular re-education;Patient/family education;Passive range of motion;Manual techniques;Dry needling;Taping   PT Next Visit Plan CKC strength with glut activation   PT Home Exercise Plan figure 4 stretch, thomas test stretch, walk with heel lift, contract relax for pelvis rotation. bridge on heels, clam, prone scapular retraction, cat/camel/child pose   Consulted and Agree with Plan of Care Patient      Patient will benefit from skilled therapeutic intervention in order to improve the following deficits and impairments:  Difficulty walking, Increased muscle spasms, Decreased activity tolerance, Pain, Improper body mechanics, Impaired flexibility, Decreased strength, Postural dysfunction  Visit Diagnosis: Chronic left-sided low back pain, with sciatica presence unspecified     Problem List Patient Active Problem List   Diagnosis Date Noted  . Routine general medical examination at a health care facility 02/08/2016  . Hypertriglyceridemia 05/11/2015  . Memory loss 05/11/2015  . Hyperlipidemia with target LDL less than 130 10/29/2014  . Frequent PVCs 10/29/2014  . Vitamin D deficiency 09/24/2014  . Colitis 05/25/2014  . Allergic rhinitis 05/25/2014  . Spinal stenosis of lumbar region with radiculopathy 05/25/2014  . Cervical radiculitis 05/12/2014  . Left-sided low back pain with left-sided sciatica 05/12/2014  . Thoracic compression fracture ([place]) 05/12/2014  . Visit for screening mammogram  05/10/2014  . GERD (gastroesophageal reflux disease) 12/13/2012  . Osteopenia of the elderly 12/13/2012  . Depression with somatization 12/13/2012    Nahome Bublitz C. Damico Partin PT, DPT 12/20/16 12:35 PM   [place] [facility] [address] [place], [place], [address] Phone: [phone]   Fax:  [phone]  Name: Charlene Patterson MRN: [id number] Date of Birth: February 23, 1946

## 2016-12-22 ENCOUNTER — Encounter: Payer: Self-pay | Admitting: Physical Medicine & Rehabilitation

## 2016-12-22 ENCOUNTER — Encounter: Payer: Medicare HMO | Attending: Physical Medicine & Rehabilitation

## 2016-12-22 ENCOUNTER — Ambulatory Visit (HOSPITAL_BASED_OUTPATIENT_CLINIC_OR_DEPARTMENT_OTHER): Payer: Medicare HMO | Admitting: Physical Medicine & Rehabilitation

## 2016-12-22 VITALS — BP 117/75 | HR 74

## 2016-12-22 DIAGNOSIS — M79605 Pain in left leg: Secondary | ICD-10-CM | POA: Insufficient documentation

## 2016-12-22 DIAGNOSIS — Z79899 Other long term (current) drug therapy: Secondary | ICD-10-CM | POA: Diagnosis not present

## 2016-12-22 DIAGNOSIS — G8929 Other chronic pain: Secondary | ICD-10-CM | POA: Insufficient documentation

## 2016-12-22 DIAGNOSIS — M48061 Spinal stenosis, lumbar region without neurogenic claudication: Secondary | ICD-10-CM | POA: Insufficient documentation

## 2016-12-22 DIAGNOSIS — M47896 Other spondylosis, lumbar region: Secondary | ICD-10-CM | POA: Insufficient documentation

## 2016-12-22 DIAGNOSIS — M545 Low back pain, unspecified: Secondary | ICD-10-CM

## 2016-12-22 DIAGNOSIS — G5601 Carpal tunnel syndrome, right upper limb: Secondary | ICD-10-CM | POA: Diagnosis not present

## 2016-12-22 DIAGNOSIS — Z5181 Encounter for therapeutic drug level monitoring: Secondary | ICD-10-CM | POA: Diagnosis not present

## 2016-12-22 MED ORDER — TRAMADOL HCL 50 MG PO TABS: 50.0000 mg | ORAL_TABLET | Freq: Every evening | ORAL | 1 refills | Status: DC | PRN

## 2016-12-22 NOTE — Patient Instructions (Signed)
Please call to make an earlier appointment if either your back or your hands started bothering you more

## 2016-12-22 NOTE — Progress Notes (Signed)
Subjective:    Patient ID: Charlene Patterson, female    DOB: 11-12-1945, 71 y.o.   MRN: [id number]  HPI   Chief complaint is low back pain which has been chronic  Doing well with PT Foot pain back and thigh pain all getting better Tramadol 50mg  po qhs Tylenol ES 2 qam and qpm  Still has some hand tingling . She states that this is not bad enough to pursue additional testing. She denies any hand weakness or numbness. Pain Inventory Average Pain 4 Pain Right Now 1 My pain is other  In the last 24 hours, has pain interfered with the following? General activity no answer Relation with others no answer Enjoyment of life no answer What TIME of day is your pain at its worst? morning and night Sleep (in general) NA  Pain is worse with: inactivity Pain improves with: no answer Relief from Meds: no answer  Mobility walk without assistance ability to climb steps?  yes do you drive?  yes  Function retired  Neuro/Psych tingling  Prior Studies Any changes since last visit?  no  Physicians involved in your care Any changes since last visit?  no   Family History  Problem Relation Age of Onset  . Heart disease Mother   . Heart disease Father   . Prostate cancer Father   . Diabetes Sister   . Stomach cancer Sister 30  . Diabetes Brother   . Heart disease Brother   . Rectal cancer Neg Hx   . Colon cancer Neg Hx    Social History   Social History  . Marital status: Widowed    Spouse name: N/A  . Number of children: 2  . Years of education: 12   Occupational History  . Retired    Social History Main Topics  . Smoking status: Never Smoker  . Smokeless tobacco: Never Used  . Alcohol use No  . Drug use: No  . Sexual activity: No   Other Topics Concern  . None   Social History Narrative   Regular exercise-no   Caffeine Use-yes   Past Surgical History:  Procedure Laterality Date  . ABDOMINAL HYSTERECTOMY  ~ 1978   "partial"  . APPENDECTOMY  ~ 1978  .  COLONOSCOPY  2005   last 2005  . POLYPECTOMY     Past Medical History:  Diagnosis Date  . Allergy   . Anxiety   . Arthritis    "left foot" (05/26/2014)  . Chronic back pain   . Chronic neck pain   . Depression   . GERD (gastroesophageal reflux disease)   . History of chicken pox   . History of colon polyps   . History of hiatal hernia   . Lower GI bleeding   . Migraines    "monthly usually; weekly lately" (05/26/2014)  . Sciatica   . Thoracic compression fracture ([facility])    BP 117/75   Pulse 74   SpO2 98%   Opioid Risk Score:   Fall Risk Score:  `1  Depression screen PHQ 2/9  Depression screen [facility] 2/9 11/16/2016 02/08/2016 02/08/2016 05/11/2015 07/16/2014 12/13/2012  Decreased Interest 1 0 0 0 2 0  Down, Depressed, Hopeless 1 0 0 0 0 2  PHQ - 2 Score 2 0 0 0 2 2  Altered sleeping 2 - - - 3 1  Tired, decreased energy 1 - - - 1 3  Change in appetite 1 - - - 3 3  Feeling bad or  failure about yourself  0 - - - 1 2  Trouble concentrating 1 - - - 1 3  Moving slowly or fidgety/restless 0 - - - 0 0  Suicidal thoughts 0 - - - 0 0  PHQ-9 Score 7 - - - 11 14  Difficult doing work/chores Not difficult at all - - - - -    Review of Systems  Constitutional: Negative.   HENT: Negative.   Eyes: Negative.   Respiratory: Negative.   Cardiovascular: Negative.   Gastrointestinal: Negative.   Endocrine: Negative.   Genitourinary: Negative.   Musculoskeletal: Negative.   Skin: Negative.   Allergic/Immunologic: Negative.   Neurological:       Tingling  Hematological: Negative.   Psychiatric/Behavioral: Negative.   All other systems reviewed and are negative.      Objective:   Physical Exam  Constitutional: She is oriented to person, place, and time. She appears well-developed and well-nourished. No distress.  HENT:  Head: Normocephalic and atraumatic.  Eyes: Conjunctivae and EOM are normal. Pupils are equal, round, and reactive to light.  Neck: Normal range of motion.    Musculoskeletal:       Right wrist: She exhibits normal range of motion, no tenderness, no effusion and no deformity.       Right hip: Normal.       Left hip: Normal.       Lumbar back: She exhibits normal range of motion, no tenderness, no deformity and no spasm.       Right hand: She exhibits normal range of motion. Decreased sensation noted. Decreased sensation is present in the ulnar distribution, is present in the medial distribution and is present in the radial distribution. Normal strength noted.  Neurological: She is alert and oriented to person, place, and time. No sensory deficit. She exhibits normal muscle tone. Coordination and gait normal.  Motor strength 5/5 bilateral deltoid, biceps, triceps, grip, hip flexor, knee extensor, ankle dorsiflexor, plantar flexor  Skin: She is not diaphoretic.  Psychiatric: She has a normal mood and affect.  Nursing note and vitals reviewed.   Normal ROM lumbar and cervical without pain      Assessment & Plan:  1.  Lumbar spondylosis stenosis and left L2-3 radiculopathy  improving  With PT We'll finish out physical therapy. Have emphasized need to continue with home exercise program on a daily basis. Cont tramadol 50mg  qhs No need for injections  Physical medicine and rehabilitation follow-up in 3 months or sooner if pain recurs  2.  CTS- ?improving . She has atypical findings all fingers feel numb. No other symptoms such as weakness. We'll hold off on further workup at this point. She will call if she wants an EMG/NCV scheduled. Will reevaluate next visit

## 2016-12-25 ENCOUNTER — Encounter: Payer: Self-pay | Admitting: Physical Therapy

## 2016-12-25 ENCOUNTER — Ambulatory Visit: Payer: Medicare HMO | Admitting: Physical Therapy

## 2016-12-25 DIAGNOSIS — G8929 Other chronic pain: Secondary | ICD-10-CM

## 2016-12-25 DIAGNOSIS — M545 Low back pain: Principal | ICD-10-CM

## 2016-12-25 NOTE — Therapy (Signed)
[place], [place], [address] Phone: [phone]   Fax:  [phone]  Physical Therapy Treatment  Patient Details  Name: Charlene Patterson MRN: [id number] Date of Birth: 01-22-1946 Referring Provider: Charlett Blake, MD  Encounter Date: 12/25/2016      PT End of Session - 12/25/16 1312    Visit Number 8   Number of Visits 10   Date for PT Re-Evaluation 12/29/16   PT Start Time 9323   PT Stop Time 1230   PT Time Calculation (min) 43 min   Activity Tolerance Patient tolerated treatment well   Behavior During Therapy [facility] for tasks assessed/performed      Past Medical History:  Diagnosis Date  . Allergy   . Anxiety   . Arthritis    "left foot" (05/26/2014)  . Chronic back pain   . Chronic neck pain   . Depression   . GERD (gastroesophageal reflux disease)   . History of chicken pox   . History of colon polyps   . History of hiatal hernia   . Lower GI bleeding   . Migraines    "monthly usually; weekly lately" (05/26/2014)  . Sciatica   . Thoracic compression fracture [facility])     Past Surgical History:  Procedure Laterality Date  . ABDOMINAL HYSTERECTOMY  ~ 1978   "partial"  . APPENDECTOMY  ~ 1978  . COLONOSCOPY  2005   last 2005  . POLYPECTOMY      There were no vitals filed for this visit.      Subjective Assessment - 12/25/16 1150    Subjective 2-3/10 in back.  I saw the Doctor and told him how well You all were doing.  [employer] Leaver work is going good.  I go to the MD in 3 onths.   Currently in Pain? Yes   Pain Score 3   2-3/10,    Pain Location Back   Pain Orientation Left   Pain Descriptors / Indicators Sore   Aggravating Factors  working out type of pain   Pain Relieving Factors rest exetcise, good mechanics, heel lift.m                         [facility] Adult PT Treatment/Exercise - 12/25/16 0001      Lumbar Exercises: Aerobic   UBE (Upper Arm Bike) retro 4 min      Lumbar Exercises: Supine   Other Supine Lumbar Exercises hip flx to 90, ball bw hands reach to ceiling, knees bend/ext     Knee/Hip Exercises: Stretches   Hip Flexor Stretch Limitations thomas test position 30s each     Knee/Hip Exercises: Aerobic   Nustep 5 minutes, L5     Knee/Hip Exercises: Machines for Strengthening   Cybex Leg Press 1,2,3 plates 10 x each   Total Gym Leg Press --     Knee/Hip Exercises: Standing   Side Lunges 5 sets  2 steps.  pole used for posture cues   Lateral Step Up Both;1 set;Hand Hold: 2;Step Height: 6"   Forward Step Up 10 reps;Both  6 inch step   Functional Squat 10 reps   Functional Squat Limitations decreased pain with moving left hip posterior to reduce pain 10 x     Knee/Hip Exercises: Seated   Sit to Sand 10 reps  first reps painful, yellow ball     Knee/Hip Exercises: Supine   Bridges Limitations 10   Other Supine  Knee/Hip Exercises mini squats with rod to assist posture 10 x some pain noted     Manual Therapy   Soft tissue mobilization illiopsoas strumming left  taught patient how to do                PT Education - 12/25/16 1312    Education provided Yes   Education Details how to do manual on [employer]) Educated Patient   Methods Explanation;Demonstration;Tactile cues;Verbal cues   Comprehension Verbalized understanding;Returned demonstration          PT Short Term Goals - 12/25/16 1316      PT SHORT TERM GOAL #1   Title Pt will be able to lift her great grand child with minimal discomfort in low back by 6/29   Baseline did not do lately   Time 4   Period Weeks   Status Unable to assess     PT SHORT TERM GOAL #2   Title Pt will be able to navigate stairs in a step-over-step pattern   Baseline able to do without pain in clinic   Time 4   Period Weeks   Status Unable to assess     PT SHORT TERM GOAL #3   Title 5/5 hip abduction for proper support to lumbopelvic region   Time 4   Period  Weeks   Status Unable to assess     PT SHORT TERM GOAL #4   Title FOTO to 53% ability to indicate significant improvement in functional ability   Status Unable to assess                  Plan - 12/25/16 1313    Clinical Impression Statement Continued strengthening and using body correctly.  Able to decrease back pain with mini squat  when she was cued to pull left hip back.  No pain at the end of session.  She has been using proper body mechanics at home.  She is independent with her HEP and she feels she knows what to do if she has a pain flare.    PT Treatment/Interventions ADLs/Self Care Home Management;Cryotherapy;Electrical Stimulation;Functional mobility training;Stair training;Gait training;Ultrasound;Traction;Moist Heat;Therapeutic activities;Therapeutic exercise;Balance training;Neuromuscular re-education;Patient/family education;Passive range of motion;Manual techniques;Dry needling;Taping   PT Next Visit Plan 1 more visit.  Patient thinks she will be ready for D/C after that visit.     PT Home Exercise Plan figure 4 stretch, thomas test stretch, walk with heel lift, contract relax for pelvis rotation. bridge on heels, clam, prone scapular retraction, cat/camel/child pose   Consulted and Agree with Plan of Care Patient      Patient will benefit from skilled therapeutic intervention in order to improve the following deficits and impairments:  Difficulty walking, Increased muscle spasms, Decreased activity tolerance, Pain, Improper body mechanics, Impaired flexibility, Decreased strength, Postural dysfunction  Visit Diagnosis: Chronic left-sided low back pain, with sciatica presence unspecified     Problem List Patient Active Problem List   Diagnosis Date Noted  . Routine general medical examination at a health care facility 02/08/2016  . Hypertriglyceridemia 05/11/2015  . Memory loss 05/11/2015  . Hyperlipidemia with target LDL less than 130 10/29/2014  . Frequent  PVCs 10/29/2014  . Vitamin D deficiency 09/24/2014  . Colitis 05/25/2014  . Allergic rhinitis 05/25/2014  . Spinal stenosis of lumbar region with radiculopathy 05/25/2014  . Cervical radiculitis 05/12/2014  . Left-sided low back pain with left-sided sciatica 05/12/2014  . Thoracic compression fracture ([place]) 05/12/2014  . Visit  for screening mammogram 05/10/2014  . GERD (gastroesophageal reflux disease) 12/13/2012  . Osteopenia of the elderly 12/13/2012  . Depression with somatization 12/13/2012    Terica Yogi PTA 12/25/2016, 1:20 PM  [facility] [address] [place], [place], [address] Phone: [phone]   Fax:  [phone]  Name: Charlene Patterson MRN: [id number] Date of Birth: 21-Sep-1945

## 2016-12-27 ENCOUNTER — Ambulatory Visit: Payer: Medicare HMO | Admitting: Physical Therapy

## 2016-12-27 ENCOUNTER — Encounter: Payer: Self-pay | Admitting: Physical Therapy

## 2016-12-27 DIAGNOSIS — M545 Low back pain: Principal | ICD-10-CM

## 2016-12-27 DIAGNOSIS — G8929 Other chronic pain: Secondary | ICD-10-CM

## 2016-12-27 NOTE — Therapy (Signed)
[place], [place], [address] Phone: [phone]   Fax:  [phone]  Physical Therapy Treatment/Discharge Summary  Patient Details  Name: Charlene Patterson MRN: [id number] Date of Birth: October 14, 1945 Referring Provider: Charlett Blake, MD  Encounter Date: 12/27/2016      PT End of Session - 12/27/16 1141    Visit Number 9   Number of Visits 10   Date for PT Re-Evaluation 12/29/16   Authorization Type [employer] MCR   PT Start Time 4801   PT Stop Time 1204   PT Time Calculation (min) 23 min   Activity Tolerance Patient tolerated treatment well   Behavior During Therapy [facility] for tasks assessed/performed      Past Medical History:  Diagnosis Date  . Allergy   . Anxiety   . Arthritis    "left foot" (05/26/2014)  . Chronic back pain   . Chronic neck pain   . Depression   . GERD (gastroesophageal reflux disease)   . History of chicken pox   . History of colon polyps   . History of hiatal hernia   . Lower GI bleeding   . Migraines    "monthly usually; weekly lately" (05/26/2014)  . Sciatica   . Thoracic compression fracture [facility])     Past Surgical History:  Procedure Laterality Date  . ABDOMINAL HYSTERECTOMY  ~ 1978   "partial"  . APPENDECTOMY  ~ 1978  . COLONOSCOPY  2005   last 2005  . POLYPECTOMY      There were no vitals filed for this visit.      Subjective Assessment - 12/27/16 1141    Subjective Reports back is doing good. I always have a little bit of pain.    Patient Stated Goals stairs, care for great grand son (1.37yr, wash dishes   Currently in Pain? Yes   Pain Score --  a little   Pain Location Back   Pain Orientation Left                         [facility] Adult PT Treatment/Exercise - 12/27/16 0001      Lumbar Exercises: Aerobic   Elliptical 5 min L1 ramp 10                PT Education - 12/27/16 1207    Education provided Yes   Education  Details use of eliptical/other gym equipment, progress toward goals, avoid lifting if it is too heavy, taking rest breaks/breaking up activities such as yard work    [employer] Educated Patient   Methods Explanation   Comprehension Verbalized understanding          PT Short Term Goals - 12/27/16 1152      PT SHORT TERM GOAL #1   Title Pt will be able to lift her great grand child with minimal discomfort in low back by 6/29   Baseline some discomfort, he is heavy   Status Not Met     PT SHORT TERM GOAL #2   Title Pt will be able to navigate stairs in a step-over-step pattern   Baseline able   Status Achieved     PT SHORT TERM GOAL #3   Title 5/5 hip abduction for proper support to lumbopelvic region   Baseline 5/5 bilaterally   Status Achieved     PT SHORT TERM GOAL #4   Title FOTO to 53% ability to indicate significant improvement in functional  ability   Baseline 61% at d/c   Status Achieved                  Plan - 01/14/17 1206    Clinical Impression Statement Pt has made significant progress since beginning PT and feels prepared for independent program. Discussed importance of continued HEP and balance of exercise and stretching. Pt was instructed to contact us with any further questions.    PT Treatment/Interventions ADLs/Self Care Home Management;Cryotherapy;Electrical Stimulation;Functional mobility training;Stair training;Gait training;Ultrasound;Traction;Moist Heat;Therapeutic activities;Therapeutic exercise;Balance training;Neuromuscular re-education;Patient/family education;Passive range of motion;Manual techniques;Dry needling;Taping   Consulted and Agree with Plan of Care Patient      Patient will benefit from skilled therapeutic intervention in order to improve the following deficits and impairments:  Difficulty walking, Increased muscle spasms, Decreased activity tolerance, Pain, Improper body mechanics, Impaired flexibility, Decreased strength, Postural  dysfunction  Visit Diagnosis: Chronic left-sided low back pain, with sciatica presence unspecified       G-Codes - 01-14-2017 08/28/1207    Functional Assessment Tool Used (Outpatient Only) FOTO 61% ability (goal 53%), clinical judgement   Functional Limitation Mobility: Walking and moving around   Mobility: Walking and Moving Around Goal Status [phone]) At least 20 percent but less than 40 percent impaired, limited or restricted   Mobility: Walking and Moving Around Discharge Status [phone]) At least 20 percent but less than 40 percent impaired, limited or restricted      Problem List Patient Active Problem List   Diagnosis Date Noted  . Routine general medical examination at a health care facility 02/08/2016  . Hypertriglyceridemia 05/11/2015  . Memory loss 05/11/2015  . Hyperlipidemia with target LDL less than 130 10/29/2014  . Frequent PVCs 10/29/2014  . Vitamin D deficiency 09/24/2014  . Colitis 05/25/2014  . Allergic rhinitis 05/25/2014  . Spinal stenosis of lumbar region with radiculopathy 05/25/2014  . Cervical radiculitis 05/12/2014  . Left-sided low back pain with left-sided sciatica 05/12/2014  . Thoracic compression fracture ([place]) 05/12/2014  . Visit for screening mammogram 05/10/2014  . GERD (gastroesophageal reflux disease) 12/13/2012  . Osteopenia of the elderly 12/13/2012  . Depression with somatization 12/13/2012    PHYSICAL THERAPY DISCHARGE SUMMARY  Visits from Start of Care: 9  Current functional level related to goals / functional outcomes: See above   Remaining deficits: See above   Education / Equipment: Anatomy of condition, POC, HEP, exercise form/rationale  Plan: Patient agrees to discharge.  Patient goals were partially met. Patient is being discharged due to being pleased with the current functional level.  ?????     Torres Hardenbrook C. Darik Massing PT, DPT 01/14/2017 12:10 PM   [facility] [address] [place], [place], [address] Phone: [phone]   Fax:  [phone]  Name: Charlene Patterson MRN: [id number] Date of Birth: [birth year]

## 2017-01-05 ENCOUNTER — Ambulatory Visit (INDEPENDENT_AMBULATORY_CARE_PROVIDER_SITE_OTHER): Payer: Medicare HMO | Admitting: Neurology

## 2017-01-05 ENCOUNTER — Encounter: Payer: Self-pay | Admitting: Neurology

## 2017-01-05 VITALS — BP 132/68 | HR 72 | Resp 16 | Ht 64.0 in | Wt 140.0 lb

## 2017-01-05 DIAGNOSIS — F339 Major depressive disorder, recurrent, unspecified: Secondary | ICD-10-CM | POA: Insufficient documentation

## 2017-01-05 DIAGNOSIS — R413 Other amnesia: Secondary | ICD-10-CM

## 2017-01-05 NOTE — Progress Notes (Signed)
NEUROLOGY FOLLOW UP OFFICE NOTE  Charlene Patterson [id number]  HISTORY OF PRESENT ILLNESS: I had the pleasure of seeing Charlene Patterson in follow-up in the neurology clinic on 01/05/2017.  The patient was last seen 9 months ago for worsening memory. MMSE in 04/2016 and 06/2015 were 30/30. She continued to report cognitive changes, including getting lost driving, missing bills and medications. She underwent Neuropsychological evaluation last December 2017 which was largely within normal limits. There was no evidence of a neurocognitive disorder. She endorsed significant anxiety and depression, cognitive complaints most likely due to these.   Since her last visit, she reports that things have leveled off, everything has been going great. She denies getting lost driving, no missed bills or medications. She has been keeping active in her garden, which is helping her mind as well. She reports less stress. She reports migraines are under control. No dizziness, vision changes, focal numbness/tingling/weakness, no falls. Back pain better with PT.   HPI 06/18/2015: This is a 71 yo RH woman with a history of hyperlipidemia, chronic back pain,who presented for worsening memory. She reports that her memory is "not too good," and started noticing changes gradually over the past few years. She reports problems mostly with short-term memory, sometimes she forgets to take her medications and her son would remind her. She misplaces things frequently and has been told that she repeats herself. She left the stove on a few times. She has to write everything down. She denies getting lost driving, and reports she can find her way back if she makes a wrong turn. She lives with her son with cerebral palsy, and denies any missed bill payments. They are planning to move in with her grandson's family soon.   She has a history of migraines since childhood, worse with weather changes and loud sounds and smells. She has occasional  dizziness and gets motion sick quickly. She denies any diplopia, blurred vision, dysarthria, dysphagia. She has right hand and arm numbness and tingling when driving. She has chronic neck and back pain, at times triggering her headaches. She has problems with her rectum, but denies any constipation. No anosmia or tremors. She became tearful reporting that her husband passed away in October 26, 2010, and reports mood is "up and down." She denies any history of head injuries or alcohol use. Her younger sister has memory problems and got lost driving at age 70.  Records and images were personally reviewed where available.  I personally reviewed MRI brain without contrast which was normal. TSH and B12 normal.  PAST MEDICAL HISTORY: Past Medical History:  Diagnosis Date  . Allergy   . Anxiety   . Arthritis    "left foot" (05/26/2014)  . Chronic back pain   . Chronic neck pain   . Depression   . GERD (gastroesophageal reflux disease)   . History of chicken pox   . History of colon polyps   . History of hiatal hernia   . Lower GI bleeding   . Migraines    "monthly usually; weekly lately" (05/26/2014)  . Sciatica   . Thoracic compression fracture [facility])     MEDICATIONS: Current Outpatient Prescriptions on File Prior to Visit  Medication Sig Dispense Refill  . acetaminophen (TYLENOL) 650 MG CR tablet Take 650 mg by mouth every 8 (eight) hours as needed for pain.    Marland Kitchen atorvastatin (LIPITOR) 40 MG tablet Take 1 tablet (40 mg total) by mouth daily. 90 tablet 3  . Calcium-Magnesium-Vitamin D (CALCIUM 1200+D3  PO) Take 2 capsules by mouth daily.    . citalopram (CELEXA) 10 MG tablet Take 1 tablet (10 mg total) by mouth daily. 90 tablet 3  . Ergocalciferol (DRISDOL PO) Take 3,000 Units by mouth daily.    . Ferrous Sulfate (RA IRON) 27 MG TABS Take 1 tablet by mouth as needed (low iron level).     . hydrocortisone-pramoxine (PROCTOFOAM HC) rectal foam Place 1 applicator rectally 2 (two) times daily. 10 g 2  .  loratadine (CLARITIN) 10 MG tablet Take 10 mg by mouth at bedtime as needed (helps with sleep and headaches).    . mirtazapine (REMERON) 7.5 MG tablet Take 1 tablet (7.5 mg total) by mouth at bedtime. 90 tablet 3  . pantoprazole (PROTONIX) 40 MG tablet Take 1 tablet (40 mg total) by mouth daily. 90 tablet 3  . traMADol (ULTRAM) 50 MG tablet Take 1 tablet (50 mg total) by mouth at bedtime as needed. 30 tablet 1  . vitamin B-12 (CYANOCOBALAMIN) 1000 MCG tablet Take 1,000 mcg by mouth daily.    . vitamin E 400 UNIT capsule Take 400 Units by mouth daily.     No current facility-administered medications on file prior to visit.     ALLERGIES: No Known Allergies  FAMILY HISTORY: Family History  Problem Relation Age of Onset  . Heart disease Mother   . Heart disease Father   . Prostate cancer Father   . Diabetes Sister   . Stomach cancer Sister 35  . Diabetes Brother   . Heart disease Brother   . Rectal cancer Neg Hx   . Colon cancer Neg Hx     SOCIAL HISTORY: Social History   Social History  . Marital status: Widowed    Spouse name: N/A  . Number of children: 2  . Years of education: 12   Occupational History  . Retired    Social History Main Topics  . Smoking status: Never Smoker  . Smokeless tobacco: Never Used  . Alcohol use No  . Drug use: No  . Sexual activity: No   Other Topics Concern  . Not on file   Social History Narrative   Regular exercise-no   Caffeine Use-yes    REVIEW OF SYSTEMS: Constitutional: No fevers, chills, or sweats, no generalized fatigue, change in appetite Eyes: No visual changes, double vision, eye pain Ear, nose and throat: No hearing loss, ear pain, nasal congestion, sore throat Cardiovascular: No chest pain, palpitations Respiratory:  No shortness of breath at rest or with exertion, wheezes GastrointestinaI: No nausea, vomiting, diarrhea, abdominal pain, fecal incontinence Genitourinary:  No dysuria, urinary retention or  frequency Musculoskeletal:  No neck pain, back pain Integumentary: No rash, pruritus, skin lesions Neurological: as above Psychiatric: No depression, insomnia, anxiety Endocrine: No palpitations, fatigue, diaphoresis, mood swings, change in appetite, change in weight, increased thirst Hematologic/Lymphatic:  No anemia, purpura, petechiae. Allergic/Immunologic: no itchy/runny eyes, nasal congestion, recent allergic reactions, rashes  PHYSICAL EXAM: Vitals:   01/05/17 1544  BP: 132/68  Pulse: 72  Resp: 16   General: No acute distress Head:  Normocephalic/atraumatic Neck: supple, no paraspinal tenderness, full range of motion Heart:  Regular rate and rhythm Lungs:  Clear to auscultation bilaterally Back: No paraspinal tenderness Skin/Extremities: No rash, no edema Neurological Exam: alert and oriented to person, place, and time. No aphasia or dysarthria. Fund of knowledge is appropriate.  Remote memory are intact. 0/3 delayed recall.  Attention and concentration are normal.    Able to name  objects and repeat phrases.  Cranial nerves: Pupils equal, round. Visual field full. No facial asymmetry. Tongue, uvula, palate midline.  Motor: 5/5 throughout with no pronator drift. Sensation intact to light touch. No incoordination on finger to nose. Gait narrow-based and steady, able to tandem walk adequately.  IMPRESSION: This is a 71 yo RH woman with hyperlipidemia who presented with worsening memory. Repeat MMSE done on previous visits were normal, she underwent Neuropsychological evaluation which was within normal, no evidence of underlying dementia. Cognitive complaints most likely due to depression and anxiety, which she continues to endorse. We discussed results, she reports she is doing great, has not noticed much cognitive symptoms. We discussed how stress can cause memory changes, continue to monitor, consider counseling if needed. We again discussed the importance of control of vascular risk  factors, physical exercise, and brain stimulation exercises for brain health. She will follow-up on an as needed basis and knows to call for any changes.   Thank you for allowing me to participate in her care.  Please do not hesitate to call for any questions or concerns.  The duration of this appointment visit was 15 minutes of face-to-face time with the patient.  Greater than 50% of this time was spent in counseling, explanation of diagnosis, planning of further management, and coordination of care.   Ellouise Newer, M.D.   CC: Dr. Ronnald Ramp

## 2017-01-05 NOTE — Patient Instructions (Signed)
Looking good! Continue to monitor stress, if it becomes too much, consider counseling. Continue control of cholesterol, exercise, and brain stimulation exercises for brain health. Follow-up on as needed basis, call for any changes.

## 2017-01-15 ENCOUNTER — Telehealth: Payer: Self-pay

## 2017-01-15 NOTE — Telephone Encounter (Signed)
Called and left a vm requesting patient call back to discuss f/u and if she would like to continue with prolia

## 2017-03-07 ENCOUNTER — Other Ambulatory Visit: Payer: Self-pay

## 2017-07-30 ENCOUNTER — Ambulatory Visit (INDEPENDENT_AMBULATORY_CARE_PROVIDER_SITE_OTHER): Payer: Medicare HMO | Admitting: Internal Medicine

## 2017-07-30 ENCOUNTER — Encounter: Payer: Self-pay | Admitting: Internal Medicine

## 2017-07-30 ENCOUNTER — Other Ambulatory Visit (INDEPENDENT_AMBULATORY_CARE_PROVIDER_SITE_OTHER): Payer: Medicare HMO

## 2017-07-30 VITALS — BP 110/70 | HR 74 | Temp 98.4°F | Resp 16 | Ht 64.0 in | Wt 142.2 lb

## 2017-07-30 DIAGNOSIS — E781 Pure hyperglyceridemia: Secondary | ICD-10-CM | POA: Diagnosis not present

## 2017-07-30 DIAGNOSIS — Z1159 Encounter for screening for other viral diseases: Secondary | ICD-10-CM

## 2017-07-30 DIAGNOSIS — Z0000 Encounter for general adult medical examination without abnormal findings: Secondary | ICD-10-CM | POA: Diagnosis not present

## 2017-07-30 DIAGNOSIS — S22000D Wedge compression fracture of unspecified thoracic vertebra, subsequent encounter for fracture with routine healing: Secondary | ICD-10-CM

## 2017-07-30 DIAGNOSIS — E785 Hyperlipidemia, unspecified: Secondary | ICD-10-CM

## 2017-07-30 DIAGNOSIS — D500 Iron deficiency anemia secondary to blood loss (chronic): Secondary | ICD-10-CM | POA: Diagnosis not present

## 2017-07-30 DIAGNOSIS — E559 Vitamin D deficiency, unspecified: Secondary | ICD-10-CM

## 2017-07-30 DIAGNOSIS — Z23 Encounter for immunization: Secondary | ICD-10-CM

## 2017-07-30 DIAGNOSIS — R739 Hyperglycemia, unspecified: Secondary | ICD-10-CM | POA: Diagnosis not present

## 2017-07-30 DIAGNOSIS — K219 Gastro-esophageal reflux disease without esophagitis: Secondary | ICD-10-CM | POA: Diagnosis not present

## 2017-07-30 DIAGNOSIS — M8000XA Age-related osteoporosis with current pathological fracture, unspecified site, initial encounter for fracture: Secondary | ICD-10-CM

## 2017-07-30 HISTORY — DX: Iron deficiency anemia secondary to blood loss (chronic): D50.0

## 2017-07-30 LAB — HEMOGLOBIN A1C: HEMOGLOBIN A1C: 5.6 % (ref 4.6–6.5)

## 2017-07-30 LAB — IBC PANEL
Iron: 65 ug/dL (ref 42–145)
SATURATION RATIOS: 15.6 % — AB (ref 20.0–50.0)
TRANSFERRIN: 298 mg/dL (ref 212.0–360.0)

## 2017-07-30 LAB — COMPREHENSIVE METABOLIC PANEL
ALK PHOS: 123 U/L — AB (ref 39–117)
ALT: 15 U/L (ref 0–35)
AST: 17 U/L (ref 0–37)
Albumin: 4.4 g/dL (ref 3.5–5.2)
BUN: 17 mg/dL (ref 6–23)
CHLORIDE: 105 meq/L (ref 96–112)
CO2: 28 meq/L (ref 19–32)
Calcium: 9.8 mg/dL (ref 8.4–10.5)
Creatinine, Ser: 1.06 mg/dL (ref 0.40–1.20)
GFR: 54.34 mL/min — AB (ref >60.00)
GLUCOSE: 88 mg/dL (ref 70–99)
POTASSIUM: 4.3 meq/L (ref 3.5–5.1)
Sodium: 141 mEq/L (ref 135–145)
Total Bilirubin: 0.4 mg/dL (ref 0.2–1.2)
Total Protein: 7.3 g/dL (ref 6.0–8.3)

## 2017-07-30 LAB — FERRITIN: Ferritin: 14 ng/mL (ref 10.0–291.0)

## 2017-07-30 LAB — CBC WITH DIFFERENTIAL/PLATELET
BASOS PCT: 0.3 % (ref 0.0–3.0)
Basophils Absolute: 0 10*3/uL (ref 0.0–0.1)
EOS PCT: 1.2 % (ref 0.0–5.0)
Eosinophils Absolute: 0.1 10*3/uL (ref 0.0–0.7)
HEMATOCRIT: 39.7 % (ref 36.0–46.0)
Hemoglobin: 13.5 g/dL (ref 12.0–15.0)
LYMPHS ABS: 2.1 10*3/uL (ref 0.7–4.0)
LYMPHS PCT: 45.3 % (ref 12.0–46.0)
MCHC: 34 g/dL (ref 30.0–36.0)
MCV: 87 fl (ref 78.0–100.0)
MONOS PCT: 8.6 % (ref 3.0–12.0)
Monocytes Absolute: 0.4 10*3/uL (ref 0.1–1.0)
NEUTROS ABS: 2 10*3/uL (ref 1.4–7.7)
NEUTROS PCT: 44.6 % (ref 43.0–77.0)
PLATELETS: 272 10*3/uL (ref 150.0–400.0)
RBC: 4.57 Mil/uL (ref 3.87–5.11)
RDW: 13.9 % (ref 11.5–15.5)
WBC: 4.5 10*3/uL (ref 4.0–10.5)

## 2017-07-30 LAB — LIPID PANEL
CHOL/HDL RATIO: 4
CHOLESTEROL: 186 mg/dL (ref 0–200)
HDL: 42.1 mg/dL (ref >39.00)
NonHDL: 143.4
TRIGLYCERIDES: 301 mg/dL — AB (ref 0.0–149.0)
VLDL: 60.2 mg/dL — ABNORMAL HIGH (ref 0.0–40.0)

## 2017-07-30 LAB — VITAMIN D 25 HYDROXY (VIT D DEFICIENCY, FRACTURES): VITD: 40.8 ng/mL (ref 30.00–100.00)

## 2017-07-30 LAB — LDL CHOLESTEROL, DIRECT: Direct LDL: 76 mg/dL

## 2017-07-30 LAB — TSH: TSH: 4.23 u[IU]/mL (ref 0.35–4.50)

## 2017-07-30 NOTE — Patient Instructions (Signed)

## 2017-07-30 NOTE — Progress Notes (Addendum)
Subjective:  Patient ID: Charlene Patterson, female    DOB: 1946-05-16  Age: 72 y.o. MRN: [id number]  CC: Hyperlipidemia and Annual Exam   HPI [name] presents for a CPX.  She has her usual complaints of mild intermittent constipation but is concerned recently because she has had increase in thirst and increased in appetite.  She has only gained 2 pounds.  She treats the constipation with over-the-counter remedies and gets symptom relief.  She denies abdominal pain, bright red blood per rectum, or melena.  Past Medical History:  Diagnosis Date  . Allergy   . Anxiety   . Arthritis    "left foot" (05/26/2014)  . Chronic back pain   . Chronic neck pain   . Depression   . GERD (gastroesophageal reflux disease)   . History of chicken pox   . History of colon polyps   . History of hiatal hernia   . Lower GI bleeding   . Migraines    "monthly usually; weekly lately" (05/26/2014)  . Sciatica   . Thoracic compression fracture [facility])    Past Surgical History:  Procedure Laterality Date  . ABDOMINAL HYSTERECTOMY  ~ 1978   "partial"  . APPENDECTOMY  ~ 1978  . COLONOSCOPY  2005   last 2005  . POLYPECTOMY      reports that  has never smoked. she has never used smokeless tobacco. She reports that she does not drink alcohol or use drugs. family history includes Diabetes in her brother and sister; Heart disease in her brother, father, and mother; Prostate cancer in her father; Stomach cancer (age of onset: 2) in her sister. No Known Allergies  Outpatient Medications Prior to Visit  Medication Sig Dispense Refill  . acetaminophen (TYLENOL) 650 MG CR tablet Take 650 mg by mouth every 8 (eight) hours as needed for pain.    Marland Kitchen atorvastatin (LIPITOR) 40 MG tablet Take 1 tablet (40 mg total) by mouth daily. 90 tablet 3  . Calcium-Magnesium-Vitamin D (CALCIUM 1200+D3 PO) Take 2 capsules by mouth daily.    . citalopram (CELEXA) 10 MG tablet Take 1 tablet (10 mg total) by mouth daily. 90  tablet 3  . Ergocalciferol (DRISDOL PO) Take 3,000 Units by mouth daily.    . Ferrous Sulfate (RA IRON) 27 MG TABS Take 1 tablet by mouth as needed (low iron level).     . hydrocortisone-pramoxine (PROCTOFOAM HC) rectal foam Place 1 applicator rectally 2 (two) times daily. 10 g 2  . loratadine (CLARITIN) 10 MG tablet Take 10 mg by mouth at bedtime as needed (helps with sleep and headaches).    . mirtazapine (REMERON) 7.5 MG tablet Take 1 tablet (7.5 mg total) by mouth at bedtime. 90 tablet 3  . Multiple Vitamin (MULTIVITAMIN) tablet Take 1 tablet by mouth daily.    . pantoprazole (PROTONIX) 40 MG tablet Take 1 tablet (40 mg total) by mouth daily. 90 tablet 3  . traMADol (ULTRAM) 50 MG tablet Take 1 tablet (50 mg total) by mouth at bedtime as needed. 30 tablet 1  . vitamin B-12 (CYANOCOBALAMIN) 1000 MCG tablet Take 1,000 mcg by mouth daily.    . vitamin E 400 UNIT capsule Take 400 Units by mouth daily.     No facility-administered medications prior to visit.     ROS Review of Systems  Constitutional: Positive for unexpected weight change. Negative for chills, diaphoresis, fatigue and fever.  HENT: Negative.   Eyes: Negative.   Respiratory: Negative.  Negative  for cough, chest tightness and wheezing.   Cardiovascular: Negative for chest pain, palpitations and leg swelling.  Gastrointestinal: Positive for constipation. Negative for abdominal pain, anal bleeding, blood in stool, diarrhea, rectal pain and vomiting.  Endocrine: Positive for polydipsia and polyphagia. Negative for polyuria.  Genitourinary: Negative.  Negative for decreased urine volume, difficulty urinating, dysuria and urgency.  Musculoskeletal: Negative.  Negative for arthralgias and myalgias.  Skin: Negative.  Negative for color change and rash.  Allergic/Immunologic: Negative.   Neurological: Negative.  Negative for dizziness, weakness, light-headedness, numbness and headaches.  Hematological: Negative for adenopathy. Does  not bruise/bleed easily.  Psychiatric/Behavioral: Negative.     Objective:  BP 110/70 (BP Location: Left Arm, Patient Position: Sitting, Cuff Size: Normal)   Pulse 74   Temp 98.4 F (36.9 C) (Oral)   Resp 16   Ht 5\' 4"  (1.626 m)   Wt 142 lb 4 oz (64.5 kg)   SpO2 96%   BMI 24.42 kg/m   BP Readings from Last 3 Encounters:  07/30/17 110/70  01/05/17 132/68  12/22/16 117/75    Wt Readings from Last 3 Encounters:  07/30/17 142 lb 4 oz (64.5 kg)  01/05/17 140 lb (63.5 kg)  10/03/16 148 lb (67.1 kg)    Physical Exam  Constitutional: She is oriented to person, place, and time. No distress.  HENT:  Mouth/Throat: Oropharynx is clear and moist. No oropharyngeal exudate.  Eyes: Conjunctivae are normal. Left eye exhibits no discharge. No scleral icterus.  Neck: Normal range of motion. Neck supple. No JVD present. No thyromegaly present.  Cardiovascular: Normal rate, regular rhythm and normal heart sounds. Exam reveals no gallop.  No murmur heard. Pulmonary/Chest: Effort normal and breath sounds normal. No respiratory distress. She has no wheezes. She has no rales.  Abdominal: Soft. Bowel sounds are normal. She exhibits no distension and no mass. There is no tenderness.  Musculoskeletal: Normal range of motion. She exhibits no edema, tenderness or deformity.  Lymphadenopathy:    She has no cervical adenopathy.  Neurological: She is alert and oriented to person, place, and time.  Skin: Skin is warm and dry. No rash noted. She is not diaphoretic. No erythema. No pallor.  Psychiatric: She has a normal mood and affect. Her behavior is normal. Judgment and thought content normal.  Vitals reviewed.   Lab Results  Component Value Date   WBC 4.5 07/30/2017   HGB 13.5 07/30/2017   HCT 39.7 07/30/2017   PLT 272.0 07/30/2017   GLUCOSE 88 07/30/2017   CHOL 186 07/30/2017   TRIG 301.0 (H) 07/30/2017   HDL 42.10 07/30/2017   LDLDIRECT 76.0 07/30/2017   ALT 15 07/30/2017   AST 17  07/30/2017   NA 141 07/30/2017   K 4.3 07/30/2017   CL 105 07/30/2017   CREATININE 1.06 07/30/2017   BUN 17 07/30/2017   CO2 28 07/30/2017   TSH 4.23 07/30/2017   HGBA1C 5.6 07/30/2017    Mr Lumbar Spine Wo Contrast  Result Date: 10/13/2016 CLINICAL DATA:  Chronic left-sided low back pain. Left-sided sciatica. Close compression fracture of the thoracic vertebra with routine healing, subsequent encounter. EXAM: MRI LUMBAR SPINE WITHOUT CONTRAST TECHNIQUE: Multiplanar, multisequence MR imaging of the lumbar spine was performed. No intravenous contrast was administered. COMPARISON:  Lumbar spine radiographs 10/03/2016. MRI of the lumbar spine 06/06/2014 FINDINGS: Segmentation: 5 non rib-bearing lumbar type vertebral bodies are present. Alignment: AP alignment is anatomic. Rightward curvature of the lumbar spine is centered at L2-3. Vertebrae: Edematous endplate marrow  changes have progressed on the left at L2-3. Marrow signal and vertebral body heights are otherwise normal. Conus medullaris: Extends to the T12-L1 level and appears normal. Paraspinal and other soft tissues: Limited imaging of the abdomen is unremarkable. There is no significant adenopathy. Disc levels: L1-2:  Negative. L2-3: A progressive leftward disc protrusion is present. Moderate left and mild right subarticular stenosis has progressed. Mild foraminal narrowing is evident bilaterally. L3-4: A leftward disc protrusion has progressed. Mild facet hypertrophy is worse as well. Mild subarticular and foraminal narrowing is evident bilaterally. L4-5: A broad-based disc protrusion is asymmetric to the right. There is progressive moderate subarticular narrowing bilaterally, right greater than left. Moderate right and mild left foraminal stenosis is present. L5-S1: Facet hypertrophy is worse on the left. Mild disc bulging is noted. Mild left subarticular narrowing has progressed. The foramina are patent. IMPRESSION: 1. Progressive multilevel  spondylosis of the lumbar spine. 2. Rightward scoliosis is centered at L2-3. 3. Progressive leftward disc protrusion at L2-3 results in moderate left and mild right subarticular stenosis. Mild foraminal narrowing is present bilaterally. 4. Mild subarticular and foraminal narrowing bilaterally at L3-4 with a progressive disc protrusion. 5. Progressive moderate subarticular narrowing bilaterally at L4-5 is worse on the right. 6. Moderate right and mild left foraminal narrowing at L4-5. 7. Progressive mild left subarticular narrowing at L5-S1. Electronically Signed   By: San Morelle M.D.   On: 10/13/2016 07:19    Assessment & Plan:   Harshika was seen today for hyperlipidemia and annual exam.  Diagnoses and all orders for this visit:  Need for immunization against influenza -     Flu vaccine HIGH DOSE PF (Fluzone High dose)  Gastroesophageal reflux disease without esophagitis- Her symptoms are well controlled with the PPI. -     CBC with Differential/Platelet; Future  Hypertriglyceridemia- Her triglycerides are mildly elevated but do not require medical intervention.  She agrees to work on her lifestyle modifications. -     Comprehensive metabolic panel; Future -     Lipid panel; Future  Hyperlipidemia with target LDL less than 130- She has achieved her LDL goal and is doing well on the statin. -     Lipid panel; Future -     TSH; Future  Open compression fracture of thoracic vertebra with routine healing, subsequent encounter- She has no persistent pain related to this.  She is due for a DEXA scan. -     DG Bone Density; Future  Vitamin D deficiency -     VITAMIN D 25 Hydroxy (Vit-D Deficiency, Fractures); Future -     DG Bone Density; Future  Age-related osteoporosis with current pathological fracture, initial encounter -     VITAMIN D 25 Hydroxy (Vit-D Deficiency, Fractures); Future -     DG Bone Density; Future  Iron deficiency anemia due to chronic blood loss- Improvement  noted.  She is no longer losing blood.  Her iron level is normal.  Will continue iron replacement therapy. -     IBC panel; Future -     Ferritin; Future  Need for hepatitis C screening test -     Hepatitis C antibody; Future  Hyperglycemia- Her A1c is mildly elevated but I do not think this is causing her symptoms.  Medical therapy is not indicated. -     Hemoglobin A1c; Future  Need for pneumococcal vaccination -     Pneumococcal polysaccharide vaccine 23-valent greater than or equal to 2yo subcutaneous/IM   I am having  Viola L. Hogsett maintain her Ferrous Sulfate, Calcium-Magnesium-Vitamin D (CALCIUM 1200+D3 PO), acetaminophen, vitamin B-12, vitamin E, loratadine, Ergocalciferol (DRISDOL PO), hydrocortisone-pramoxine, atorvastatin, citalopram, mirtazapine, pantoprazole, traMADol, and multivitamin.  No orders of the defined types were placed in this encounter.  See AVS for instructions about healthy living and anticipatory guidance.  Follow-up: Return in about 6 months (around 01/27/2018).  Scarlette Calico, MD

## 2017-07-31 ENCOUNTER — Encounter: Payer: Self-pay | Admitting: Internal Medicine

## 2017-07-31 LAB — HEPATITIS C ANTIBODY
HEP C AB: NONREACTIVE
SIGNAL TO CUT-OFF: 0.01 (ref <1.00)

## 2017-07-31 NOTE — Assessment & Plan Note (Signed)

## 2017-08-10 ENCOUNTER — Other Ambulatory Visit: Payer: Self-pay | Admitting: Internal Medicine

## 2017-08-10 DIAGNOSIS — K219 Gastro-esophageal reflux disease without esophagitis: Secondary | ICD-10-CM

## 2017-08-10 DIAGNOSIS — F450 Somatization disorder: Secondary | ICD-10-CM

## 2017-08-10 DIAGNOSIS — F32A Depression, unspecified: Secondary | ICD-10-CM

## 2017-08-10 DIAGNOSIS — E785 Hyperlipidemia, unspecified: Secondary | ICD-10-CM

## 2017-08-10 DIAGNOSIS — F329 Major depressive disorder, single episode, unspecified: Secondary | ICD-10-CM

## 2017-08-10 MED ORDER — ATORVASTATIN CALCIUM 40 MG PO TABS: 40.0000 mg | ORAL_TABLET | Freq: Every day | ORAL | 1 refills | Status: DC

## 2017-08-10 MED ORDER — CITALOPRAM HYDROBROMIDE 10 MG PO TABS: 10.0000 mg | ORAL_TABLET | Freq: Every day | ORAL | 1 refills | Status: DC

## 2017-08-10 MED ORDER — MIRTAZAPINE 7.5 MG PO TABS
7.5000 mg | ORAL_TABLET | Freq: Every day | ORAL | 1 refills | Status: DC
Start: 2017-08-10 — End: 2018-04-19

## 2017-08-10 MED ORDER — PANTOPRAZOLE SODIUM 40 MG PO TBEC: 40.0000 mg | DELAYED_RELEASE_TABLET | Freq: Every day | ORAL | 1 refills | Status: DC

## 2017-08-10 NOTE — Telephone Encounter (Signed)
Copied from [place]. Topic: Inquiry >> Aug 10, 2017 10:48 AM Pricilla Handler wrote: Reason for CRM: Patient called requesting refills of four medications ASAP:  Atorvastatin (LIPITOR) 40 MG tablet, Citalopram (CELEXA) 10 MG tablet, Mirtazapine (REMERON) 7.5 MG tablet, and Pantoprazole (PROTONIX) 40 MG tablet. Patient sates that she has been waiting for the last two weeks for these refills. Patient is almost completely out. Patient's preferred pharmacy is [facility], [place] [phone] (Phone) [phone] (Fax).       Thank You!!!

## 2017-08-10 NOTE — Telephone Encounter (Signed)
Reviewed chart pt is up-to-date sent refills to [facility]mb  

## 2017-08-27 ENCOUNTER — Ambulatory Visit (INDEPENDENT_AMBULATORY_CARE_PROVIDER_SITE_OTHER): Payer: Medicare HMO | Admitting: *Deleted

## 2017-08-27 VITALS — BP 118/74 | HR 77 | Resp 18 | Ht 64.0 in | Wt 144.0 lb

## 2017-08-27 DIAGNOSIS — Z1231 Encounter for screening mammogram for malignant neoplasm of breast: Secondary | ICD-10-CM | POA: Diagnosis not present

## 2017-08-27 DIAGNOSIS — E2839 Other primary ovarian failure: Secondary | ICD-10-CM | POA: Diagnosis not present

## 2017-08-27 DIAGNOSIS — Z0000 Encounter for general adult medical examination without abnormal findings: Secondary | ICD-10-CM | POA: Diagnosis not present

## 2017-08-27 NOTE — Patient Instructions (Addendum)
[facility] in [place], [place] in: [place] Address: [address], [place], [place] [address] Phone: [phone]  [facility] [place] [facility]ologist in [place], [place] Address: [address] [place], [place] [address] Hours:   Continue doing brain stimulating activities (puzzles, reading, adult coloring books, staying active) to keep memory sharp.   Continue to eat heart healthy diet (full of fruits, vegetables, whole grains, lean protein, water--limit salt, fat, and sugar intake) and increase physical activity as tolerated.    Ms. Phetteplace , Thank you for taking time to come for your Medicare Wellness Visit. I appreciate your ongoing commitment to your health goals. Please review the following plan we discussed and let me know if I can assist you in the future.   These are the goals we discussed: Goals    . Patient Stated     Increase my physical activity by walking more often, Continue worship God, love family and enjoy life.       This is a list of the screening recommended for you and due dates:  Health Maintenance  Topic Date Due  . Mammogram  09/05/2018  . Tetanus Vaccine  12/14/2022  . Colon Cancer Screening  08/13/2024  . Flu Shot  Completed  . DEXA scan (bone density measurement)  Completed  .  Hepatitis C: One time screening is recommended by Center for Disease Control  (CDC) for  adults born from [birth year] through 1964.   Completed  . Pneumonia vaccines  Completed

## 2017-08-27 NOTE — Progress Notes (Addendum)
Subjective:   Charlene Patterson is a 72 y.o. female who presents for Medicare Annual (Subsequent) preventive examination.  Review of Systems:  No ROS.  Medicare Wellness Visit. Additional risk factors are reflected in the social history.  Cardiac Risk Factors include: advanced age (>16men, >75 women);dyslipidemia Sleep patterns: feels rested on waking, gets up 1-2 times nightly to void and sleeps 6-7 hours nightly.    Home Safety/Smoke Alarms: Feels safe in home. Smoke alarms in place.  Living environment; residence and Firearm Safety: 2-story house, no firearms.Lives with family, no needs for DME, good support system Seat Belt Safety/Bike Helmet: Wears seat belt.     Objective:     Vitals: BP 118/74   Pulse 77   Resp 18   Ht 5\' 4"  (1.626 m)   Wt 144 lb (65.3 kg)   SpO2 98%   BMI 24.72 kg/m   Body mass index is 24.72 kg/m.  Advanced Directives 08/27/2017 12/22/2016 11/29/2016 11/16/2016 02/08/2016 08/10/2014 05/26/2014  Does Patient Have a Medical Advance Directive? No Yes No No Yes Yes Yes  Type of Advance Directive - [place];Living will - - [place];Living will [place];Living will Living will  Does patient want to make changes to medical advance directive? - - - - No - Patient declined - No - Patient declined  Copy of [place] in Chart? - No - copy requested - - Yes - No - copy requested  Would patient like information on creating a medical advance directive? Yes (ED - Information included in AVS) - - - - - -    Tobacco Social History   Tobacco Use  Smoking Status Never Smoker  Smokeless Tobacco Never Used     Counseling given: Not Answered     Past Medical History:  Diagnosis Date  . Allergy   . Anxiety   . Arthritis    "left foot" (05/26/2014)  . Chronic back pain   . Chronic neck pain   . Depression   . GERD (gastroesophageal reflux disease)   . History of chicken pox   . History  of colon polyps   . History of hiatal hernia   . Lower GI bleeding   . Migraines    "monthly usually; weekly lately" (05/26/2014)  . Sciatica   . Thoracic compression fracture [facility])    Past Surgical History:  Procedure Laterality Date  . ABDOMINAL HYSTERECTOMY  ~ 1978   "partial"  . APPENDECTOMY  ~ 1978  . COLONOSCOPY  2005   last 2005  . POLYPECTOMY     Family History  Problem Relation Age of Onset  . Heart disease Mother   . Heart disease Father   . Prostate cancer Father   . Diabetes Sister   . Stomach cancer Sister 34  . Diabetes Brother   . Heart disease Brother   . Rectal cancer Neg Hx   . Colon cancer Neg Hx    Social History   Socioeconomic History  . Marital status: Widowed    Spouse name: None  . Number of children: 2  . Years of education: 40  . Highest education level: None  Social Needs  . Financial resource strain: Not very hard  . Food insecurity - worry: Never true  . Food insecurity - inability: Never true  . Transportation needs - medical: No  . Transportation needs - non-medical: No  Occupational History  . Occupation: Retired  Tobacco Use  .  Smoking status: Never Smoker  . Smokeless tobacco: Never Used  Substance and Sexual Activity  . Alcohol use: No    Alcohol/week: 0.0 oz  . Drug use: No  . Sexual activity: No  Other Topics Concern  . None  Social History Narrative   Regular exercise-no   Caffeine Use-yes    Outpatient Encounter Medications as of 08/27/2017  Medication Sig  . acetaminophen (TYLENOL) 650 MG CR tablet Take 650 mg by mouth every 8 (eight) hours as needed for pain.  Marland Kitchen atorvastatin (LIPITOR) 40 MG tablet Take 1 tablet (40 mg total) by mouth daily.  . Calcium-Magnesium-Vitamin D (CALCIUM 1200+D3 PO) Take 2 capsules by mouth daily.  . citalopram (CELEXA) 10 MG tablet Take 1 tablet (10 mg total) by mouth daily.  . Ergocalciferol (DRISDOL PO) Take 3,000 Units by mouth daily.  . Ferrous Sulfate (RA IRON) 27 MG TABS Take 1  tablet by mouth as needed (low iron level).   . hydrocortisone-pramoxine (PROCTOFOAM HC) rectal foam Place 1 applicator rectally 2 (two) times daily.  Marland Kitchen loratadine (CLARITIN) 10 MG tablet Take 10 mg by mouth at bedtime as needed (helps with sleep and headaches).  . mirtazapine (REMERON) 7.5 MG tablet Take 1 tablet (7.5 mg total) by mouth at bedtime.  . Multiple Vitamin (MULTIVITAMIN) tablet Take 1 tablet by mouth daily.  . pantoprazole (PROTONIX) 40 MG tablet Take 1 tablet (40 mg total) by mouth daily.  . traMADol (ULTRAM) 50 MG tablet Take 1 tablet (50 mg total) by mouth at bedtime as needed.  . vitamin B-12 (CYANOCOBALAMIN) 1000 MCG tablet Take 1,000 mcg by mouth daily.  . vitamin E 400 UNIT capsule Take 400 Units by mouth daily.   No facility-administered encounter medications on file as of 08/27/2017.     Activities of Daily Living In your present state of health, do you have any difficulty performing the following activities: 08/27/2017 07/31/2017  Hearing? Y N  Vision? N N  Difficulty concentrating or making decisions? Y N  Walking or climbing stairs? N N  Dressing or bathing? N N  Doing errands, shopping? N N  Preparing Food and eating ? N -  Using the Toilet? N -  In the past six months, have you accidently leaked urine? N -  Do you have problems with loss of bowel control? N -  Managing your Medications? N -  Managing your Finances? N -  Housekeeping or managing your Housekeeping? N -  Some recent data might be hidden    Patient Care Team: Janith Lima, MD as PCP - General (Internal Medicine)    Assessment:   This is a routine wellness examination for Orwin. Physical assessment deferred to PCP.   Exercise Activities and Dietary recommendations Current Exercise Habits: The patient does not participate in regular exercise at present(chair exercise pamphlets provided), Exercise limited by: orthopedic condition(s)  Diet (meal preparation, eat out, water intake,  caffeinated beverages, dairy products, fruits and vegetables): in general, a "healthy" diet  , on average, 1-2 meals per day   Discussed drinking nutritional supplements when appetite is poor (coupons provided) Reviewed heart healthy diet, encouraged patient to increase daily water intake. Relevant patient education assigned to patient using Emmi. Goals    . Patient Stated     Increase my physical activity by walking more often, Continue worship God, love family and enjoy life.       Fall Risk Fall Risk  08/27/2017 07/31/2017 01/05/2017 11/16/2016 04/17/2016  Falls in the past  year? No No No No No    Depression Screen PHQ 2/9 Scores 08/27/2017 07/31/2017 11/16/2016 02/08/2016  PHQ - 2 Score 3 0 2 0  PHQ- 9 Score 7 - 7 -     Cognitive Function MMSE - Mini Mental State Exam 08/27/2017 04/17/2016 06/18/2015  Orientation to time 5 5 5   Orientation to Place 5 5 5   Registration 3 3 3   Attention/ Calculation 5 5 5   Recall 2 3 3   Language- name 2 objects 2 2 2   Language- repeat 1 1 1   Language- follow 3 step command 3 3 3   Language- read & follow direction 1 1 1   Write a sentence 1 1 1   Copy design 1 1 1   Total score 29 30 30         Immunization History  Administered Date(s) Administered  . Influenza, High Dose Seasonal PF 07/30/2017  . Influenza,inj,Quad PF,6+ Mos 04/14/2014, 05/11/2015  . Influenza-Unspecified 04/02/2012  . Pneumococcal Conjugate-13 05/11/2015  . Pneumococcal Polysaccharide-23 12/13/2012, 07/30/2017  . Tdap 12/13/2012   Screening Tests Health Maintenance  Topic Date Due  . MAMMOGRAM  09/05/2018  . TETANUS/TDAP  12/14/2022  . COLONOSCOPY  08/13/2024  . INFLUENZA VACCINE  Completed  . DEXA SCAN  Completed  . Hepatitis C Screening  Completed  . PNA vac Low Risk Adult  Completed      Plan:      I have personally reviewed and noted the following in the patient's chart:   . Medical and social history . Use of alcohol, tobacco or illicit drugs  . Current  medications and supplements . Functional ability and status . Nutritional status . Physical activity . Advanced directives . List of other physicians . Vitals . Screenings to include cognitive, depression, and falls . Referrals and appointments  In addition, I have reviewed and discussed with patient certain preventive protocols, quality metrics, and best practice recommendations. A written personalized care plan for preventive services as well as general preventive health recommendations were provided to patient.     Michiel Cowboy, RN  08/27/2017   Medical screening examination/treatment/procedure(s) were performed by non-physician practitioner and as supervising physician I was immediately available for consultation/collaboration. I agree with above. Scarlette Calico, MD

## 2017-09-26 ENCOUNTER — Ambulatory Visit
Admission: RE | Admit: 2017-09-26 | Discharge: 2017-09-26 | Disposition: A | Discharge status: Home / Self Care | Payer: Medicare HMO | Source: Ambulatory Visit | Attending: Internal Medicine | Admitting: Internal Medicine

## 2017-09-26 DIAGNOSIS — E2839 Other primary ovarian failure: Secondary | ICD-10-CM

## 2017-09-26 DIAGNOSIS — Z1231 Encounter for screening mammogram for malignant neoplasm of breast: Secondary | ICD-10-CM | POA: Diagnosis not present

## 2017-09-26 LAB — HM MAMMOGRAPHY

## 2017-09-27 ENCOUNTER — Ambulatory Visit
Admission: RE | Admit: 2017-09-27 | Discharge: 2017-09-27 | Disposition: A | Discharge status: Home / Self Care | Payer: Medicare HMO | Source: Ambulatory Visit | Attending: Internal Medicine | Admitting: Internal Medicine

## 2017-09-27 DIAGNOSIS — M8589 Other specified disorders of bone density and structure, multiple sites: Secondary | ICD-10-CM | POA: Diagnosis not present

## 2017-09-27 DIAGNOSIS — Z780 Asymptomatic menopausal state: Secondary | ICD-10-CM | POA: Diagnosis not present

## 2017-10-01 LAB — HM DEXA SCAN

## 2017-10-23 ENCOUNTER — Ambulatory Visit (HOSPITAL_COMMUNITY)
Admission: EM | Admit: 2017-10-23 | Discharge: 2017-10-23 | Disposition: A | Discharge status: Home / Self Care | Payer: Medicare HMO | Attending: Family Medicine | Admitting: Family Medicine

## 2017-10-23 ENCOUNTER — Encounter (HOSPITAL_COMMUNITY): Payer: Self-pay | Admitting: Emergency Medicine

## 2017-10-23 DIAGNOSIS — R21 Rash and other nonspecific skin eruption: Secondary | ICD-10-CM

## 2017-10-23 MED ORDER — PREDNISONE 10 MG (21) PO TBPK: ORAL_TABLET | Freq: Every day | ORAL | 0 refills | Status: DC

## 2017-10-23 MED ORDER — METHYLPREDNISOLONE SODIUM SUCC 125 MG IJ SOLR
INTRAMUSCULAR | Status: AC
  Filled 2017-10-23: qty 2

## 2017-10-23 MED ORDER — METHYLPREDNISOLONE SODIUM SUCC 125 MG IJ SOLR
80.0000 mg | Freq: Once | INTRAMUSCULAR | Status: AC
  Administered 2017-10-23: 80 mg via INTRAMUSCULAR

## 2017-10-23 MED ORDER — TRIAMCINOLONE ACETONIDE 0.5 % EX CREA
1.0000 | TOPICAL_CREAM | Freq: Three times a day (TID) | CUTANEOUS | 0 refills | Status: DC
Start: 2017-10-23 — End: 2019-02-05

## 2017-10-23 NOTE — ED Provider Notes (Signed)
[place]    CSN: [id number] Arrival date & time: 10/23/17  1203     History   Chief Complaint Chief Complaint  Patient presents with  . Rash    HPI Charlene Patterson is a 72 y.o. female.   72 year old female comes in for 4 day history of pruritic rash after working out in the yard. States with some blisters to the right elbow. Denies pain. Does have erythema around the rash, though unsure if warmth. Denies fever, chills, night sweats. Has been using benadryl cream as well as taking benadryl without improvement. Denies history of DM.      Past Medical History:  Diagnosis Date  . Allergy   . Anxiety   . Arthritis    "left foot" (05/26/2014)  . Chronic back pain   . Chronic neck pain   . Depression   . GERD (gastroesophageal reflux disease)   . History of chicken pox   . History of colon polyps   . History of hiatal hernia   . Lower GI bleeding   . Migraines    "monthly usually; weekly lately" (05/26/2014)  . Sciatica   . Thoracic compression fracture [facility])     Patient Active Problem List   Diagnosis Date Noted  . Iron deficiency anemia due to chronic blood loss 07/30/2017  . Need for hepatitis C screening test 07/30/2017  . Hyperglycemia 07/30/2017  . Episode of recurrent major depressive disorder ([place]) 01/05/2017  . Routine general medical examination at a health care facility 02/08/2016  . Hypertriglyceridemia 05/11/2015  . Memory loss 05/11/2015  . Hyperlipidemia with target LDL less than 130 10/29/2014  . Frequent PVCs 10/29/2014  . Vitamin D deficiency 09/24/2014  . Colitis 05/25/2014  . Allergic rhinitis 05/25/2014  . Spinal stenosis of lumbar region with radiculopathy 05/25/2014  . Cervical radiculitis 05/12/2014  . Thoracic compression fracture ([place]) 05/12/2014  . Visit for screening mammogram 05/10/2014  . GERD (gastroesophageal reflux disease) 12/13/2012  . Osteoporosis 12/13/2012  . Depression with somatization 12/13/2012    Past  Surgical History:  Procedure Laterality Date  . ABDOMINAL HYSTERECTOMY  ~ 1978   "partial"  . APPENDECTOMY  ~ 1978  . COLONOSCOPY  2005   last 2005  . POLYPECTOMY      OB History   None      Home Medications    Prior to Admission medications   Medication Sig Start Date End Date Taking? Authorizing Provider  acetaminophen (TYLENOL) 650 MG CR tablet Take 650 mg by mouth every 8 (eight) hours as needed for pain.    [provider]  atorvastatin (LIPITOR) 40 MG tablet Take 1 tablet (40 mg total) by mouth daily. 08/10/17   Janith Lima, MD  Calcium-Magnesium-Vitamin D (CALCIUM 1200+D3 PO) Take 2 capsules by mouth daily.    [provider]  citalopram (CELEXA) 10 MG tablet Take 1 tablet (10 mg total) by mouth daily. 08/10/17   Janith Lima, MD  Ergocalciferol (DRISDOL PO) Take 3,000 Units by mouth daily.    [provider]  Ferrous Sulfate (RA IRON) 27 MG TABS Take 1 tablet by mouth as needed (low iron level).     [provider]  hydrocortisone-pramoxine (PROCTOFOAM HC) rectal foam Place 1 applicator rectally 2 (two) times daily. 05/11/15   Janith Lima, MD  loratadine (CLARITIN) 10 MG tablet Take 10 mg by mouth at bedtime as needed (helps with sleep and headaches).    [provider]  mirtazapine (REMERON) 7.5 MG tablet Take 1 tablet (7.5 mg total) by mouth at bedtime. 08/10/17   Janith Lima, MD  Multiple Vitamin (MULTIVITAMIN) tablet Take 1 tablet by mouth daily.    [provider]  pantoprazole (PROTONIX) 40 MG tablet Take 1 tablet (40 mg total) by mouth daily. 08/10/17   Janith Lima, MD  predniSONE (STERAPRED UNI-PAK 21 TAB) 10 MG (21) TBPK tablet Take by mouth daily. Take 6 tabs by mouth day 1, then 5 tabs, then 4 tabs, then 3 tabs, 2 tabs, then 1 tab for the last day 10/23/17   Ok Edwards, PA-C  traMADol (ULTRAM) 50 MG tablet Take 1 tablet (50 mg total) by mouth at bedtime as needed. 12/22/16   Kirsteins, Luanna Salk, MD    triamcinolone cream (KENALOG) 0.5 % Apply 1 application topically 3 (three) times daily. 10/23/17   Tasia Catchings, Bayron Dalto V, PA-C  vitamin B-12 (CYANOCOBALAMIN) 1000 MCG tablet Take 1,000 mcg by mouth daily.    [provider]  vitamin E 400 UNIT capsule Take 400 Units by mouth daily.    [provider]    Family History Family History  Problem Relation Age of Onset  . Heart disease Mother   . Heart disease Father   . Prostate cancer Father   . Diabetes Sister   . Stomach cancer Sister 48  . Diabetes Brother   . Heart disease Brother   . Rectal cancer Neg Hx   . Colon cancer Neg Hx     Social History Social History   Tobacco Use  . Smoking status: Never Smoker  . Smokeless tobacco: Never Used  Substance Use Topics  . Alcohol use: No    Alcohol/week: 0.0 oz  . Drug use: No     Allergies   Patient has no known allergies.   Review of Systems Review of Systems  Reason unable to perform ROS: See HPI as above.     Physical Exam Triage Vital Signs ED Triage Vitals [10/23/17 1304]  Enc Vitals Group     BP 129/85     Pulse Rate 76     Resp      Temp 98.2 F (36.8 C)     Temp Source Oral     SpO2 98 %     Weight      Height      Head Circumference      Peak Flow      Pain Score      Pain Loc      Pain Edu?      Excl. in [place]?    No data found.  Updated Vital Signs BP 129/85 (BP Location: Right Arm)   Pulse 76   Temp 98.2 F (36.8 C) (Oral)   SpO2 98%   Physical Exam  Constitutional: She is oriented to person, place, and time. She appears well-developed and well-nourished. No distress.  HENT:  Head: Normocephalic and atraumatic.  Eyes: Pupils are equal, round, and reactive to light. Conjunctivae are normal.  Neurological: She is alert and oriented to person, place, and time.  Skin:  See picture below. Vesicular rash to the right elbow with erythema without increased warmth. Similar rash around right hip, left elbow, upper chest. No tenderness to  palpation.          UC Treatments / Results  Labs (all labs ordered are listed, but only abnormal results are displayed) Labs Reviewed - No data to display  EKG None Radiology  No results found.  Procedures Procedures (including critical care time)  Medications Ordered in UC Medications  methylPREDNISolone sodium succinate (SOLU-MEDROL) 125 mg/2 mL injection 80 mg (80 mg Intramuscular Given 10/23/17 1333)     Initial Impression / Assessment and Plan / UC Course  I have reviewed the triage vital signs and the nursing notes.  Pertinent labs & imaging results that were available during my care of the patient were reviewed by me and considered in my medical decision making (see chart for details).    Discussed possible poison ivy dermatitis causing symptoms. Will give solumedrol injection in office. Patient to start prednisone as directed. Kenalog cream as needed. Patient to add on 2nd generation antihistamine for itching. Return precautions given. Patient expresses understanding and agrees to plan.   Final Clinical Impressions(s) / UC Diagnoses   Final diagnoses:  Rash    ED Discharge Orders        Ordered    triamcinolone cream (KENALOG) 0.5 %  3 times daily     10/23/17 1319    predniSONE (STERAPRED UNI-PAK 21 TAB) 10 MG (21) TBPK tablet  Daily     10/23/17 1319       Ok Edwards, PA-C 10/23/17 1355

## 2017-10-23 NOTE — Discharge Instructions (Signed)
Your rash can be due to poison ivy. Solumedrol injection in office today. Prednisone as directed. Kenalog to help the symptoms. Please add over the counter allergy medicine such as zyrtec, claritin to help with itching. You can continue benadryl. Refrain from itching. Monitor for spreading redness, increased warmth, pain, fever, follow up for reevaluation needed.

## 2017-10-23 NOTE — ED Triage Notes (Signed)
C/o itchy rash onset Friday

## 2017-11-20 DIAGNOSIS — H25813 Combined forms of age-related cataract, bilateral: Secondary | ICD-10-CM | POA: Diagnosis not present

## 2017-11-20 DIAGNOSIS — H01021 Squamous blepharitis right upper eyelid: Secondary | ICD-10-CM | POA: Diagnosis not present

## 2017-11-20 DIAGNOSIS — H10413 Chronic giant papillary conjunctivitis, bilateral: Secondary | ICD-10-CM | POA: Diagnosis not present

## 2017-11-20 DIAGNOSIS — H33329 Round hole, unspecified eye: Secondary | ICD-10-CM | POA: Diagnosis not present

## 2017-11-20 DIAGNOSIS — H01025 Squamous blepharitis left lower eyelid: Secondary | ICD-10-CM | POA: Diagnosis not present

## 2017-11-20 DIAGNOSIS — H01022 Squamous blepharitis right lower eyelid: Secondary | ICD-10-CM | POA: Diagnosis not present

## 2017-11-20 DIAGNOSIS — H01024 Squamous blepharitis left upper eyelid: Secondary | ICD-10-CM | POA: Diagnosis not present

## 2017-11-22 ENCOUNTER — Encounter (INDEPENDENT_AMBULATORY_CARE_PROVIDER_SITE_OTHER): Payer: Medicare HMO | Admitting: Ophthalmology

## 2017-11-22 DIAGNOSIS — H33021 Retinal detachment with multiple breaks, right eye: Secondary | ICD-10-CM

## 2017-11-22 DIAGNOSIS — H43813 Vitreous degeneration, bilateral: Secondary | ICD-10-CM | POA: Diagnosis not present

## 2017-11-22 DIAGNOSIS — H2513 Age-related nuclear cataract, bilateral: Secondary | ICD-10-CM

## 2017-11-29 ENCOUNTER — Encounter (INDEPENDENT_AMBULATORY_CARE_PROVIDER_SITE_OTHER): Payer: Medicare HMO | Admitting: Ophthalmology

## 2017-11-29 DIAGNOSIS — H33301 Unspecified retinal break, right eye: Secondary | ICD-10-CM

## 2018-01-12 ENCOUNTER — Ambulatory Visit (HOSPITAL_COMMUNITY)
Admission: EM | Admit: 2018-01-12 | Discharge: 2018-01-12 | Disposition: A | Discharge status: Home / Self Care | Payer: Medicare HMO | Attending: Internal Medicine | Admitting: Internal Medicine

## 2018-01-12 ENCOUNTER — Encounter (HOSPITAL_COMMUNITY): Payer: Self-pay | Admitting: Emergency Medicine

## 2018-01-12 DIAGNOSIS — R21 Rash and other nonspecific skin eruption: Secondary | ICD-10-CM

## 2018-01-12 MED ORDER — VALACYCLOVIR HCL 1 G PO TABS: 1000.0000 mg | ORAL_TABLET | Freq: Three times a day (TID) | ORAL | 0 refills | Status: AC

## 2018-01-12 NOTE — Discharge Instructions (Signed)
Please begin valtrex for possible shingles; could possibly be an allergic reaction  Please take anti-histamines- daily allergy pill- zyrtec or Claritin, or benadryl  May apply hydrocortisone cream twice daily in thin amount to help with itching   Follow up if worsening or changing

## 2018-01-12 NOTE — ED Provider Notes (Signed)
[place]    CSN: [id number] Arrival date & time: 01/12/18  1315     History   Chief Complaint Chief Complaint  Patient presents with  . Rash    Possible Shingles    HPI Charlene Patterson is a 72 y.o. female history of arthritis, osteoporosis, hyperlipidemia, GERD, presenting today for evaluation of a rash.  She noticed this rash last evening.  Is associated with pain near her right hip as well as some itching.  She is unsure of any new exposures, denies any known new exposures of food, medications, compliance, hygiene products.  She has tried some Benadryl.  HPI  Past Medical History:  Diagnosis Date  . Allergy   . Anxiety   . Arthritis    "left foot" (05/26/2014)  . Chronic back pain   . Chronic neck pain   . Depression   . GERD (gastroesophageal reflux disease)   . History of chicken pox   . History of colon polyps   . History of hiatal hernia   . Lower GI bleeding   . Migraines    "monthly usually; weekly lately" (05/26/2014)  . Sciatica   . Thoracic compression fracture [facility])     Patient Active Problem List   Diagnosis Date Noted  . Iron deficiency anemia due to chronic blood loss 07/30/2017  . Need for hepatitis C screening test 07/30/2017  . Hyperglycemia 07/30/2017  . Episode of recurrent major depressive disorder ([place]) 01/05/2017  . Routine general medical examination at a health care facility 02/08/2016  . Hypertriglyceridemia 05/11/2015  . Memory loss 05/11/2015  . Hyperlipidemia with target LDL less than 130 10/29/2014  . Frequent PVCs 10/29/2014  . Vitamin D deficiency 09/24/2014  . Colitis 05/25/2014  . Allergic rhinitis 05/25/2014  . Spinal stenosis of lumbar region with radiculopathy 05/25/2014  . Cervical radiculitis 05/12/2014  . Thoracic compression fracture ([place]) 05/12/2014  . Visit for screening mammogram 05/10/2014  . GERD (gastroesophageal reflux disease) 12/13/2012  . Osteoporosis 12/13/2012  . Depression with  somatization 12/13/2012    Past Surgical History:  Procedure Laterality Date  . ABDOMINAL HYSTERECTOMY  ~ 1978   "partial"  . APPENDECTOMY  ~ 1978  . COLONOSCOPY  2005   last 2005  . POLYPECTOMY      OB History   None      Home Medications    Prior to Admission medications   Medication Sig Start Date End Date Taking? Authorizing Provider  acetaminophen (TYLENOL) 650 MG CR tablet Take 650 mg by mouth every 8 (eight) hours as needed for pain.    [provider]  atorvastatin (LIPITOR) 40 MG tablet Take 1 tablet (40 mg total) by mouth daily. 08/10/17   Janith Lima, MD  Calcium-Magnesium-Vitamin D (CALCIUM 1200+D3 PO) Take 2 capsules by mouth daily.    [provider]  citalopram (CELEXA) 10 MG tablet Take 1 tablet (10 mg total) by mouth daily. 08/10/17   Janith Lima, MD  Ergocalciferol (DRISDOL PO) Take 3,000 Units by mouth daily.    [provider]  Ferrous Sulfate (RA IRON) 27 MG TABS Take 1 tablet by mouth as needed (low iron level).     [provider]  hydrocortisone-pramoxine (PROCTOFOAM HC) rectal foam Place 1 applicator rectally 2 (two) times daily. 05/11/15   Janith Lima, MD  loratadine (CLARITIN) 10 MG tablet Take 10 mg by mouth at bedtime as needed (helps with sleep and headaches).    [provider]  mirtazapine (REMERON) 7.5 MG tablet Take 1 tablet (7.5 mg total) by mouth at bedtime. 08/10/17   Janith Lima, MD  Multiple Vitamin (MULTIVITAMIN) tablet Take 1 tablet by mouth daily.    [provider]  pantoprazole (PROTONIX) 40 MG tablet Take 1 tablet (40 mg total) by mouth daily. 08/10/17   Janith Lima, MD  predniSONE (STERAPRED UNI-PAK 21 TAB) 10 MG (21) TBPK tablet Take by mouth daily. Take 6 tabs by mouth day 1, then 5 tabs, then 4 tabs, then 3 tabs, 2 tabs, then 1 tab for the last day Patient not taking: Reported on 01/12/2018 10/23/17   Ok Edwards, PA-C  traMADol (ULTRAM) 50 MG tablet Take 1 tablet (50 mg  total) by mouth at bedtime as needed. 12/22/16   Kirsteins, Luanna Salk, MD  triamcinolone cream (KENALOG) 0.5 % Apply 1 application topically 3 (three) times daily. 10/23/17   Tasia Catchings, Amy V, PA-C  valACYclovir (VALTREX) 1000 MG tablet Take 1 tablet (1,000 mg total) by mouth 3 (three) times daily for 7 days. 01/12/18 01/19/18  Kieana Livesay C, PA-C  vitamin B-12 (CYANOCOBALAMIN) 1000 MCG tablet Take 1,000 mcg by mouth daily.    [provider]  vitamin E 400 UNIT capsule Take 400 Units by mouth daily.    [provider]    Family History Family History  Problem Relation Age of Onset  . Heart disease Mother   . Heart disease Father   . Prostate cancer Father   . Diabetes Sister   . Stomach cancer Sister 54  . Diabetes Brother   . Heart disease Brother   . Rectal cancer Neg Hx   . Colon cancer Neg Hx     Social History Social History   Tobacco Use  . Smoking status: Never Smoker  . Smokeless tobacco: Never Used  Substance Use Topics  . Alcohol use: No    Alcohol/week: 0.0 oz  . Drug use: No     Allergies   Patient has no known allergies.   Review of Systems Review of Systems  Constitutional: Negative for fatigue and fever.  HENT: Negative for mouth sores.   Eyes: Negative for visual disturbance.  Respiratory: Negative for shortness of breath.   Cardiovascular: Negative for chest pain.  Gastrointestinal: Negative for abdominal pain, nausea and vomiting.  Genitourinary: Negative for genital sores.  Musculoskeletal: Negative for arthralgias and joint swelling.  Skin: Positive for color change and rash. Negative for wound.  Neurological: Negative for dizziness, weakness, light-headedness and headaches.     Physical Exam Triage Vital Signs ED Triage Vitals [01/12/18 1418]  Enc Vitals Group     BP 120/60     Pulse Rate 83     Resp 18     Temp 97.8 F (36.6 C)     Temp src      SpO2 99 %     Weight      Height      Head Circumference      Peak Flow        Pain Score      Pain Loc      Pain Edu?      Excl. in [place]?    No data found.  Updated Vital Signs BP 120/60   Pulse 83   Temp 97.8 F (36.6 C)   Resp 18   SpO2 99%   Visual Acuity Right Eye Distance:   Left Eye Distance:   Bilateral Distance:    Right  Eye Near:   Left Eye Near:    Bilateral Near:     Physical Exam  Constitutional: She is oriented to person, place, and time. She appears well-developed and well-nourished.  No acute distress  HENT:  Head: Normocephalic and atraumatic.  Nose: Nose normal.  Eyes: Conjunctivae are normal.  Neck: Neck supple.  Cardiovascular: Normal rate.  Pulmonary/Chest: Effort normal. No respiratory distress.  Abdominal: She exhibits no distension.  Musculoskeletal: Normal range of motion.  Neurological: She is alert and oriented to person, place, and time.  Skin: Skin is warm and dry. Rash noted. There is erythema.  Diffusely macular erythematous area to right posterior lumbar area, smaller patches of erythema to right abdomen, does not cross midline  Psychiatric: She has a normal mood and affect.  Nursing note and vitals reviewed.        UC Treatments / Results  Labs (all labs ordered are listed, but only abnormal results are displayed) Labs Reviewed - No data to display  EKG None  Radiology No results found.  Procedures Procedures (including critical care time)  Medications Ordered in UC Medications - No data to display  Initial Impression / Assessment and Plan / UC Course  I have reviewed the triage vital signs and the nursing notes.  Pertinent labs & imaging results that were available during my care of the patient were reviewed by me and considered in my medical decision making (see chart for details).     Rash concerning for possible allergic reaction versus shingles.  Area on back appears to cross dermatomes vertically, but rash does not cross midline in the front or the back.  Has associated pain and  discomfort overlying rash.  We will go ahead and empirically initiate treatment with Valtrex alone no typical physical lesions observed.  Will recommend antihistamines, hydrocortisone cream.  Will avoid oral prednisone given patient history of osteoporosis.Discussed strict return precautions. Patient verbalized understanding and is agreeable with plan.  Final Clinical Impressions(s) / UC Diagnoses   Final diagnoses:  Rash and nonspecific skin eruption     Discharge Instructions     Please begin valtrex for possible shingles; could possibly be an allergic reaction  Please take anti-histamines- daily allergy pill- zyrtec or Claritin, or benadryl  May apply hydrocortisone cream twice daily in thin amount to help with itching   Follow up if worsening or changing   ED Prescriptions    Medication Sig Dispense Auth. Provider   valACYclovir (VALTREX) 1000 MG tablet Take 1 tablet (1,000 mg total) by mouth 3 (three) times daily for 7 days. 21 tablet Quade Ramirez, [place] C, PA-C     Controlled Substance Prescriptions [place] Controlled Substance Registry consulted? Not Applicable   Janith Lima, [place] 01/12/18 [id number]

## 2018-01-12 NOTE — ED Triage Notes (Signed)
Pt c/o rash and itching on the R flank area.

## 2018-02-06 ENCOUNTER — Other Ambulatory Visit: Payer: Self-pay | Admitting: Internal Medicine

## 2018-02-06 DIAGNOSIS — K219 Gastro-esophageal reflux disease without esophagitis: Secondary | ICD-10-CM

## 2018-02-06 DIAGNOSIS — E785 Hyperlipidemia, unspecified: Secondary | ICD-10-CM

## 2018-02-09 ENCOUNTER — Encounter (HOSPITAL_COMMUNITY): Payer: Self-pay

## 2018-02-09 ENCOUNTER — Ambulatory Visit (HOSPITAL_COMMUNITY)
Admission: EM | Admit: 2018-02-09 | Discharge: 2018-02-09 | Disposition: A | Discharge status: Home / Self Care | Payer: Medicare HMO | Attending: Internal Medicine | Admitting: Internal Medicine

## 2018-02-09 ENCOUNTER — Other Ambulatory Visit: Payer: Self-pay

## 2018-02-09 DIAGNOSIS — J029 Acute pharyngitis, unspecified: Secondary | ICD-10-CM

## 2018-02-09 MED ORDER — AMOXICILLIN-POT CLAVULANATE 875-125 MG PO TABS: 1.0000 | ORAL_TABLET | Freq: Two times a day (BID) | ORAL | 0 refills | Status: DC

## 2018-02-09 MED ORDER — BENZONATATE 100 MG PO CAPS: 100.0000 mg | ORAL_CAPSULE | Freq: Three times a day (TID) | ORAL | 0 refills | Status: DC

## 2018-02-09 NOTE — ED Provider Notes (Signed)
[place]    CSN: [id number] Arrival date & time: 02/09/18  1159     History   Chief Complaint Chief Complaint  Patient presents with  . Sore Throat    HPI Charlene Patterson is a 72 y.o. female.   72 y.o. female presents with sore throat ear pressure for 2 days with intermittent left maxillary sinus pressure. Condition is acute in nature. Condition is made better by nothing. Condition is made worse by nothing patient denies any relief from Mucinex prior to there arrival at this facility.  Patient denies any fevers, or sick contacts.  Patient states that she is prone to sinus infections.      Past Medical History:  Diagnosis Date  . Allergy   . Anxiety   . Arthritis    "left foot" (05/26/2014)  . Chronic back pain   . Chronic neck pain   . Depression   . GERD (gastroesophageal reflux disease)   . History of chicken pox   . History of colon polyps   . History of hiatal hernia   . Lower GI bleeding   . Migraines    "monthly usually; weekly lately" (05/26/2014)  . Sciatica   . Thoracic compression fracture [facility])     Patient Active Problem List   Diagnosis Date Noted  . Iron deficiency anemia due to chronic blood loss 07/30/2017  . Need for hepatitis C screening test 07/30/2017  . Hyperglycemia 07/30/2017  . Episode of recurrent major depressive disorder ([place]) 01/05/2017  . Routine general medical examination at a health care facility 02/08/2016  . Hypertriglyceridemia 05/11/2015  . Memory loss 05/11/2015  . Hyperlipidemia with target LDL less than 130 10/29/2014  . Frequent PVCs 10/29/2014  . Vitamin D deficiency 09/24/2014  . Colitis 05/25/2014  . Allergic rhinitis 05/25/2014  . Spinal stenosis of lumbar region with radiculopathy 05/25/2014  . Cervical radiculitis 05/12/2014  . Thoracic compression fracture ([place]) 05/12/2014  . Visit for screening mammogram 05/10/2014  . GERD (gastroesophageal reflux disease) 12/13/2012  . Osteoporosis 12/13/2012    . Depression with somatization 12/13/2012    Past Surgical History:  Procedure Laterality Date  . ABDOMINAL HYSTERECTOMY  ~ 1978   "partial"  . APPENDECTOMY  ~ 1978  . COLONOSCOPY  2005   last 2005  . POLYPECTOMY      OB History   None      Home Medications    Prior to Admission medications   Medication Sig Start Date End Date Taking? Authorizing Provider  acetaminophen (TYLENOL) 650 MG CR tablet Take 650 mg by mouth every 8 (eight) hours as needed for pain.   Yes [provider]  atorvastatin (LIPITOR) 40 MG tablet TAKE 1 TABLET EVERY DAY 02/06/18  Yes Janith Lima, MD  Calcium-Magnesium-Vitamin D (CALCIUM 1200+D3 PO) Take 2 capsules by mouth daily.   Yes [provider]  citalopram (CELEXA) 10 MG tablet Take 1 tablet (10 mg total) by mouth daily. 08/10/17  Yes Janith Lima, MD  Ergocalciferol (DRISDOL PO) Take 3,000 Units by mouth daily.   Yes [provider]  Ferrous Sulfate (RA IRON) 27 MG TABS Take 1 tablet by mouth as needed (low iron level).    Yes [provider]  loratadine (CLARITIN) 10 MG tablet Take 10 mg by mouth at bedtime as needed (helps with sleep and headaches).   Yes [provider]  mirtazapine (REMERON) 7.5 MG tablet Take 1 tablet (7.5 mg total) by mouth at bedtime. 08/10/17  Yes Janith Lima, MD  Multiple Vitamin (MULTIVITAMIN) tablet Take 1 tablet by mouth daily.   Yes [provider]  pantoprazole (PROTONIX) 40 MG tablet TAKE 1 TABLET EVERY DAY 02/06/18  Yes Janith Lima, MD  triamcinolone cream (KENALOG) 0.5 % Apply 1 application topically 3 (three) times daily. 10/23/17  Yes Yu, Amy V, PA-C  vitamin B-12 (CYANOCOBALAMIN) 1000 MCG tablet Take 1,000 mcg by mouth daily.   Yes [provider]  vitamin E 400 UNIT capsule Take 400 Units by mouth daily.   Yes [provider]  hydrocortisone-pramoxine (PROCTOFOAM HC) rectal foam Place 1 applicator rectally 2 (two) times daily. 05/11/15    Janith Lima, MD  predniSONE (STERAPRED UNI-PAK 21 TAB) 10 MG (21) TBPK tablet Take by mouth daily. Take 6 tabs by mouth day 1, then 5 tabs, then 4 tabs, then 3 tabs, 2 tabs, then 1 tab for the last day Patient not taking: Reported on 01/12/2018 10/23/17   Ok Edwards, PA-C  traMADol (ULTRAM) 50 MG tablet Take 1 tablet (50 mg total) by mouth at bedtime as needed. 12/22/16   Kirsteins, Luanna Salk, MD    Family History Family History  Problem Relation Age of Onset  . Heart disease Mother   . Heart disease Father   . Prostate cancer Father   . Diabetes Sister   . Stomach cancer Sister 50  . Diabetes Brother   . Heart disease Brother   . Rectal cancer Neg Hx   . Colon cancer Neg Hx     Social History Social History   Tobacco Use  . Smoking status: Never Smoker  . Smokeless tobacco: Never Used  Substance Use Topics  . Alcohol use: No    Alcohol/week: 0.0 standard drinks  . Drug use: No     Allergies   Patient has no known allergies.   Review of Systems Review of Systems  Constitutional: Negative for chills and fever.  HENT: Positive for ear pain and sore throat.        Describes ear pain as pressure worse in the right.  Eyes: Negative for pain and visual disturbance.  Respiratory: Positive for cough. Negative for shortness of breath.   Cardiovascular: Negative for chest pain and palpitations.  Gastrointestinal: Negative for abdominal pain and vomiting.  Genitourinary: Negative for dysuria and hematuria.  Musculoskeletal: Negative for arthralgias and back pain.  Skin: Negative for color change and rash.  Neurological: Negative for seizures and syncope.  All other systems reviewed and are negative.    Physical Exam Triage Vital Signs ED Triage Vitals  Enc Vitals Group     BP 02/09/18 1252 125/74     Pulse Rate 02/09/18 1252 67     Resp 02/09/18 1252 16     Temp 02/09/18 1252 98.2 F (36.8 C)     Temp Source 02/09/18 1252 Oral     SpO2 02/09/18 1252 99 %      Weight --      Height --      Head Circumference --      Peak Flow --      Pain Score 02/09/18 1254 8     Pain Loc --      Pain Edu? --      Excl. in [place]? --    No data found.  Updated Vital Signs BP 125/74 (BP Location: Left Arm)   Pulse 67   Temp 98.2 F (36.8 C) (Oral)   Resp 16  SpO2 99%   Visual Acuity Right Eye Distance:   Left Eye Distance:   Bilateral Distance:    Right Eye Near:   Left Eye Near:    Bilateral Near:     Physical Exam  Constitutional: She is oriented to person, place, and time. She appears well-developed and well-nourished.  HENT:  Head: Normocephalic and atraumatic.  Right Ear: Tympanic membrane normal.  Left Ear: Tympanic membrane normal.  Clear drainage noted bilateral nares.  Erythema noted to throat.  Eyes: Conjunctivae are normal.  Neck: Normal range of motion.  Pulmonary/Chest: Effort normal.  Neurological: She is alert and oriented to person, place, and time.  Psychiatric: She has a normal mood and affect.  Nursing note and vitals reviewed.    UC Treatments / Results  Labs (all labs ordered are listed, but only abnormal results are displayed) Labs Reviewed - No data to display  EKG None  Radiology No results found.  Procedures Procedures (including critical care time)  Medications Ordered in UC Medications - No data to display  Initial Impression / Assessment and Plan / UC Course  I have reviewed the triage vital signs and the nursing notes.  Pertinent labs & imaging results that were available during my care of the patient were reviewed by me and considered in my medical decision making (see chart for details).      Final Clinical Impressions(s) / UC Diagnoses   Final diagnoses:  None   Discharge Instructions   None    ED Prescriptions    None     Controlled Substance Prescriptions [place] Controlled Substance Registry consulted? Not Applicable   Jacqualine Mau, NP 02/09/18 1331

## 2018-02-09 NOTE — ED Triage Notes (Signed)
Pt presents today with sore throat, cough, and right ear pain that started yesterday. States that she has been taking mucinex D with some relief.

## 2018-02-09 NOTE — Discharge Instructions (Signed)
Continue to push fluids and take over the counter medications as directed on the back of the box for symptomatic relief.  If symptoms do not improve in 3 to 5 days please start antibiotic.

## 2018-04-04 ENCOUNTER — Encounter (INDEPENDENT_AMBULATORY_CARE_PROVIDER_SITE_OTHER): Payer: Medicare HMO | Admitting: Ophthalmology

## 2018-04-04 DIAGNOSIS — H43813 Vitreous degeneration, bilateral: Secondary | ICD-10-CM | POA: Diagnosis not present

## 2018-04-04 DIAGNOSIS — H33301 Unspecified retinal break, right eye: Secondary | ICD-10-CM

## 2018-04-04 DIAGNOSIS — H2513 Age-related nuclear cataract, bilateral: Secondary | ICD-10-CM

## 2018-04-19 ENCOUNTER — Other Ambulatory Visit: Payer: Self-pay | Admitting: Internal Medicine

## 2018-04-19 DIAGNOSIS — F329 Major depressive disorder, single episode, unspecified: Secondary | ICD-10-CM

## 2018-04-19 DIAGNOSIS — F32A Depression, unspecified: Secondary | ICD-10-CM

## 2018-04-19 DIAGNOSIS — F450 Somatization disorder: Principal | ICD-10-CM

## 2018-06-04 ENCOUNTER — Ambulatory Visit (INDEPENDENT_AMBULATORY_CARE_PROVIDER_SITE_OTHER): Payer: Medicare HMO | Admitting: Internal Medicine

## 2018-06-04 ENCOUNTER — Encounter: Payer: Self-pay | Admitting: Internal Medicine

## 2018-06-04 VITALS — BP 112/74 | HR 100 | Temp 98.6°F | Ht 64.0 in | Wt 137.0 lb

## 2018-06-04 DIAGNOSIS — R739 Hyperglycemia, unspecified: Secondary | ICD-10-CM | POA: Diagnosis not present

## 2018-06-04 DIAGNOSIS — J019 Acute sinusitis, unspecified: Secondary | ICD-10-CM | POA: Insufficient documentation

## 2018-06-04 DIAGNOSIS — R220 Localized swelling, mass and lump, head: Secondary | ICD-10-CM | POA: Diagnosis not present

## 2018-06-04 DIAGNOSIS — J309 Allergic rhinitis, unspecified: Secondary | ICD-10-CM | POA: Diagnosis not present

## 2018-06-04 DIAGNOSIS — J0101 Acute recurrent maxillary sinusitis: Secondary | ICD-10-CM | POA: Diagnosis not present

## 2018-06-04 MED ORDER — AMOXICILLIN-POT CLAVULANATE 875-125 MG PO TABS: 1.0000 | ORAL_TABLET | Freq: Two times a day (BID) | ORAL | 0 refills | Status: DC

## 2018-06-04 NOTE — Assessment & Plan Note (Signed)
Mild to mod, for antibx course, also Ct sinus to r/o obstruction, refer ENT, to f/u any worsening symptoms or concerns

## 2018-06-04 NOTE — Patient Instructions (Signed)
Please take all new medication as prescribed - the antibiotic  You will be contacted regarding the referral for: CT scan for sinus, and ENT referral  Please continue all other medications as before, and refills have been done if requested.  Please have the pharmacy call with any other refills you may need.  Please keep your appointments with your specialists as you may have planned

## 2018-06-04 NOTE — Assessment & Plan Note (Signed)
Stable, to cont same tx

## 2018-06-04 NOTE — Assessment & Plan Note (Signed)
stable overall by history and exam, recent data reviewed with pt, and pt to continue medical treatment as before,  to f/u any worsening symptoms or concerns  

## 2018-06-04 NOTE — Progress Notes (Signed)
Subjective:    Patient ID: Charlene Patterson, female    DOB: [birth year], 72 y.o.   MRN: [id number]  HPI   Here with 2 wks acute onset fever, left facial pain, pressure and swelling, headache, general weakness and malaise, with mild ST and cough, but pt denies chest pain, wheezing, increased sob or doe, orthopnea, PND, increased LE swelling, palpitations, dizziness or syncope.  Just cant seem to drain the left maxillary sinus area, has always been able to eventually improve but just does not feel improving, cant seem to drain.  Pt denies chest pain, increased sob or doe, wheezing, orthopnea, PND, increased LE swelling, palpitations, dizziness or syncope.  Pt denies new neurological symptoms such as new headache, or facial or extremity weakness or numbness   Pt denies polydipsia, polyuria Past Medical History:  Diagnosis Date  . Allergy   . Anxiety   . Arthritis    "left foot" (05/26/2014)  . Chronic back pain   . Chronic neck pain   . Depression   . GERD (gastroesophageal reflux disease)   . History of chicken pox   . History of colon polyps   . History of hiatal hernia   . Lower GI bleeding   . Migraines    "monthly usually; weekly lately" (05/26/2014)  . Sciatica   . Thoracic compression fracture [facility])    Past Surgical History:  Procedure Laterality Date  . ABDOMINAL HYSTERECTOMY  ~ 1978   "partial"  . APPENDECTOMY  ~ 1978  . COLONOSCOPY  2005   last 2005  . POLYPECTOMY      reports that she has never smoked. She has never used smokeless tobacco. She reports that she does not drink alcohol or use drugs. family history includes Diabetes in her brother and sister; Heart disease in her brother, father, and mother; Prostate cancer in her father; Stomach cancer (age of onset: 30) in her sister. No Known Allergies Current Outpatient Medications on File Prior to Visit  Medication Sig Dispense Refill  . acetaminophen (TYLENOL) 650 MG CR tablet Take 650 mg by mouth every 8 (eight) hours  as needed for pain.    Marland Kitchen atorvastatin (LIPITOR) 40 MG tablet TAKE 1 TABLET EVERY DAY 90 tablet 1  . benzonatate (TESSALON) 100 MG capsule Take 1 capsule (100 mg total) by mouth every 8 (eight) hours. 21 capsule 0  . Calcium-Magnesium-Vitamin D (CALCIUM 1200+D3 PO) Take 2 capsules by mouth daily.    . citalopram (CELEXA) 10 MG tablet TAKE 1 TABLET EVERY DAY 90 tablet 1  . Ergocalciferol (DRISDOL PO) Take 3,000 Units by mouth daily.    . Ferrous Sulfate (RA IRON) 27 MG TABS Take 1 tablet by mouth as needed (low iron level).     . hydrocortisone-pramoxine (PROCTOFOAM HC) rectal foam Place 1 applicator rectally 2 (two) times daily. 10 g 2  . loratadine (CLARITIN) 10 MG tablet Take 10 mg by mouth at bedtime as needed (helps with sleep and headaches).    . mirtazapine (REMERON) 7.5 MG tablet TAKE 1 TABLET (7.5 MG TOTAL) BY MOUTH AT BEDTIME. 90 tablet 1  . Multiple Vitamin (MULTIVITAMIN) tablet Take 1 tablet by mouth daily.    . pantoprazole (PROTONIX) 40 MG tablet TAKE 1 TABLET EVERY DAY 90 tablet 1  . predniSONE (STERAPRED UNI-PAK 21 TAB) 10 MG (21) TBPK tablet Take by mouth daily. Take 6 tabs by mouth day 1, then 5 tabs, then 4 tabs, then 3 tabs, 2 tabs, then 1 tab for the  last day (Patient not taking: Reported on 01/12/2018) 21 tablet 0  . traMADol (ULTRAM) 50 MG tablet Take 1 tablet (50 mg total) by mouth at bedtime as needed. 30 tablet 1  . triamcinolone cream (KENALOG) 0.5 % Apply 1 application topically 3 (three) times daily. 30 g 0  . vitamin B-12 (CYANOCOBALAMIN) 1000 MCG tablet Take 1,000 mcg by mouth daily.    . vitamin E 400 UNIT capsule Take 400 Units by mouth daily.     No current facility-administered medications on file prior to visit.    Review of Systems  Constitutional: Negative for other unusual diaphoresis or sweats HENT: Negative for ear discharge or swelling Eyes: Negative for other worsening visual disturbances Respiratory: Negative for stridor or other swelling    Gastrointestinal: Negative for worsening distension or other blood Genitourinary: Negative for retention or other urinary change Musculoskeletal: Negative for other MSK pain or swelling Skin: Negative for color change or other new lesions Neurological: Negative for worsening tremors and other numbness  Psychiatric/Behavioral: Negative for worsening agitation or other fatigue All other system neg per pt    Objective:   Physical Exam BP 112/74   Pulse 100   Temp 98.6 F (37 C) (Oral)   Ht 5\' 4"  (1.626 m)   Wt 137 lb (62.1 kg)   SpO2 96%   BMI 23.52 kg/m  VS noted, mild ill Constitutional: Pt appears in NAD HENT: Head: NCAT.  Right Ear: External ear normal.  Left Ear: External ear normal.  Eyes: . Pupils are equal, round, and reactive to light. Conjunctivae and EOM are normal Bilat tm's with mild erythema.  Max sinus areas mod left maxillary tender.  Pharynx with mild erythema, no exudate Nose: without d/c or deformity Neck: Neck supple. Gross normal ROM Cardiovascular: Normal rate and regular rhythm.   Pulmonary/Chest: Effort normal and breath sounds without rales or wheezing.  Abd:  Soft, NT, ND, + BS, no organomegaly Neurological: Pt is alert. At baseline orientation, motor grossly intact Skin: Skin is warm. No rashes, other new lesions, no LE edema Psychiatric: Pt behavior is normal without agitation  No other exam findings Lab Results  Component Value Date   WBC 4.5 07/30/2017   HGB 13.5 07/30/2017   HCT 39.7 07/30/2017   PLT 272.0 07/30/2017   GLUCOSE 88 07/30/2017   CHOL 186 07/30/2017   TRIG 301.0 (H) 07/30/2017   HDL 42.10 07/30/2017   LDLDIRECT 76.0 07/30/2017   ALT 15 07/30/2017   AST 17 07/30/2017   NA 141 07/30/2017   K 4.3 07/30/2017   CL 105 07/30/2017   CREATININE 1.06 07/30/2017   BUN 17 07/30/2017   CO2 28 07/30/2017   TSH 4.23 07/30/2017   HGBA1C 5.6 07/30/2017          Assessment & Plan:

## 2018-06-13 ENCOUNTER — Encounter: Payer: Self-pay | Admitting: Internal Medicine

## 2018-06-13 ENCOUNTER — Ambulatory Visit (INDEPENDENT_AMBULATORY_CARE_PROVIDER_SITE_OTHER)
Admission: RE | Admit: 2018-06-13 | Discharge: 2018-06-13 | Disposition: A | Discharge status: Home / Self Care | Payer: Medicare HMO | Source: Ambulatory Visit | Attending: Internal Medicine | Admitting: Internal Medicine

## 2018-06-13 DIAGNOSIS — R220 Localized swelling, mass and lump, head: Secondary | ICD-10-CM

## 2018-07-05 ENCOUNTER — Encounter: Payer: Self-pay | Admitting: Internal Medicine

## 2018-07-08 ENCOUNTER — Other Ambulatory Visit: Payer: Self-pay | Admitting: Internal Medicine

## 2018-07-08 DIAGNOSIS — K219 Gastro-esophageal reflux disease without esophagitis: Secondary | ICD-10-CM

## 2018-07-08 DIAGNOSIS — E785 Hyperlipidemia, unspecified: Secondary | ICD-10-CM

## 2018-08-08 DIAGNOSIS — J329 Chronic sinusitis, unspecified: Secondary | ICD-10-CM | POA: Diagnosis not present

## 2018-08-08 DIAGNOSIS — R519 Headache, unspecified: Secondary | ICD-10-CM | POA: Insufficient documentation

## 2018-08-08 DIAGNOSIS — R51 Headache: Secondary | ICD-10-CM | POA: Diagnosis not present

## 2018-08-08 HISTORY — DX: Headache, unspecified: R51.9

## 2018-09-02 ENCOUNTER — Ambulatory Visit (INDEPENDENT_AMBULATORY_CARE_PROVIDER_SITE_OTHER): Payer: Medicare HMO | Admitting: *Deleted

## 2018-09-02 VITALS — BP 134/82 | HR 80 | Resp 17 | Ht 64.0 in | Wt 138.0 lb

## 2018-09-02 DIAGNOSIS — Z0000 Encounter for general adult medical examination without abnormal findings: Secondary | ICD-10-CM

## 2018-09-02 DIAGNOSIS — Z23 Encounter for immunization: Secondary | ICD-10-CM

## 2018-09-02 NOTE — Patient Instructions (Addendum)
Continue doing brain stimulating activities (puzzles, reading, adult coloring books, staying active) to keep memory sharp.   Continue to eat heart healthy diet (full of fruits, vegetables, whole grains, lean protein, water--limit salt, fat, and sugar intake) and increase physical activity as tolerated.   [name] , Thank you for taking time to come for your Medicare Wellness Visit. I appreciate your ongoing commitment to your health goals. Please review the following plan we discussed and let me know if I can assist you in the future.   These are the goals we discussed: Goals    . Patient Stated     Increase my physical activity by walking more often, Continue worship God, love family and enjoy life.    . Patient Stated     Continue ride my bicycle and enjoy yard work.       This is a list of the screening recommended for you and due dates:  Health Maintenance  Topic Date Due  . Flu Shot  10/02/2018*  . Mammogram  09/27/2019  . Tetanus Vaccine  12/14/2022  . Colon Cancer Screening  08/13/2024  . DEXA scan (bone density measurement)  Completed  .  Hepatitis C: One time screening is recommended by Center for Disease Control  (CDC) for  adults born from [birth year] through 1964.   Completed  . Pneumonia vaccines  Completed  *Topic was postponed. The date shown is not the original due date.     High-Fiber Diet Fiber, also called dietary fiber, is a type of carbohydrate that is found in fruits, vegetables, whole grains, and beans. A high-fiber diet can have many health benefits. Your health care provider may recommend a high-fiber diet to help:  Prevent constipation. Fiber can make your bowel movements more regular.  Lower your cholesterol.  Relieve the following conditions: ? Swelling of veins in the anus (hemorrhoids). ? Swelling and irritation (inflammation) of specific areas of the digestive tract (uncomplicated diverticulosis). ? A problem of the large intestine (colon) that  sometimes causes pain and diarrhea (irritable bowel syndrome, IBS).  Prevent overeating as part of a weight-loss plan.  Prevent heart disease, type 2 diabetes, and certain cancers. What is my plan? The recommended daily fiber intake in grams (g) includes:  38 g for men age 56 or younger.  30 g for men over age 80.  31 g for women age 51 or younger.  21 g for women over age 55. You can get the recommended daily intake of dietary fiber by:  Eating a variety of fruits, vegetables, grains, and beans.  Taking a fiber supplement, if it is not possible to get enough fiber through your diet. What do I need to know about a high-fiber diet?  It is better to get fiber through food sources rather than from fiber supplements. There is not a lot of research about how effective supplements are.  Always check the fiber content on the nutrition facts label of any prepackaged food. Look for foods that contain 5 g of fiber or more per serving.  Talk with a diet and nutrition specialist (dietitian) if you have questions about specific foods that are recommended or not recommended for your medical condition, especially if those foods are not listed below.  Gradually increase how much fiber you consume. If you increase your intake of dietary fiber too quickly, you may have bloating, cramping, or gas.  Drink plenty of water. Water helps you to digest fiber. What are tips for following this plan?  Eat a wide variety of high-fiber foods.  Make sure that half of the grains that you eat each day are whole grains.  Eat breads and cereals that are made with whole-grain flour instead of refined flour or white flour.  Eat brown rice, bulgur wheat, or millet instead of white rice.  Start the day with a breakfast that is high in fiber, such as a cereal that contains 5 g of fiber or more per serving.  Use beans in place of meat in soups, salads, and pasta dishes.  Eat high-fiber snacks, such as berries,  raw vegetables, nuts, and popcorn.  Choose whole fruits and vegetables instead of processed forms like juice or sauce. What foods can I eat?  Fruits Berries. Pears. Apples. Oranges. Avocado. Prunes and raisins. Dried figs. Vegetables Sweet potatoes. Spinach. Kale. Artichokes. Cabbage. Broccoli. Cauliflower. Green peas. Carrots. Squash. Grains Whole-grain breads. Multigrain cereal. Oats and oatmeal. Brown rice. Barley. Bulgur wheat. [place]. Quinoa. Bran muffins. Popcorn. Rye wafer crackers. Meats and other proteins Navy, kidney, and pinto beans. Soybeans. Split peas. Lentils. Nuts and seeds. Dairy Fiber-fortified yogurt. Beverages Fiber-fortified soy milk. Fiber-fortified orange juice. Other foods Fiber bars. The items listed above may not be a complete list of recommended foods and beverages. Contact a dietitian for more options. What foods are not recommended? Fruits Fruit juice. Cooked, strained fruit. Vegetables Fried potatoes. Canned vegetables. Well-cooked vegetables. Grains White bread. Pasta made with refined flour. White rice. Meats and other proteins Fatty cuts of meat. Fried chicken or fried fish. Dairy Milk. Yogurt. Cream cheese. Sour cream. Fats and oils Butters. Beverages Soft drinks. Other foods Cakes and pastries. The items listed above may not be a complete list of foods and beverages to avoid. Contact a dietitian for more information. Summary  Fiber is a type of carbohydrate. It is found in fruits, vegetables, whole grains, and beans.  There are many health benefits of eating a high-fiber diet, such as preventing constipation, lowering blood cholesterol, helping with weight loss, and reducing your risk of heart disease, diabetes, and certain cancers.  Gradually increase your intake of fiber. Increasing too fast can result in cramping, bloating, and gas. Drink plenty of water while you increase your fiber.  The best sources of fiber include whole fruits  and vegetables, whole grains, nuts, seeds, and beans. This information is not intended to replace advice given to you by your health care provider. Make sure you discuss any questions you have with your health care provider. Document Released: 06/19/2005 Document Revised: 04/23/2017 Document Reviewed: 04/23/2017 Elsevier Interactive Patient Education  2019 [employer].

## 2018-09-02 NOTE — Progress Notes (Addendum)
Subjective:   Charlene Patterson is a 73 y.o. female who presents for Medicare Annual (Subsequent) preventive examination.  Review of Systems:  No ROS.  Medicare Wellness Visit. Additional risk factors are reflected in the social history. Cardiac Risk Factors include: advanced age (>47men, >28 women) Sleep patterns: feels rested on waking, gets up 1 times nightly to void and sleeps 6-7 hours nightly.    Home Safety/Smoke Alarms: Feels safe in home. Smoke alarms in place.  Living environment; residence and Firearm Safety: [place], can live on one level. Lives with family, no needs for DME, good support system Seat Belt Safety/Bike Helmet: Wears seat belt.     Objective:     Vitals: BP 134/82   Pulse 80   Resp 17   Ht 5\' 4"  (1.626 m)   Wt 138 lb (62.6 kg)   SpO2 99%   BMI 23.69 kg/m   Body mass index is 23.69 kg/m.  Advanced Directives 09/02/2018 08/27/2017 12/22/2016 11/29/2016 11/16/2016 02/08/2016 08/10/2014  Does Patient Have a Medical Advance Directive? Yes No Yes No No Yes Yes  Type of Paramedic of [place];Living will - [place];Living will - - [place];Living will [place];Living will  Does patient want to make changes to medical advance directive? - - - - - No - Patient declined -  Copy of [place] in Chart? No - copy requested - No - copy requested - - Yes -  Would patient like information on creating a medical advance directive? - Yes (ED - Information included in AVS) - - - - -    Tobacco Social History   Tobacco Use  Smoking Status Never Smoker  Smokeless Tobacco Never Used     Counseling given: Not Answered  Past Medical History:  Diagnosis Date  . Allergy   . Anxiety   . Arthritis    "left foot" (05/26/2014)  . Chronic back pain   . Chronic neck pain   . Depression   . GERD (gastroesophageal reflux disease)   . History of chicken pox   . History of  colon polyps   . History of hiatal hernia   . Lower GI bleeding   . Migraines    "monthly usually; weekly lately" (05/26/2014)  . Sciatica   . Thoracic compression fracture [facility])    Past Surgical History:  Procedure Laterality Date  . ABDOMINAL HYSTERECTOMY  ~ 1978   "partial"  . APPENDECTOMY  ~ 1978  . COLONOSCOPY  2005   last 2005  . POLYPECTOMY     Family History  Problem Relation Age of Onset  . Heart disease Mother   . Heart disease Father   . Prostate cancer Father   . Diabetes Sister   . Stomach cancer Sister 69  . Diabetes Brother   . Heart disease Brother   . Rectal cancer Neg Hx   . Colon cancer Neg Hx    Social History   Socioeconomic History  . Marital status: Widowed    Spouse name: Not on file  . Number of children: 2  . Years of education: 24  . Highest education level: Not on file  Occupational History  . Occupation: Retired  Scientific laboratory technician  . Financial resource strain: Not very hard  . Food insecurity:    Worry: Never true    Inability: Never true  . Transportation needs:    Medical: No    Non-medical: No  Tobacco Use  . Smoking status: Never Smoker  . Smokeless tobacco: Never Used  Substance and Sexual Activity  . Alcohol use: No    Alcohol/week: 0.0 standard drinks  . Drug use: No  . Sexual activity: Never  Lifestyle  . Physical activity:    Days per week: 5 days    Minutes per session: 50 min  . Stress: To some extent  Relationships  . Social connections:    Talks on phone: More than three times a week    Gets together: More than three times a week    Attends religious service: More than 4 times per year    Active member of club or organization: Yes    Attends meetings of clubs or organizations: More than 4 times per year    Relationship status: Widowed  Other Topics Concern  . Not on file  Social History Narrative   Regular exercise-no   Caffeine Use-yes    Outpatient Encounter Medications as of 09/02/2018  Medication Sig    . acetaminophen (TYLENOL) 650 MG CR tablet Take 650 mg by mouth every 8 (eight) hours as needed for pain.  Marland Kitchen atorvastatin (LIPITOR) 40 MG tablet TAKE 1 TABLET EVERY DAY  . Calcium-Magnesium-Vitamin D (CALCIUM 1200+D3 PO) Take 2 capsules by mouth daily.  . citalopram (CELEXA) 10 MG tablet TAKE 1 TABLET EVERY DAY  . Ergocalciferol (DRISDOL PO) Take 3,000 Units by mouth daily.  . Ferrous Sulfate (RA IRON) 27 MG TABS Take 1 tablet by mouth as needed (low iron level).   . hydrocortisone-pramoxine (PROCTOFOAM HC) rectal foam Place 1 applicator rectally 2 (two) times daily.  Marland Kitchen loratadine (CLARITIN) 10 MG tablet Take 10 mg by mouth at bedtime as needed (helps with sleep and headaches).  . mirtazapine (REMERON) 7.5 MG tablet TAKE 1 TABLET (7.5 MG TOTAL) BY MOUTH AT BEDTIME.  . Multiple Vitamin (MULTIVITAMIN) tablet Take 1 tablet by mouth daily.  . pantoprazole (PROTONIX) 40 MG tablet TAKE 1 TABLET EVERY DAY  . triamcinolone cream (KENALOG) 0.5 % Apply 1 application topically 3 (three) times daily.  . vitamin B-12 (CYANOCOBALAMIN) 1000 MCG tablet Take 1,000 mcg by mouth daily.  . vitamin E 400 UNIT capsule Take 400 Units by mouth daily.  . [DISCONTINUED] amoxicillin-clavulanate (AUGMENTIN) 875-125 MG tablet Take 1 tablet by mouth 2 (two) times daily. (Patient not taking: Reported on 09/02/2018)  . [DISCONTINUED] benzonatate (TESSALON) 100 MG capsule Take 1 capsule (100 mg total) by mouth every 8 (eight) hours. (Patient not taking: Reported on 09/02/2018)  . [DISCONTINUED] predniSONE (STERAPRED UNI-PAK 21 TAB) 10 MG (21) TBPK tablet Take by mouth daily. Take 6 tabs by mouth day 1, then 5 tabs, then 4 tabs, then 3 tabs, 2 tabs, then 1 tab for the last day (Patient not taking: Reported on 01/12/2018)  . [DISCONTINUED] traMADol (ULTRAM) 50 MG tablet Take 1 tablet (50 mg total) by mouth at bedtime as needed. (Patient not taking: Reported on 09/02/2018)   No facility-administered encounter medications on file as of  09/02/2018.     Activities of Daily Living In your present state of health, do you have any difficulty performing the following activities: 09/02/2018  Hearing? N  Vision? N  Difficulty concentrating or making decisions? N  Walking or climbing stairs? N  Dressing or bathing? N  Doing errands, shopping? N  Preparing Food and eating ? N  Using the Toilet? N  In the past six months, have you accidently leaked urine? N  Do you have  problems with loss of bowel control? N  Managing your Medications? N  Managing your Finances? N  Housekeeping or managing your Housekeeping? N  Some recent data might be hidden    Patient Care Team: Janith Lima, MD as PCP - General (Internal Medicine)    Assessment:   This is a routine wellness examination for [place]. Physical assessment deferred to PCP.  Exercise Activities and Dietary recommendations Current Exercise Habits: Home exercise routine, Type of exercise: walking, Time (Minutes): 50, Frequency (Times/Week): 5, Weekly Exercise (Minutes/Week): 250, Intensity: Mild, Exercise limited by: orthopedic condition(s)  Diet (meal preparation, eat out, water intake, caffeinated beverages, dairy products, fruits and vegetables): in general, a "healthy" diet  , well balanced   Reviewed heart healthy and diabetic diet. Encouraged patient to increase daily water and healthy fluid intake.  Goals    . Patient Stated     Increase my physical activity by walking more often, Continue worship God, love family and enjoy life.    . Patient Stated     Continue ride my bicycle and enjoy yard work.       Fall Risk Fall Risk  09/02/2018 08/27/2017 07/31/2017 01/05/2017 11/16/2016  Falls in the past year? 0 No No No No   Depression Screen PHQ 2/9 Scores 09/02/2018 08/27/2017 07/31/2017 11/16/2016  PHQ - 2 Score 1 3 0 2  PHQ- 9 Score 3 7 - 7     Cognitive Function MMSE - Mini Mental State Exam 09/02/2018 08/27/2017 04/17/2016 06/18/2015  Orientation to time 5 5 5 5     Orientation to Place 5 5 5 5   Registration 3 3 3 3   Attention/ Calculation 5 5 5 5   Recall 3 2 3 3   Language- name 2 objects 2 2 2 2   Language- repeat 1 1 1 1   Language- follow 3 step command 3 3 3 3   Language- read & follow direction 1 1 1 1   Write a sentence 1 1 1 1   Copy design 1 1 1 1   Total score 30 29 30 30         Immunization History  Administered Date(s) Administered  . Influenza, High Dose Seasonal PF 07/30/2017, 09/02/2018  . Influenza,inj,Quad PF,6+ Mos 04/14/2014, 05/11/2015  . Influenza-Unspecified 04/02/2012  . Pneumococcal Conjugate-13 05/11/2015  . Pneumococcal Polysaccharide-23 12/13/2012, 07/30/2017  . Tdap 12/13/2012   Screening Tests Health Maintenance  Topic Date Due  . MAMMOGRAM  09/27/2019  . TETANUS/TDAP  12/14/2022  . COLONOSCOPY  08/13/2024  . INFLUENZA VACCINE  Completed  . DEXA SCAN  Completed  . Hepatitis C Screening  Completed  . PNA vac Low Risk Adult  Completed      Plan:     Reviewed health maintenance screenings with patient today and relevant education, vaccines, and/or referrals were provided.   Continue doing brain stimulating activities (puzzles, reading, adult coloring books, staying active) to keep memory sharp.   Continue to eat heart healthy diet (full of fruits, vegetables, whole grains, lean protein, water--limit salt, fat, and sugar intake) and increase physical activity as tolerated.  I have personally reviewed and noted the following in the patient's chart:   . Medical and social history . Use of alcohol, tobacco or illicit drugs  . Current medications and supplements . Functional ability and status . Nutritional status . Physical activity . Advanced directives . List of other physicians . Vitals . Screenings to include cognitive, depression, and falls . Referrals and appointments  In addition, I have reviewed and discussed  with patient certain preventive protocols, quality metrics, and best practice  recommendations. A written personalized care plan for preventive services as well as general preventive health recommendations were provided to patient.     Michiel Cowboy, RN  09/02/2018  Medical screening examination/treatment/procedure(s) were performed by non-physician practitioner and as supervising physician I was immediately available for consultation/collaboration. I agree with above. Scarlette Calico, MD

## 2018-11-15 ENCOUNTER — Other Ambulatory Visit: Payer: Self-pay | Admitting: Internal Medicine

## 2018-11-15 DIAGNOSIS — F32A Depression, unspecified: Secondary | ICD-10-CM

## 2018-11-15 DIAGNOSIS — F329 Major depressive disorder, single episode, unspecified: Secondary | ICD-10-CM

## 2019-01-07 ENCOUNTER — Other Ambulatory Visit: Payer: Self-pay | Admitting: Internal Medicine

## 2019-01-07 DIAGNOSIS — E785 Hyperlipidemia, unspecified: Secondary | ICD-10-CM

## 2019-02-05 ENCOUNTER — Ambulatory Visit (INDEPENDENT_AMBULATORY_CARE_PROVIDER_SITE_OTHER): Payer: Medicare HMO | Admitting: Internal Medicine

## 2019-02-05 ENCOUNTER — Encounter: Payer: Self-pay | Admitting: Internal Medicine

## 2019-02-05 ENCOUNTER — Other Ambulatory Visit (INDEPENDENT_AMBULATORY_CARE_PROVIDER_SITE_OTHER): Payer: Medicare HMO

## 2019-02-05 ENCOUNTER — Other Ambulatory Visit: Payer: Self-pay

## 2019-02-05 VITALS — BP 132/72 | HR 70 | Temp 98.1°F | Ht 64.0 in | Wt 137.5 lb

## 2019-02-05 DIAGNOSIS — D513 Other dietary vitamin B12 deficiency anemia: Secondary | ICD-10-CM

## 2019-02-05 DIAGNOSIS — R739 Hyperglycemia, unspecified: Secondary | ICD-10-CM | POA: Diagnosis not present

## 2019-02-05 DIAGNOSIS — N183 Chronic kidney disease, stage 3 unspecified: Secondary | ICD-10-CM

## 2019-02-05 DIAGNOSIS — E785 Hyperlipidemia, unspecified: Secondary | ICD-10-CM | POA: Diagnosis not present

## 2019-02-05 DIAGNOSIS — E781 Pure hyperglyceridemia: Secondary | ICD-10-CM

## 2019-02-05 DIAGNOSIS — D500 Iron deficiency anemia secondary to blood loss (chronic): Secondary | ICD-10-CM

## 2019-02-05 DIAGNOSIS — K219 Gastro-esophageal reflux disease without esophagitis: Secondary | ICD-10-CM

## 2019-02-05 DIAGNOSIS — E559 Vitamin D deficiency, unspecified: Secondary | ICD-10-CM

## 2019-02-05 HISTORY — DX: Chronic kidney disease, stage 3 unspecified: N18.30

## 2019-02-05 HISTORY — DX: Other dietary vitamin B12 deficiency anemia: D51.3

## 2019-02-05 LAB — HEPATIC FUNCTION PANEL
ALT: 9 U/L (ref 0–35)
AST: 13 U/L (ref 0–37)
Albumin: 4.3 g/dL (ref 3.5–5.2)
Alkaline Phosphatase: 131 U/L — ABNORMAL HIGH (ref 39–117)
Bilirubin, Direct: 0 mg/dL (ref 0.0–0.3)
Total Bilirubin: 0.3 mg/dL (ref 0.2–1.2)
Total Protein: 7 g/dL (ref 6.0–8.3)

## 2019-02-05 LAB — BASIC METABOLIC PANEL
BUN: 23 mg/dL (ref 6–23)
CO2: 26 mEq/L (ref 19–32)
Calcium: 9.8 mg/dL (ref 8.4–10.5)
Chloride: 107 mEq/L (ref 96–112)
Creatinine, Ser: 1.1 mg/dL (ref 0.40–1.20)
GFR: 48.77 mL/min — ABNORMAL LOW (ref >60.00)
Glucose, Bld: 89 mg/dL (ref 70–99)
Potassium: 4.1 mEq/L (ref 3.5–5.1)
Sodium: 140 mEq/L (ref 135–145)

## 2019-02-05 LAB — IBC PANEL
Iron: 11 ug/dL — ABNORMAL LOW (ref 42–145)
Saturation Ratios: 2.5 % — ABNORMAL LOW (ref 20.0–50.0)
Transferrin: 320 mg/dL (ref 212.0–360.0)

## 2019-02-05 LAB — VITAMIN D 25 HYDROXY (VIT D DEFICIENCY, FRACTURES): VITD: 37.98 ng/mL (ref 30.00–100.00)

## 2019-02-05 LAB — LDL CHOLESTEROL, DIRECT: Direct LDL: 62 mg/dL

## 2019-02-05 LAB — CBC WITH DIFFERENTIAL/PLATELET
Basophils Absolute: 0 10*3/uL (ref 0.0–0.1)
Basophils Relative: 0.5 % (ref 0.0–3.0)
Eosinophils Absolute: 0.1 10*3/uL (ref 0.0–0.7)
Eosinophils Relative: 1.3 % (ref 0.0–5.0)
HCT: 29.9 % — ABNORMAL LOW (ref 36.0–46.0)
Hemoglobin: 9.6 g/dL — ABNORMAL LOW (ref 12.0–15.0)
Lymphocytes Relative: 38 % (ref 12.0–46.0)
Lymphs Abs: 1.5 10*3/uL (ref 0.7–4.0)
MCHC: 32.1 g/dL (ref 30.0–36.0)
MCV: 74.4 fl — ABNORMAL LOW (ref 78.0–100.0)
Monocytes Absolute: 0.3 10*3/uL (ref 0.1–1.0)
Monocytes Relative: 8.7 % (ref 3.0–12.0)
Neutro Abs: 2 10*3/uL (ref 1.4–7.7)
Neutrophils Relative %: 51.5 % (ref 43.0–77.0)
Platelets: 323 10*3/uL (ref 150.0–400.0)
RBC: 4.02 Mil/uL (ref 3.87–5.11)
RDW: 18.3 % — ABNORMAL HIGH (ref 11.5–15.5)
WBC: 3.9 10*3/uL — ABNORMAL LOW (ref 4.0–10.5)

## 2019-02-05 LAB — LIPID PANEL
Cholesterol: 184 mg/dL (ref 0–200)
HDL: 35.1 mg/dL — ABNORMAL LOW
NonHDL: 149.38
Total CHOL/HDL Ratio: 5
Triglycerides: 330 mg/dL — ABNORMAL HIGH (ref 0.0–149.0)
VLDL: 66 mg/dL — ABNORMAL HIGH (ref 0.0–40.0)

## 2019-02-05 LAB — FERRITIN: Ferritin: 5.4 ng/mL — ABNORMAL LOW (ref 10.0–291.0)

## 2019-02-05 LAB — TSH: TSH: 2.61 u[IU]/mL (ref 0.35–4.50)

## 2019-02-05 MED ORDER — ATORVASTATIN CALCIUM 40 MG PO TABS: 40.0000 mg | ORAL_TABLET | Freq: Every day | ORAL | 1 refills | Status: DC

## 2019-02-05 MED ORDER — PANTOPRAZOLE SODIUM 40 MG PO TBEC: 40.0000 mg | DELAYED_RELEASE_TABLET | Freq: Every day | ORAL | 1 refills | Status: DC

## 2019-02-05 MED ORDER — CYANOCOBALAMIN 2000 MCG PO TABS: 2000.0000 ug | ORAL_TABLET | Freq: Every day | ORAL | 1 refills | Status: DC

## 2019-02-05 MED ORDER — VASCEPA 1 G PO CAPS: 2.0000 | ORAL_CAPSULE | Freq: Two times a day (BID) | ORAL | 1 refills | Status: DC

## 2019-02-05 NOTE — Patient Instructions (Signed)
High Triglycerides Eating Plan Triglycerides are a type of fat in the blood. High levels of triglycerides can increase your risk of heart disease and stroke. If your triglyceride levels are high, choosing the right foods can help lower your triglycerides and keep your heart healthy. Work with your health care provider or a diet and nutrition specialist (dietitian) to develop an eating plan that is right for you. What are tips for following this plan? General guidelines   Lose weight, if you are overweight. For most people, losing 5-10 lbs (2-5 kg) helps lower triglyceride levels. A weight-loss plan may include. ? 30 minutes of exercise at least 5 days a week. ? Reducing the amount of calories, sugar, and fat you eat.  Eat a wide variety of fresh fruits, vegetables, and whole grains. These foods are high in fiber.  Eat foods that contain healthy fats, such as fatty fish, nuts, seeds, and olive oil.  Avoid foods that are high in added sugar, added salt (sodium), saturated fat, and trans fat.  Avoid low-fiber, refined carbohydrates such as white bread, crackers, noodles, and white rice.  Avoid foods with partially hydrogenated oils (trans fats), such as fried foods or stick margarine.  Limit alcohol intake to no more than 1 drink a day for nonpregnant women and 2 drinks a day for men. One drink equals 12 oz of beer, 5 oz of wine, or 1 oz of hard liquor. Your health care provider may recommend that you drink less depending on your overall health. Reading food labels  Check food labels for the amount of saturated fat. Choose foods with no or very little saturated fat.  Check food labels for the amount of trans fat. Choose foods with no trans fat.  Check food labels for the amount of cholesterol. Choose foods low in cholesterol. Ask your dietitian how much cholesterol you should have each day.  Check food labels for the amount of sodium. Choose foods with less than 140 milligrams (mg) per  serving. Shopping  Buy dairy products labeled as nonfat (skim) or low-fat (1%).  Avoid buying processed or prepackaged foods. These are often high in added sugar, sodium, and fat. Cooking  Choose healthy fats when cooking, such as olive oil or canola oil.  Cook foods using lower fat methods, such as baking, broiling, boiling, or grilling.  Make your own sauces, dressings, and marinades when possible, instead of buying them. Store-bought sauces, dressings, and marinades are often high in sodium and sugar. Meal planning  Eat more home-cooked food and less restaurant, buffet, and fast food.  Eat fatty fish at least 2 times each week. Examples of fatty fish include salmon, trout, mackerel, tuna, and herring.  If you eat whole eggs, do not eat more than 3 egg yolks per week. What foods are recommended? The items listed may not be a complete list. Talk with your dietitian about what dietary choices are best for you. Grains Whole wheat or whole grain breads, crackers, cereals, and pasta. Unsweetened oatmeal. Bulgur. Barley. Quinoa. Brown rice. Whole wheat flour tortillas. Vegetables Fresh or frozen vegetables. Low-sodium canned vegetables. Fruits All fresh, canned (in natural juice), or frozen fruits. Meats and other protein foods Skinless chicken or turkey. Ground chicken or turkey. Lean cuts of pork, trimmed of fat. Fish and seafood, especially salmon, trout, and herring. Egg whites. Dried beans, peas, or lentils. Unsalted nuts or seeds. Unsalted canned beans. Natural peanut or almond butter. Dairy Low-fat dairy products. Skim or low-fat (1%) milk. Reduced fat (  2%) and low-sodium cheese. Low-fat ricotta cheese. Low-fat cottage cheese. Plain, low-fat yogurt. Fats and oils Tub margarine without trans fats. Light or reduced-fat mayonnaise. Light or reduced-fat salad dressings. Avocado. Safflower, olive, sunflower, soybean, and canola oils. What foods are not recommended? The items listed  may not be a complete list. Talk with your dietitian about what dietary choices are best for you. Grains White bread. White (regular) pasta. White rice. Cornbread. Bagels. Pastries. Crackers that contain trans fat. Vegetables Creamed or fried vegetables. Vegetables in a cheese sauce. Fruits Sweetened dried fruit. Canned fruit in syrup. Fruit juice. Meats and other protein foods Fatty cuts of meat. Ribs. Chicken wings. Bacon. Sausage. Bologna. Salami. Chitterlings. Fatback. Hot dogs. Bratwurst. Packaged lunch meats. Dairy Whole or reduced-fat (2%) milk. Half-and-half. Cream cheese. Full-fat or sweetened yogurt. Full-fat cheese. Nondairy creamers. Whipped toppings. Processed cheese or cheese spreads. Cheese curds. Beverages Alcohol. Sweetened drinks, such as soda, lemonade, fruit drinks, or punches. Fats and oils Butter. Stick margarine. Lard. Shortening. Ghee. Bacon fat. Tropical oils, such as coconut, palm kernel, or palm oils. Sweets and desserts Corn syrup. Sugars. Honey. Molasses. Candy. Jam and jelly. Syrup. Sweetened cereals. Cookies. Pies. Cakes. Donuts. Muffins. Ice cream. Condiments Store-bought sauces, dressings, and marinades that are high in sugar, such as ketchup and barbecue sauce. Summary  High levels of triglycerides can increase the risk of heart disease and stroke. Choosing the right foods can help lower your triglycerides.  Eat plenty of fresh fruits, vegetables, and whole grains. Choose low-fat dairy and lean meats. Eat fatty fish at least twice a week.  Avoid processed and prepackaged foods with added sugar, sodium, saturated fat, and trans fat.  If you need suggestions or have questions about what types of food are good for you, talk with your health care provider or a dietitian. This information is not intended to replace advice given to you by your health care provider. Make sure you discuss any questions you have with your health care provider. Document Released:  04/06/2004 Document Revised: 06/01/2017 Document Reviewed: 08/22/2016 Elsevier Patient Education  2020 [employer]  

## 2019-02-05 NOTE — Progress Notes (Signed)
Subjective:  Patient ID: Charlene Patterson, female    DOB: 01/02/1946  Age: 74 y.o. MRN: [id number]  CC: Anemia and Hyperlipidemia   HPI Charlene Patterson presents for f/up - She has a history of iron deficiency anemia.  She tells me she is not compliant with iron replacement therapy.  She is not aware of any sources of blood loss.  She complains of chronic fatigue and shortness of breath.  Outpatient Medications Prior to Visit  Medication Sig Dispense Refill  . acetaminophen (TYLENOL) 650 MG CR tablet Take 650 mg by mouth every 8 (eight) hours as needed for pain.    . Calcium-Magnesium-Vitamin D (CALCIUM 1200+D3 PO) Take 2 capsules by mouth daily.    . citalopram (CELEXA) 10 MG tablet TAKE 1 TABLET EVERY DAY 90 tablet 1  . Ergocalciferol (DRISDOL PO) Take 3,000 Units by mouth daily.    . mirtazapine (REMERON) 7.5 MG tablet TAKE 1 TABLET (7.5 MG TOTAL) BY MOUTH AT BEDTIME. 90 tablet 1  . vitamin B-12 (CYANOCOBALAMIN) 1000 MCG tablet Take 1,000 mcg by mouth daily.    Marland Kitchen atorvastatin (LIPITOR) 40 MG tablet TAKE 1 TABLET EVERY DAY 90 tablet 1  . Ferrous Sulfate (RA IRON) 27 MG TABS Take 1 tablet by mouth as needed (low iron level).     . hydrocortisone-pramoxine (PROCTOFOAM HC) rectal foam Place 1 applicator rectally 2 (two) times daily. 10 g 2  . loratadine (CLARITIN) 10 MG tablet Take 10 mg by mouth at bedtime as needed (helps with sleep and headaches).    . Multiple Vitamin (MULTIVITAMIN) tablet Take 1 tablet by mouth daily.    . pantoprazole (PROTONIX) 40 MG tablet TAKE 1 TABLET EVERY DAY 90 tablet 1  . triamcinolone cream (KENALOG) 0.5 % Apply 1 application topically 3 (three) times daily. 30 g 0  . vitamin E 400 UNIT capsule Take 400 Units by mouth daily.     No facility-administered medications prior to visit.     ROS Review of Systems  Constitutional: Positive for fatigue. Negative for diaphoresis and unexpected weight change.  HENT: Negative.  Negative for trouble swallowing.    Eyes: Negative for visual disturbance.  Respiratory: Positive for shortness of breath. Negative for cough, chest tightness and wheezing.   Cardiovascular: Negative for chest pain, palpitations and leg swelling.  Gastrointestinal: Negative for abdominal pain, anal bleeding, blood in stool, constipation, diarrhea, nausea and vomiting.  Endocrine: Negative.   Genitourinary: Negative.  Negative for difficulty urinating and hematuria.  Musculoskeletal: Negative.  Negative for back pain and myalgias.  Skin: Positive for pallor.  Neurological: Negative.  Negative for dizziness, weakness and light-headedness.  Hematological: Negative for adenopathy. Does not bruise/bleed easily.  Psychiatric/Behavioral: Negative.     Objective:  BP 132/72 (BP Location: Left Arm, Patient Position: Sitting, Cuff Size: Normal)   Pulse 70   Temp 98.1 F (36.7 C) (Oral)   Ht 5\' 4"  (1.626 m)   Wt 137 lb 8 oz (62.4 kg)   SpO2 98%   BMI 23.60 kg/m   BP Readings from Last 3 Encounters:  02/05/19 132/72  09/02/18 134/82  06/04/18 112/74    Wt Readings from Last 3 Encounters:  02/05/19 137 lb 8 oz (62.4 kg)  09/02/18 138 lb (62.6 kg)  06/04/18 137 lb (62.1 kg)    Physical Exam Vitals signs reviewed.  Constitutional:      Appearance: She is not ill-appearing or diaphoretic.  HENT:     Nose: Nose normal.  Mouth/Throat:     Lips: No lesions.     Mouth: Mucous membranes are moist. Mucous membranes are pale.  Eyes:     General: No scleral icterus.    Conjunctiva/sclera: Conjunctivae normal.  Neck:     Musculoskeletal: Normal range of motion and neck supple. No neck rigidity or muscular tenderness.  Cardiovascular:     Rate and Rhythm: Normal rate and regular rhythm.     Heart sounds: No murmur.  Pulmonary:     Effort: Pulmonary effort is normal.     Breath sounds: No stridor. No wheezing, rhonchi or rales.  Abdominal:     General: Abdomen is flat. Bowel sounds are normal. There is no distension  or abdominal bruit.     Palpations: Abdomen is soft. There is no hepatomegaly, splenomegaly or mass.     Tenderness: There is no abdominal tenderness. There is no guarding.  Musculoskeletal: Normal range of motion.     Right lower leg: No edema.     Left lower leg: No edema.  Lymphadenopathy:     Cervical: No cervical adenopathy.  Skin:    General: Skin is warm and dry.     Coloration: Skin is pale.  Neurological:     General: No focal deficit present.     Mental Status: She is alert and oriented to person, place, and time. Mental status is at baseline.     Lab Results  Component Value Date   WBC 3.9 (L) 02/05/2019   HGB 9.6 (L) 02/05/2019   HCT 29.9 (L) 02/05/2019   PLT 323.0 02/05/2019   GLUCOSE 89 02/05/2019   CHOL 184 02/05/2019   TRIG 330.0 (H) 02/05/2019   HDL 35.10 (L) 02/05/2019   LDLDIRECT 62.0 02/05/2019   ALT 9 02/05/2019   AST 13 02/05/2019   NA 140 02/05/2019   K 4.1 02/05/2019   CL 107 02/05/2019   CREATININE 1.10 02/05/2019   BUN 23 02/05/2019   CO2 26 02/05/2019   TSH 2.61 02/05/2019   HGBA1C 5.6 07/30/2017    Ct Maxillofacial Ltd Wo Cm  Result Date: 06/13/2018 CLINICAL DATA:  73 year old female with acute on chronic left facial pain, pressure, swelling and headache. On antibiotics. EXAM: CT PARANASAL SINUS LIMITED WITHOUT CONTRAST TECHNIQUE: Non-contiguous multidetector CT images of the paranasal sinuses were obtained in a single plane without contrast. COMPARISON:  Brain MRI 07/01/2015. FINDINGS: Grossly stable and negative visible noncontrast brain parenchyma. Negative visible orbit and noncontrast face soft tissues. Visible bilateral tympanic cavities appear well pneumatized. The visible bilateral paranasal sinuses appear clear and stable since the 2016 MRI. There is symmetric nasal cavity mucosal thickening (series 3, image 6). Osteopenia.  No acute osseous abnormality identified. IMPRESSION: 1. Bilateral paranasal sinuses appear negative. 2. Symmetric  nasal cavity mucosal thickening raising the possibility of Rhinitis. Electronically Signed   By: Genevie Ann M.D.   On: 06/13/2018 10:48    Assessment & Plan:   Charlene Patterson was seen today for anemia and hyperlipidemia.  Diagnoses and all orders for this visit:  Hyperlipidemia with target LDL less than 130- She has achieved her LDL goal and is doing well on the statin. -     Lipid panel; Future -     TSH; Future -     Hepatic function panel; Future -     atorvastatin (LIPITOR) 40 MG tablet; Take 1 tablet (40 mg total) by mouth daily.  Hypertriglyceridemia- Her triglycerides are up to 330. I have asked her to start taking icosapent  ethyl to lower the triglycerides and to reduce the risk of complications such as pancreatitis and for CV risk reduction. -     Lipid panel; Future -     Hepatic function panel; Future -     Icosapent Ethyl (VASCEPA) 1 g CAPS; Take 2 capsules (2 g total) by mouth 2 (two) times daily.  Iron deficiency anemia due to chronic blood loss- She has symptomatic iron deficiency anemia.  I recommended that she receive a series of iron infusions. -     CBC with Differential/Platelet; Future -     IBC panel; Future -     Ferritin; Future  Vitamin D deficiency -     VITAMIN D 25 Hydroxy (Vit-D Deficiency, Fractures); Future  Hyperglycemia -     Basic metabolic panel; Future  Chronic renal disease, stage 3, moderately decreased glomerular filtration rate (GFR) between 30-59 mL/min/1.73 square meter ([place])- She will avoid nephrotoxic agents.   Other dietary vitamin B12 deficiency anemia -     cyanocobalamin 2000 MCG tablet; Take 1 tablet (2,000 mcg total) by mouth daily.  Gastroesophageal reflux disease without esophagitis -     pantoprazole (PROTONIX) 40 MG tablet; Take 1 tablet (40 mg total) by mouth daily.   I have discontinued Charlene Patterson's Ferrous Sulfate, vitamin E, loratadine, hydrocortisone-pramoxine, multivitamin, and triamcinolone cream. I have also changed her  pantoprazole and atorvastatin. Additionally, I am having her start on cyanocobalamin and Vascepa. Lastly, I am having her maintain her Calcium-Magnesium-Vitamin D (CALCIUM 1200+D3 PO), acetaminophen, vitamin B-12, Ergocalciferol (DRISDOL PO), mirtazapine, and citalopram.  Meds ordered this encounter  Medications  . cyanocobalamin 2000 MCG tablet    Sig: Take 1 tablet (2,000 mcg total) by mouth daily.    Dispense:  90 tablet    Refill:  1  . pantoprazole (PROTONIX) 40 MG tablet    Sig: Take 1 tablet (40 mg total) by mouth daily.    Dispense:  90 tablet    Refill:  1  . atorvastatin (LIPITOR) 40 MG tablet    Sig: Take 1 tablet (40 mg total) by mouth daily.    Dispense:  90 tablet    Refill:  1  . Icosapent Ethyl (VASCEPA) 1 g CAPS    Sig: Take 2 capsules (2 g total) by mouth 2 (two) times daily.    Dispense:  360 capsule    Refill:  1     Follow-up: Return in about 6 months (around 08/08/2019).  Scarlette Calico, MD

## 2019-02-10 ENCOUNTER — Ambulatory Visit (HOSPITAL_COMMUNITY)
Admission: RE | Admit: 2019-02-10 | Discharge: 2019-02-10 | Disposition: A | Discharge status: Home / Self Care | Payer: Medicare HMO | Source: Ambulatory Visit | Attending: Internal Medicine | Admitting: Internal Medicine

## 2019-02-10 ENCOUNTER — Other Ambulatory Visit: Payer: Self-pay

## 2019-02-10 DIAGNOSIS — D500 Iron deficiency anemia secondary to blood loss (chronic): Secondary | ICD-10-CM | POA: Diagnosis not present

## 2019-02-10 MED ORDER — SODIUM CHLORIDE 0.9 % IV SOLN
INTRAVENOUS | Status: DC | PRN
  Administered 2019-02-10: 250 mL via INTRAVENOUS

## 2019-02-10 MED ORDER — SODIUM CHLORIDE 0.9 % IV SOLN
750.0000 mg | Freq: Once | INTRAVENOUS | Status: AC
  Administered 2019-02-10: 750 mg via INTRAVENOUS
  Filled 2019-02-10: qty 15

## 2019-02-10 NOTE — Progress Notes (Signed)
PATIENT CARE CENTER NOTE   DIAGNOSIS: Iron Deficiency Anemia      PROVIDER:  Scarlette Calico, MD    PROCEDURE: IV Feraheme      NOTE: Pt presented to day hospital for IV Feraheme infusion. No complications with infusion. Observed pt 30 min post-infusion. No adverse reactions noted post infusion. Vital signs stable. AVS given. Pt Alert, Oriented, and ambulatory at discharge.

## 2019-02-10 NOTE — Discharge Instructions (Signed)

## 2019-02-18 ENCOUNTER — Other Ambulatory Visit: Payer: Self-pay

## 2019-02-18 ENCOUNTER — Ambulatory Visit (HOSPITAL_COMMUNITY)
Admission: RE | Admit: 2019-02-18 | Discharge: 2019-02-18 | Disposition: A | Discharge status: Home / Self Care | Payer: Medicare HMO | Source: Ambulatory Visit | Attending: Internal Medicine | Admitting: Internal Medicine

## 2019-02-18 DIAGNOSIS — D500 Iron deficiency anemia secondary to blood loss (chronic): Secondary | ICD-10-CM | POA: Diagnosis not present

## 2019-02-18 MED ORDER — SODIUM CHLORIDE 0.9 % IV SOLN
750.0000 mg | Freq: Once | INTRAVENOUS | Status: AC
  Administered 2019-02-18: 750 mg via INTRAVENOUS
  Filled 2019-02-18: qty 15

## 2019-02-18 MED ORDER — SODIUM CHLORIDE 0.9 % IV SOLN
INTRAVENOUS | Status: DC | PRN
  Administered 2019-02-18: 11:00:00 250 mL via INTRAVENOUS

## 2019-02-18 NOTE — Progress Notes (Signed)
PATIENT CARE CENTER NOTE             DIAGNOSIS:Iron Deficiency Anemia   PROVIDER: Scarlette Calico, MD   PROCEDURE:IV Feraheme   NOTE:Patient received infusion of Feraheme via PIV. Tolerated well with no adverse reaction. Patient observed for 30 minutes post-infusion. Vital signs stable. Discharge instructions given. Patient alert, oriented and ambulatory at discharge.

## 2019-02-18 NOTE — Discharge Instructions (Signed)

## 2019-02-26 ENCOUNTER — Other Ambulatory Visit: Payer: Self-pay | Admitting: Internal Medicine

## 2019-02-26 DIAGNOSIS — Z1231 Encounter for screening mammogram for malignant neoplasm of breast: Secondary | ICD-10-CM

## 2019-02-27 ENCOUNTER — Ambulatory Visit
Admission: RE | Admit: 2019-02-27 | Discharge: 2019-02-27 | Disposition: A | Discharge status: Home / Self Care | Payer: Medicare HMO | Source: Ambulatory Visit | Attending: Internal Medicine | Admitting: Internal Medicine

## 2019-02-27 ENCOUNTER — Other Ambulatory Visit: Payer: Self-pay

## 2019-02-27 DIAGNOSIS — Z1231 Encounter for screening mammogram for malignant neoplasm of breast: Secondary | ICD-10-CM | POA: Diagnosis not present

## 2019-02-28 LAB — HM MAMMOGRAPHY

## 2019-03-01 ENCOUNTER — Encounter (HOSPITAL_COMMUNITY): Payer: Self-pay

## 2019-03-01 ENCOUNTER — Other Ambulatory Visit: Payer: Self-pay

## 2019-03-01 ENCOUNTER — Ambulatory Visit (HOSPITAL_COMMUNITY)
Admission: EM | Admit: 2019-03-01 | Discharge: 2019-03-01 | Disposition: A | Discharge status: Home / Self Care | Payer: Medicare HMO

## 2019-03-01 DIAGNOSIS — R519 Headache, unspecified: Secondary | ICD-10-CM

## 2019-03-01 DIAGNOSIS — R51 Headache: Secondary | ICD-10-CM | POA: Diagnosis not present

## 2019-03-01 NOTE — Discharge Instructions (Addendum)
Continue to take Tylenol as needed for your headache.    Return here or go to the emergency department if you develop changes in your vision, weakness, dizziness, earache, sore throat, cough, shortness of breath, or other concerning symptoms.    Follow-up with your primary care provider on Monday.

## 2019-03-01 NOTE — ED Provider Notes (Signed)
[facility]    CSN: [id number] Arrival date & time: 03/01/19  1215      History   Chief Complaint Chief Complaint  Patient presents with   Headache    HPI Charlene Patterson is a 73 y.o. female.   Patient presents with a headache in the back of her head since last evening.  She has a history of migraine headaches but states this does not feel like her usual.  She denies falls or injury.  She denies vision changes, weakness, dizziness, ear pain, sore throat, cough, shortness of breath, or other symptoms.  She has treated her headache with over-the-counter muscle cream rubbed into her neck without relief.  This morning she took Tylenol which has improved her headache.     The history is provided by the patient.    Past Medical History:  Diagnosis Date   Allergy    Anxiety    Arthritis    "left foot" (05/26/2014)   Chronic back pain    Chronic neck pain    Depression    GERD (gastroesophageal reflux disease)    History of chicken pox    History of colon polyps    History of hiatal hernia    Lower GI bleeding    Migraines    "monthly usually; weekly lately" (05/26/2014)   Sciatica    Thoracic compression fracture [facility])     Patient Active Problem List   Diagnosis Date Noted   Chronic renal disease, stage 3, moderately decreased glomerular filtration rate (GFR) between 30-59 mL/min/1.73 square meter ([place]) 02/05/2019   Other dietary vitamin B12 deficiency anemia 02/05/2019   Iron deficiency anemia due to chronic blood loss 07/30/2017   Hyperglycemia 07/30/2017   Episode of recurrent major depressive disorder ([place]) 01/05/2017   Routine general medical examination at a health care facility 02/08/2016   Hypertriglyceridemia 05/11/2015   Memory loss 05/11/2015   Hyperlipidemia with target LDL less than 130 10/29/2014   Frequent PVCs 10/29/2014   Vitamin D deficiency 09/24/2014   Colitis 05/25/2014   Allergic rhinitis 05/25/2014    Spinal stenosis of lumbar region with radiculopathy 05/25/2014   Cervical radiculitis 05/12/2014   Thoracic compression fracture ([place]) 05/12/2014   Visit for screening mammogram 05/10/2014   GERD (gastroesophageal reflux disease) 12/13/2012   Osteoporosis 12/13/2012   Depression with somatization 12/13/2012    Past Surgical History:  Procedure Laterality Date   ABDOMINAL HYSTERECTOMY  ~ 1978   "partial"   APPENDECTOMY  ~ 1978   COLONOSCOPY  2005   last 2005   POLYPECTOMY      OB History   No obstetric history on file.      Home Medications    Prior to Admission medications   Medication Sig Start Date End Date Taking? Authorizing Provider  acetaminophen (TYLENOL) 650 MG CR tablet Take 650 mg by mouth every 8 (eight) hours as needed for pain.    [provider]  atorvastatin (LIPITOR) 40 MG tablet Take 1 tablet (40 mg total) by mouth daily. 02/05/19   Janith Lima, MD  Calcium-Magnesium-Vitamin D (CALCIUM 1200+D3 PO) Take 2 capsules by mouth daily.    [provider]  citalopram (CELEXA) 10 MG tablet TAKE 1 TABLET EVERY DAY 11/17/18   Janith Lima, MD  cyanocobalamin 2000 MCG tablet Take 1 tablet (2,000 mcg total) by mouth daily. 02/05/19   Janith Lima, MD  Ergocalciferol (DRISDOL PO) Take 3,000 Units by mouth daily.    [provider]  Icosapent Ethyl (VASCEPA) 1 g CAPS Take 2 capsules (2 g total) by mouth 2 (two) times daily. 02/05/19   Janith Lima, MD  mirtazapine (REMERON) 7.5 MG tablet TAKE 1 TABLET (7.5 MG TOTAL) BY MOUTH AT BEDTIME. 11/17/18   Janith Lima, MD  pantoprazole (PROTONIX) 40 MG tablet Take 1 tablet (40 mg total) by mouth daily. 02/05/19   Janith Lima, MD  vitamin B-12 (CYANOCOBALAMIN) 1000 MCG tablet Take 1,000 mcg by mouth daily.    [provider]    Family History Family History  Problem Relation Age of Onset   Heart disease Mother    Heart disease Father    Prostate cancer Father     Diabetes Sister    Stomach cancer Sister 79   Diabetes Brother    Heart disease Brother    Rectal cancer Neg Hx    Colon cancer Neg Hx     Social History Social History   Tobacco Use   Smoking status: Never Smoker   Smokeless tobacco: Never Used  Substance Use Topics   Alcohol use: No    Alcohol/week: 0.0 standard drinks   Drug use: No     Allergies   Patient has no known allergies.   Review of Systems Review of Systems  Constitutional: Negative for chills and fever.  HENT: Negative for ear pain and sore throat.   Eyes: Negative for pain and visual disturbance.  Respiratory: Negative for cough and shortness of breath.   Cardiovascular: Negative for chest pain and palpitations.  Gastrointestinal: Negative for abdominal pain and vomiting.  Genitourinary: Negative for dysuria and hematuria.  Musculoskeletal: Negative for arthralgias and back pain.  Skin: Negative for color change and rash.  Neurological: Positive for headaches. Negative for dizziness, tremors, seizures, syncope, facial asymmetry, speech difficulty, weakness, light-headedness and numbness.  All other systems reviewed and are negative.    Physical Exam Triage Vital Signs ED Triage Vitals  Enc Vitals Group     BP 03/01/19 1256 125/72     Pulse Rate 03/01/19 1256 67     Resp 03/01/19 1256 17     Temp 03/01/19 1256 98.4 F (36.9 C)     Temp Source 03/01/19 1256 Oral     SpO2 03/01/19 1256 100 %     Weight --      Height --      Head Circumference --      Peak Flow --      Pain Score 03/01/19 1254 7     Pain Loc --      Pain Edu? --      Excl. in [place]? --    No data found.  Updated Vital Signs BP 125/72 (BP Location: Right Arm)    Pulse 67    Temp 98.4 F (36.9 C) (Oral)    Resp 17    SpO2 100%   Visual Acuity Right Eye Distance:   Left Eye Distance:   Bilateral Distance:    Right Eye Near:   Left Eye Near:    Bilateral Near:     Physical Exam Vitals signs and nursing note  reviewed.  Constitutional:      General: She is not in acute distress.    Appearance: She is well-developed. She is not ill-appearing.  HENT:     Head: Normocephalic and atraumatic.     Right Ear: Tympanic membrane normal.     Left Ear: Tympanic membrane normal.     Nose: Nose  normal.     Mouth/Throat:     Mouth: Mucous membranes are moist.     Pharynx: Oropharynx is clear.  Eyes:     Conjunctiva/sclera: Conjunctivae normal.  Neck:     Musculoskeletal: Neck supple.  Cardiovascular:     Rate and Rhythm: Normal rate and regular rhythm.     Heart sounds: No murmur.  Pulmonary:     Effort: Pulmonary effort is normal. No respiratory distress.     Breath sounds: Normal breath sounds.  Abdominal:     General: Bowel sounds are normal.     Palpations: Abdomen is soft.     Tenderness: There is no abdominal tenderness. There is no guarding or rebound.  Skin:    General: Skin is warm and dry.  Neurological:     General: No focal deficit present.     Mental Status: She is alert and oriented to person, place, and time.     Cranial Nerves: No cranial nerve deficit.     Sensory: No sensory deficit.     Motor: No weakness.     Coordination: Coordination normal.     Gait: Gait normal.     Deep Tendon Reflexes: Reflexes normal.      UC Treatments / Results  Labs (all labs ordered are listed, but only abnormal results are displayed) Labs Reviewed - No data to display  EKG   Radiology Mm 3d Screen Breast Bilateral  Result Date: 02/28/2019 CLINICAL DATA:  Screening. EXAM: DIGITAL SCREENING BILATERAL MAMMOGRAM WITH TOMO AND CAD COMPARISON:  Previous exam(s). ACR Breast Density Category c: The breast tissue is heterogeneously dense, which may obscure small masses. FINDINGS: There are no findings suspicious for malignancy. Images were processed with CAD. IMPRESSION: No mammographic evidence of malignancy. A result letter of this screening mammogram will be mailed directly to the patient.  RECOMMENDATION: Screening mammogram in one year. (Code:SM-B-01Y) BI-RADS CATEGORY  1: Negative. Electronically Signed   By: Nolon Nations M.D.   On: 02/28/2019 10:07    Procedures Procedures (including critical care time)  Medications Ordered in UC Medications - No data to display  Initial Impression / Assessment and Plan / UC Course  I have reviewed the triage vital signs and the nursing notes.  Pertinent labs & imaging results that were available during my care of the patient were reviewed by me and considered in my medical decision making (see chart for details).    Headache, improved with Tylenol.  Instructed patient to continue to take Tylenol as needed for her headache.  Instructed her to follow-up with her PCP on Monday.  Discussed that she could return here or go to the emergency department if she develops worsening pain, vision changes, weakness, dizziness, otalgia, sore throat, cough, shortness of breath, or other concerning symptoms.  Patient agrees with plan of care.     Final Clinical Impressions(s) / UC Diagnoses   Final diagnoses:  Acute nonintractable headache, unspecified headache type     Discharge Instructions     Continue to take Tylenol as needed for your headache.    Return here or go to the emergency department if you develop changes in your vision, weakness, dizziness, earache, sore throat, cough, shortness of breath, or other concerning symptoms.    Follow-up with your primary care provider on Monday.        ED Prescriptions    None     Controlled Substance Prescriptions [employer] Controlled Substance Registry consulted? Not Applicable   Sharion Balloon, NP  03/01/19 1334 ° °

## 2019-03-01 NOTE — ED Triage Notes (Signed)
Patient presents to Urgent Care with complaints of posterior headache near her neck since yesterday. Patient reports the pain has now moved further up her head, pt has taken tylenol arthritis for pain today.

## 2019-05-13 DIAGNOSIS — R69 Illness, unspecified: Secondary | ICD-10-CM | POA: Diagnosis not present

## 2019-05-16 ENCOUNTER — Other Ambulatory Visit: Payer: Self-pay | Admitting: Internal Medicine

## 2019-05-16 DIAGNOSIS — F329 Major depressive disorder, single episode, unspecified: Secondary | ICD-10-CM

## 2019-05-16 DIAGNOSIS — F32A Depression, unspecified: Secondary | ICD-10-CM

## 2019-05-16 DIAGNOSIS — F450 Somatization disorder: Secondary | ICD-10-CM

## 2019-05-16 MED ORDER — CITALOPRAM HYDROBROMIDE 10 MG PO TABS: 10.0000 mg | ORAL_TABLET | Freq: Every day | ORAL | 0 refills | Status: DC

## 2019-05-16 MED ORDER — MIRTAZAPINE 7.5 MG PO TABS: 7.5000 mg | ORAL_TABLET | Freq: Every day | ORAL | 0 refills | Status: DC

## 2019-05-16 NOTE — Telephone Encounter (Signed)
Copied from [place] [id number]. Topic: Quick Communication - Rx Refill/Question >> May 16, 2019  1:59 PM Mcneil, Ja-Kwan wrote: Medication: citalopram (CELEXA) 10 MG tablet and mirtazapine (REMERON) 7.5 MG tablet  Has the patient contacted their pharmacy? yes   Preferred Pharmacy (with phone number or street name): [facility], [place] [phone] (Phone) [phone] (Fax)  Agent: Please be advised that RX refills may take up to 3 business days. We ask that you follow-up with your pharmacy.

## 2019-08-14 ENCOUNTER — Ambulatory Visit (INDEPENDENT_AMBULATORY_CARE_PROVIDER_SITE_OTHER): Payer: Medicare HMO | Admitting: Internal Medicine

## 2019-08-14 ENCOUNTER — Other Ambulatory Visit: Payer: Self-pay

## 2019-08-14 ENCOUNTER — Encounter: Payer: Self-pay | Admitting: Internal Medicine

## 2019-08-14 VITALS — BP 148/78 | HR 75 | Temp 97.8°F | Ht 64.0 in | Wt 142.0 lb

## 2019-08-14 DIAGNOSIS — Z23 Encounter for immunization: Secondary | ICD-10-CM

## 2019-08-14 DIAGNOSIS — M4802 Spinal stenosis, cervical region: Secondary | ICD-10-CM | POA: Diagnosis not present

## 2019-08-14 DIAGNOSIS — E785 Hyperlipidemia, unspecified: Secondary | ICD-10-CM

## 2019-08-14 DIAGNOSIS — M48061 Spinal stenosis, lumbar region without neurogenic claudication: Secondary | ICD-10-CM | POA: Diagnosis not present

## 2019-08-14 DIAGNOSIS — D513 Other dietary vitamin B12 deficiency anemia: Secondary | ICD-10-CM

## 2019-08-14 DIAGNOSIS — K219 Gastro-esophageal reflux disease without esophagitis: Secondary | ICD-10-CM

## 2019-08-14 DIAGNOSIS — F450 Somatization disorder: Secondary | ICD-10-CM

## 2019-08-14 DIAGNOSIS — E781 Pure hyperglyceridemia: Secondary | ICD-10-CM | POA: Diagnosis not present

## 2019-08-14 DIAGNOSIS — F32A Depression, unspecified: Secondary | ICD-10-CM

## 2019-08-14 DIAGNOSIS — D500 Iron deficiency anemia secondary to blood loss (chronic): Secondary | ICD-10-CM | POA: Diagnosis not present

## 2019-08-14 DIAGNOSIS — E559 Vitamin D deficiency, unspecified: Secondary | ICD-10-CM

## 2019-08-14 DIAGNOSIS — F329 Major depressive disorder, single episode, unspecified: Secondary | ICD-10-CM

## 2019-08-14 HISTORY — DX: Spinal stenosis, cervical region: M48.02

## 2019-08-14 LAB — CBC WITH DIFFERENTIAL/PLATELET
Basophils Absolute: 0 10*3/uL (ref 0.0–0.1)
Basophils Relative: 0.5 % (ref 0.0–3.0)
Eosinophils Absolute: 0.1 10*3/uL (ref 0.0–0.7)
Eosinophils Relative: 1.9 % (ref 0.0–5.0)
HCT: 39.8 % (ref 36.0–46.0)
Hemoglobin: 13.4 g/dL (ref 12.0–15.0)
Lymphocytes Relative: 33.3 % (ref 12.0–46.0)
Lymphs Abs: 1.4 10*3/uL (ref 0.7–4.0)
MCHC: 33.8 g/dL (ref 30.0–36.0)
MCV: 91.2 fl (ref 78.0–100.0)
Monocytes Absolute: 0.3 10*3/uL (ref 0.1–1.0)
Monocytes Relative: 6.6 % (ref 3.0–12.0)
Neutro Abs: 2.4 10*3/uL (ref 1.4–7.7)
Neutrophils Relative %: 57.7 % (ref 43.0–77.0)
Platelets: 258 10*3/uL (ref 150.0–400.0)
RBC: 4.36 Mil/uL (ref 3.87–5.11)
RDW: 13.6 % (ref 11.5–15.5)
WBC: 4.2 10*3/uL (ref 4.0–10.5)

## 2019-08-14 LAB — LDL CHOLESTEROL, DIRECT: Direct LDL: 99 mg/dL

## 2019-08-14 LAB — FOLATE: Folate: 8.3 ng/mL (ref >5.9)

## 2019-08-14 LAB — VITAMIN B12: Vitamin B-12: >1500 pg/mL — ABNORMAL HIGH (ref 211–911)

## 2019-08-14 LAB — IBC PANEL
Iron: 62 ug/dL (ref 42–145)
Saturation Ratios: 20.5 % (ref 20.0–50.0)
Transferrin: 216 mg/dL (ref 212.0–360.0)

## 2019-08-14 LAB — LIPID PANEL
Cholesterol: 202 mg/dL — ABNORMAL HIGH (ref 0–200)
HDL: 42.2 mg/dL (ref >39.00)
NonHDL: 159.71
Total CHOL/HDL Ratio: 5
Triglycerides: 288 mg/dL — ABNORMAL HIGH (ref 0.0–149.0)
VLDL: 57.6 mg/dL — ABNORMAL HIGH (ref 0.0–40.0)

## 2019-08-14 LAB — FERRITIN: Ferritin: 76.7 ng/mL (ref 10.0–291.0)

## 2019-08-14 LAB — VITAMIN D 25 HYDROXY (VIT D DEFICIENCY, FRACTURES): VITD: 30.11 ng/mL (ref 30.00–100.00)

## 2019-08-14 MED ORDER — PANTOPRAZOLE SODIUM 40 MG PO TBEC: 40.0000 mg | DELAYED_RELEASE_TABLET | Freq: Every day | ORAL | 1 refills | Status: DC

## 2019-08-14 MED ORDER — MIRTAZAPINE 7.5 MG PO TABS: 7.5000 mg | ORAL_TABLET | Freq: Every day | ORAL | 0 refills | Status: DC

## 2019-08-14 MED ORDER — CITALOPRAM HYDROBROMIDE 10 MG PO TABS: 10.0000 mg | ORAL_TABLET | Freq: Every day | ORAL | 0 refills | Status: DC

## 2019-08-14 MED ORDER — ATORVASTATIN CALCIUM 40 MG PO TABS: 40.0000 mg | ORAL_TABLET | Freq: Every day | ORAL | 1 refills | Status: DC

## 2019-08-14 MED ORDER — CYANOCOBALAMIN 2000 MCG PO TABS: 2000.0000 ug | ORAL_TABLET | Freq: Every day | ORAL | 1 refills | Status: DC

## 2019-08-14 MED ORDER — ICOSAPENT ETHYL 1 G PO CAPS: 2.0000 g | ORAL_CAPSULE | Freq: Two times a day (BID) | ORAL | 1 refills | Status: DC

## 2019-08-14 MED ORDER — ZOSTER VAC RECOMB ADJUVANTED 50 MCG/0.5ML IM SUSR: 0.5000 mL | Freq: Once | INTRAMUSCULAR | 1 refills | Status: AC

## 2019-08-14 MED ORDER — TRAMADOL HCL 50 MG PO TABS: 50.0000 mg | ORAL_TABLET | Freq: Three times a day (TID) | ORAL | 2 refills | Status: AC | PRN

## 2019-08-14 NOTE — Progress Notes (Signed)
Subjective:  Patient ID: Charlene Patterson, female    DOB: 1946-02-23  Age: 74 y.o. MRN: [id number]  CC: Anemia   This visit occurred during the SARS-CoV-2 public health emergency.  Safety protocols were in place, including screening questions prior to the visit, additional usage of staff PPE, and extensive cleaning of exam room while observing appropriate contact time as indicated for disinfecting solutions.    HPI Charlene Patterson presents for f/up - She continues to complain of neck and low back pain.  The pain keeps her awake at night and interferes with her daily activities.  She says the neck pain radiates into her right lower extremity and the low back pain radiates into the left lower extremity.  She is not getting much symptom relief with Tylenol.  NSAIDs are contraindicated due to her GI history.  She denies paresthesias in her arms or legs.  She has had previous x-rays done that documented DDD with spinal stenosis in the cervical and lumbar spines.  Outpatient Medications Prior to Visit  Medication Sig Dispense Refill  . acetaminophen (TYLENOL) 650 MG CR tablet Take 650 mg by mouth every 8 (eight) hours as needed for pain.    . Calcium-Magnesium-Vitamin D (CALCIUM 1200+D3 PO) Take 2 capsules by mouth daily.    . Ergocalciferol (DRISDOL PO) Take 3,000 Units by mouth daily.    Marland Kitchen atorvastatin (LIPITOR) 40 MG tablet Take 1 tablet (40 mg total) by mouth daily. 90 tablet 1  . citalopram (CELEXA) 10 MG tablet Take 1 tablet (10 mg total) by mouth daily. 90 tablet 0  . cyanocobalamin 2000 MCG tablet Take 1 tablet (2,000 mcg total) by mouth daily. 90 tablet 1  . Icosapent Ethyl (VASCEPA) 1 g CAPS Take 2 capsules (2 g total) by mouth 2 (two) times daily. 360 capsule 1  . mirtazapine (REMERON) 7.5 MG tablet Take 1 tablet (7.5 mg total) by mouth at bedtime. 90 tablet 0  . pantoprazole (PROTONIX) 40 MG tablet Take 1 tablet (40 mg total) by mouth daily. 90 tablet 1  . vitamin B-12 (CYANOCOBALAMIN)  1000 MCG tablet Take 1,000 mcg by mouth daily.     No facility-administered medications prior to visit.    ROS Review of Systems  Constitutional: Negative.  Negative for diaphoresis, fatigue and unexpected weight change.  HENT: Negative.  Negative for trouble swallowing and voice change.   Eyes: Negative.   Respiratory: Negative for cough, chest tightness, shortness of breath and wheezing.   Cardiovascular: Negative for palpitations and leg swelling.  Gastrointestinal: Negative for abdominal pain, constipation, diarrhea, nausea and vomiting.  Endocrine: Negative.   Genitourinary: Negative.  Negative for difficulty urinating.  Musculoskeletal: Positive for back pain and neck pain. Negative for arthralgias and myalgias.  Skin: Negative.  Negative for color change.  Neurological: Negative for dizziness, weakness, light-headedness, numbness and headaches.  Hematological: Negative for adenopathy. Does not bruise/bleed easily.  Psychiatric/Behavioral: Negative.     Objective:  BP (!) 148/78 (BP Location: Left Arm, Patient Position: Sitting, Cuff Size: Normal)   Pulse 75   Temp 97.8 F (36.6 C) (Oral)   Ht 5\' 4"  (1.626 m)   Wt 142 lb (64.4 kg)   SpO2 99%   BMI 24.37 kg/m   BP Readings from Last 3 Encounters:  08/14/19 (!) 148/78  03/01/19 125/72  02/18/19 129/69    Wt Readings from Last 3 Encounters:  08/14/19 142 lb (64.4 kg)  02/05/19 137 lb 8 oz (62.4 kg)  09/02/18 138 lb (  62.6 kg)    Physical Exam Vitals reviewed.  Constitutional:      Appearance: Normal appearance.  HENT:     Nose: Nose normal.     Mouth/Throat:     Mouth: Mucous membranes are moist.  Eyes:     General: No scleral icterus.    Conjunctiva/sclera: Conjunctivae normal.  Cardiovascular:     Rate and Rhythm: Normal rate and regular rhythm.     Pulses: Normal pulses.     Heart sounds: No murmur.  Pulmonary:     Effort: Pulmonary effort is normal.     Breath sounds: No stridor. No wheezing,  rhonchi or rales.  Abdominal:     General: Abdomen is flat.     Palpations: There is no mass.     Tenderness: There is no abdominal tenderness.  Musculoskeletal:        General: Normal range of motion.     Cervical back: Normal and neck supple. No deformity or tenderness. Normal range of motion.     Thoracic back: Normal.     Lumbar back: Normal. No deformity, tenderness or bony tenderness. Normal range of motion. Negative right straight leg raise test and negative left straight leg raise test.     Right lower leg: No edema.     Left lower leg: No edema.  Skin:    General: Skin is warm and dry.     Findings: No rash.  Neurological:     General: No focal deficit present.     Mental Status: She is alert and oriented to person, place, and time. Mental status is at baseline.     Cranial Nerves: Cranial nerves are intact.     Sensory: Sensation is intact.     Motor: Motor function is intact. No weakness or atrophy.     Coordination: Coordination is intact.     Gait: Gait normal.     Deep Tendon Reflexes: Reflexes normal.     Reflex Scores:      Tricep reflexes are 1+ on the right side and 1+ on the left side.      Bicep reflexes are 1+ on the right side and 1+ on the left side.      Brachioradialis reflexes are 1+ on the right side and 1+ on the left side.      Patellar reflexes are 1+ on the right side and 1+ on the left side.      Achilles reflexes are 1+ on the right side and 1+ on the left side. Psychiatric:        Mood and Affect: Mood normal.        Behavior: Behavior normal.     Lab Results  Component Value Date   WBC 3.9 (L) 02/05/2019   HGB 9.6 (L) 02/05/2019   HCT 29.9 (L) 02/05/2019   PLT 323.0 02/05/2019   GLUCOSE 89 02/05/2019   CHOL 184 02/05/2019   TRIG 330.0 (H) 02/05/2019   HDL 35.10 (L) 02/05/2019   LDLDIRECT 62.0 02/05/2019   ALT 9 02/05/2019   AST 13 02/05/2019   NA 140 02/05/2019   K 4.1 02/05/2019   CL 107 02/05/2019   CREATININE 1.10 02/05/2019    BUN 23 02/05/2019   CO2 26 02/05/2019   TSH 2.61 02/05/2019   HGBA1C 5.6 07/30/2017    No results found.  Assessment & Plan:   Charlene Patterson was seen today for anemia.  Diagnoses and all orders for this visit:  Spinal stenosis in cervical region-  She has radiating pain but is neurologically intact.  NSAIDs are contraindicated.  She is not interested in physical therapy, epidural steroid injections, or surgical interventions.  Will try to control her discomfort with tramadol. -     traMADol (ULTRAM) 50 MG tablet; Take 1 tablet (50 mg total) by mouth every 8 (eight) hours as needed for up to 5 days.  Hyperlipidemia with target LDL less than 130- She has achieved her LDL goal and is doing well on the statin. -     atorvastatin (LIPITOR) 40 MG tablet; Take 1 tablet (40 mg total) by mouth daily. -     Lipid panel  Depression with somatization - She is doing well on the combination of citalopram and mirtazapine.  Will continue. -     citalopram (CELEXA) 10 MG tablet; Take 1 tablet (10 mg total) by mouth daily. -     mirtazapine (REMERON) 7.5 MG tablet; Take 1 tablet (7.5 mg total) by mouth at bedtime.  Other dietary vitamin B12 deficiency anemia- Her H&H is normal.  Her B12 and folate levels are normal.  Will continue oral high-dose B12 replacement therapy. -     cyanocobalamin 2000 MCG tablet; Take 1 tablet (2,000 mcg total) by mouth daily. -     CBC with Differential/Platelet -     Vitamin B12 -     Folate  Hypertriglyceridemia- Her triglycerides are elevated at 330.  I recommended that she treat this with icosapent ethyl to reduce the risk of complications like pancreatitis and for cardiovascular risk reduction. -     icosapent Ethyl (VASCEPA) 1 g capsule; Take 2 capsules (2 g total) by mouth 2 (two) times daily.  Gastroesophageal reflux disease without esophagitis- Her symptoms are well controlled with the PPI. -     pantoprazole (PROTONIX) 40 MG tablet; Take 1 tablet (40 mg total) by mouth  daily.  Spinal stenosis of lumbar region at multiple levels- She has radiating pain but is neurologically intact.  NSAIDs are contraindicated.  She is not interested in physical therapy, epidural steroid injections, or surgical interventions.  Will try to control her discomfort with tramadol. -     traMADol (ULTRAM) 50 MG tablet; Take 1 tablet (50 mg total) by mouth every 8 (eight) hours as needed for up to 5 days.  Iron deficiency anemia due to chronic blood loss- Her H&H and iron levels are normal now. -     CBC with Differential/Platelet -     IBC panel -     Ferritin  Vitamin D deficiency- Her vitamin D level is normal now. -     Cancel: VITAMIN D 25 Hydroxy (Vit-D Deficiency, Fractures) -     VITAMIN D 25 Hydroxy (Vit-D Deficiency, Fractures)  Other orders -     Zoster Vaccine Adjuvanted [facility]) injection; Inject 0.5 mLs into the muscle once for 1 dose.   I have discontinued Charlene Patterson's vitamin B-12. I have also changed her Vascepa to icosapent Ethyl. Additionally, I am having her start on Zoster Vaccine Adjuvanted and traMADol. Lastly, I am having her maintain her Calcium-Magnesium-Vitamin D (CALCIUM 1200+D3 PO), acetaminophen, Ergocalciferol (DRISDOL PO), atorvastatin, citalopram, cyanocobalamin, mirtazapine, and pantoprazole.  Meds ordered this encounter  Medications  . Zoster Vaccine Adjuvanted [facility]) injection    Sig: Inject 0.5 mLs into the muscle once for 1 dose.    Dispense:  0.5 mL    Refill:  1  . atorvastatin (LIPITOR) 40 MG tablet    Sig: Take 1  tablet (40 mg total) by mouth daily.    Dispense:  90 tablet    Refill:  1  . citalopram (CELEXA) 10 MG tablet    Sig: Take 1 tablet (10 mg total) by mouth daily.    Dispense:  90 tablet    Refill:  0  . cyanocobalamin 2000 MCG tablet    Sig: Take 1 tablet (2,000 mcg total) by mouth daily.    Dispense:  90 tablet    Refill:  1  . mirtazapine (REMERON) 7.5 MG tablet    Sig: Take 1 tablet (7.5 mg total) by  mouth at bedtime.    Dispense:  90 tablet    Refill:  0  . icosapent Ethyl (VASCEPA) 1 g capsule    Sig: Take 2 capsules (2 g total) by mouth 2 (two) times daily.    Dispense:  360 capsule    Refill:  1  . pantoprazole (PROTONIX) 40 MG tablet    Sig: Take 1 tablet (40 mg total) by mouth daily.    Dispense:  90 tablet    Refill:  1  . traMADol (ULTRAM) 50 MG tablet    Sig: Take 1 tablet (50 mg total) by mouth every 8 (eight) hours as needed for up to 5 days.    Dispense:  90 tablet    Refill:  2     Follow-up: No follow-ups on file.  Scarlette Calico, MD

## 2019-08-14 NOTE — Patient Instructions (Signed)

## 2019-08-15 ENCOUNTER — Encounter: Payer: Self-pay | Admitting: Internal Medicine

## 2019-08-20 ENCOUNTER — Other Ambulatory Visit: Payer: Self-pay | Admitting: Internal Medicine

## 2019-08-20 ENCOUNTER — Telehealth: Payer: Self-pay | Admitting: Internal Medicine

## 2019-08-20 DIAGNOSIS — M48061 Spinal stenosis, lumbar region without neurogenic claudication: Secondary | ICD-10-CM

## 2019-08-20 DIAGNOSIS — M4802 Spinal stenosis, cervical region: Secondary | ICD-10-CM

## 2019-08-20 NOTE — Telephone Encounter (Signed)
Patient was seen on 2/11 and has choose to have the back treatment/surgery that was discussed during the [facility]. She would like to go forward.

## 2019-08-22 ENCOUNTER — Telehealth: Payer: Self-pay | Admitting: Internal Medicine

## 2019-08-22 NOTE — Chronic Care Management (AMB) (Signed)
  Chronic Care Management   Note  08/22/2019 Name: Charlene Patterson MRN: [id number] DOB: 1945/09/09  Charlene Patterson is a 74 y.o. year old female who is a primary care patient of Janith Lima, MD. I reached out to Charlene Patterson by phone today in response to a referral sent by Ms. Leda Quail Mitro's PCP, Janith Lima, MD.   Ms. Top was given information about Chronic Care Management services today including:  1. CCM service includes personalized support from designated clinical staff supervised by her physician, including individualized plan of care and coordination with other care providers 2. 24/7 contact phone numbers for assistance for urgent and routine care needs. 3. Service will only be billed when office clinical staff spend 20 minutes or more in a month to coordinate care. 4. Only one practitioner may furnish and bill the service in a calendar month. 5. The patient may stop CCM services at any time (effective at the end of the month) by phone call to the office staff. 6. The patient will be responsible for cost sharing (co-pay) of up to 20% of the service fee (after annual deductible is met).  Patient agreed to services and verbal consent obtained.   Follow up plan:   Raynicia Dukes [employer] Scheduler

## 2019-08-27 DIAGNOSIS — R030 Elevated blood-pressure reading, without diagnosis of hypertension: Secondary | ICD-10-CM | POA: Diagnosis not present

## 2019-08-27 DIAGNOSIS — M5416 Radiculopathy, lumbar region: Secondary | ICD-10-CM | POA: Diagnosis not present

## 2019-08-27 DIAGNOSIS — M545 Low back pain: Secondary | ICD-10-CM | POA: Diagnosis not present

## 2019-09-01 ENCOUNTER — Other Ambulatory Visit: Payer: Self-pay

## 2019-09-01 ENCOUNTER — Ambulatory Visit: Payer: Medicare HMO | Admitting: Pharmacist

## 2019-09-01 DIAGNOSIS — F32A Depression, unspecified: Secondary | ICD-10-CM

## 2019-09-01 DIAGNOSIS — E785 Hyperlipidemia, unspecified: Secondary | ICD-10-CM

## 2019-09-01 DIAGNOSIS — F329 Major depressive disorder, single episode, unspecified: Secondary | ICD-10-CM

## 2019-09-01 DIAGNOSIS — E781 Pure hyperglyceridemia: Secondary | ICD-10-CM

## 2019-09-01 DIAGNOSIS — K219 Gastro-esophageal reflux disease without esophagitis: Secondary | ICD-10-CM

## 2019-09-01 NOTE — Chronic Care Management (AMB) (Signed)
Chronic Care Management Pharmacy  Name: Charlene Patterson  MRN: [id number] DOB: March 06, 1946   Chief Complaint/ HPI  Charlene Patterson,  74 y.o. , female presents for their Initial CCM visit with the clinical pharmacist via telephone due to COVID-19 Pandemic.  PCP : Janith Lima, MD  Their chronic conditions include: HLD, hypertriglyceridemia, GERD, osteoporosis, CKD stage 3, depression  From [facility], lived in this area since 10-05[place]65. Married in 1966/10/06. Built their own log house, fell off roof when building but was ok so didn't go to doctor. Husband died 10-06-10, sold house Oct 06, 2015. Practically raised grandson. Son with cerebral palsy. Living with 6 family members.   Office Visits: 08/14/19 Dr Ronnald Ramp OV: c/o neck/back pain. No NSAIDS d/t GI hx. Start tramadol prn. Pt also would like to pursue surgical options, referral sent. 02/05/19 Dr Ronnald Ramp OV: symptomatic IDA, start iron infusions.  Consult Visit: 03/01/19 ED visit: intractable headache, improved with Tylenol.  02/2019: received 2 iron infusions  Medications: Outpatient Encounter Medications as of 09/01/2019  Medication Sig  . acetaminophen (TYLENOL) 650 MG CR tablet Take 650 mg by mouth every 8 (eight) hours as needed for pain.  Marland Kitchen atorvastatin (LIPITOR) 40 MG tablet Take 1 tablet (40 mg total) by mouth daily.  . Calcium-Magnesium-Vitamin D (CALCIUM 1200+D3 PO) Take 2 capsules by mouth daily.  . citalopram (CELEXA) 10 MG tablet Take 1 tablet (10 mg total) by mouth daily.  . cyanocobalamin 2000 MCG tablet Take 1 tablet (2,000 mcg total) by mouth daily.  . Ergocalciferol (DRISDOL PO) Take 3,000 Units by mouth daily.  Marland Kitchen icosapent Ethyl (VASCEPA) 1 g capsule Take 2 capsules (2 g total) by mouth 2 (two) times daily.  . mirtazapine (REMERON) 7.5 MG tablet Take 1 tablet (7.5 mg total) by mouth at bedtime.  . pantoprazole (PROTONIX) 40 MG tablet Take 1 tablet (40 mg total) by mouth daily.   No facility-administered encounter medications on file as  of 09/01/2019.     Current Diagnosis/Assessment:  Goals Addressed            This Visit's Progress   . Pharmacy Care Plan       Current Barriers:  . Chronic Disease Management support, education, and care coordination needs related to HLD, Hypertriglyceridemia, and GERD, depression  Pharmacist Clinical Goal(s):  Marland Kitchen Maintain LDL < 100 . Improve triglycerides to < 150 . Ensure safety, efficacy, and affordability of medications . Improve pain control  Interventions: . Comprehensive medication review performed. . Discussed dietary changes to improve triglycerides (see below) . Discussed that Vascepa replaced fish oil to help with cholesterol and triglycerides . May take up to four (4) Tylenol Arthritis every day. Do not exceed 3000 mg of Tylenol per day. Remember Excedrin also has Tylenol (acetaminophen) in it.  Patient Self Care Activities:  . Self administers medications as prescribed, Calls pharmacy for medication refills, and Calls provider office for new concerns or questions  Initial goal documentation        Hyperlipidemia   Lipid Panel     Component Value Date/Time   CHOL 202 (H) 08/14/2019 1338   TRIG 288.0 (H) 08/14/2019 1338   HDL 42.20 08/14/2019 1338   CHOLHDL 5 08/14/2019 1338   VLDL 57.6 (H) 08/14/2019 1338   LDLDIRECT 99.0 08/14/2019 1338     ASCVD 10-year risk: 15.7%  Patient has failed these meds in past: Lovaza Patient is currently controlled on the following medications: atorvastatin 40 mg daily, Vascepa 2 g BID  We  discussed:  diet and exercise extensively, discussed types of foods high in triglycerides. Patient does enjoy sweets, tries to avoid fried foods and meats. Discussed benefits of Vascepa and atorvastation. Pt had stopped taking fish oil when she started Vascepa.  Plan  Continue current medications and control with diet and exercise   GERD   Patient has failed these meds in past: Dexilant, omeprazole, raniditine Patient is  currently controlled on the following medications: pantoprazole 40 mg daily  We discussed: when pt misses a dose of pantoprazole, GERD symptoms return.   Plan  Continue current medications   Depression   Patient has failed these meds in past: n/a Patient is currently controlled on the following medications: mirtazapine 7.5 mg HS, citalopram 10 mg daily  We discussed: pt reports meds are working well for her, denies issues with mood or anxiety.  Plan  Continue current medications    Back pain   Patient has failed these meds in past: n/a Patient is currently uncontrolled on the following medications:  Tylenol 650 mg - 2 tablets q AM, 1 tablet in afternoon. Pt has not picked up tramadol, she did not know it was at [facility].  We discussed: maximum daily Tylenol limits - pt may take up to 4 Tylenol Arthritis (650 mg tabs) per day without going over limit. She reports taking Excedrin occasionally when Tylenol wears up, counseled that this brand contains Tylenol as well.   Pt has seen ortho regarding back pain and is considering spinal injections.  Plan  Continue current medications   Health Maintenance   Patient is currently controlled on the following medications: Multivitamin womens health, calcium 600 mg BID,  Vitamin B12 , Psoriasis cream, shampoo  We discussed: pt has started using OTC Psoriasis shampoo and cream due to break out on her scalp, it has helped a lot.  Plan  Continue current medications   Medication Management   Pt uses [facility] for all medications Uses pill tray in a drawer - writes on bottles once or twice a day Pt endorses 95% compliance  We discussed: [employer] sends 4 meds every 3 months on same day. Pt orders next refill when she is about halfway through bottles. She used to forget to re-order meds and would have to go without for several days, but this is no longer an issue for her.  Plan  Continue current med management  strategy    Follow up: 3 month phone visit  Charlene Brooke, PharmD Clinical Pharmacist [facility] [phone]

## 2019-09-01 NOTE — Patient Instructions (Addendum)
Visit Information  Thank you for meeting with me to discuss your medications! I look forward to working with you to achieve your health care goals. Below is a summary of what we talked about during the visit:  Goals Addressed            This Visit's Progress   . Pharmacy Care Plan       Current Barriers:  . Chronic Disease Management support, education, and care coordination needs related to HLD, Hypertriglyceridemia, and GERD, depression  Pharmacist Clinical Goal(s):  Marland Kitchen Maintain LDL < 100 . Improve triglycerides to < 150 . Ensure safety, efficacy, and affordability of medications . Improve pain control  Interventions: . Comprehensive medication review performed. . Discussed dietary changes to improve triglycerides (see below) . Discussed that Vascepa replaced fish oil to help with cholesterol and triglycerides . May take up to four (4) Tylenol Arthritis every day. Do not exceed 3000 mg of Tylenol per day. Remember Excedrin also has Tylenol (acetaminophen) in it.  Patient Self Care Activities:  . Self administers medications as prescribed, Calls pharmacy for medication refills, and Calls provider office for new concerns or questions  Initial goal documentation       Charlene Patterson was given information about Chronic Care Management services today including:  1. CCM service includes personalized support from designated clinical staff supervised by her physician, including individualized plan of care and coordination with other care providers 2. 24/7 contact phone numbers for assistance for urgent and routine care needs. 3. Service will only be billed when office clinical staff spend 20 minutes or more in a month to coordinate care. 4. Only one practitioner may furnish and bill the service in a calendar month. 5. The patient may stop CCM services at any time (effective at the end of the month) by phone call to the office staff. 6. The patient will be responsible for cost sharing  (co-pay) of up to 20% of the service fee (after annual deductible is met).  Patient agreed to services and verbal consent obtained.   The patient verbalized understanding of instructions provided today and agreed to receive a mailed copy of patient instruction and/or educational materials. Telephone follow up appointment with pharmacy team member scheduled for: 12/03/19 @ 11:30am   Charlene Brooke, PharmD Clinical Pharmacist [facility] [phone]   High Triglycerides Eating Plan Triglycerides are a type of fat in the blood. High levels of triglycerides can increase your risk of heart disease and stroke. If your triglyceride levels are high, choosing the right foods can help lower your triglycerides and keep your heart healthy. Work with your health care provider or a diet and nutrition specialist (dietitian) to develop an eating plan that is right for you. What are tips for following this plan? General guidelines   Lose weight, if you are overweight. For most people, losing 5-10 lbs (2-5 kg) helps lower triglyceride levels. A weight-loss plan may include. ? 30 minutes of exercise at least 5 days a week. ? Reducing the amount of calories, sugar, and fat you eat.  Eat a wide variety of fresh fruits, vegetables, and whole grains. These foods are high in fiber.  Eat foods that contain healthy fats, such as fatty fish, nuts, seeds, and olive oil.  Avoid foods that are high in added sugar, added salt (sodium), saturated fat, and trans fat.  Avoid low-fiber, refined carbohydrates such as white bread, crackers, noodles, and white rice.  Avoid foods with partially hydrogenated oils (  trans fats), such as fried foods or stick margarine.  Limit alcohol intake to no more than 1 drink a day for nonpregnant women and 2 drinks a day for men. One drink equals 12 oz of beer, 5 oz of wine, or 1 oz of hard liquor. Your health care provider may recommend that you drink less  depending on your overall health. Reading food labels  Check food labels for the amount of saturated fat. Choose foods with no or very little saturated fat.  Check food labels for the amount of trans fat. Choose foods with no trans fat.  Check food labels for the amount of cholesterol. Choose foods low in cholesterol. Ask your dietitian how much cholesterol you should have each day.  Check food labels for the amount of sodium. Choose foods with less than 140 milligrams (mg) per serving. Shopping  Buy dairy products labeled as nonfat (skim) or low-fat (1%).  Avoid buying processed or prepackaged foods. These are often high in added sugar, sodium, and fat. Cooking  Choose healthy fats when cooking, such as olive oil or canola oil.  Cook foods using lower fat methods, such as baking, broiling, boiling, or grilling.  Make your own sauces, dressings, and marinades when possible, instead of buying them. Store-bought sauces, dressings, and marinades are often high in sodium and sugar. Meal planning  Eat more home-cooked food and less restaurant, buffet, and fast food.  Eat fatty fish at least 2 times each week. Examples of fatty fish include salmon, trout, mackerel, tuna, and herring.  If you eat whole eggs, do not eat more than 3 egg yolks per week. What foods are recommended? The items listed may not be a complete list. Talk with your dietitian about what dietary choices are best for you. Grains Whole wheat or whole grain breads, crackers, cereals, and pasta. Unsweetened oatmeal. Bulgur. Barley. Quinoa. Brown rice. Whole wheat flour tortillas. Vegetables Fresh or frozen vegetables. Low-sodium canned vegetables. Fruits All fresh, canned (in natural juice), or frozen fruits. Meats and other protein foods Skinless chicken or [place]. Ground chicken or [place]. Lean cuts of pork, trimmed of fat. Fish and seafood, especially salmon, trout, and herring. Egg whites. Dried beans, peas, or  lentils. Unsalted nuts or seeds. Unsalted canned beans. Natural peanut or almond butter. Dairy Low-fat dairy products. Skim or low-fat (1%) milk. Reduced fat (2%) and low-sodium cheese. Low-fat ricotta cheese. Low-fat cottage cheese. Plain, low-fat yogurt. Fats and oils Tub margarine without trans fats. Light or reduced-fat mayonnaise. Light or reduced-fat salad dressings. Avocado. Safflower, olive, sunflower, soybean, and canola oils. What foods are not recommended? The items listed may not be a complete list. Talk with your dietitian about what dietary choices are best for you. Grains White bread. White (regular) pasta. White rice. Cornbread. Bagels. Pastries. Crackers that contain trans fat. Vegetables Creamed or fried vegetables. Vegetables in a cheese sauce. Fruits Sweetened dried fruit. Canned fruit in syrup. Fruit juice. Meats and other protein foods Fatty cuts of meat. Ribs. Chicken wings. Berniece Salines. Sausage. Bologna. Salami. Chitterlings. Fatback. Hot dogs. Bratwurst. Packaged lunch meats. Dairy Whole or reduced-fat (2%) milk. Half-and-half. Cream cheese. Full-fat or sweetened yogurt. Full-fat cheese. Nondairy creamers. Whipped toppings. Processed cheese or cheese spreads. Cheese curds. Beverages Alcohol. Sweetened drinks, such as soda, lemonade, fruit drinks, or punches. Fats and oils Butter. Stick margarine. Lard. Shortening. Ghee. Bacon fat. Tropical oils, such as coconut, palm kernel, or palm oils. Sweets and desserts Corn syrup. Sugars. Honey. Molasses. Candy. Jam and jelly. Syrup.  Sweetened cereals. Cookies. Pies. Cakes. Donuts. Muffins. Ice cream. Condiments Store-bought sauces, dressings, and marinades that are high in sugar, such as ketchup and barbecue sauce. Summary  High levels of triglycerides can increase the risk of heart disease and stroke. Choosing the right foods can help lower your triglycerides.  Eat plenty of fresh fruits, vegetables, and whole grains. Choose  low-fat dairy and lean meats. Eat fatty fish at least twice a week.  Avoid processed and prepackaged foods with added sugar, sodium, saturated fat, and trans fat.  If you need suggestions or have questions about what types of food are good for you, talk with your health care provider or a dietitian. This information is not intended to replace advice given to you by your health care provider. Make sure you discuss any questions you have with your health care provider. Document Revised: 06/01/2017 Document Reviewed: 08/22/2016 Elsevier Patient Education  2020 [employer].

## 2019-09-02 NOTE — Progress Notes (Signed)
  I have reviewed this encounter including the documentation in this note and/or discussed this patient with the care management provider. I am certifying that I agree with the content of this note as supervising physician.  Scarlette Calico, MD  09/02/2019

## 2019-09-03 ENCOUNTER — Ambulatory Visit: Payer: Medicare HMO

## 2019-09-06 NOTE — Addendum Note (Signed)
Addended by: Karle Barr on: 09/06/2019 10:18 AM   Modules accepted: Orders

## 2019-09-29 DIAGNOSIS — M5416 Radiculopathy, lumbar region: Secondary | ICD-10-CM | POA: Diagnosis not present

## 2019-10-13 ENCOUNTER — Other Ambulatory Visit: Payer: Self-pay

## 2019-10-13 ENCOUNTER — Ambulatory Visit (INDEPENDENT_AMBULATORY_CARE_PROVIDER_SITE_OTHER): Payer: Medicare HMO

## 2019-10-13 VITALS — BP 128/60 | HR 75 | Temp 98.1°F | Resp 16 | Ht 64.0 in | Wt 140.0 lb

## 2019-10-13 DIAGNOSIS — Z0000 Encounter for general adult medical examination without abnormal findings: Secondary | ICD-10-CM | POA: Diagnosis not present

## 2019-10-13 NOTE — Progress Notes (Addendum)
Subjective:   Charlene Patterson is a 74 y.o. female who presents for Medicare Annual (Subsequent) preventive examination.  Review of Systems:  Medicare Wellness Visit Cardiac Risk Factors include: advanced age (>24men, >31 women);dyslipidemia  Sleep Patterns: No issues with falling sleep; feels rested on waking and sleeps between 8-9 hours nightly. Home Safety/Smoke Alarms: Feels safe in home; Smoke alarms in place. Living environment: 2-story home (lives on the first floor).  Lives with grandson and his family and son with cerebral palsy. Seat Belt Safety/Bike Helmet: Wears seat belt.     Objective:     Vitals: BP 128/60 (BP Location: Left Arm, Patient Position: Sitting, Cuff Size: Normal)   Pulse 75   Temp 98.1 F (36.7 C)   Resp 16   Ht 5\' 4"  (1.626 m)   Wt 140 lb (63.5 kg)   SpO2 98%   BMI 24.03 kg/m   Body mass index is 24.03 kg/m.  Advanced Directives 10/13/2019 09/02/2018 08/27/2017 12/22/2016 11/29/2016 11/16/2016 02/08/2016  Does Patient Have a Medical Advance Directive? Yes Yes No Yes No No Yes  Type of Paramedic of [place];Living will [place];Living will - [place];Living will - - [place];Living will  Does patient want to make changes to medical advance directive? - - - - - - No - Patient declined  Copy of [place] in Chart? No - copy requested No - copy requested - No - copy requested - - Yes  Would patient like information on creating a medical advance directive? - - Yes (ED - Information included in AVS) - - - -    Tobacco Social History   Tobacco Use  Smoking Status Never Smoker  Smokeless Tobacco Never Used     Counseling given: No   Clinical Intake:  Pre-visit preparation completed: Yes  Pain : No/denies pain Pain Score: 0-No pain     Nutritional Status: BMI of 19-24  Normal Nutritional Risks: None Diabetes: No  How often do you need to have  someone help you when you read instructions, pamphlets, or other written materials from your doctor or pharmacy?: 1 - Never What is the last grade level you completed in school?: 12th grade  Interpreter Needed?: No  Information entered by :: Alvine Mostafa N. Lowell Guitar, LPN  Past Medical History:  Diagnosis Date  . Allergy   . Anxiety   . Arthritis    "left foot" (05/26/2014)  . Chronic back pain   . Chronic neck pain   . Depression   . GERD (gastroesophageal reflux disease)   . History of chicken pox   . History of colon polyps   . History of hiatal hernia   . Lower GI bleeding   . Migraines    "monthly usually; weekly lately" (05/26/2014)  . Sciatica   . Thoracic compression fracture [facility])    Past Surgical History:  Procedure Laterality Date  . ABDOMINAL HYSTERECTOMY  ~ 1978   "partial"  . APPENDECTOMY  ~ 1978  . COLONOSCOPY  2005   last 2005  . POLYPECTOMY     Family History  Problem Relation Age of Onset  . Heart disease Mother   . Heart disease Father   . Prostate cancer Father   . Diabetes Sister   . Stomach cancer Sister 3  . Diabetes Brother   . Heart disease Brother   . Rectal cancer Neg Hx   . Colon cancer Neg Hx  Social History   Socioeconomic History  . Marital status: Widowed    Spouse name: Not on file  . Number of children: 2  . Years of education: 50  . Highest education level: Not on file  Occupational History  . Occupation: Retired  Tobacco Use  . Smoking status: Never Smoker  . Smokeless tobacco: Never Used  Substance and Sexual Activity  . Alcohol use: No    Alcohol/week: 0.0 standard drinks  . Drug use: No  . Sexual activity: Never  Other Topics Concern  . Not on file  Social History Narrative   Regular exercise-no   Caffeine Use-yes   Social Determinants of Health   Financial Resource Strain:   . Difficulty of Paying Living Expenses:   Food Insecurity:   . Worried About Charity fundraiser in the Last Year:   . Academic librarian in the Last Year:   Transportation Needs:   . Film/video editor (Medical):   Marland Kitchen Lack of Transportation (Non-Medical):   Physical Activity:   . Days of Exercise per Week:   . Minutes of Exercise per Session:   Stress:   . Feeling of Stress :   Social Connections:   . Frequency of Communication with Friends and Family:   . Frequency of Social Gatherings with Friends and Family:   . Attends Religious Services:   . Active Member of Clubs or Organizations:   . Attends Archivist Meetings:   Marland Kitchen Marital Status:     Outpatient Encounter Medications as of 10/13/2019  Medication Sig  . acetaminophen (TYLENOL) 650 MG CR tablet Take 650 mg by mouth every 8 (eight) hours as needed for pain.  Marland Kitchen atorvastatin (LIPITOR) 40 MG tablet Take 1 tablet (40 mg total) by mouth daily.  . Calcium-Magnesium-Vitamin D (CALCIUM 1200+D3 PO) Take 2 capsules by mouth daily.  . citalopram (CELEXA) 10 MG tablet Take 1 tablet (10 mg total) by mouth daily.  . cyanocobalamin 2000 MCG tablet Take 1 tablet (2,000 mcg total) by mouth daily.  . Ergocalciferol (DRISDOL PO) Take 3,000 Units by mouth daily.  Marland Kitchen icosapent Ethyl (VASCEPA) 1 g capsule Take 2 capsules (2 g total) by mouth 2 (two) times daily.  . mirtazapine (REMERON) 7.5 MG tablet Take 1 tablet (7.5 mg total) by mouth at bedtime.  . pantoprazole (PROTONIX) 40 MG tablet Take 1 tablet (40 mg total) by mouth daily.   No facility-administered encounter medications on file as of 10/13/2019.    Activities of Daily Living In your present state of health, do you have any difficulty performing the following activities: 10/13/2019  Hearing? Y  Comment wears hearing aids  Vision? Y  Comment right eye problems; has appt w/Dr. Katy Fitch  Difficulty concentrating or making decisions? Y  Comment at times  Walking or climbing stairs? N  Dressing or bathing? N  Doing errands, shopping? N  Preparing Food and eating ? N  Using the Toilet? N  In the past six  months, have you accidently leaked urine? N  Do you have problems with loss of bowel control? Y  Comment sometimes during the night  Managing your Medications? N  Managing your Finances? N  Housekeeping or managing your Housekeeping? N  Some recent data might be hidden    Patient Care Team: Janith Lima, MD as PCP - General (Internal Medicine) Charlton Haws, [facility] (Pharmacist)    Assessment:   This is a routine wellness examination for [place].  Exercise  Activities and Dietary recommendations Current Exercise Habits: The patient does not participate in regular exercise at present, Exercise limited by: orthopedic condition(s)(cervivcal & lumbar spinal stenosis)  Goals    . Client understands the importance of follow-up with providers by attending scheduled visits    . Pharmacy Care Plan     Current Barriers:  . Chronic Disease Management support, education, and care coordination needs related to HLD, Hypertriglyceridemia, and GERD, depression  Pharmacist Clinical Goal(s):  Marland Kitchen Maintain LDL < 100 . Improve triglycerides to < 150 . Ensure safety, efficacy, and affordability of medications . Improve pain control  Interventions: . Comprehensive medication review performed. . Discussed dietary changes to improve triglycerides (see below) . Discussed that Vascepa replaced fish oil to help with cholesterol and triglycerides . May take up to four (4) Tylenol Arthritis every day. Do not exceed 3000 mg of Tylenol per day. Remember Excedrin also has Tylenol (acetaminophen) in it.  Patient Self Care Activities:  . Self administers medications as prescribed, Calls pharmacy for medication refills, and Calls provider office for new concerns or questions  Initial goal documentation       Fall Risk Fall Risk  10/13/2019 02/05/2019 09/02/2018 08/27/2017 07/31/2017  Falls in the past year? 0 0 0 No No  Number falls in past yr: 0 0 - - -  Injury with Fall? 0 0 - - -  Risk for fall due to :  No Fall Risks Mental status change - - -  Follow up Falls evaluation completed;Education provided;Falls prevention discussed Falls evaluation completed;Education provided - - -   Is the patient's home free of loose throw rugs in walkways, pet beds, electrical cords, etc?   yes      Grab bars in the bathroom? yes      Handrails on the stairs?   yes      Adequate lighting?   yes  Depression Screen PHQ 2/9 Scores 10/13/2019 02/05/2019 09/02/2018 08/27/2017  PHQ - 2 Score 0 1 1 3   PHQ- 9 Score - 3 3 7      Cognitive Function MMSE - Mini Mental State Exam 09/02/2018 08/27/2017 04/17/2016 06/18/2015  Orientation to time 5 5 5 5   Orientation to Place 5 5 5 5   Registration 3 3 3 3   Attention/ Calculation 5 5 5 5   Recall 3 2 3 3   Language- name 2 objects 2 2 2 2   Language- repeat 1 1 1 1   Language- follow 3 step command 3 3 3 3   Language- read & follow direction 1 1 1 1   Write a sentence 1 1 1 1   Copy design 1 1 1 1   Total score 30 29 30 30      6CIT Screen 10/13/2019  What Year? 0 points  What month? 0 points  What time? 0 points  Count back from 20 0 points  Months in reverse 0 points  Repeat phrase 0 points  Total Score 0    Immunization History  Administered Date(s) Administered  . Fluad Quad(high Dose 65+) 08/14/2019  . Influenza, High Dose Seasonal PF 07/30/2017, 09/02/2018  . Influenza,inj,Quad PF,6+ Mos 04/14/2014, 05/11/2015  . Influenza-Unspecified 04/02/2012  . Pneumococcal Conjugate-13 05/11/2015  . Pneumococcal Polysaccharide-23 12/13/2012, 07/30/2017  . Tdap 12/13/2012    Qualifies for Shingles Vaccine? Received first dose from [facility]; will be getting second dose later in April 2021.  Screening Tests Health Maintenance  Topic Date Due  . INFLUENZA VACCINE  02/01/2020  . MAMMOGRAM  02/27/2021  . TETANUS/TDAP  12/14/2022  . COLONOSCOPY  08/13/2024  . DEXA SCAN  Completed  . Hepatitis C Screening  Completed  . PNA vac Low Risk Adult  Completed    Cancer  Screenings: Lung: Low Dose CT Chest recommended if Age 78-80 years, 30 pack-year currently smoking OR have quit w/in 15years. Patient does not qualify. Breast:  Up to date on Mammogram? Yes   Up to date of Bone Density/Dexa? Yes Colorectal: Yes      Plan:     Reviewed health maintenance screenings with patient today and relevant education, vaccines, and/or referrals were provided.    Continue doing brain stimulating activities (puzzles, reading, adult coloring books, staying active) to keep memory sharp.    Continue to eat heart healthy diet (full of fruits, vegetables, whole grains, lean protein, water--limit salt, fat, and sugar intake) and increase physical activity as tolerated.    I have personally reviewed and noted the following in the patient's chart:   . Medical and social history . Use of alcohol, tobacco or illicit drugs  . Current medications and supplements . Functional ability and status . Nutritional status . Physical activity . Advanced directives . List of other physicians . Hospitalizations, surgeries, and ER visits in previous 12 months . Vitals . Screenings to include cognitive, depression, and falls . Referrals and appointments  In addition, I have reviewed and discussed with patient certain preventive protocols, quality metrics, and best practice recommendations. A written personalized care plan for preventive services as well as general preventive health recommendations were provided to patient.     Sheral Flow, LPN  [id number]  Nurse Health Advisor, Embedded Care Coordination [place] [facility]  Site: [facility] [place]: [phone] Website: [email or web address]    Medical screening examination/treatment/procedure(s) were performed by non-physician practitioner and as supervising physician I was immediately available for consultation/collaboration. I agree with above. Scarlette Calico, MD

## 2019-10-13 NOTE — Patient Instructions (Addendum)
[name] , Thank you for taking time to come for your Medicare Wellness Visit. I appreciate your ongoing commitment to your health goals. Please review the following plan we discussed and let me know if I can assist you in the future.   Screening recommendations/referrals: Colorectal Screening: last done 08/13/2014 Mammogram: last done 02/28/2019 Bone Density: last done 08/03/2017  Vision and Dental Exams: Recommended annual ophthalmology exams for early detection of glaucoma and other disorders of the eye Recommended annual dental exams for proper oral hygiene  Vaccinations: Influenza vaccine: last done 08/14/2019  Pneumococcal vaccine: last done 05/11/2015 & 07/30/2017  Tdap vaccine: last done 12/13/2012; due every 10 years. Please call your insurance company to determine your out of pocket expense. You may also receive this vaccine at your local pharmacy or Health Dept.  Shingles vaccine: Please call your insurance company to determine your out of pocket expense for the Shingrix vaccine. You may receive this vaccine at your local pharmacy.  Advanced directives: Advance directives discussed with you today. Please bring a copy of your POA (Power of [place]) and/or Living Will to your next appointment.  Goals:  Recommend to drink at least 6-8 8oz glasses of water per day.  Recommend to exercise for at least 150 minutes per week.  Recommend to remove any items from the home that may cause slips or trips.  Recommend to decrease portion sizes by eating 3 small healthy meals and at least 2 healthy snacks per day.  Recommend to begin DASH diet as directed below  Recommend to continue efforts to reduce smoking habits until no longer smoking. Smoking Cessation literature is attached below.  Next appointment: Please schedule your Annual Wellness Visit with your Nurse Health Advisor in one year.  Preventive Care 37 Years and Older, Female Preventive care refers to lifestyle choices and  visits with your health care provider that can promote health and wellness. What does preventive care include?  A yearly physical exam. This is also called an annual well check.  Dental exams once or twice a year.  Routine eye exams. Ask your health care provider how often you should have your eyes checked.  Personal lifestyle choices, including:  Daily care of your teeth and gums.  Regular physical activity.  Eating a healthy diet.  Avoiding tobacco and drug use.  Limiting alcohol use.  Practicing safe sex.  Taking low-dose aspirin every day if recommended by your health care provider.  Taking vitamin and mineral supplements as recommended by your health care provider. What happens during an annual well check? The services and screenings done by your health care provider during your annual well check will depend on your age, overall health, lifestyle risk factors, and family history of disease. Counseling  Your health care provider may ask you questions about your:  Alcohol use.  Tobacco use.  Drug use.  Emotional well-being.  Home and relationship well-being.  Sexual activity.  Eating habits.  History of falls.  Memory and ability to understand (cognition).  Work and work Statistician.  Reproductive health. Screening  You may have the following tests or measurements:  Height, weight, and BMI.  Blood pressure.  Lipid and cholesterol levels. These may be checked every 5 years, or more frequently if you are over 68 years old.  Skin check.  Lung cancer screening. You may have this screening every year starting at age 26 if you have a 30-pack-year history of smoking and currently smoke or have quit within the past 15 years.  Fecal occult blood test (FOBT) of the stool. You may have this test every year starting at age 21.  Flexible sigmoidoscopy or colonoscopy. You may have a sigmoidoscopy every 5 years or a colonoscopy every 10 years starting at age  68.  Hepatitis C blood test.  Hepatitis B blood test.  Sexually transmitted disease (STD) testing.  Diabetes screening. This is done by checking your blood sugar (glucose) after you have not eaten for a while (fasting). You may have this done every 1-3 years.  Bone density scan. This is done to screen for osteoporosis. You may have this done starting at age 64.  Mammogram. This may be done every 1-2 years. Talk to your health care provider about how often you should have regular mammograms. Talk with your health care provider about your test results, treatment options, and if necessary, the need for more tests. Vaccines  Your health care provider may recommend certain vaccines, such as:  Influenza vaccine. This is recommended every year.  Tetanus, diphtheria, and acellular pertussis (Tdap, Td) vaccine. You may need a Td booster every 10 years.  Zoster vaccine. You may need this after age 69.  Pneumococcal 13-valent conjugate (PCV13) vaccine. One dose is recommended after age 3.  Pneumococcal polysaccharide (PPSV23) vaccine. One dose is recommended after age 52. Talk to your health care provider about which screenings and vaccines you need and how often you need them. This information is not intended to replace advice given to you by your health care provider. Make sure you discuss any questions you have with your health care provider. Document Released: 07/16/2015 Document Revised: 03/08/2016 Document Reviewed: 04/20/2015 Elsevier Interactive Patient Education  2017 [place] Prevention in the Home Falls can cause injuries. They can happen to people of all ages. There are many things you can do to make your home safe and to help prevent falls. What can I do on the outside of my home?  Regularly fix the edges of walkways and driveways and fix any cracks.  Remove anything that might make you trip as you walk through a door, such as a raised step or threshold.  Trim  any bushes or trees on the path to your home.  Use bright outdoor lighting.  Clear any walking paths of anything that might make someone trip, such as rocks or tools.  Regularly check to see if handrails are loose or broken. Make sure that both sides of any steps have handrails.  Any raised decks and porches should have guardrails on the edges.  Have any leaves, snow, or ice cleared regularly.  Use sand or salt on walking paths during winter.  Clean up any spills in your garage right away. This includes oil or grease spills. What can I do in the bathroom?  Use night lights.  Install grab bars by the toilet and in the tub and shower. Do not use towel bars as grab bars.  Use non-skid mats or decals in the tub or shower.  If you need to sit down in the shower, use a plastic, non-slip stool.  Keep the floor dry. Clean up any water that spills on the floor as soon as it happens.  Remove soap buildup in the tub or shower regularly.  Attach bath mats securely with double-sided non-slip rug tape.  Do not have throw rugs and other things on the floor that can make you trip. What can I do in the bedroom?  Use night lights.  Make sure that  you have a light by your bed that is easy to reach.  Do not use any sheets or blankets that are too big for your bed. They should not hang down onto the floor.  Have a firm chair that has side arms. You can use this for support while you get dressed.  Do not have throw rugs and other things on the floor that can make you trip. What can I do in the kitchen?  Clean up any spills right away.  Avoid walking on wet floors.  Keep items that you use a lot in easy-to-reach places.  If you need to reach something above you, use a strong step stool that has a grab bar.  Keep electrical cords out of the way.  Do not use floor polish or wax that makes floors slippery. If you must use wax, use non-skid floor wax.  Do not have throw rugs and other  things on the floor that can make you trip. What can I do with my stairs?  Do not leave any items on the stairs.  Make sure that there are handrails on both sides of the stairs and use them. Fix handrails that are broken or loose. Make sure that handrails are as long as the stairways.  Check any carpeting to make sure that it is firmly attached to the stairs. Fix any carpet that is loose or worn.  Avoid having throw rugs at the top or bottom of the stairs. If you do have throw rugs, attach them to the floor with carpet tape.  Make sure that you have a light switch at the top of the stairs and the bottom of the stairs. If you do not have them, ask someone to add them for you. What else can I do to help prevent falls?  Wear shoes that:  Do not have high heels.  Have rubber bottoms.  Are comfortable and fit you well.  Are closed at the toe. Do not wear sandals.  If you use a stepladder:  Make sure that it is fully opened. Do not climb a closed stepladder.  Make sure that both sides of the stepladder are locked into place.  Ask someone to hold it for you, if possible.  Clearly mark and make sure that you can see:  Any grab bars or handrails.  First and last steps.  Where the edge of each step is.  Use tools that help you move around (mobility aids) if they are needed. These include:  Canes.  Walkers.  Scooters.  Crutches.  Turn on the lights when you go into a dark area. Replace any light bulbs as soon as they burn out.  Set up your furniture so you have a clear path. Avoid moving your furniture around.  If any of your floors are uneven, fix them.  If there are any pets around you, be aware of where they are.  Review your medicines with your doctor. Some medicines can make you feel dizzy. This can increase your chance of falling. Ask your doctor what other things that you can do to help prevent falls. This information is not intended to replace advice given to  you by your health care provider. Make sure you discuss any questions you have with your health care provider. Document Released: 04/15/2009 Document Revised: 11/25/2015 Document Reviewed: 07/24/2014 Elsevier Interactive Patient Education  2017 [employer].

## 2019-10-29 DIAGNOSIS — M48061 Spinal stenosis, lumbar region without neurogenic claudication: Secondary | ICD-10-CM | POA: Diagnosis not present

## 2019-10-29 DIAGNOSIS — M5416 Radiculopathy, lumbar region: Secondary | ICD-10-CM | POA: Diagnosis not present

## 2019-11-18 ENCOUNTER — Other Ambulatory Visit: Payer: Self-pay | Admitting: Internal Medicine

## 2019-11-18 DIAGNOSIS — F450 Somatization disorder: Secondary | ICD-10-CM

## 2019-11-18 DIAGNOSIS — F32A Depression, unspecified: Secondary | ICD-10-CM

## 2019-12-03 ENCOUNTER — Ambulatory Visit: Payer: Medicare HMO | Admitting: Pharmacist

## 2019-12-03 ENCOUNTER — Other Ambulatory Visit: Payer: Self-pay

## 2019-12-03 DIAGNOSIS — E785 Hyperlipidemia, unspecified: Secondary | ICD-10-CM

## 2019-12-03 DIAGNOSIS — E781 Pure hyperglyceridemia: Secondary | ICD-10-CM

## 2019-12-03 NOTE — Patient Instructions (Addendum)
Visit Information  Goals Addressed            This Visit's Progress   . Pharmacy Care Plan       CARE PLAN ENTRY  Current Barriers:  . Chronic Disease Management support, education, and care coordination needs related to Hyperlipidemia and Osteoarthritis   Hyperlipidemia Lipid Panel     Component Value Date/Time   CHOL 202 (H) 08/14/2019 1338   TRIG 288.0 (H) 08/14/2019 1338   HDL 42.20 08/14/2019 1338   LDLDIRECT 99.0 08/14/2019 1338 .  Pharmacist Clinical Goal(s): o Over the next 90 days, patient will work with PharmD and providers to maintain LDL goal < 100 and TRIG < 150 . Current regimen:  o atorvastatin 40 mg daily,  o Vascepa 1 gram - 2 capsules twice a day . Interventions: o Applied for copay assistance for Vascepa through [employer] o Discussed benefits of Vascepa including lowering triglyceridies and cardiovascular risk reduction . Patient self care activities - Over the next 90 days, patient will: o Continue medications as prescribed o Contact pharmacist with copay issues  Osteoarthritis . Pharmacist Clinical Goal(s) o Over the next 90 days, patient will work with PharmD and providers to optimize arthritis medications . Current regimen:  o Tylenol Arthritis 650 mg - up to 2 tablets twice a day . Interventions: o Discussed maximum dose of Tylenol is 3000 mg per day o Recommended Voltaren gel OTC to apply to joints . Patient self care activities - Over the next 90 days, patient will: o Continue medications as above o Try Voltaren gel for arthritis pain  Medication management . Pharmacist Clinical Goal(s): o Over the next 90 days, patient will work with PharmD and providers to maintain optimal medication adherence . Current pharmacy: [facility] mail order . Interventions o Comprehensive medication review performed. o Continue current medication management strategy . Patient self care activities - Over the next 90 days, patient will: o Focus on  medication adherence by patient report o Take medications as prescribed o Report any questions or concerns to PharmD and/or provider(s)  Please see past updates related to this goal by clicking on the "Past Updates" button in the selected goal       The patient verbalized understanding of instructions provided today and agreed to receive a mailed copy of patient instruction and/or educational materials.  Telephone follow up appointment with pharmacy team member scheduled for: 3 months  Charlene Brooke, PharmD Clinical Pharmacist [facility] [phone]    Osteoarthritis  Osteoarthritis is a type of arthritis that affects tissue that covers the ends of bones in joints (cartilage). Cartilage acts as a cushion between the bones and helps them move smoothly. Osteoarthritis results when cartilage in the joints gets worn down. Osteoarthritis is sometimes called "wear and tear" arthritis. Osteoarthritis is the most common form of arthritis. It often occurs in older people. It is a condition that gets worse over time (a progressive condition). Joints that are most often affected by this condition are in:  Fingers.  Toes.  Hips.  Knees.  Spine, including neck and lower back. What are the causes? This condition is caused by age-related wearing down of cartilage that covers the ends of bones. What increases the risk? The following factors may make you more likely to develop this condition:  Older age.  Being overweight or obese.  Overuse of joints, such as in athletes.  Past injury of a joint.  Past surgery on a joint.  Family  history of osteoarthritis. What are the signs or symptoms? The main symptoms of this condition are pain, swelling, and stiffness in the joint. The joint may lose its shape over time. Small pieces of bone or cartilage may break off and float inside of the joint, which may cause more pain and damage to the joint. Small deposits  of bone (osteophytes) may grow on the edges of the joint. Other symptoms may include:  A grating or scraping feeling inside the joint when you move it.  Popping or creaking sounds when you move. Symptoms may affect one or more joints. Osteoarthritis in a major joint, such as your knee or hip, can make it painful to walk or exercise. If you have osteoarthritis in your hands, you might not be able to grip items, twist your hand, or control small movements of your hands and fingers (fine motor skills). How is this diagnosed? This condition may be diagnosed based on:  Your medical history.  A physical exam.  Your symptoms.  X-rays of the affected joint(s).  Blood tests to rule out other types of arthritis. How is this treated? There is no cure for this condition, but treatment can help to control pain and improve joint function. Treatment plans may include:  A prescribed exercise program that allows for rest and joint relief. You may work with a physical therapist.  A weight control plan.  Pain relief techniques, such as: ? Applying heat and cold to the joint. ? Electric pulses delivered to nerve endings under the skin (transcutaneous electrical nerve stimulation, or TENS). ? Massage. ? Certain nutritional supplements.  NSAIDs or prescription medicines to help relieve pain.  Medicine to help relieve pain and inflammation (corticosteroids). This can be given by mouth (orally) or as an injection.  Assistive devices, such as a brace, wrap, splint, specialized glove, or cane.  Surgery, such as: ? An osteotomy. This is done to reposition the bones and relieve pain or to remove loose pieces of bone and cartilage. ? Joint replacement surgery. You may need this surgery if you have very bad (advanced) osteoarthritis. Follow these instructions at home: Activity  Rest your affected joints as directed by your health care provider.  Do not drive or use heavy machinery while taking  prescription pain medicine.  Exercise as directed. Your health care provider or physical therapist may recommend specific types of exercise, such as: ? Strengthening exercises. These are done to strengthen the muscles that support joints that are affected by arthritis. They can be performed with weights or with exercise bands to add resistance. ? Aerobic activities. These are exercises, such as brisk walking or water aerobics, that get your heart pumping. ? Range-of-motion activities. These keep your joints easy to move. ? Balance and agility exercises. Managing pain, stiffness, and swelling      If directed, apply heat to the affected area as often as told by your health care provider. Use the heat source that your health care provider recommends, such as a moist heat pack or a heating pad. ? If you have a removable assistive device, remove it as told by your health care provider. ? Place a towel between your skin and the heat source. If your health care provider tells you to keep the assistive device on while you apply heat, place a towel between the assistive device and the heat source. ? Leave the heat on for 20-30 minutes. ? Remove the heat if your skin turns bright red. This is especially important if  you are unable to feel pain, heat, or cold. You may have a greater risk of getting burned.  If directed, put ice on the affected joint: ? If you have a removable assistive device, remove it as told by your health care provider. ? Put ice in a plastic bag. ? Place a towel between your skin and the bag. If your health care provider tells you to keep the assistive device on during icing, place a towel between the assistive device and the bag. ? Leave the ice on for 20 minutes, 2-3 times a day. General instructions  Take over-the-counter and prescription medicines only as told by your health care provider.  Maintain a healthy weight. Follow instructions from your health care provider for  weight control. These may include dietary restrictions.  Do not use any products that contain nicotine or tobacco, such as cigarettes and e-cigarettes. These can delay bone healing. If you need help quitting, ask your health care provider.  Use assistive devices as directed by your health care provider.  Keep all follow-up visits as told by your health care provider. This is important. Where to find more information  [employer] of Arthritis and Musculoskeletal and Skin Diseases: www.niams.SouthExposed.es  [employer] on Aging: http://kim-miller.com/  American College of Rheumatology: www.rheumatology.org Contact a health care provider if:  Your skin turns red.  You develop a rash.  You have pain that gets worse.  You have a fever along with joint or muscle aches. Get help right away if:  You lose a lot of weight.  You suddenly lose your appetite.  You have night sweats. Summary  Osteoarthritis is a type of arthritis that affects tissue covering the ends of bones in joints (cartilage).  This condition is caused by age-related wearing down of cartilage that covers the ends of bones.  The main symptom of this condition is pain, swelling, and stiffness in the joint.  There is no cure for this condition, but treatment can help to control pain and improve joint function. This information is not intended to replace advice given to you by your health care provider. Make sure you discuss any questions you have with your health care provider. Document Revised: 06/01/2017 Document Reviewed: 02/21/2016 Elsevier Patient Education  2020 [employer].

## 2019-12-03 NOTE — Chronic Care Management (AMB) (Signed)
Chronic Care Management Pharmacy  Name: Charlene Patterson  MRN: [id number] DOB: 03/18/1946   Chief Complaint/ HPI  Charlene Patterson,  74 y.o. , female presents for their Follow-Up CCM visit with the clinical pharmacist In office.  PCP : Janith Lima, MD  Their chronic conditions include: HLD, hypertriglyceridemia, GERD, osteoporosis, CKD stage 3, depression  From [facility] [place] [facility] [place], lived in this area since 1965/[place]04. Married in Oct 05, 1966. Built their own log house, fell off roof when building but was ok so didn't go to doctor. Husband died 10/05/2010, sold house 10-05-15. Practically raised grandson. Son with cerebral palsy. Living with 6 family members.   Office Visits: 08/14/19 Dr Ronnald Ramp OV: c/o neck/back pain. No NSAIDS d/t GI hx. Start tramadol prn. Pt also would like to pursue surgical options, referral sent. 02/05/19 Dr Ronnald Ramp OV: symptomatic IDA, start iron infusions.  Consult Visit: 03/01/19 ED visit: intractable headache, improved with Tylenol.  02/2019: received 2 iron infusions  Medications: Outpatient Encounter Medications as of 12/03/2019  Medication Sig  . acetaminophen (TYLENOL) 650 MG CR tablet Take 650 mg by mouth every 8 (eight) hours as needed for pain.  Marland Kitchen aspirin-acetaminophen-caffeine (EXCEDRIN MIGRAINE) 250-250-65 MG tablet Take 1-2 tablets by mouth every 6 (six) hours as needed for headache.  Marland Kitchen atorvastatin (LIPITOR) 40 MG tablet Take 1 tablet (40 mg total) by mouth daily.  . Calcium-Magnesium-Vitamin D (CALCIUM 1200+D3 PO) Take 2 capsules by mouth daily.  . citalopram (CELEXA) 10 MG tablet TAKE 1 TABLET BY MOUTH DAILY.  . cyanocobalamin 2000 MCG tablet Take 1 tablet (2,000 mcg total) by mouth daily.  . Ergocalciferol (DRISDOL PO) Take 3,000 Units by mouth daily.  Marland Kitchen icosapent Ethyl (VASCEPA) 1 g capsule Take 2 capsules (2 g total) by mouth 2 (two) times daily.  . mirtazapine (REMERON) 7.5 MG tablet TAKE 1 TABLET AT BEDTIME  . pantoprazole (PROTONIX) 40 MG tablet Take 1 tablet (40 mg  total) by mouth daily.  Marland Kitchen Phenylephrine-Aspirin (ALKA-SELTZER PLUS SINUS PO) Take by mouth.   No facility-administered encounter medications on file as of 12/03/2019.    Current Diagnosis/Assessment:  SDOH Interventions     Most Recent Value  SDOH Interventions  Financial Strain Interventions  -- [[employer] funding for Vascepa approved]     Goals Addressed            This Visit's Progress   . Pharmacy Care Plan       CARE PLAN ENTRY  Current Barriers:  . Chronic Disease Management support, education, and care coordination needs related to Hyperlipidemia and Osteoarthritis   Hyperlipidemia Lipid Panel     Component Value Date/Time   CHOL 202 (H) 08/14/2019 1338   TRIG 288.0 (H) 08/14/2019 1338   HDL 42.20 08/14/2019 1338   LDLDIRECT 99.0 08/14/2019 1338 .  Pharmacist Clinical Goal(s): o Over the next 90 days, patient will work with PharmD and providers to maintain LDL goal < 100 and TRIG < 150 . Current regimen:  o atorvastatin 40 mg daily,  o Vascepa 1 gram - 2 capsules twice a day . Interventions: o Applied for copay assistance for Vascepa through [employer] o Discussed benefits of Vascepa including lowering triglyceridies and cardiovascular risk reduction . Patient self care activities - Over the next 90 days, patient will: o Continue medications as prescribed o Contact pharmacist with copay issues  Osteoarthritis . Pharmacist Clinical Goal(s) o Over the next 90 days, patient will work with PharmD and providers to optimize arthritis medications . Current regimen:  o Tylenol Arthritis 650 mg - up to 2 tablets twice a day . Interventions: o Discussed maximum dose of Tylenol is 3000 mg per day o Recommended Voltaren gel OTC to apply to joints . Patient self care activities - Over the next 90 days, patient will: o Continue medications as above o Try Voltaren gel for arthritis pain  Medication management . Pharmacist Clinical Goal(s): o Over the next  90 days, patient will work with PharmD and providers to maintain optimal medication adherence . Current pharmacy: [facility] mail order . Interventions o Comprehensive medication review performed. o Continue current medication management strategy . Patient self care activities - Over the next 90 days, patient will: o Focus on medication adherence by patient report o Take medications as prescribed o Report any questions or concerns to PharmD and/or provider(s)  Please see past updates related to this goal by clicking on the "Past Updates" button in the selected goal         Hyperlipidemia   Lipid Panel     Component Value Date/Time   CHOL 202 (H) 08/14/2019 1338   TRIG 288.0 (H) 08/14/2019 1338   HDL 42.20 08/14/2019 1338   CHOLHDL 5 08/14/2019 1338   VLDL 57.6 (H) 08/14/2019 1338   LDLDIRECT 99.0 08/14/2019 1338    The 10-year ASCVD risk score Mikey Bussing DC Jr., et al., 2013) is: 13.3%   Values used to calculate the score:     Age: 67 years     Sex: Female     Is Non-Hispanic African American: No     Diabetic: No     Tobacco smoker: No     Systolic Blood Pressure: 0000000 mmHg     Is BP treated: No     HDL Cholesterol: 42.2 mg/dL     Total Cholesterol: 202 mg/dL  Patient has failed these meds in past: Lovaza Patient is currently controlled on the foTllowing medications:   atorvastatin 40 mg daily,   Vascepa 2 g BID  We discussed: benefits of Vascepa for Trig and ASCVD risk reduction; copay for Vascepa is $125 for 90 days - applied for [employer] and conditionally approved. [place] with [place] copay information.  Plan  Continue current medications and control with diet and exercise  Vascepa copay via [place]   GERD   Patient has failed these meds in past: Dexilant, omeprazole, raniditine Patient is currently controlled on the following medications:   pantoprazole 40 mg daily  We discussed: when pt misses a dose of pantoprazole,  GERD symptoms return.   Plan  Continue current medications   Depression   Depression screen [facility] 2/9 10/13/2019 02/05/2019 09/02/2018  Decreased Interest 0 0 0  Down, Depressed, Hopeless 0 1 1  PHQ - 2 Score 0 1 1  Altered sleeping - 1 1  Tired, decreased energy - 0 0  Change in appetite - 1 1  Feeling bad or failure about yourself  - 0 0  Trouble concentrating - 0 0  Moving slowly or fidgety/restless - 0 0  Suicidal thoughts - 0 0  PHQ-9 Score - 3 3  Difficult doing work/chores - Not difficult at all Not difficult at all   Patient has failed these meds in past: n/a Patient is currently controlled on the following medications:   mirtazapine 7.5 mg HS,   citalopram 10 mg daily  We discussed: pt reports meds are working well for her, denies issues with mood or anxiety.  Plan  Continue current medications  Osteoarthritis   Patient has failed these meds in past: n/a Patient is currently uncontrolled on the following medications:    Tylenol 650 mg - 2 tablets q AM, 1 tablet in afternoon.   We discussed: maximum daily Tylenol limits 3g/day; recommended Voltaren gel for topical relief; discussed pathophysiology of OA and Tylenol as first line treatment.  Plan  Continue current medications  Recommend OTC Voltaren gel for topical relief  Migraine/Headache   Patient has failed these meds in past: n/a Patient is currently controlled on the following medications:  . Excedrin extra strength . Excedrin migraine . Alka seltzer + sinus  We discussed:  Pt reports she has had headaches/migraines all her life; she reports about 1 migraine per month which can usually be controlled with Excedrin; also reports sinus headaches that she control with Alka seltzer + sinus. She reports she has never taken Rx migraine medications. Discussed available options including triptans and preventative therapy like propranolol; pt is ok controlling her migraines with OTC products right  now.  Plan  Continue current medications   Health Maintenance   Patient is currently controlled on the following medications:   Multivitamin womens health,   calcium 600 mg BID,    Vitamin B12 ,  Psoriasis cream, shampoo  We discussed: pt has started using OTC Psoriasis shampoo and cream due to break out on her scalp, it has helped a lot.  Plan  Continue current medications   Medication Management   Pt uses [facility] for all medications Uses pill tray in a drawer - writes on bottles once or twice a day Pt endorses 95% compliance  We discussed: [employer] sends 4 meds every 3 months on same day. Pt orders next refill when she is about halfway through bottles. She used to forget to re-order meds and would have to go without for several days, but this is no longer an issue for her.  Plan  Continue current med management strategy    Follow up: 3 month phone visit  Charlene Brooke, PharmD Clinical Pharmacist [facility] at [facility] [phone]

## 2020-02-11 ENCOUNTER — Other Ambulatory Visit: Payer: Self-pay | Admitting: Internal Medicine

## 2020-02-11 DIAGNOSIS — Z1231 Encounter for screening mammogram for malignant neoplasm of breast: Secondary | ICD-10-CM

## 2020-02-16 ENCOUNTER — Other Ambulatory Visit: Payer: Self-pay | Admitting: Internal Medicine

## 2020-02-16 DIAGNOSIS — E785 Hyperlipidemia, unspecified: Secondary | ICD-10-CM

## 2020-03-01 ENCOUNTER — Ambulatory Visit
Admission: RE | Admit: 2020-03-01 | Discharge: 2020-03-01 | Disposition: A | Discharge status: Home / Self Care | Payer: Medicare HMO | Source: Ambulatory Visit | Attending: Internal Medicine | Admitting: Internal Medicine

## 2020-03-01 ENCOUNTER — Other Ambulatory Visit: Payer: Self-pay

## 2020-03-01 DIAGNOSIS — Z1231 Encounter for screening mammogram for malignant neoplasm of breast: Secondary | ICD-10-CM

## 2020-03-02 LAB — HM MAMMOGRAPHY

## 2020-03-04 ENCOUNTER — Telehealth: Payer: Medicare HMO

## 2020-03-04 NOTE — Chronic Care Management (AMB) (Deleted)
Chronic Care Management Pharmacy  Name: Charlene Patterson  MRN: [id number] DOB: [birth year]   Chief Complaint/ HPI  Charlene Patterson,  74 y.o. , female presents for their Follow-Up CCM visit with the clinical pharmacist via telephone.  PCP : Janith Lima, MD  Their chronic conditions include: HLD, hypertriglyceridemia, GERD, osteoporosis, CKD stage 3, depression  From [facility], lived in this area since 18-Oct-1965. Married in 1966/10/19. Built their own log house, fell off roof when building but was ok so didn't go to doctor. Husband died 10/19/2010, sold house October 19, 2015. Practically raised grandson. Son with cerebral palsy. Living with 6 family members.   Office Visits: 08/14/19 Dr Ronnald Ramp OV: c/o neck/back pain. No NSAIDS d/t GI hx. Start tramadol prn. Pt also would like to pursue surgical options, referral sent. 02/05/19 Dr Ronnald Ramp OV: symptomatic IDA, start iron infusions.  Consult Visit: 03/01/19 ED visit: intractable headache, improved with Tylenol.  02/2019: received 2 iron infusions  Medications: Outpatient Encounter Medications as of 03/04/2020  Medication Sig  . acetaminophen (TYLENOL) 650 MG CR tablet Take 650 mg by mouth every 8 (eight) hours as needed for pain.  Marland Kitchen aspirin-acetaminophen-caffeine (EXCEDRIN MIGRAINE) 250-250-65 MG tablet Take 1-2 tablets by mouth every 6 (six) hours as needed for headache.  Marland Kitchen atorvastatin (LIPITOR) 40 MG tablet TAKE 1 TABLET BY MOUTH DAILY.  . Calcium-Magnesium-Vitamin D (CALCIUM 1200+D3 PO) Take 2 capsules by mouth daily.  . citalopram (CELEXA) 10 MG tablet TAKE 1 TABLET BY MOUTH DAILY.  . cyanocobalamin 2000 MCG tablet Take 1 tablet (2,000 mcg total) by mouth daily.  . Ergocalciferol (DRISDOL PO) Take 3,000 Units by mouth daily.  Marland Kitchen icosapent Ethyl (VASCEPA) 1 g capsule Take 2 capsules (2 g total) by mouth 2 (two) times daily.  . mirtazapine (REMERON) 7.5 MG tablet TAKE 1 TABLET AT BEDTIME  . pantoprazole (PROTONIX) 40 MG tablet Take 1 tablet (40 mg total) by mouth  daily.  Marland Kitchen Phenylephrine-Aspirin (ALKA-SELTZER PLUS SINUS PO) Take by mouth.   No facility-administered encounter medications on file as of 03/04/2020.    Current Diagnosis/Assessment:   Goals Addressed   None      Hyperlipidemia   Lipid Panel     Component Value Date/Time   CHOL 202 (H) 08/14/2019 1338   TRIG 288.0 (H) 08/14/2019 1338   HDL 42.20 08/14/2019 1338   CHOLHDL 5 08/14/2019 1338   VLDL 57.6 (H) 08/14/2019 1338   LDLDIRECT 99.0 08/14/2019 1338    The 10-year ASCVD risk score Mikey Bussing DC Jr., et al., 19-Oct-2011) is: 13.3%   Values used to calculate the score:     Age: 64 years     Sex: Female     Is Non-Hispanic African American: No     Diabetic: No     Tobacco smoker: No     Systolic Blood Pressure: 300 mmHg     Is BP treated: No     HDL Cholesterol: 42.2 mg/dL     Total Cholesterol: 202 mg/dL  Patient has failed these meds in past: Lovaza Patient is currently controlled on the foTllowing medications:   atorvastatin 40 mg daily,   Vascepa 2 g BID  We discussed: benefits of Vascepa for Trig and ASCVD risk reduction; copay for Vascepa is $125 for 90 days - applied for [employer] and conditionally approved. [place] with [place] copay information.  Plan  Continue current medications and control with diet and exercise  Vascepa copay via [place]   GERD  Patient has failed these meds in past: Dexilant, omeprazole, raniditine Patient is currently controlled on the following medications:   pantoprazole 40 mg daily  We discussed: when pt misses a dose of pantoprazole, GERD symptoms return.   Plan  Continue current medications   Depression   Depression screen [facility] 2/9 10/13/2019 02/05/2019 09/02/2018  Decreased Interest 0 0 0  Down, Depressed, Hopeless 0 1 1  PHQ - 2 Score 0 1 1  Altered sleeping - 1 1  Tired, decreased energy - 0 0  Change in appetite - 1 1  Feeling bad or failure about yourself  - 0 0  Trouble  concentrating - 0 0  Moving slowly or fidgety/restless - 0 0  Suicidal thoughts - 0 0  PHQ-9 Score - 3 3  Difficult doing work/chores - Not difficult at all Not difficult at all   Patient has failed these meds in past: n/a Patient is currently controlled on the following medications:   mirtazapine 7.5 mg HS,   citalopram 10 mg daily  We discussed: pt reports meds are working well for her, denies issues with mood or anxiety.  Plan  Continue current medications    Osteoarthritis   Patient has failed these meds in past: n/a Patient is currently uncontrolled on the following medications:    Tylenol 650 mg - 2 tablets q AM, 1 tablet in afternoon.   We discussed: maximum daily Tylenol limits 3g/day; recommended Voltaren gel for topical relief; discussed pathophysiology of OA and Tylenol as first line treatment.  Plan  Continue current medications  Recommend OTC Voltaren gel for topical relief  Migraine/Headache   Patient has failed these meds in past: n/a Patient is currently controlled on the following medications:  . Excedrin extra strength . Excedrin migraine . Alka seltzer + sinus  We discussed:  Pt reports she has had headaches/migraines all her life; she reports about 1 migraine per month which can usually be controlled with Excedrin; also reports sinus headaches that she control with Alka seltzer + sinus. She reports she has never taken Rx migraine medications. Discussed available options including triptans and preventative therapy like propranolol; pt is ok controlling her migraines with OTC products right now.  Plan  Continue current medications   Health Maintenance   Patient is currently controlled on the following medications:   Multivitamin womens health,   calcium 600 mg BID,    Vitamin B12 ,  Psoriasis cream, shampoo  We discussed: pt has started using OTC Psoriasis shampoo and cream due to break out on her scalp, it has helped a  lot.  Plan  Continue current medications   Medication Management   Pt uses [facility] for all medications Uses pill tray in a drawer - writes on bottles once or twice a day Pt endorses 95% compliance  We discussed: [employer] sends 4 meds every 3 months on same day. Pt orders next refill when she is about halfway through bottles. She used to forget to re-order meds and would have to go without for several days, but this is no longer an issue for her.  Plan  Continue current med management strategy    Follow up: *** month phone visit  Charlene Brooke, PharmD, BCACP Clinical Pharmacist [facility] at [facility] [phone]

## 2020-03-17 ENCOUNTER — Other Ambulatory Visit: Payer: Self-pay | Admitting: Internal Medicine

## 2020-03-17 DIAGNOSIS — F450 Somatization disorder: Secondary | ICD-10-CM

## 2020-03-17 DIAGNOSIS — F32A Depression, unspecified: Secondary | ICD-10-CM

## 2020-03-18 ENCOUNTER — Other Ambulatory Visit: Payer: Self-pay | Admitting: Internal Medicine

## 2020-03-18 DIAGNOSIS — E781 Pure hyperglyceridemia: Secondary | ICD-10-CM

## 2020-03-18 DIAGNOSIS — F450 Somatization disorder: Secondary | ICD-10-CM

## 2020-03-18 DIAGNOSIS — M5416 Radiculopathy, lumbar region: Secondary | ICD-10-CM | POA: Diagnosis not present

## 2020-03-18 DIAGNOSIS — F32A Depression, unspecified: Secondary | ICD-10-CM

## 2020-03-18 DIAGNOSIS — K219 Gastro-esophageal reflux disease without esophagitis: Secondary | ICD-10-CM

## 2020-04-13 ENCOUNTER — Telehealth: Payer: Self-pay | Admitting: Pharmacist

## 2020-04-13 DIAGNOSIS — E119 Type 2 diabetes mellitus without complications: Secondary | ICD-10-CM | POA: Diagnosis not present

## 2020-04-13 NOTE — Progress Notes (Addendum)
    Chronic Care Management Pharmacy Assistant   Name: Charlene Patterson  MRN: [id number] DOB: [birth year]  Reason for Encounter: General Adherence Call    PCP : Janith Lima, MD  Allergies:  No Known Allergies  Medications: Outpatient Encounter Medications as of 04/13/2020  Medication Sig  . acetaminophen (TYLENOL) 650 MG CR tablet Take 650 mg by mouth every 8 (eight) hours as needed for pain.  Marland Kitchen aspirin-acetaminophen-caffeine (EXCEDRIN MIGRAINE) 250-250-65 MG tablet Take 1-2 tablets by mouth every 6 (six) hours as needed for headache.  Marland Kitchen atorvastatin (LIPITOR) 40 MG tablet TAKE 1 TABLET BY MOUTH DAILY.  . Calcium-Magnesium-Vitamin D (CALCIUM 1200+D3 PO) Take 2 capsules by mouth daily.  . citalopram (CELEXA) 10 MG tablet TAKE 1 TABLET EVERY DAY  . cyanocobalamin 2000 MCG tablet Take 1 tablet (2,000 mcg total) by mouth daily.  . Ergocalciferol (DRISDOL PO) Take 3,000 Units by mouth daily.  . mirtazapine (REMERON) 7.5 MG tablet TAKE 1 TABLET AT BEDTIME  . pantoprazole (PROTONIX) 40 MG tablet TAKE 1 TABLET BY MOUTH DAILY.  Marland Kitchen Phenylephrine-Aspirin (ALKA-SELTZER PLUS SINUS PO) Take by mouth.  Marland Kitchen VASCEPA 1 g capsule TAKE 2 CAPSULES TWICE DAILY   No facility-administered encounter medications on file as of 04/13/2020.    Current Diagnosis: Patient Active Problem List   Diagnosis Date Noted  . Spinal stenosis in cervical region 08/14/2019  . Chronic renal disease, stage 3, moderately decreased glomerular filtration rate (GFR) between 30-59 mL/min/1.73 square meter ([place]) 02/05/2019  . Other dietary vitamin B12 deficiency anemia 02/05/2019  . Iron deficiency anemia due to chronic blood loss 07/30/2017  . Hyperglycemia 07/30/2017  . Episode of recurrent major depressive disorder ([place]) 01/05/2017  . Routine general medical examination at a health care facility 02/08/2016  . Hypertriglyceridemia 05/11/2015  . Hyperlipidemia with target LDL less than 130 10/29/2014  . Frequent PVCs  10/29/2014  . Vitamin D deficiency 09/24/2014  . Colitis 05/25/2014  . Allergic rhinitis 05/25/2014  . Spinal stenosis of lumbar region at multiple levels 05/25/2014  . Thoracic compression fracture ([place]) 05/12/2014  . Visit for screening mammogram 05/10/2014  . GERD (gastroesophageal reflux disease) 12/13/2012  . Osteoporosis 12/13/2012  . Depression with somatization 12/13/2012    Goals Addressed   None     Follow-Up:  Pharmacist Review   Called to discuss general adherence with the patient. The patient had her follow up visit with Clinical pharmacist Mendel Ryder on 12/03/2019. During that visit they discussed the patient's on going headaches and migraines she has been taking OTC medication Excedrin extra strength and Excedrin migraine. But stated that the medication is not as effective as they used to be. I discussed with her based on pharmacist's notes about possibly going on RX strength medication. The patient voiced her concerns about taking to much prescribed medication. So I suggested that she schedule a follow up visit with Mendel Ryder. The patient agreed she is scheduled for 04/20/20 @330 .   It was also discussed with the patient about possibly using Voltaren Gel per clinical pharmacist notes from last visit she would like to also have a conversation with Mendel Ryder about the use of the gel and its benefits.   Rosendo Gros, [facility]  Practice Team Manager/ CPA (Clinical Pharmacist Assistant) [phone]

## 2020-04-20 ENCOUNTER — Ambulatory Visit: Payer: Medicare HMO | Admitting: Pharmacist

## 2020-04-20 ENCOUNTER — Other Ambulatory Visit: Payer: Self-pay

## 2020-04-20 DIAGNOSIS — M48061 Spinal stenosis, lumbar region without neurogenic claudication: Secondary | ICD-10-CM

## 2020-04-20 DIAGNOSIS — G43909 Migraine, unspecified, not intractable, without status migrainosus: Secondary | ICD-10-CM

## 2020-04-20 NOTE — Chronic Care Management (AMB) (Signed)
Chronic Care Management Pharmacy  Name: Charlene Patterson  MRN: [id number] DOB: 1946/03/07   Chief Complaint/ HPI  Charlene Patterson,  74 y.o. , female presents for their Follow-Up CCM visit with the clinical pharmacist In office.  PCP : Janith Lima, MD  Their chronic conditions include: HLD, hypertriglyceridemia, GERD, osteoporosis, CKD stage 3, depression  Pt is from [facility], lived in this area since 1965/[place]. Married in 28-Sep-1966. Built their own log house, fell off roof when building but was ok so didn't go to doctor. Husband died 09-28-2010, sold house 2015/09/28. Practically raised grandson. Son with cerebral palsy. Living with 6 family members.   Office Visits: 08/14/19 Dr Ronnald Ramp OV: c/o neck/back pain. No NSAIDS d/t GI hx. Start tramadol prn. Pt also would like to pursue surgical options, referral sent. 02/05/19 Dr Ronnald Ramp OV: symptomatic IDA, start iron infusions.  Consult Visit: 10/29/19 Dr Lindell Noe (neurourgery): OV for spinal stenosis, radiculopathy 09/29/19 Dr Maryjean Ka (pain mgmt): spinal injection 03/01/19 ED visit: intractable headache, improved with Tylenol.  02/2019: received 2 iron infusions  Medications: Outpatient Encounter Medications as of 04/20/2020  Medication Sig  . acetaminophen (TYLENOL) 650 MG CR tablet Take 650 mg by mouth every 8 (eight) hours as needed for pain.  Marland Kitchen aspirin-acetaminophen-caffeine (EXCEDRIN MIGRAINE) 250-250-65 MG tablet Take 1-2 tablets by mouth every 6 (six) hours as needed for headache.  Marland Kitchen atorvastatin (LIPITOR) 40 MG tablet TAKE 1 TABLET BY MOUTH DAILY.  . Calcium-Magnesium-Vitamin D (CALCIUM 1200+D3 PO) Take 2 capsules by mouth daily.  . citalopram (CELEXA) 10 MG tablet TAKE 1 TABLET EVERY DAY  . cyanocobalamin 2000 MCG tablet Take 1 tablet (2,000 mcg total) by mouth daily.  . Ergocalciferol (DRISDOL PO) Take 3,000 Units by mouth daily.  . mirtazapine (REMERON) 7.5 MG tablet TAKE 1 TABLET AT BEDTIME  . pantoprazole (PROTONIX) 40 MG tablet TAKE 1 TABLET BY  MOUTH DAILY.  Marland Kitchen Phenylephrine-Aspirin (ALKA-SELTZER PLUS SINUS PO) Take by mouth.  Marland Kitchen VASCEPA 1 g capsule TAKE 2 CAPSULES TWICE DAILY   No facility-administered encounter medications on file as of 04/20/2020.    Current Diagnosis/Assessment:   Goals Addressed            This Visit's Progress   . Pharmacy Care Plan       CARE PLAN ENTRY  Current Barriers:  . Chronic Disease Management support, education, and care coordination needs related to Hyperlipidemia and Osteoarthritis   Hyperlipidemia Lab Results  Component Value Date/Time   LDLDIRECT 99.0 08/14/2019 01:38 PM .  Pharmacist Clinical Goal(s): o Over the next 180 days, patient will work with PharmD and providers to maintain LDL goal < 100 and TRIG < 150 . Current regimen:  o atorvastatin 40 mg daily,  o Vascepa 1 gram - 2 capsules twice a day . Interventions: o Obtained copay assistance for Vascepa through [employer] o Discussed benefits of Vascepa including lowering triglyceridies and cardiovascular risk reduction . Patient self care activities - Over the next 180 days, patient will: o Continue medications as prescribed o Contact pharmacist with copay issues  Osteoarthritis . Pharmacist Clinical Goal(s) o Over the next 180 days, patient will work with PharmD and providers to optimize arthritis medications . Current regimen:  o Tylenol Arthritis 650 mg - up to 2 tablets twice a day . Interventions: o Discussed maximum dose of Tylenol is 3000 mg per day o Recommended Voltaren gel OTC to apply to joints . Patient self care activities - Over the next 180 days, patient  will: o Continue medications as above o Try Voltaren gel for arthritis pain  Migraine . Pharmacist Clinical Goal(s) o Over the next 180 days, patient will work with PharmD and providers to optimize therapy . Current regimen:  o Excedrin extra strength o Excedrin migraine o Alka seltzer + sinus . Interventions: o Discussed prescription  options for migraine relief, including triptans and preventative medications. Patient would prefer a medication she can take as needed when migraines occur, so recommended patient see PCP to discuss a triptan . Patient self care activities - Over the next 180 days, patient will: o Schedule visit with PCP to discuss migraine relief  Medication management . Pharmacist Clinical Goal(s): o Over the next 180 days, patient will work with PharmD and providers to maintain optimal medication adherence . Current pharmacy: [facility] mail order . Interventions o Comprehensive medication review performed. o Continue current medication management strategy . Patient self care activities - Over the next 180 days, patient will: o Focus on medication adherence by patient report o Take medications as prescribed o Report any questions or concerns to PharmD and/or provider(s)  Please see past updates related to this goal by clicking on the "Past Updates" button in the selected goal        Osteoarthritis   Patient has failed these meds in past: n/a Patient is currently uncontrolled on the following medications:    Tylenol 650 mg - 2 tablets q AM, 1 tablet in afternoon.   We discussed: maximum daily Tylenol limits 3g/day; recommended Voltaren gel for topical relief; discussed pathophysiology of OA and Tylenol as first line treatment.  Plan  Continue current medications  Recommend OTC Voltaren gel for topical relief  Migraine/Headache   Patient has failed these meds in past: n/a Patient is currently controlled on the following medications:  . Excedrin extra strength . Excedrin migraine . Alka seltzer + sinus  We discussed:  Pt reports 3 migraines in the past month.   Plan  Continue current medications   Vaccines   Reviewed and discussed patient's vaccination history.    Immunization History  Administered Date(s) Administered  . Fluad Quad(high Dose 65+) 08/14/2019  . Influenza, High Dose  Seasonal PF 07/30/2017, 09/02/2018  . Influenza,inj,Quad PF,6+ Mos 04/14/2014, 05/11/2015  . Influenza-Unspecified 04/02/2012  . Pneumococcal Conjugate-13 05/11/2015  . Pneumococcal Polysaccharide-23 12/13/2012, 07/30/2017  . Tdap 12/13/2012   We discussed: pt has not received COVID vaccine, she reports she is against the vaccine and has no plans to receive it.  Plan  Patient declined COVID vaccine. Continue to reassess   Medication Management   Pt uses [facility] for all medications Uses pill tray in a drawer - writes on bottles once or twice a day Pt endorses 95% compliance  We discussed: [employer] sends 4 meds every 3 months on same day. Pt orders next refill when she is about halfway through bottles. She used to forget to re-order meds and would have to go without for several days, but this is no longer an issue for her.  Plan  Continue current med management strategy    Follow up: 6 month phone visit  Charlene Brooke, PharmD, [facility] Clinical Pharmacist [facility] at [facility] [phone]

## 2020-04-20 NOTE — Patient Instructions (Addendum)
Visit Information  Phone number for Pharmacist: [phone]  Goals Addressed            This Visit's Progress   . Pharmacy Care Plan       CARE PLAN ENTRY  Current Barriers:  . Chronic Disease Management support, education, and care coordination needs related to Hyperlipidemia and Osteoarthritis   Hyperlipidemia Lab Results  Component Value Date/Time   LDLDIRECT 99.0 08/14/2019 01:38 PM .  Pharmacist Clinical Goal(s): o Over the next 180 days, patient will work with PharmD and providers to maintain LDL goal < 100 and TRIG < 150 . Current regimen:  o atorvastatin 40 mg daily,  o Vascepa 1 gram - 2 capsules twice a day . Interventions: o Obtained copay assistance for Vascepa through [employer] o Discussed benefits of Vascepa including lowering triglyceridies and cardiovascular risk reduction . Patient self care activities - Over the next 180 days, patient will: o Continue medications as prescribed o Contact pharmacist with copay issues  Osteoarthritis . Pharmacist Clinical Goal(s) o Over the next 180 days, patient will work with PharmD and providers to optimize arthritis medications . Current regimen:  o Tylenol Arthritis 650 mg - up to 2 tablets twice a day . Interventions: o Discussed maximum dose of Tylenol is 3000 mg per day o Recommended Voltaren gel OTC to apply to joints . Patient self care activities - Over the next 180 days, patient will: o Continue medications as above o Try Voltaren gel for arthritis pain  Migraine . Pharmacist Clinical Goal(s) o Over the next 180 days, patient will work with PharmD and providers to optimize therapy . Current regimen:  o Excedrin extra strength o Excedrin migraine o Alka seltzer + sinus . Interventions: o Discussed prescription options for migraine relief, including triptans and preventative medications. Patient would prefer a medication she can take as needed when migraines occur, so recommended patient see  PCP to discuss a triptan . Patient self care activities - Over the next 180 days, patient will: o Schedule visit with PCP to discuss migraine relief  Medication management . Pharmacist Clinical Goal(s): o Over the next 180 days, patient will work with PharmD and providers to maintain optimal medication adherence . Current pharmacy: [facility] mail order . Interventions o Comprehensive medication review performed. o Continue current medication management strategy . Patient self care activities - Over the next 180 days, patient will: o Focus on medication adherence by patient report o Take medications as prescribed o Report any questions or concerns to PharmD and/or provider(s)  Please see past updates related to this goal by clicking on the "Past Updates" button in the selected goal       Patient verbalizes understanding of instructions provided today.  Telephone follow up appointment with pharmacy team member scheduled for: 6 months  Charlene Brooke, PharmD, BCACP Clinical Pharmacist [facility] at [facility] [phone]  Migraine Headache A migraine headache is an intense, throbbing pain on one side or both sides of the head. Migraine headaches may also cause other symptoms, such as nausea, vomiting, and sensitivity to light and noise. A migraine headache can last from 4 hours to 3 days. Talk with your doctor about what things may bring on (trigger) your migraine headaches. What are the causes? The exact cause of this condition is not known. However, a migraine may be caused when nerves in the brain become irritated and release chemicals that cause inflammation of blood vessels. This inflammation causes pain. This condition may be triggered  or caused by:  Drinking alcohol.  Smoking.  Taking medicines, such as: ? Medicine used to treat chest pain (nitroglycerin). ? Birth control pills. ? Estrogen. ? Certain blood pressure medicines.  Eating or drinking products  that contain nitrates, glutamate, aspartame, or tyramine. Aged cheeses, chocolate, or caffeine may also be triggers.  Doing physical activity. Other things that may trigger a migraine headache include:  Menstruation.  Pregnancy.  Hunger.  Stress.  Lack of sleep or too much sleep.  Weather changes.  Fatigue. What increases the risk? The following factors may make you more likely to experience migraine headaches:  Being a certain age. This condition is more common in people who are 73-15 years old.  Being female.  Having a family history of migraine headaches.  Being Caucasian.  Having a mental health condition, such as depression or anxiety.  Being obese. What are the signs or symptoms? The main symptom of this condition is pulsating or throbbing pain. This pain may:  Happen in any area of the head, such as on one side or both sides.  Interfere with daily activities.  Get worse with physical activity.  Get worse with exposure to bright lights or loud noises. Other symptoms may include:  Nausea.  Vomiting.  Dizziness.  General sensitivity to bright lights, loud noises, or smells. Before you get a migraine headache, you may get warning signs (an aura). An aura may include:  Seeing flashing lights or having blind spots.  Seeing bright spots, halos, or zigzag lines.  Having tunnel vision or blurred vision.  Having numbness or a tingling feeling.  Having trouble talking.  Having muscle weakness. Some people have symptoms after a migraine headache (postdromal phase), such as:  Feeling tired.  Difficulty concentrating. How is this diagnosed? A migraine headache can be diagnosed based on:  Your symptoms.  A physical exam.  Tests, such as: ? CT scan or an MRI of the head. These imaging tests can help rule out other causes of headaches. ? Taking fluid from the spine (lumbar puncture) and analyzing it (cerebrospinal fluid analysis, or CSF  analysis). How is this treated? This condition may be treated with medicines that:  Relieve pain.  Relieve nausea.  Prevent migraine headaches. Treatment for this condition may also include:  Acupuncture.  Lifestyle changes like avoiding foods that trigger migraine headaches.  Biofeedback.  Cognitive behavioral therapy. Follow these instructions at home: Medicines  Take over-the-counter and prescription medicines only as told by your health care provider.  Ask your health care provider if the medicine prescribed to you: ? Requires you to avoid driving or using heavy machinery. ? Can cause constipation. You may need to take these actions to prevent or treat constipation:  Drink enough fluid to keep your urine pale yellow.  Take over-the-counter or prescription medicines.  Eat foods that are high in fiber, such as beans, whole grains, and fresh fruits and vegetables.  Limit foods that are high in fat and processed sugars, such as fried or sweet foods. Lifestyle  Do not drink alcohol.  Do not use any products that contain nicotine or tobacco, such as cigarettes, e-cigarettes, and chewing tobacco. If you need help quitting, ask your health care provider.  Get at least 8 hours of sleep every night.  Find ways to manage stress, such as meditation, deep breathing, or yoga. General instructions      Keep a journal to find out what may trigger your migraine headaches. For example, write down: ? What you  eat and drink. ? How much sleep you get. ? Any change to your diet or medicines.  If you have a migraine headache: ? Avoid things that make your symptoms worse, such as bright lights. ? It may help to lie down in a dark, quiet room. ? Do not drive or use heavy machinery. ? Ask your health care provider what activities are safe for you while you are experiencing symptoms.  Keep all follow-up visits as told by your health care provider. This is important. Contact a  health care provider if:  You develop symptoms that are different or more severe than your usual migraine headache symptoms.  You have more than 15 headache days in one month. Get help right away if:  Your migraine headache becomes severe.  Your migraine headache lasts longer than 72 hours.  You have a fever.  You have a stiff neck.  You have vision loss.  Your muscles feel weak or like you cannot control them.  You start to lose your balance often.  You have trouble walking.  You faint.  You have a seizure. Summary  A migraine headache is an intense, throbbing pain on one side or both sides of the head. Migraines may also cause other symptoms, such as nausea, vomiting, and sensitivity to light and noise.  This condition may be treated with medicines and lifestyle changes. You may also need to avoid certain things that trigger a migraine headache.  Keep a journal to find out what may trigger your migraine headaches.  Contact your health care provider if you have more than 15 headache days in a month or you develop symptoms that are different or more severe than your usual migraine headache symptoms. This information is not intended to replace advice given to you by your health care provider. Make sure you discuss any questions you have with your health care provider. Document Revised: 10/11/2018 Document Reviewed: 08/01/2018 Elsevier Patient Education  [place].

## 2020-04-28 ENCOUNTER — Other Ambulatory Visit: Payer: Self-pay

## 2020-04-28 ENCOUNTER — Ambulatory Visit (INDEPENDENT_AMBULATORY_CARE_PROVIDER_SITE_OTHER): Payer: Medicare HMO

## 2020-04-28 ENCOUNTER — Ambulatory Visit (HOSPITAL_COMMUNITY)
Admission: EM | Admit: 2020-04-28 | Discharge: 2020-04-28 | Disposition: A | Discharge status: Home / Self Care | Payer: Medicare HMO | Attending: Family Medicine | Admitting: Family Medicine

## 2020-04-28 ENCOUNTER — Encounter (HOSPITAL_COMMUNITY): Payer: Self-pay | Admitting: Emergency Medicine

## 2020-04-28 DIAGNOSIS — M79631 Pain in right forearm: Secondary | ICD-10-CM | POA: Diagnosis not present

## 2020-04-28 DIAGNOSIS — M654 Radial styloid tenosynovitis [de Quervain]: Secondary | ICD-10-CM

## 2020-04-28 DIAGNOSIS — W19XXXA Unspecified fall, initial encounter: Secondary | ICD-10-CM | POA: Diagnosis not present

## 2020-04-28 DIAGNOSIS — M79601 Pain in right arm: Secondary | ICD-10-CM

## 2020-04-28 NOTE — ED Triage Notes (Signed)
Pt presents with right arm pain xs 1 week. States fell in grass about 1 wk ago.

## 2020-04-29 NOTE — ED Provider Notes (Signed)
[name]   [id number] 04/28/20 Arrival Time: 8786  ASSESSMENT & PLAN:  1. Right forearm pain   2. De Quervain's tenosynovitis, right     I have personally viewed the imaging studies ordered this visit. No fracture appreciated.   Orders Placed This Encounter  Procedures  . DG Forearm Right   Thumb spica to wear for the next week. Continue Tylenol.  Recommend:  Follow-up Information    Schedule an appointment as soon as possible for a visit  with [facility].   Contact information: [address] [place] [phone]              Reviewed expectations re: course of current medical issues. Questions answered. Outlined signs and symptoms indicating need for more acute intervention. Patient verbalized understanding. After Visit Summary given.  SUBJECTIVE: History from: patient. Charlene Patterson is a 74 y.o. female who reports pain of R distal forearm extending over wrist into thumb. Noticed several days after falling. Pain worse with wrist movements. No extremity sensation changes or weakness. Tylenol with mild relief.  Past Surgical History:  Procedure Laterality Date  . ABDOMINAL HYSTERECTOMY  ~ 1978   "partial"  . APPENDECTOMY  ~ 1978  . COLONOSCOPY  2005   last 2005  . POLYPECTOMY        OBJECTIVE:  Vitals:   04/28/20 1619  BP: 129/78  Pulse: 70  Resp: 17  Temp: 98.3 F (36.8 C)  TempSrc: Oral  SpO2: 96%    General appearance: alert; no distress HEENT: [place]; AT Neck: supple with FROM Resp: unlabored respirations Extremities: RUE: warm with well perfused appearance; pain reported over radial side of wrist, more notable with thumb and wrist movement; no erythema or inflammation; no swelling; FROM; very tender over radial styloid Skin: warm and dry; no visible rashes Neurologic: gait normal; normal sensation and strength of RUE Psychological: alert and cooperative; normal  mood and affect  Imaging: DG Forearm Right  Result Date: 04/28/2020 CLINICAL DATA:  Acute right arm pain after fall last week. EXAM: RIGHT FOREARM - 2 VIEW COMPARISON:  None. FINDINGS: There is no evidence of fracture or other focal bone lesions. Soft tissues are unremarkable. IMPRESSION: Negative. Electronically Signed   By: Marijo Conception M.D.   On: 04/28/2020 16:42      No Known Allergies  Past Medical History:  Diagnosis Date  . Allergy   . Anxiety   . Arthritis    "left foot" (05/26/2014)  . Chronic back pain   . Chronic neck pain   . Depression   . GERD (gastroesophageal reflux disease)   . History of chicken pox   . History of colon polyps   . History of hiatal hernia   . Lower GI bleeding   . Migraines    "monthly usually; weekly lately" (05/26/2014)  . Sciatica   . Thoracic compression fracture [facility]    Social History   Socioeconomic History  . Marital status: Widowed    Spouse name: Not on file  . Number of children: 2  . Years of education: 60  . Highest education level: Not on file  Occupational History  . Occupation: Retired  Tobacco Use  . Smoking status: Never Smoker  . Smokeless tobacco: Never Used  Vaping Use  . Vaping Use: Never used  Substance and Sexual Activity  . Alcohol use: No    Alcohol/week: 0.0 standard drinks  . Drug use: No  .  Sexual activity: Never  Other Topics Concern  . Not on file  Social History Narrative   Regular exercise-no   Caffeine Use-yes   Social Determinants of Health   Financial Resource Strain: Low Risk   . Difficulty of Paying Living Expenses: Not very hard  Food Insecurity:   . Worried About Charity fundraiser in the Last Year: Not on file  . Ran Out of Food in the Last Year: Not on file  Transportation Needs:   . Lack of Transportation (Medical): Not on file  . Lack of Transportation (Non-Medical): Not on file  Physical Activity:   . Days of Exercise per Week: Not on file  . Minutes of Exercise  per Session: Not on file  Stress:   . Feeling of Stress : Not on file  Social Connections:   . Frequency of Communication with Friends and Family: Not on file  . Frequency of Social Gatherings with Friends and Family: Not on file  . Attends Religious Services: Not on file  . Active Member of Clubs or Organizations: Not on file  . Attends Archivist Meetings: Not on file  . Marital Status: Not on file   Family History  Problem Relation Age of Onset  . Heart disease Mother   . Heart disease Father   . Prostate cancer Father   . Diabetes Sister   . Stomach cancer Sister 79  . Diabetes Brother   . Heart disease Brother   . Rectal cancer Neg Hx   . Colon cancer Neg Hx    Past Surgical History:  Procedure Laterality Date  . ABDOMINAL HYSTERECTOMY  ~ 1978   "partial"  . APPENDECTOMY  ~ 1978  . COLONOSCOPY  2005   last 2005  . POLYPECTOMY        Vanessa Kick, MD 04/29/20 [phone]

## 2020-05-03 ENCOUNTER — Other Ambulatory Visit: Payer: Self-pay

## 2020-05-03 ENCOUNTER — Ambulatory Visit: Payer: Medicare HMO | Admitting: Family Medicine

## 2020-05-03 ENCOUNTER — Encounter: Payer: Self-pay | Admitting: Family Medicine

## 2020-05-03 VITALS — BP 122/80 | Ht 64.0 in | Wt 137.0 lb

## 2020-05-03 DIAGNOSIS — M25531 Pain in right wrist: Secondary | ICD-10-CM

## 2020-05-03 NOTE — Progress Notes (Signed)
PCP: Janith Lima, MD  Subjective:   HPI: Patient is a 74 y.o. female here for right wrist pain.  Patient has history of chronic neck and back pain - sees Dr. Ronnald Ramp for this. She reports over a week ago she was pulling grass in some beds in her yard when she started to develop pain on radial aspect of her right forearm. Noticed associated swelling and warmth in this area. This worsened so she went to urgent care - diagnosed with de quervains and has been using a thumb spica brace since. She states this has helped though has started to get pain in right posterior shoulder with tingling into upper arm. No acute injury or trauma.  Past Medical History:  Diagnosis Date  . Allergy   . Anxiety   . Arthritis    "left foot" (05/26/2014)  . Chronic back pain   . Chronic neck pain   . Depression   . GERD (gastroesophageal reflux disease)   . History of chicken pox   . History of colon polyps   . History of hiatal hernia   . Lower GI bleeding   . Migraines    "monthly usually; weekly lately" (05/26/2014)  . Sciatica   . Thoracic compression fracture [facility])     Current Outpatient Medications on File Prior to Visit  Medication Sig Dispense Refill  . acetaminophen (TYLENOL) 650 MG CR tablet Take 650 mg by mouth every 8 (eight) hours as needed for pain.    Marland Kitchen aspirin-acetaminophen-caffeine (EXCEDRIN MIGRAINE) 250-250-65 MG tablet Take 1-2 tablets by mouth every 6 (six) hours as needed for headache.    Marland Kitchen atorvastatin (LIPITOR) 40 MG tablet TAKE 1 TABLET BY MOUTH DAILY. 90 tablet 1  . Calcium-Magnesium-Vitamin D (CALCIUM 1200+D3 PO) Take 2 capsules by mouth daily.    . citalopram (CELEXA) 10 MG tablet TAKE 1 TABLET EVERY DAY 90 tablet 0  . cyanocobalamin 2000 MCG tablet Take 1 tablet (2,000 mcg total) by mouth daily. 90 tablet 1  . Ergocalciferol (DRISDOL PO) Take 3,000 Units by mouth daily.    . mirtazapine (REMERON) 7.5 MG tablet TAKE 1 TABLET AT BEDTIME 90 tablet 0  . pantoprazole  (PROTONIX) 40 MG tablet TAKE 1 TABLET BY MOUTH DAILY. 90 tablet 1  . Phenylephrine-Aspirin (ALKA-SELTZER PLUS SINUS PO) Take by mouth.    Marland Kitchen VASCEPA 1 g capsule TAKE 2 CAPSULES TWICE DAILY 360 capsule 1   No current facility-administered medications on file prior to visit.    Past Surgical History:  Procedure Laterality Date  . ABDOMINAL HYSTERECTOMY  ~ 1978   "partial"  . APPENDECTOMY  ~ 1978  . COLONOSCOPY  2005   last 2005  . POLYPECTOMY      No Known Allergies  Social History   Socioeconomic History  . Marital status: Widowed    Spouse name: Not on file  . Number of children: 2  . Years of education: 41  . Highest education level: Not on file  Occupational History  . Occupation: Retired  Tobacco Use  . Smoking status: Never Smoker  . Smokeless tobacco: Never Used  Vaping Use  . Vaping Use: Never used  Substance and Sexual Activity  . Alcohol use: No    Alcohol/week: 0.0 standard drinks  . Drug use: No  . Sexual activity: Never  Other Topics Concern  . Not on file  Social History Narrative   Regular exercise-no   Caffeine Use-yes   Social Determinants of Radio broadcast assistant  Strain: Low Risk   . Difficulty of Paying Living Expenses: Not very hard  Food Insecurity:   . Worried About Charity fundraiser in the Last Year: Not on file  . Ran Out of Food in the Last Year: Not on file  Transportation Needs:   . Lack of Transportation (Medical): Not on file  . Lack of Transportation (Non-Medical): Not on file  Physical Activity:   . Days of Exercise per Week: Not on file  . Minutes of Exercise per Session: Not on file  Stress:   . Feeling of Stress : Not on file  Social Connections:   . Frequency of Communication with Friends and Family: Not on file  . Frequency of Social Gatherings with Friends and Family: Not on file  . Attends Religious Services: Not on file  . Active Member of Clubs or Organizations: Not on file  . Attends Archivist  Meetings: Not on file  . Marital Status: Not on file  Intimate Partner Violence:   . Fear of Current or Ex-Partner: Not on file  . Emotionally Abused: Not on file  . Physically Abused: Not on file  . Sexually Abused: Not on file    Family History  Problem Relation Age of Onset  . Heart disease Mother   . Heart disease Father   . Prostate cancer Father   . Diabetes Sister   . Stomach cancer Sister 74  . Diabetes Brother   . Heart disease Brother   . Rectal cancer Neg Hx   . Colon cancer Neg Hx     BP 122/80   Ht 5\' 4"  (1.626 m)   Wt 137 lb (62.1 kg)   BMI 23.52 kg/m   [facility] Adult Exercise 05/03/2020  Frequency of aerobic exercise (# of days/week) 7  Average time in minutes 60  Frequency of strengthening activities (# of days/week) 0    No flowsheet data found.  Review of Systems: See HPI above.     Objective:  Physical Exam:  Gen: NAD, comfortable in exam room  Right hand/wrist: No deformity. FROM with 5/5 strength, mild pain thumb abduction Mild tenderness to palpation over intersection of 1st and 2nd dorsal compartments. NVI distally. Negative finkelstein's  Neck: No gross deformity, swelling, bruising. TTP right trapezius.  No midline/bony TTP. Full flexion, 30 degrees bilateral rotations, 5 degrees extension. BUE strength 5/5.   Sensation intact to light touch.   2+ equal reflexes in triceps, biceps, brachioradialis tendons. Negative spurlings. NV intact distal BUEs.  Limited MSK u/s right wrist: increased fluid between 1st and 2nd dorsal compartments and within 2nd dorsal compartment.  Assessment & Plan:  1. 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  Icing.  Consider steroid injection if not improving.  F/u in 4 weeks.

## 2020-05-03 NOTE — Patient Instructions (Signed)
You have intersection syndrome of your wrist. Avoid painful activities as much as possible. Wear the thumb spica brace as often as possible to rest this but ok to take off to sleep, wash the area, ice it. Try voltaren (diclofenac) gel topically up to 4 times a day. Ok to take tylenol with this if you need to. Ice 15 minutes at a time 3-4 times a day. A cortisone injection typically helps a great deal with this and is an option if you don't continue to improve. Follow up with me in 4 weeks for reevaluation.

## 2020-05-20 ENCOUNTER — Encounter: Payer: Self-pay | Admitting: Internal Medicine

## 2020-05-20 ENCOUNTER — Ambulatory Visit (INDEPENDENT_AMBULATORY_CARE_PROVIDER_SITE_OTHER): Payer: Medicare HMO | Admitting: Internal Medicine

## 2020-05-20 ENCOUNTER — Other Ambulatory Visit: Payer: Self-pay

## 2020-05-20 VITALS — BP 126/78 | HR 70 | Temp 98.6°F | Resp 16 | Ht 64.0 in | Wt 140.0 lb

## 2020-05-20 DIAGNOSIS — M48061 Spinal stenosis, lumbar region without neurogenic claudication: Secondary | ICD-10-CM

## 2020-05-20 DIAGNOSIS — E559 Vitamin D deficiency, unspecified: Secondary | ICD-10-CM

## 2020-05-20 DIAGNOSIS — M159 Polyosteoarthritis, unspecified: Secondary | ICD-10-CM

## 2020-05-20 DIAGNOSIS — N1831 Chronic kidney disease, stage 3a: Secondary | ICD-10-CM | POA: Diagnosis not present

## 2020-05-20 DIAGNOSIS — Z0000 Encounter for general adult medical examination without abnormal findings: Secondary | ICD-10-CM | POA: Diagnosis not present

## 2020-05-20 DIAGNOSIS — D513 Other dietary vitamin B12 deficiency anemia: Secondary | ICD-10-CM

## 2020-05-20 DIAGNOSIS — D500 Iron deficiency anemia secondary to blood loss (chronic): Secondary | ICD-10-CM | POA: Diagnosis not present

## 2020-05-20 DIAGNOSIS — E785 Hyperlipidemia, unspecified: Secondary | ICD-10-CM

## 2020-05-20 DIAGNOSIS — F32A Depression, unspecified: Secondary | ICD-10-CM

## 2020-05-20 DIAGNOSIS — M4802 Spinal stenosis, cervical region: Secondary | ICD-10-CM | POA: Diagnosis not present

## 2020-05-20 DIAGNOSIS — F329 Major depressive disorder, single episode, unspecified: Secondary | ICD-10-CM

## 2020-05-20 DIAGNOSIS — M8949 Other hypertrophic osteoarthropathy, multiple sites: Secondary | ICD-10-CM

## 2020-05-20 DIAGNOSIS — F450 Somatization disorder: Secondary | ICD-10-CM

## 2020-05-20 MED ORDER — DULOXETINE HCL 30 MG PO CPEP: 30.0000 mg | ORAL_CAPSULE | Freq: Every day | ORAL | 0 refills | Status: DC

## 2020-05-20 MED ORDER — OXYCODONE HCL 5 MG PO TABS: 5.0000 mg | ORAL_TABLET | Freq: Four times a day (QID) | ORAL | 0 refills | Status: DC | PRN

## 2020-05-20 NOTE — Patient Instructions (Signed)
Health Maintenance, Female Adopting a healthy lifestyle and getting preventive care are important in promoting health and wellness. Ask your health care provider about:  The right schedule for you to have regular tests and exams.  Things you can do on your own to prevent diseases and keep yourself healthy. What should I know about diet, weight, and exercise? Eat a healthy diet   Eat a diet that includes plenty of vegetables, fruits, low-fat dairy products, and lean protein.  Do not eat a lot of foods that are high in solid fats, added sugars, or sodium. Maintain a healthy weight Body mass index (BMI) is used to identify weight problems. It estimates body fat based on height and weight. Your health care provider can help determine your BMI and help you achieve or maintain a healthy weight. Get regular exercise Get regular exercise. This is one of the most important things you can do for your health. Most adults should:  Exercise for at least 150 minutes each week. The exercise should increase your heart rate and make you sweat (moderate-intensity exercise).  Do strengthening exercises at least twice a week. This is in addition to the moderate-intensity exercise.  Spend less time sitting. Even light physical activity can be beneficial. Watch cholesterol and blood lipids Have your blood tested for lipids and cholesterol at 74 years of age, then have this test every 5 years. Have your cholesterol levels checked more often if:  Your lipid or cholesterol levels are high.  You are older than 73 years of age.  You are at high risk for heart disease. What should I know about cancer screening? Depending on your health history and family history, you may need to have cancer screening at various ages. This may include screening for:  Breast cancer.  Cervical cancer.  Colorectal cancer.  Skin cancer.  Lung cancer. What should I know about heart disease, diabetes, and high blood  pressure? Blood pressure and heart disease  High blood pressure causes heart disease and increases the risk of stroke. This is more likely to develop in people who have high blood pressure readings, are of African descent, or are overweight.  Have your blood pressure checked: ? Every 3-5 years if you are 18-39 years of age. ? Every year if you are 40 years old or older. Diabetes Have regular diabetes screenings. This checks your fasting blood sugar level. Have the screening done:  Once every three years after age 40 if you are at a normal weight and have a low risk for diabetes.  More often and at a younger age if you are overweight or have a high risk for diabetes. What should I know about preventing infection? Hepatitis B If you have a higher risk for hepatitis B, you should be screened for this virus. Talk with your health care provider to find out if you are at risk for hepatitis B infection. Hepatitis C Testing is recommended for:  Everyone born from 1945 through 1965.  Anyone with known risk factors for hepatitis C. Sexually transmitted infections (STIs)  Get screened for STIs, including gonorrhea and chlamydia, if: ? You are sexually active and are younger than 73 years of age. ? You are older than 73 years of age and your health care provider tells you that you are at risk for this type of infection. ? Your sexual activity has changed since you were last screened, and you are at increased risk for chlamydia or gonorrhea. Ask your health care provider if   you are at risk.  Ask your health care provider about whether you are at high risk for HIV. Your health care provider may recommend a prescription medicine to help prevent HIV infection. If you choose to take medicine to prevent HIV, you should first get tested for HIV. You should then be tested every 3 months for as long as you are taking the medicine. Pregnancy  If you are about to stop having your period (premenopausal) and  you may become pregnant, seek counseling before you get pregnant.  Take 400 to 800 micrograms (mcg) of folic acid every day if you become pregnant.  Ask for birth control (contraception) if you want to prevent pregnancy. Osteoporosis and menopause Osteoporosis is a disease in which the bones lose minerals and strength with aging. This can result in bone fractures. If you are 65 years old or older, or if you are at risk for osteoporosis and fractures, ask your health care provider if you should:  Be screened for bone loss.  Take a calcium or vitamin D supplement to lower your risk of fractures.  Be given hormone replacement therapy (HRT) to treat symptoms of menopause. Follow these instructions at home: Lifestyle  Do not use any products that contain nicotine or tobacco, such as cigarettes, e-cigarettes, and chewing tobacco. If you need help quitting, ask your health care provider.  Do not use street drugs.  Do not share needles.  Ask your health care provider for help if you need support or information about quitting drugs. Alcohol use  Do not drink alcohol if: ? Your health care provider tells you not to drink. ? You are pregnant, may be pregnant, or are planning to become pregnant.  If you drink alcohol: ? Limit how much you use to 0-1 drink a day. ? Limit intake if you are breastfeeding.  Be aware of how much alcohol is in your drink. In the [place] one drink equals one 12 oz bottle of beer (355 mL), one 5 oz glass of wine (148 mL), or one 1 oz glass of hard liquor (44 mL). General instructions  Schedule regular health, dental, and eye exams.  Stay current with your vaccines.  Tell your health care provider if: ? You often feel depressed. ? You have ever been abused or do not feel safe at home. Summary  Adopting a healthy lifestyle and getting preventive care are important in promoting health and wellness.  Follow your health care provider's instructions about healthy  diet, exercising, and getting tested or screened for diseases.  Follow your health care provider's instructions on monitoring your cholesterol and blood pressure. This information is not intended to replace advice given to you by your health care provider. Make sure you discuss any questions you have with your health care provider. Document Revised: 06/12/2018 Document Reviewed: 06/12/2018 Elsevier Patient Education  2020 Elsevier Inc.  

## 2020-05-20 NOTE — Progress Notes (Signed)
Subjective:  Patient ID: Charlene Patterson, female    DOB: 03-09-1946  Age: 74 y.o. MRN: [id number]  CC: Annual Exam, Osteoarthritis, Hyperlipidemia, and Depression  This visit occurred during the SARS-CoV-2 public health emergency.  Safety protocols were in place, including screening questions prior to the visit, additional usage of staff PPE, and extensive cleaning of exam room while observing appropriate contact time as indicated for disinfecting solutions.    HPI Charlene Patterson presents for a CPX.  She continues to complain of pain.  She complains of right-sided neck pain that radiates towards her right shoulder.  She complains of chronic, nonradiating low back pain.  She has pain in her large joints.  She is taking acetaminophen and several over-the-counter anti-inflammatories without much relief.  She tells me the pain interferes with her activities of daily living as well as her sleep.  She is not willing to consider any surgical interventions.  She continues to feel depressed.  She complains of anhedonia and at times feeling helpless.  She does not feel hopeless and denies SI or HI.  Outpatient Medications Prior to Visit  Medication Sig Dispense Refill  . acetaminophen (TYLENOL) 650 MG CR tablet Take 650 mg by mouth every 8 (eight) hours as needed for pain.    Marland Kitchen atorvastatin (LIPITOR) 40 MG tablet TAKE 1 TABLET BY MOUTH DAILY. 90 tablet 1  . Calcium-Magnesium-Vitamin D (CALCIUM 1200+D3 PO) Take 2 capsules by mouth daily.    . cyanocobalamin 2000 MCG tablet Take 1 tablet (2,000 mcg total) by mouth daily. 90 tablet 1  . Ergocalciferol (DRISDOL PO) Take 3,000 Units by mouth daily.    . mirtazapine (REMERON) 7.5 MG tablet TAKE 1 TABLET AT BEDTIME 90 tablet 0  . pantoprazole (PROTONIX) 40 MG tablet TAKE 1 TABLET BY MOUTH DAILY. 90 tablet 1  . VASCEPA 1 g capsule TAKE 2 CAPSULES TWICE DAILY 360 capsule 1  . aspirin-acetaminophen-caffeine (EXCEDRIN MIGRAINE) 250-250-65 MG tablet Take 1-2  tablets by mouth every 6 (six) hours as needed for headache.    . citalopram (CELEXA) 10 MG tablet TAKE 1 TABLET EVERY DAY 90 tablet 0  . Phenylephrine-Aspirin (ALKA-SELTZER PLUS SINUS PO) Take by mouth.     No facility-administered medications prior to visit.    ROS Review of Systems  Constitutional: Negative for chills, diaphoresis, fatigue and fever.  HENT: Negative.   Eyes: Negative.   Respiratory: Negative for cough, chest tightness, shortness of breath and wheezing.   Cardiovascular: Negative for chest pain, palpitations and leg swelling.  Gastrointestinal: Negative for abdominal pain, constipation, diarrhea, nausea and vomiting.  Endocrine: Negative.   Genitourinary: Negative.  Negative for difficulty urinating, dysuria and hematuria.  Musculoskeletal: Positive for arthralgias, back pain and neck pain. Negative for myalgias.  Skin: Negative.  Negative for color change, pallor and rash.  Neurological: Negative.  Negative for dizziness, weakness, light-headedness and headaches.  Hematological: Negative for adenopathy. Does not bruise/bleed easily.  Psychiatric/Behavioral: Negative.     Objective:  BP 126/78   Pulse 70   Temp 98.6 F (37 C) (Oral)   Resp 16   Ht 5\' 4"  (1.626 m)   Wt 140 lb (63.5 kg)   SpO2 97%   BMI 24.03 kg/m   BP Readings from Last 3 Encounters:  05/20/20 126/78  05/03/20 122/80  04/28/20 129/78    Wt Readings from Last 3 Encounters:  05/20/20 140 lb (63.5 kg)  05/03/20 137 lb (62.1 kg)  10/13/19 140 lb (63.5 kg)  Physical Exam Vitals reviewed.  Constitutional:      Appearance: Normal appearance.  HENT:     Nose: Nose normal.     Mouth/Throat:     Mouth: Mucous membranes are moist.  Eyes:     General: No scleral icterus.    Conjunctiva/sclera: Conjunctivae normal.  Cardiovascular:     Rate and Rhythm: Normal rate and regular rhythm.     Heart sounds: No murmur heard.   Pulmonary:     Effort: Pulmonary effort is normal.      Breath sounds: No stridor. No wheezing, rhonchi or rales.  Abdominal:     General: Abdomen is flat. Bowel sounds are normal. There is no distension.     Palpations: Abdomen is soft. There is no hepatomegaly, splenomegaly or mass.     Tenderness: There is no abdominal tenderness. There is no guarding.  Musculoskeletal:        General: Deformity (DJD) present. No swelling, tenderness or signs of injury.     Cervical back: Normal and neck supple.     Thoracic back: Normal.     Lumbar back: Normal.     Right lower leg: No edema.     Left lower leg: No edema.  Lymphadenopathy:     Cervical: No cervical adenopathy.  Skin:    General: Skin is warm and dry.     Coloration: Skin is not pale.  Neurological:     General: No focal deficit present.     Mental Status: She is alert.  Psychiatric:        Mood and Affect: Mood is depressed. Mood is not anxious. Affect is flat and tearful. Affect is not labile or blunt.        Speech: Speech normal.        Behavior: Behavior normal.        Thought Content: Thought content normal. Thought content is not paranoid or delusional. Thought content does not include homicidal or suicidal ideation.        Cognition and Memory: Cognition normal.        Judgment: Judgment normal.     Lab Results  Component Value Date   WBC 4.3 05/20/2020   HGB 12.5 05/20/2020   HCT 35.5 (L) 05/20/2020   PLT 327.0 05/20/2020   GLUCOSE 70 05/20/2020   CHOL 165 05/20/2020   TRIG (H) 05/20/2020    435.0 Triglyceride is over 400; calculations on Lipids are invalid.   HDL 28.50 (L) 05/20/2020   LDLDIRECT 56.0 05/20/2020   ALT 13 05/20/2020   AST 20 05/20/2020   NA 143 05/20/2020   K 3.5 05/20/2020   CL 107 05/20/2020   CREATININE 0.93 05/20/2020   BUN 17 05/20/2020   CO2 29 05/20/2020   TSH 2.97 05/20/2020   HGBA1C 5.6 07/30/2017    DG Forearm Right  Result Date: 04/28/2020 CLINICAL DATA:  Acute right arm pain after fall last week. EXAM: RIGHT FOREARM - 2 VIEW  COMPARISON:  None. FINDINGS: There is no evidence of fracture or other focal bone lesions. Soft tissues are unremarkable. IMPRESSION: Negative. Electronically Signed   By: Marijo Conception M.D.   On: 04/28/2020 16:42    Assessment & Plan:   Jeanet was seen today for annual exam, osteoarthritis, hyperlipidemia and depression.  Diagnoses and all orders for this visit:  Stage 3a chronic kidney disease ([place])- Her blood pressure is well controlled.  I recommended that she avoid NSAIDs. -     Basic metabolic panel;  Future -     Basic metabolic panel  Other dietary vitamin B12 deficiency anemia- Her H&H and B12 levels are normal.  She will continue high-dose oral B12 supplementation. -     Vitamin B12; Future -     CBC with Differential/Platelet; Future -     Folate; Future -     Folate -     CBC with Differential/Platelet -     Vitamin B12  Iron deficiency anemia due to chronic blood loss- Her H&H are normal now. -     CBC with Differential/Platelet; Future -     CBC with Differential/Platelet  Routine general medical examination at a health care facility- Exam completed, labs reviewed, vaccines reviewed and updated, cancer screenings are up-to-date, patient education was given.  Spinal stenosis in cervical region- She is not willing to consider a surgical intervention.  She has developed renal insufficiency so NSAIDs are contraindicated.  I recommended that she try oxycodone for the pain. -     oxyCODONE (OXY IR/ROXICODONE) 5 MG immediate release tablet; Take 1 tablet (5 mg total) by mouth every 6 (six) hours as needed for severe pain.  Spinal stenosis of lumbar region at multiple levels- See above. -     oxyCODONE (OXY IR/ROXICODONE) 5 MG immediate release tablet; Take 1 tablet (5 mg total) by mouth every 6 (six) hours as needed for severe pain.  Vitamin D deficiency- Her vitamin D level is normal now. -     VITAMIN D 25 Hydroxy (Vit-D Deficiency, Fractures); Future -     VITAMIN D 25  Hydroxy (Vit-D Deficiency, Fractures)  Hyperlipidemia with target LDL less than 130- She has achieved her LDL goal and is doing well on the statin. -     Lipid panel; Future -     TSH; Future -     Hepatic function panel; Future -     CK; Future -     CK -     Hepatic function panel -     TSH -     Lipid panel  Primary osteoarthritis involving multiple joints- I recommended that she transition from an SSRI to the SNRI duloxetine which can help with the pain of osteoarthritis.  NSAIDs are contraindicated due to renal insufficiency.  Her CRP is normal so I am not concerned about an inflammatory arthropathy.  I recommended that she control the pain with oxycodone as needed. -     C-reactive protein; Future -     DULoxetine (CYMBALTA) 30 MG capsule; Take 1 capsule (30 mg total) by mouth daily. -     oxyCODONE (OXY IR/ROXICODONE) 5 MG immediate release tablet; Take 1 tablet (5 mg total) by mouth every 6 (six) hours as needed for severe pain. -     C-reactive protein  Depression with somatization- I recommended she transition from an SSRI to an SNRI.  Will increase the dose of duloxetine over time. -     DULoxetine (CYMBALTA) 30 MG capsule; Take 1 capsule (30 mg total) by mouth daily.  Other orders -     LDL cholesterol, direct   I have discontinued Zehra L. Muellner's aspirin-acetaminophen-caffeine, Phenylephrine-Aspirin (ALKA-SELTZER PLUS SINUS PO), and citalopram. I am also having her start on DULoxetine and oxyCODONE. Additionally, I am having her maintain her Calcium-Magnesium-Vitamin D (CALCIUM 1200+D3 PO), acetaminophen, Ergocalciferol (DRISDOL PO), cyanocobalamin, atorvastatin, pantoprazole, mirtazapine, and Vascepa.  Meds ordered this encounter  Medications  . DULoxetine (CYMBALTA) 30 MG capsule    Sig: Take 1  capsule (30 mg total) by mouth daily.    Dispense:  30 capsule    Refill:  0  . oxyCODONE (OXY IR/ROXICODONE) 5 MG immediate release tablet    Sig: Take 1 tablet (5 mg total)  by mouth every 6 (six) hours as needed for severe pain.    Dispense:  75 tablet    Refill:  0   In addition to time spent on CPE, I spent 50 minutes in preparing to see the patient by review of recent labs, imaging and procedures, obtaining and reviewing separately obtained history, communicating with the patient and family or caregiver, ordering medications, tests or procedures, and documenting clinical information in the EHR including the differential Dx, treatment, and any further evaluation and other management of 1. Stage 3a chronic kidney disease (HCC) 2. Other dietary vitamin B12 deficiency anemia 3. Iron deficiency anemia due to chronic blood loss 4. Spinal stenosis in cervical region 5. Spinal stenosis of lumbar region at multiple levels 6. Vitamin D deficiency 7. Hyperlipidemia with target LDL less than 130 8. Primary osteoarthritis involving multiple joints 9. Depression with somatization      Follow-up: Return in about 3 months (around 08/20/2020).  Scarlette Calico, MD

## 2020-05-21 LAB — BASIC METABOLIC PANEL
BUN: 17 mg/dL (ref 6–23)
CO2: 29 mEq/L (ref 19–32)
Calcium: 9.8 mg/dL (ref 8.4–10.5)
Chloride: 107 mEq/L (ref 96–112)
Creatinine, Ser: 0.93 mg/dL (ref 0.40–1.20)
GFR: 60.91 mL/min (ref >60.00)
Glucose, Bld: 70 mg/dL (ref 70–99)
Potassium: 3.5 mEq/L (ref 3.5–5.1)
Sodium: 143 mEq/L (ref 135–145)

## 2020-05-21 LAB — CBC WITH DIFFERENTIAL/PLATELET
Basophils Absolute: 0 10*3/uL (ref 0.0–0.1)
Basophils Relative: 0.6 % (ref 0.0–3.0)
Eosinophils Absolute: 0.1 10*3/uL (ref 0.0–0.7)
Eosinophils Relative: 1.4 % (ref 0.0–5.0)
HCT: 35.5 % — ABNORMAL LOW (ref 36.0–46.0)
Hemoglobin: 12.5 g/dL (ref 12.0–15.0)
Lymphocytes Relative: 33.3 % (ref 12.0–46.0)
Lymphs Abs: 1.4 10*3/uL (ref 0.7–4.0)
MCHC: 35.4 g/dL (ref 30.0–36.0)
MCV: 88.4 fl (ref 78.0–100.0)
Monocytes Absolute: 0.3 10*3/uL (ref 0.1–1.0)
Monocytes Relative: 7.8 % (ref 3.0–12.0)
Neutro Abs: 2.5 10*3/uL (ref 1.4–7.7)
Neutrophils Relative %: 56.9 % (ref 43.0–77.0)
Platelets: 327 10*3/uL (ref 150.0–400.0)
RBC: 4.01 Mil/uL (ref 3.87–5.11)
RDW: 14.2 % (ref 11.5–15.5)
WBC: 4.3 10*3/uL (ref 4.0–10.5)

## 2020-05-21 LAB — LDL CHOLESTEROL, DIRECT: Direct LDL: 56 mg/dL

## 2020-05-21 LAB — FOLATE: Folate: 14.4 ng/mL (ref >5.9)

## 2020-05-21 LAB — LIPID PANEL
Cholesterol: 165 mg/dL (ref 0–200)
HDL: 28.5 mg/dL — ABNORMAL LOW (ref >39.00)
Total CHOL/HDL Ratio: 6
Triglycerides: 435 mg/dL — ABNORMAL HIGH (ref 0.0–149.0)

## 2020-05-21 LAB — HEPATIC FUNCTION PANEL
ALT: 13 U/L (ref 0–35)
AST: 20 U/L (ref 0–37)
Albumin: 4.1 g/dL (ref 3.5–5.2)
Alkaline Phosphatase: 114 U/L (ref 39–117)
Bilirubin, Direct: 0.1 mg/dL (ref 0.0–0.3)
Total Bilirubin: 0.4 mg/dL (ref 0.2–1.2)
Total Protein: 6.8 g/dL (ref 6.0–8.3)

## 2020-05-21 LAB — VITAMIN D 25 HYDROXY (VIT D DEFICIENCY, FRACTURES): VITD: 32.12 ng/mL (ref 30.00–100.00)

## 2020-05-21 LAB — VITAMIN B12: Vitamin B-12: >1526 pg/mL — ABNORMAL HIGH (ref 211–911)

## 2020-05-21 LAB — CK: Total CK: 43 U/L (ref 7–177)

## 2020-05-21 LAB — C-REACTIVE PROTEIN: CRP: <1 mg/dL (ref 0.5–20.0)

## 2020-05-21 LAB — TSH: TSH: 2.97 u[IU]/mL (ref 0.35–4.50)

## 2020-05-24 ENCOUNTER — Encounter: Payer: Self-pay | Admitting: Internal Medicine

## 2020-06-16 ENCOUNTER — Telehealth: Payer: Self-pay | Admitting: Neurology

## 2020-06-17 NOTE — Telephone Encounter (Signed)
Last seen by Dr Delice Lesch in 2018 refill denied

## 2020-06-29 DIAGNOSIS — H10413 Chronic giant papillary conjunctivitis, bilateral: Secondary | ICD-10-CM | POA: Diagnosis not present

## 2020-06-29 DIAGNOSIS — H5213 Myopia, bilateral: Secondary | ICD-10-CM | POA: Diagnosis not present

## 2020-06-29 DIAGNOSIS — H0102B Squamous blepharitis left eye, upper and lower eyelids: Secondary | ICD-10-CM | POA: Diagnosis not present

## 2020-06-29 DIAGNOSIS — H33321 Round hole, right eye: Secondary | ICD-10-CM | POA: Diagnosis not present

## 2020-06-29 DIAGNOSIS — H25813 Combined forms of age-related cataract, bilateral: Secondary | ICD-10-CM | POA: Diagnosis not present

## 2020-06-29 DIAGNOSIS — H0102A Squamous blepharitis right eye, upper and lower eyelids: Secondary | ICD-10-CM | POA: Diagnosis not present

## 2020-07-05 ENCOUNTER — Telehealth: Payer: Self-pay | Admitting: Pharmacist

## 2020-07-06 NOTE — Progress Notes (Signed)
° ° °  Chronic Care Management Pharmacy Assistant   Name: Charlene Patterson  MRN: [id number] DOB: 1945/08/16  Reason for Encounter: General Adherence Call   PCP : Charlene Grandchild, MD  Allergies:  No Known Allergies  Medications: Outpatient Encounter Medications as of 07/05/2020  Medication Sig   acetaminophen (TYLENOL) 650 MG CR tablet Take 650 mg by mouth every 8 (eight) hours as needed for pain.   atorvastatin (LIPITOR) 40 MG tablet TAKE 1 TABLET BY MOUTH DAILY.   Calcium-Magnesium-Vitamin D (CALCIUM 1200+D3 PO) Take 2 capsules by mouth daily.   cyanocobalamin 2000 MCG tablet Take 1 tablet (2,000 mcg total) by mouth daily.   DULoxetine (CYMBALTA) 30 MG capsule Take 1 capsule (30 mg total) by mouth daily.   Ergocalciferol (DRISDOL PO) Take 3,000 Units by mouth daily.   mirtazapine (REMERON) 7.5 MG tablet TAKE 1 TABLET AT BEDTIME   oxyCODONE (OXY IR/ROXICODONE) 5 MG immediate release tablet Take 1 tablet (5 mg total) by mouth every 6 (six) hours as needed for severe pain.   pantoprazole (PROTONIX) 40 MG tablet TAKE 1 TABLET BY MOUTH DAILY.   VASCEPA 1 g capsule TAKE 2 CAPSULES TWICE DAILY   No facility-administered encounter medications on file as of 07/05/2020.    Current Diagnosis: Patient Active Problem List   Diagnosis Date Noted   Spinal stenosis in cervical region 08/14/2019   Chronic renal disease, stage 3, moderately decreased glomerular filtration rate (GFR) between 30-59 mL/min/1.73 square meter (HCC) 02/05/2019   Other dietary vitamin B12 deficiency anemia 02/05/2019   Iron deficiency anemia due to chronic blood loss 07/30/2017   Hyperglycemia 07/30/2017   Episode of recurrent major depressive disorder (HCC) 01/05/2017   Routine general medical examination at a health care facility 02/08/2016   Hypertriglyceridemia 05/11/2015   Hyperlipidemia with target LDL less than 130 10/29/2014   Frequent PVCs 10/29/2014   Vitamin D deficiency 09/24/2014    Colitis 05/25/2014   Allergic rhinitis 05/25/2014   Spinal stenosis of lumbar region at multiple levels 05/25/2014   Thoracic compression fracture (HCC) 05/12/2014   Visit for screening mammogram 05/10/2014   GERD (gastroesophageal reflux disease) 12/13/2012   Osteoporosis 12/13/2012   Depression with somatization 12/13/2012    Goals Addressed   None     Follow-Up:  Pharmacist Review      A general adherence wellness call was made to Charlene Patterson to see how she has been doing since her last visit with the clinical pharmacist Charlene Patterson. The patient states that when she last saw Charlene Patterson she was having some pain in her back. She states that Charlene Patterson prescribed her oxycodone for the pain but she has yet to take it. She states that she does not want to get addicted to any pain medication, so she only takes if she cannot bare the pain. Also she said he prescribed Cymbalta which she has not started yet because she is still taking the citalopram. She states that if she starts to feel different that her family will notice and let her know but right now she is not as depressed or feeling down. I let the patient know that I will pass along the information to [place].  Charlene Patterson, Clinical Pharmacist Assistant [facility]

## 2020-07-07 NOTE — Telephone Encounter (Addendum)
Patient was prescribed duloxetine to replace citalopram in November but never switched. It sounds like she was not aware that duloxetine helps with pain and this is why the switch was made.  Called patient to discuss, she agreed she will stop citalopram today and start duloxetine tomorrow.

## 2020-08-19 ENCOUNTER — Other Ambulatory Visit: Payer: Self-pay | Admitting: Internal Medicine

## 2020-08-19 DIAGNOSIS — F329 Major depressive disorder, single episode, unspecified: Secondary | ICD-10-CM

## 2020-08-19 DIAGNOSIS — K219 Gastro-esophageal reflux disease without esophagitis: Secondary | ICD-10-CM

## 2020-08-19 DIAGNOSIS — M159 Polyosteoarthritis, unspecified: Secondary | ICD-10-CM

## 2020-08-19 DIAGNOSIS — E785 Hyperlipidemia, unspecified: Secondary | ICD-10-CM

## 2020-08-19 DIAGNOSIS — M8949 Other hypertrophic osteoarthropathy, multiple sites: Secondary | ICD-10-CM

## 2020-08-19 DIAGNOSIS — F32A Depression, unspecified: Secondary | ICD-10-CM

## 2020-09-16 ENCOUNTER — Telehealth: Payer: Self-pay | Admitting: Pharmacist

## 2020-09-20 NOTE — Progress Notes (Addendum)
° ° °  Chronic Care Management Pharmacy Assistant   Name: MARLI DIEGO  MRN: [id number] DOB: May 25, 1946   Reason for Encounter: Disease State   Conditions to be addressed/monitored: [place] [facility] visits:  None in previous 6 months  Medications: Outpatient Encounter Medications as of 09/16/2020  Medication Sig   acetaminophen (TYLENOL) 650 MG CR tablet Take 650 mg by mouth every 8 (eight) hours as needed for pain.   atorvastatin (LIPITOR) 40 MG tablet TAKE 1 TABLET EVERY DAY   Calcium-Magnesium-Vitamin D (CALCIUM 1200+D3 PO) Take 2 capsules by mouth daily.   cyanocobalamin 2000 MCG tablet Take 1 tablet (2,000 mcg total) by mouth daily.   DULoxetine (CYMBALTA) 30 MG capsule TAKE 1 CAPSULE EVERY DAY   Ergocalciferol (DRISDOL PO) Take 3,000 Units by mouth daily.   mirtazapine (REMERON) 7.5 MG tablet TAKE 1 TABLET AT BEDTIME   oxyCODONE (OXY IR/ROXICODONE) 5 MG immediate release tablet Take 1 tablet (5 mg total) by mouth every 6 (six) hours as needed for severe pain.   pantoprazole (PROTONIX) 40 MG tablet TAKE 1 TABLET EVERY DAY   VASCEPA 1 g capsule TAKE 2 CAPSULES TWICE DAILY   No facility-administered encounter medications on file as of 09/16/2020.     Star Rating Drugs: NO ACE/ARB  09/20/2020 Name: ALLYN BARTELSON MRN: [id number] DOB: 07-31-1945 Charlene Patterson is a 76 y.o. year old female who is a primary care patient of Janith Lima, MD.  Comprehensive medication review performed; Spoke to patient regarding cholesterol  Lipid Panel    Component Value Date/Time   CHOL 165 05/20/2020 1628   TRIG (H) 05/20/2020 1628    435.0 Triglyceride is over 400; calculations on Lipids are invalid.   HDL 28.50 (L) 05/20/2020 1628   LDLDIRECT 56.0 05/20/2020 1628    10-year ASCVD risk score: The 10-year ASCVD risk score Mikey Bussing DC Jr., et al., 2013) is: 12.8%   Values used to calculate the score:     Age: 59 years     Sex: Female     Is Non-Hispanic African American: No     Diabetic:  No     Tobacco smoker: No     Systolic Blood Pressure: 660 mmHg     Is BP treated: No     HDL Cholesterol: 28.5 mg/dL     Total Cholesterol: 165 mg/dL  Current antihyperlipidemic regimen: The patient is taking Atorvastatin 40 mg daily and Vascepa 1 gram 2 caps twice daily  Previous antihyperlipidemic medications tried: None ID  ASCVD risk enhancing conditions: Already Calculated  What recent interventions/DTPs have been made by any provider to improve Cholesterol control since last CPP Visit: The patient is to continue taking medications  Any recent hospitalizations or ED visits since last visit with CPP? The patient seen at the ED on 04/28/20 for arm pain  What diet changes have been made to improve Cholesterol? The patient states that she is watching what she eats and notices that she doesn't have much of appetite lately  What exercise is being done to improve Cholesterol? The patient states that she works out in her flower garden most days   Adherence Review: Does the patient have >5 day gap between last estimated fill dates?  Atorvastatin last filled 06/08/20 - 90 day supply. Refilled to [facility] 08/24/20.  Wendy Poet, [place] [phone]

## 2020-10-11 ENCOUNTER — Telehealth: Payer: Medicare HMO

## 2020-11-12 ENCOUNTER — Other Ambulatory Visit: Payer: Self-pay | Admitting: Internal Medicine

## 2020-11-12 DIAGNOSIS — M159 Polyosteoarthritis, unspecified: Secondary | ICD-10-CM

## 2020-11-12 DIAGNOSIS — F32A Depression, unspecified: Secondary | ICD-10-CM

## 2020-11-17 ENCOUNTER — Telehealth: Payer: Self-pay | Admitting: Pharmacist

## 2020-11-19 NOTE — Progress Notes (Signed)
    Chronic Care Management Pharmacy Assistant   Name: Charlene Patterson  MRN: [id number] DOB: January 02, 1946    Reason for Encounter: Disease State Hyperlipidemia Call   Conditions to be addressed/monitored: HLD   Recent office visits:  None ID  Recent consult visits:  None ID  Hospital visits:  None in previous 6 months  Medications: Outpatient Encounter Medications as of 11/17/2020  Medication Sig  . acetaminophen (TYLENOL) 650 MG CR tablet Take 650 mg by mouth every 8 (eight) hours as needed for pain.  Marland Kitchen atorvastatin (LIPITOR) 40 MG tablet TAKE 1 TABLET EVERY DAY  . Calcium-Magnesium-Vitamin D (CALCIUM 1200+D3 PO) Take 2 capsules by mouth daily.  . cyanocobalamin 2000 MCG tablet Take 1 tablet (2,000 mcg total) by mouth daily.  . DULoxetine (CYMBALTA) 30 MG capsule TAKE 1 CAPSULE EVERY DAY  . Ergocalciferol (DRISDOL PO) Take 3,000 Units by mouth daily.  . mirtazapine (REMERON) 7.5 MG tablet TAKE 1 TABLET AT BEDTIME  . oxyCODONE (OXY IR/ROXICODONE) 5 MG immediate release tablet Take 1 tablet (5 mg total) by mouth every 6 (six) hours as needed for severe pain.  . pantoprazole (PROTONIX) 40 MG tablet TAKE 1 TABLET EVERY DAY  . VASCEPA 1 g capsule TAKE 2 CAPSULES TWICE DAILY   No facility-administered encounter medications on file as of 11/17/2020.    11/19/2020 Name: Charlene Patterson MRN: [id number] DOB: Nov 02, 1945 [name] is a 75 y.o. year old female who is a primary care patient of Janith Lima, MD.  Comprehensive medication review performed; Spoke to patient regarding cholesterol  Lipid Panel    Component Value Date/Time   CHOL 165 05/20/2020 1628   TRIG (H) 05/20/2020 1628    435.0 Triglyceride is over 400; calculations on Lipids are invalid.   HDL 28.50 (L) 05/20/2020 1628   LDLDIRECT 56.0 05/20/2020 1628    10-year ASCVD risk score: The 10-year ASCVD risk score Mikey Bussing DC Brooke Bonito., et al., 2013) is: 13.9%   Values used to calculate the score:     Age: 65 years      Sex: Female     Is Non-Hispanic African American: No     Diabetic: No     Tobacco smoker: No     Systolic Blood Pressure: 782 mmHg     Is BP treated: No     HDL Cholesterol: 28.5 mg/dL     Total Cholesterol: 165 mg/dL  . Current antihyperlipidemic regimen:  Atorvastatin 40 mg daily  Vascepa 1 g 2 caps BID  . Previous antihyperlipidemic medications tried: None ID  . ASCVD risk enhancing conditions: age >7   . What recent interventions/DTPs have been made by any provider to improve Cholesterol control since last CPP Visit: None ID  . Any recent hospitalizations or ED visits since last visit with CPP? No   . What diet changes have been made to improve Cholesterol?  Patient states that she has not made any changes to her diet. She did say that since she has been taking the duloxetine that she does not have an appetite and has loss some weight  . What exercise is being done to improve Cholesterol?  The patient states that she is very active at home doing yard work, in her flower garden.  Adherence Review: Does the patient have >5 day gap between last estimated fill dates? No  Star Rating Drugs: Atorvastatin 08/23/20 90 ds  Ethelene Hal Clinical Pharmacist Assistant [phone]  Time spent:47

## 2020-11-22 ENCOUNTER — Telehealth: Payer: Self-pay | Admitting: Internal Medicine

## 2020-11-22 NOTE — Telephone Encounter (Signed)
LVM for pt to rtn my call to schedule AWV with [facility]. Please schedule AWV if pt calls the office  

## 2020-11-23 ENCOUNTER — Telehealth: Payer: Self-pay | Admitting: Internal Medicine

## 2020-11-23 NOTE — Telephone Encounter (Signed)
LVM for pt to rtn my call to schedule AWV with [facility]. Please schedule AWV if pt calls the office  

## 2020-12-07 ENCOUNTER — Telehealth: Payer: Self-pay | Admitting: Pharmacist

## 2020-12-07 NOTE — Progress Notes (Signed)
    Chronic Care Management Pharmacy Assistant   Name: Charlene Patterson  MRN: [id number] DOB: 1945/10/22   Reason for Encounter: Disease State Hyperlipidemia    Conditions to be addressed/monitored: HLD   Recent office visits:  None ID  Recent consult visits:  None ID  Hospital visits:  None in previous 6 months  Medications: Outpatient Encounter Medications as of 12/07/2020  Medication Sig  . acetaminophen (TYLENOL) 650 MG CR tablet Take 650 mg by mouth every 8 (eight) hours as needed for pain.  Marland Kitchen atorvastatin (LIPITOR) 40 MG tablet TAKE 1 TABLET EVERY DAY  . Calcium-Magnesium-Vitamin D (CALCIUM 1200+D3 PO) Take 2 capsules by mouth daily.  . cyanocobalamin 2000 MCG tablet Take 1 tablet (2,000 mcg total) by mouth daily.  . DULoxetine (CYMBALTA) 30 MG capsule TAKE 1 CAPSULE EVERY DAY  . Ergocalciferol (DRISDOL PO) Take 3,000 Units by mouth daily.  . mirtazapine (REMERON) 7.5 MG tablet TAKE 1 TABLET AT BEDTIME  . oxyCODONE (OXY IR/ROXICODONE) 5 MG immediate release tablet Take 1 tablet (5 mg total) by mouth every 6 (six) hours as needed for severe pain.  . pantoprazole (PROTONIX) 40 MG tablet TAKE 1 TABLET EVERY DAY  . VASCEPA 1 g capsule TAKE 2 CAPSULES TWICE DAILY   No facility-administered encounter medications on file as of 12/07/2020.   Pharmacist Review  12/07/2020 Name: GENOWEFA MORGA MRN: [id number] DOB: January 10, 1946 Charlene Patterson is a 75 y.o. year old female who is a primary care patient of Janith Lima, MD.  Comprehensive medication review performed; Spoke to patient regarding cholesterol  Lipid Panel    Component Value Date/Time   CHOL 165 05/20/2020 1628   TRIG (H) 05/20/2020 1628    435.0 Triglyceride is over 400; calculations on Lipids are invalid.   HDL 28.50 (L) 05/20/2020 1628   LDLDIRECT 56.0 05/20/2020 1628    10-year ASCVD risk score: The 10-year ASCVD risk score Mikey Bussing DC Brooke Bonito., et al., 2013) is: 13.9%   Values used to calculate the score:     Age: 4  years     Sex: Female     Is Non-Hispanic African American: No     Diabetic: No     Tobacco smoker: No     Systolic Blood Pressure: 606 mmHg     Is BP treated: No     HDL Cholesterol: 28.5 mg/dL     Total Cholesterol: 165 mg/dL  . Current antihyperlipidemic regimen:  Atorvastatin 40 mg daily Vascepa 1 g 2 caps bid  . Previous antihyperlipidemic medications tried: None ID . ASCVD risk enhancing conditions: age >5 . What recent interventions/DTPs have been made by any provider to improve Cholesterol control since last CPP Visit: None ID . Any recent hospitalizations or ED visits since last visit with CPP? No . What diet changes have been made to improve Cholesterol?  Patient states that she has not made any changes to diet  . What exercise is being done to improve Cholesterol?  Patient states that she stays very active at home. Stated right now she was at the beach fishing  Adherence Review: Does the patient have >5 day gap between last estimated fill dates? No  Star Rating Drugs: Atorvastatin 08/23/20 90 ds  [place] Pharmacist Assistant [phone]  Time spent:27

## 2020-12-20 ENCOUNTER — Other Ambulatory Visit: Payer: Self-pay

## 2020-12-20 ENCOUNTER — Ambulatory Visit (INDEPENDENT_AMBULATORY_CARE_PROVIDER_SITE_OTHER): Payer: Medicare HMO | Admitting: Internal Medicine

## 2020-12-20 ENCOUNTER — Encounter: Payer: Self-pay | Admitting: Internal Medicine

## 2020-12-20 VITALS — BP 138/82 | HR 68 | Temp 98.5°F | Resp 16 | Ht 64.0 in | Wt 131.0 lb

## 2020-12-20 DIAGNOSIS — E781 Pure hyperglyceridemia: Secondary | ICD-10-CM | POA: Diagnosis not present

## 2020-12-20 DIAGNOSIS — N1831 Chronic kidney disease, stage 3a: Secondary | ICD-10-CM | POA: Diagnosis not present

## 2020-12-20 DIAGNOSIS — M48061 Spinal stenosis, lumbar region without neurogenic claudication: Secondary | ICD-10-CM

## 2020-12-20 DIAGNOSIS — D513 Other dietary vitamin B12 deficiency anemia: Secondary | ICD-10-CM | POA: Diagnosis not present

## 2020-12-20 DIAGNOSIS — D500 Iron deficiency anemia secondary to blood loss (chronic): Secondary | ICD-10-CM | POA: Diagnosis not present

## 2020-12-20 LAB — URINALYSIS, ROUTINE W REFLEX MICROSCOPIC
Bilirubin Urine: NEGATIVE
Hgb urine dipstick: NEGATIVE
Ketones, ur: NEGATIVE
Leukocytes,Ua: NEGATIVE
Nitrite: NEGATIVE
RBC / HPF: NONE SEEN (ref 0–?)
Specific Gravity, Urine: <=1.005 — AB (ref 1.000–1.030)
Total Protein, Urine: NEGATIVE
Urine Glucose: NEGATIVE
Urobilinogen, UA: 0.2 (ref 0.0–1.0)
WBC, UA: NONE SEEN (ref 0–?)
pH: 6 (ref 5.0–8.0)

## 2020-12-20 LAB — TRIGLYCERIDES: Triglycerides: 293 mg/dL — ABNORMAL HIGH (ref 0.0–149.0)

## 2020-12-20 LAB — BASIC METABOLIC PANEL
BUN: 13 mg/dL (ref 6–23)
CO2: 27 mEq/L (ref 19–32)
Calcium: 9.6 mg/dL (ref 8.4–10.5)
Chloride: 106 mEq/L (ref 96–112)
Creatinine, Ser: 0.97 mg/dL (ref 0.40–1.20)
GFR: 57.67 mL/min — ABNORMAL LOW (ref >60.00)
Glucose, Bld: 79 mg/dL (ref 70–99)
Potassium: 3.8 mEq/L (ref 3.5–5.1)
Sodium: 141 mEq/L (ref 135–145)

## 2020-12-20 LAB — CBC WITH DIFFERENTIAL/PLATELET
Basophils Absolute: 0 10*3/uL (ref 0.0–0.1)
Basophils Relative: 0.3 % (ref 0.0–3.0)
Eosinophils Absolute: 0 10*3/uL (ref 0.0–0.7)
Eosinophils Relative: 0.9 % (ref 0.0–5.0)
HCT: 37.8 % (ref 36.0–46.0)
Hemoglobin: 13 g/dL (ref 12.0–15.0)
Lymphocytes Relative: 31.3 % (ref 12.0–46.0)
Lymphs Abs: 1.6 10*3/uL (ref 0.7–4.0)
MCHC: 34.3 g/dL (ref 30.0–36.0)
MCV: 90.6 fl (ref 78.0–100.0)
Monocytes Absolute: 0.3 10*3/uL (ref 0.1–1.0)
Monocytes Relative: 5.8 % (ref 3.0–12.0)
Neutro Abs: 3.2 10*3/uL (ref 1.4–7.7)
Neutrophils Relative %: 61.7 % (ref 43.0–77.0)
Platelets: 267 10*3/uL (ref 150.0–400.0)
RBC: 4.17 Mil/uL (ref 3.87–5.11)
RDW: 13.9 % (ref 11.5–15.5)
WBC: 5.2 10*3/uL (ref 4.0–10.5)

## 2020-12-20 LAB — IRON: Iron: 76 ug/dL (ref 42–145)

## 2020-12-20 LAB — FERRITIN: Ferritin: 26.5 ng/mL (ref 10.0–291.0)

## 2020-12-20 NOTE — Progress Notes (Signed)
Subjective:  Patient ID: Charlene Patterson, female    DOB: 07/31/1945  Age: 74 y.o. MRN: [id number]  CC: Anemia and Back Pain  This visit occurred during the SARS-CoV-2 public health emergency.  Safety protocols were in place, including screening questions prior to the visit, additional usage of staff PPE, and extensive cleaning of exam room while observing appropriate contact time as indicated for disinfecting solutions.    HPI Charlene Patterson presents for f/up -   She continues to complain of low back pain that radiates into her lower extremities.  She would like to see a neurosurgeon.  She has chronic burning in both feet but denies lower extremity weakness, numbness, or tingling.  She complains of chronic fatigue and forgetfulness but is active and denies any recent episodes of chest pain, shortness of breath, diaphoresis, dizziness, or lightheadedness.  Outpatient Medications Prior to Visit  Medication Sig Dispense Refill   acetaminophen (TYLENOL) 650 MG CR tablet Take 650 mg by mouth every 8 (eight) hours as needed for pain.     atorvastatin (LIPITOR) 40 MG tablet TAKE 1 TABLET EVERY DAY 90 tablet 1   Calcium-Magnesium-Vitamin D (CALCIUM 1200+D3 PO) Take 2 capsules by mouth daily.     cyanocobalamin 1000 MCG tablet Take 1,000 mcg by mouth daily.     DULoxetine (CYMBALTA) 30 MG capsule TAKE 1 CAPSULE EVERY DAY 90 capsule 0   Ergocalciferol (DRISDOL PO) Take 3,000 Units by mouth daily.     mirtazapine (REMERON) 7.5 MG tablet TAKE 1 TABLET AT BEDTIME 90 tablet 0   oxyCODONE (OXY IR/ROXICODONE) 5 MG immediate release tablet Take 1 tablet (5 mg total) by mouth every 6 (six) hours as needed for severe pain. 75 tablet 0   pantoprazole (PROTONIX) 40 MG tablet TAKE 1 TABLET EVERY DAY 90 tablet 1   Potassium 99 MG TABS Take by mouth.     VASCEPA 1 g capsule TAKE 2 CAPSULES TWICE DAILY 360 capsule 1   Zinc 50 MG TABS Take by mouth.     cyanocobalamin 2000 MCG tablet Take 1 tablet (2,000 mcg total)  by mouth daily. 90 tablet 1   No facility-administered medications prior to visit.    ROS Review of Systems  Constitutional:  Positive for fatigue. Negative for diaphoresis and unexpected weight change.  HENT: Negative.    Eyes: Negative.   Respiratory:  Negative for cough, chest tightness, shortness of breath and wheezing.   Gastrointestinal:  Negative for abdominal pain, blood in stool, constipation and vomiting.  Endocrine: Negative.   Genitourinary: Negative.  Negative for difficulty urinating.  Musculoskeletal:  Positive for back pain. Negative for myalgias and neck pain.  Skin: Negative.   Neurological: Negative.  Negative for dizziness, weakness, light-headedness, numbness and headaches.  Hematological:  Negative for adenopathy. Does not bruise/bleed easily.  Psychiatric/Behavioral: Negative.     Objective:  BP 138/82 (BP Location: Right Arm, Patient Position: Sitting, Cuff Size: Large)   Pulse 68   Temp 98.5 F (36.9 C) (Oral)   Resp 16   Ht 5\' 4"  (1.626 m)   Wt 131 lb (59.4 kg)   SpO2 98%   BMI 22.49 kg/m   BP Readings from Last 3 Encounters:  12/20/20 138/82  05/20/20 126/78  05/03/20 122/80    Wt Readings from Last 3 Encounters:  12/20/20 131 lb (59.4 kg)  05/20/20 140 lb (63.5 kg)  05/03/20 137 lb (62.1 kg)    Physical Exam Vitals reviewed.  HENT:  Nose: Nose normal.     Mouth/Throat:     Mouth: Mucous membranes are moist.  Eyes:     General: No scleral icterus.    Conjunctiva/sclera: Conjunctivae normal.  Cardiovascular:     Rate and Rhythm: Normal rate and regular rhythm.     Heart sounds: No murmur heard. Pulmonary:     Effort: Pulmonary effort is normal.     Breath sounds: No stridor. No wheezing, rhonchi or rales.  Abdominal:     General: Abdomen is flat. Bowel sounds are normal. There is no distension.     Palpations: Abdomen is soft. There is no hepatomegaly, splenomegaly or mass.  Musculoskeletal:        General: Normal range of  motion.     Cervical back: Neck supple.     Lumbar back: Normal. Negative right straight leg raise test and negative left straight leg raise test.  Lymphadenopathy:     Cervical: No cervical adenopathy.  Skin:    General: Skin is warm and dry.     Findings: No lesion.  Neurological:     General: No focal deficit present.     Mental Status: She is alert and oriented to person, place, and time. Mental status is at baseline.     Deep Tendon Reflexes: Reflexes normal.  Psychiatric:        Mood and Affect: Mood normal.        Behavior: Behavior normal.    Lab Results  Component Value Date   WBC 5.2 12/20/2020   HGB 13.0 12/20/2020   HCT 37.8 12/20/2020   PLT 267.0 12/20/2020   GLUCOSE 79 12/20/2020   CHOL 165 05/20/2020   TRIG 293.0 (H) 12/20/2020   HDL 28.50 (L) 05/20/2020   LDLDIRECT 56.0 05/20/2020   ALT 13 05/20/2020   AST 20 05/20/2020   NA 141 12/20/2020   K 3.8 12/20/2020   CL 106 12/20/2020   CREATININE 0.97 12/20/2020   BUN 13 12/20/2020   CO2 27 12/20/2020   TSH 2.97 05/20/2020   HGBA1C 5.6 07/30/2017    DG Forearm Right  Result Date: 04/28/2020 CLINICAL DATA:  Acute right arm pain after fall last week. EXAM: RIGHT FOREARM - 2 VIEW COMPARISON:  None. FINDINGS: There is no evidence of fracture or other focal bone lesions. Soft tissues are unremarkable. IMPRESSION: Negative. Electronically Signed   By: Marijo Conception M.D.   On: 04/28/2020 16:42    Assessment & Plan:   Charlene Patterson was seen today for anemia and back pain.  Diagnoses and all orders for this visit:  Iron deficiency anemia due to chronic blood loss- Her H/H are normal now. -     CBC with Differential/Platelet; Future -     Iron; Future -     Ferritin; Future -     Ferritin -     Iron -     CBC with Differential/Platelet  Other dietary vitamin B12 deficiency anemia  Hypertriglyceridemia- Her triglycerides not high enough to need to be treated. -     Triglycerides; Future -      Triglycerides  Stage 3a chronic kidney disease ([place])- Her renal function is stable.  Her blood pressure is adequately well controlled.  She will avoid nephrotoxic agents. -     Basic metabolic panel; Future -     Urinalysis, Routine w reflex microscopic; Future -     Urinalysis, Routine w reflex microscopic -     Basic metabolic panel  Spinal stenosis of  lumbar region at multiple levels -     Ambulatory referral to Neurosurgery  I am having Charlene Patterson maintain her Calcium-Magnesium-Vitamin D (CALCIUM 1200+D3 PO), acetaminophen, Ergocalciferol (DRISDOL PO), Vascepa, oxyCODONE, atorvastatin, pantoprazole, mirtazapine, DULoxetine, cyanocobalamin, Potassium, and Zinc.  No orders of the defined types were placed in this encounter.    Follow-up: Return in about 6 months (around 06/21/2021).  Charlene Calico, MD

## 2020-12-20 NOTE — Patient Instructions (Signed)
[employer] medicine (25th ed., pp. 1059-1068). [place], [place]: Elsevier.">  Anemia  Anemia is a condition in which there is not enough red blood cells or hemoglobin in the blood. Hemoglobin is a substance in red blood cells thatcarries oxygen. When you do not have enough red blood cells or hemoglobin (are anemic), your body cannot get enough oxygen and your organs may not work properly. Asa result, you may feel very tired or have other problems. What are the causes? Common causes of anemia include: Excessive bleeding. Anemia can be caused by excessive bleeding inside or outside the body, including bleeding from the intestines or from heavy menstrual periods in females. Poor nutrition. Long-lasting (chronic) kidney, thyroid, and liver disease. Bone marrow disorders, spleen problems, and blood disorders. Cancer and treatments for cancer. HIV (human immunodeficiency virus) and AIDS (acquired immunodeficiency syndrome). Infections, medicines, and autoimmune disorders that destroy red blood cells. What are the signs or symptoms? Symptoms of this condition include: Minor weakness. Dizziness. Headache, or difficulties concentrating and sleeping. Heartbeats that feel irregular or faster than normal (palpitations). Shortness of breath, especially with exercise. Pale skin, lips, and nails, or cold hands and feet. Indigestion and nausea. Symptoms may occur suddenly or develop slowly. If your anemia is mild, you maynot have symptoms. How is this diagnosed? This condition is diagnosed based on blood tests, your medical history, and a physical exam. In some cases, a test may be needed in which cells are removed from the soft tissue inside of a bone and looked at under a microscope (bone marrow biopsy). Your health care provider may also check your stool (feces) for blood and may do additional testing to look for the cause of yourbleeding. Other tests may include: Imaging tests, such as a CT scan or  MRI. A procedure to see inside your esophagus and stomach (endoscopy). A procedure to see inside your colon and rectum (colonoscopy). How is this treated? Treatment for this condition depends on the cause. If you continue to lose a lot of blood, you may need to be treated at a hospital. Treatment may include: Taking supplements of iron, vitamin B12, or folic acid. Taking a hormone medicine (erythropoietin) that can help to stimulate red blood cell growth. Having a blood transfusion. This may be needed if you lose a lot of blood. Making changes to your diet. Having surgery to remove your spleen. Follow these instructions at home: Take over-the-counter and prescription medicines only as told by your health care provider. Take supplements only as told by your health care provider. Follow any diet instructions that you were given by your health care provider. Keep all follow-up visits as told by your health care provider. This is important. Contact a health care provider if: You develop new bleeding anywhere in the body. Get help right away if: You are very weak. You are short of breath. You have pain in your abdomen or chest. You are dizzy or feel faint. You have trouble concentrating. You have bloody stools, black stools, or tarry stools. You vomit repeatedly or you vomit up blood. These symptoms may represent a serious problem that is an emergency. Do not wait to see if the symptoms will go away. Get medical help right away. Call your local emergency services (911 in the [place] Do not drive yourself to the hospital. Summary Anemia is a condition in which you do not have enough red blood cells or enough of a substance in your red blood cells that carries oxygen (hemoglobin). Symptoms may occur suddenly   or develop slowly. If your anemia is mild, you may not have symptoms. This condition is diagnosed with blood tests, a medical history, and a physical exam. Other tests may be  needed. Treatment for this condition depends on the cause of the anemia. This information is not intended to replace advice given to you by your health care provider. Make sure you discuss any questions you have with your healthcare provider. Document Revised: 05/27/2019 Document Reviewed: 05/27/2019 Elsevier Patient Education  2022 [employer].

## 2020-12-25 ENCOUNTER — Encounter: Payer: Self-pay | Admitting: Internal Medicine

## 2021-02-03 ENCOUNTER — Other Ambulatory Visit: Payer: Self-pay | Admitting: Internal Medicine

## 2021-02-03 DIAGNOSIS — E785 Hyperlipidemia, unspecified: Secondary | ICD-10-CM

## 2021-02-03 DIAGNOSIS — K219 Gastro-esophageal reflux disease without esophagitis: Secondary | ICD-10-CM

## 2021-02-24 ENCOUNTER — Telehealth: Payer: Medicare HMO

## 2021-02-24 NOTE — Progress Notes (Deleted)
Chronic Care Management Pharmacy Note  02/24/2021 Name:  Charlene Patterson MRN:  [id number] DOB:  01-05-1946  Summary: ***  Recommendations/Changes made from today's visit: ***  Plan: ***   Subjective: Charlene Patterson is an 75 y.o. year old female who is a primary patient of Janith Lima, MD.  The CCM team was consulted for assistance with disease management and care coordination needs.    {CCMTELEPHONEFACETOFACE:21091510} for {CCMINITIALFOLLOWUPCHOICE:21091511} in response to provider referral for pharmacy case management and/or care coordination services.   Consent to Services:  {CCMCONSENTOPTIONS:25074}  Patient Care Team: Janith Lima, MD as PCP - General (Internal Medicine) Charlton Haws, [facility] (Pharmacist)  Recent office visits: ***  Recent consult visits: [facility] visits: {Hospital DC Yes/No:25215}   Objective:  Lab Results  Component Value Date   CREATININE 0.97 12/20/2020   BUN 13 12/20/2020   GFR 57.67 (L) 12/20/2020   GFRNONAA 62 (L) 05/27/2014   GFRAA 72 (L) 05/27/2014   NA 141 12/20/2020   K 3.8 12/20/2020   CALCIUM 9.6 12/20/2020   CO2 27 12/20/2020   GLUCOSE 79 12/20/2020    Lab Results  Component Value Date/Time   HGBA1C 5.6 07/30/2017 09:12 AM   GFR 57.67 (L) 12/20/2020 02:06 PM   GFR 60.91 05/20/2020 04:28 PM    Last diabetic Eye exam: No results found for: HMDIABEYEEXA  Last diabetic Foot exam: No results found for: HMDIABFOOTEX   Lab Results  Component Value Date   CHOL 165 05/20/2020   HDL 28.50 (L) 05/20/2020   LDLDIRECT 56.0 05/20/2020   TRIG 293.0 (H) 12/20/2020   CHOLHDL 6 05/20/2020    Hepatic Function Latest Ref Rng & Units 05/20/2020 02/05/2019 07/30/2017  Total Protein 6.0 - 8.3 g/dL 6.8 7.0 7.3  Albumin 3.5 - 5.2 g/dL 4.1 4.3 4.4  AST 0 - 37 U/L 20 13 17   ALT 0 - 35 U/L 13 9 15   Alk Phosphatase 39 - 117 U/L 114 131(H) 123(H)  Total Bilirubin 0.2 - 1.2 mg/dL 0.4 0.3 0.4  Bilirubin, Direct 0.0 - 0.3  mg/dL 0.1 0.0 -    Lab Results  Component Value Date/Time   TSH 2.97 05/20/2020 04:28 PM   TSH 2.61 02/05/2019 11:38 AM    CBC Latest Ref Rng & Units 12/20/2020 05/20/2020 08/14/2019  WBC 4.0 - 10.5 K/uL 5.2 4.3 4.2  Hemoglobin 12.0 - 15.0 g/dL 13.0 12.5 13.4  Hematocrit 36.0 - 46.0 % 37.8 35.5(L) 39.8  Platelets 150.0 - 400.0 K/uL 267.0 327.0 258.0    Lab Results  Component Value Date/Time   VD25OH 32.12 05/20/2020 04:28 PM   VD25OH 30.11 08/14/2019 01:38 PM    Clinical ASCVD: {YES/NO:21197} The 10-year ASCVD risk score Mikey Bussing DC Jr., et al., 2013) is: 16.4%   Values used to calculate the score:     Age: 16 years     Sex: Female     Is Non-Hispanic African American: No     Diabetic: No     Tobacco smoker: No     Systolic Blood Pressure: 509 mmHg     Is BP treated: No     HDL Cholesterol: 28.5 mg/dL     Total Cholesterol: 165 mg/dL    Depression screen [facility] 2/9 12/20/2020 05/20/2020 10/13/2019  Decreased Interest 0 0 0  Down, Depressed, Hopeless 0 1 0  PHQ - 2 Score 0 1 0  Altered sleeping 0 0 -  Tired, decreased energy 0 0 -  Change in appetite  0 0 -  Feeling bad or failure about yourself  0 0 -  Trouble concentrating 0 0 -  Moving slowly or fidgety/restless 0 0 -  Suicidal thoughts 0 0 -  PHQ-9 Score 0 1 -  Difficult doing work/chores - - -  Some recent data might be hidden     ***Other: (CHADS2VASc if Afib, MMRC or CAT for COPD, ACT, DEXA)  Social History   Tobacco Use  Smoking Status Never  Smokeless Tobacco Never   BP Readings from Last 3 Encounters:  12/20/20 138/82  05/20/20 126/78  05/03/20 122/80   Pulse Readings from Last 3 Encounters:  12/20/20 68  05/20/20 70  04/28/20 70   Wt Readings from Last 3 Encounters:  12/20/20 131 lb (59.4 kg)  05/20/20 140 lb (63.5 kg)  05/03/20 137 lb (62.1 kg)   BMI Readings from Last 3 Encounters:  12/20/20 22.49 kg/m  05/20/20 24.03 kg/m  05/03/20 23.52 kg/m    Assessment/Interventions: Review of  patient past medical history, allergies, medications, health status, including review of consultants reports, laboratory and other test data, was performed as part of comprehensive evaluation and provision of chronic care management services.   SDOH:  (Social Determinants of Health) assessments and interventions performed: {yes/no:20286}  SDOH Screenings   Alcohol Screen: Not on file  Depression (PHQ2-9): Low Risk    PHQ-2 Score: 0  Financial Resource Strain: Not on file  Food Insecurity: Not on file  Housing: Not on file  Physical Activity: Not on file  Social Connections: Not on file  Stress: Not on file  Tobacco Use: Low Risk    Smoking Tobacco Use: Never   Smokeless Tobacco Use: Never  Transportation Needs: Not on file    [place]  No Known Allergies  Medications Reviewed Today     Reviewed by Janith Lima, MD (Physician) on 12/20/20 at [place] List Status: <None>   Medication Order Taking? Sig Documenting Provider Last Dose Status Informant  acetaminophen (TYLENOL) 650 MG CR tablet [id number] Yes Take 650 mg by mouth every 8 (eight) hours as needed for pain. [provider] Taking Active Self  atorvastatin (LIPITOR) 40 MG tablet [id number] Yes TAKE 1 TABLET EVERY DAY Janith Lima, MD Taking Active   Calcium-Magnesium-Vitamin D (CALCIUM 1200+D3 PO) [id number] Yes Take 2 capsules by mouth daily. [provider] Taking Active Self  cyanocobalamin 1000 MCG tablet [id number] Yes Take 1,000 mcg by mouth daily. [provider] Taking Active   DULoxetine (CYMBALTA) 30 MG capsule [id number] Yes TAKE 1 CAPSULE EVERY DAY Janith Lima, MD Taking Active   Ergocalciferol (DRISDOL PO) [id number] Yes Take 3,000 Units by mouth daily. [provider] Taking Active   mirtazapine (REMERON) 7.5 MG tablet [id number] Yes TAKE 1 TABLET AT BEDTIME Janith Lima, MD Taking Active   oxyCODONE (OXY IR/ROXICODONE) 5 MG immediate release tablet [id number] Yes  Take 1 tablet (5 mg total) by mouth every 6 (six) hours as needed for severe pain. Janith Lima, MD Taking Active   pantoprazole (PROTONIX) 40 MG tablet [id number] Yes TAKE 1 TABLET EVERY DAY Janith Lima, MD Taking Active   Potassium 99 MG TABS [id number] Yes Take by mouth. [provider] Taking Active   VASCEPA 1 g capsule [id number] Yes TAKE 2 CAPSULES TWICE DAILY Janith Lima, MD Taking Active   Zinc 50 MG TABS [id number] Yes Take by mouth. [provider] Taking Active  Patient Active Problem List   Diagnosis Date Noted   Spinal stenosis in cervical region 08/14/2019   Chronic renal disease, stage 3, moderately decreased glomerular filtration rate (GFR) between 30-59 mL/min/1.73 square meter (HCC) 02/05/2019   Other dietary vitamin B12 deficiency anemia 02/05/2019   Iron deficiency anemia due to chronic blood loss 07/30/2017   Episode of recurrent major depressive disorder ([place]) 01/05/2017   Routine general medical examination at a health care facility 02/08/2016   Hypertriglyceridemia 05/11/2015   Hyperlipidemia with target LDL less than 130 10/29/2014   Frequent PVCs 10/29/2014   Vitamin D deficiency 09/24/2014   Colitis 05/25/2014   Allergic rhinitis 05/25/2014   Spinal stenosis of lumbar region at multiple levels 05/25/2014   Thoracic compression fracture ([place]) 05/12/2014   Visit for screening mammogram 05/10/2014   GERD (gastroesophageal reflux disease) 12/13/2012   Osteoporosis 12/13/2012   Depression with somatization 12/13/2012    Immunization History  Administered Date(s) Administered   Fluad Quad(high Dose 65+) 08/14/2019   Influenza, High Dose Seasonal PF 07/30/2017, 09/02/2018   Influenza,inj,Quad PF,6+ Mos 04/14/2014, 05/11/2015   Influenza-Unspecified 04/02/2012   Pneumococcal Conjugate-13 05/11/2015   Pneumococcal Polysaccharide-23 12/13/2012, 07/30/2017   Tdap 12/13/2012    Conditions to be addressed/monitored:   {USCCMDZASSESSMENTOPTIONS:23563}  There are no care plans that you recently modified to display for this patient.    Medication Assistance: {MEDASSISTANCEINFO:25044}  Compliance/Adherence/Medication fill history: Care Gaps: ***  Star-Rating Drugs: ***  Patient's preferred pharmacy is:  [facility] - [address], [address] [place] [address] Phone: [phone] Fax: [phone]  Uses pill box? {Yes or If no, why not?:20788} Pt endorses ***% compliance  We discussed: {Pharmacy options:24294} Patient decided to: {US Pharmacy Plan:23885}  Care Plan and Follow Up Patient Decision:  {FOLLOWUP:24991}  Plan: {CM FOLLOW UP MDEK:06349}  ***    Current Barriers:  {pharmacybarriers:24917}  Pharmacist Clinical Goal(s):  Patient will {PHARMACYGOALCHOICES:24921} through collaboration with PharmD and provider.   Interventions: 1:1 collaboration with Janith Lima, MD regarding development and update of comprehensive plan of care as evidenced by provider attestation and co-signature Inter-disciplinary care team collaboration (see longitudinal plan of care) Comprehensive medication review performed; medication list updated in electronic medical record  {CCM PHARMD DISEASE STATES:25130}  Patient Goals/Self-Care Activities Patient will:  - {pharmacypatientgoals:24919}

## 2021-03-11 ENCOUNTER — Emergency Department (HOSPITAL_COMMUNITY)
Admission: EM | Admit: 2021-03-11 | Discharge: 2021-03-11 | Disposition: A | Discharge status: Home / Self Care | Payer: Medicare HMO | Attending: Emergency Medicine | Admitting: Emergency Medicine

## 2021-03-11 ENCOUNTER — Other Ambulatory Visit: Payer: Self-pay

## 2021-03-11 ENCOUNTER — Telehealth: Payer: Self-pay | Admitting: Internal Medicine

## 2021-03-11 ENCOUNTER — Encounter: Payer: Self-pay | Admitting: Nurse Practitioner

## 2021-03-11 ENCOUNTER — Encounter (HOSPITAL_COMMUNITY): Payer: Self-pay

## 2021-03-11 DIAGNOSIS — N183 Chronic kidney disease, stage 3 unspecified: Secondary | ICD-10-CM | POA: Diagnosis not present

## 2021-03-11 DIAGNOSIS — K922 Gastrointestinal hemorrhage, unspecified: Secondary | ICD-10-CM | POA: Diagnosis not present

## 2021-03-11 DIAGNOSIS — K625 Hemorrhage of anus and rectum: Secondary | ICD-10-CM | POA: Diagnosis present

## 2021-03-11 DIAGNOSIS — D631 Anemia in chronic kidney disease: Secondary | ICD-10-CM | POA: Diagnosis not present

## 2021-03-11 LAB — COMPREHENSIVE METABOLIC PANEL
ALT: 16 U/L (ref 0–44)
AST: 17 U/L (ref 15–41)
Albumin: 3.9 g/dL (ref 3.5–5.0)
Alkaline Phosphatase: 131 U/L — ABNORMAL HIGH (ref 38–126)
Anion gap: 7 (ref 5–15)
BUN: 15 mg/dL (ref 8–23)
CO2: 27 mmol/L (ref 22–32)
Calcium: 9.7 mg/dL (ref 8.9–10.3)
Chloride: 105 mmol/L (ref 98–111)
Creatinine, Ser: 0.91 mg/dL (ref 0.44–1.00)
GFR, Estimated: >60 mL/min (ref >60)
Glucose, Bld: 94 mg/dL (ref 70–99)
Potassium: 3.8 mmol/L (ref 3.5–5.1)
Sodium: 139 mmol/L (ref 135–145)
Total Bilirubin: 0.7 mg/dL (ref 0.3–1.2)
Total Protein: 6.5 g/dL (ref 6.5–8.1)

## 2021-03-11 LAB — CBC
HCT: 38.6 % (ref 36.0–46.0)
Hemoglobin: 13.3 g/dL (ref 12.0–15.0)
MCH: 31.7 pg (ref 26.0–34.0)
MCHC: 34.5 g/dL (ref 30.0–36.0)
MCV: 92.1 fL (ref 80.0–100.0)
Platelets: 259 10*3/uL (ref 150–400)
RBC: 4.19 MIL/uL (ref 3.87–5.11)
RDW: 13.6 % (ref 11.5–15.5)
WBC: 3.7 10*3/uL — ABNORMAL LOW (ref 4.0–10.5)
nRBC: 0 % (ref 0.0–0.2)

## 2021-03-11 LAB — TYPE AND SCREEN
ABO/RH(D): B POS
Antibody Screen: NEGATIVE

## 2021-03-11 LAB — POC OCCULT BLOOD, ED: Fecal Occult Bld: POSITIVE — AB

## 2021-03-11 NOTE — ED Provider Notes (Signed)
[facility] EMERGENCY DEPARTMENT Provider Note   CSN: [id number] Arrival date & time: 03/11/21  1044     History Chief Complaint  Patient presents with   Rectal Bleeding    Charlene Patterson is a 75 y.o. female.  Patient presents with rectal bleeding.  Describes bright red bleeding intermittently when she has a bowel movement for the past 3 to 4 days.  However she states within the past day or 2 the bleeding has become much milder and only small amount was noted to last bowel movement she had.  Otherwise denies any abdominal pain.  No reports of fevers or cough or vomiting or diarrhea.  She states she had similar symptoms several years ago with colonoscopy that was unremarkable per patient.      Past Medical History:  Diagnosis Date   Allergy    Anxiety    Arthritis    "left foot" (05/26/2014)   Chronic back pain    Chronic neck pain    Depression    GERD (gastroesophageal reflux disease)    History of chicken pox    History of colon polyps    History of hiatal hernia    Lower GI bleeding    Migraines    "monthly usually; weekly lately" (05/26/2014)   Sciatica    Thoracic compression fracture [facility])     Patient Active Problem List   Diagnosis Date Noted   Spinal stenosis in cervical region 08/14/2019   Chronic renal disease, stage 3, moderately decreased glomerular filtration rate (GFR) between 30-59 mL/min/1.73 square meter ([place]) 02/05/2019   Other dietary vitamin B12 deficiency anemia 02/05/2019   Iron deficiency anemia due to chronic blood loss 07/30/2017   Episode of recurrent major depressive disorder ([place]) 01/05/2017   Routine general medical examination at a health care facility 02/08/2016   Hypertriglyceridemia 05/11/2015   Hyperlipidemia with target LDL less than 130 10/29/2014   Frequent PVCs 10/29/2014   Vitamin D deficiency 09/24/2014   Colitis 05/25/2014   Allergic rhinitis 05/25/2014   Spinal stenosis of lumbar region at multiple  levels 05/25/2014   Thoracic compression fracture ([place]) 05/12/2014   Visit for screening mammogram 05/10/2014   GERD (gastroesophageal reflux disease) 12/13/2012   Osteoporosis 12/13/2012   Depression with somatization 12/13/2012    Past Surgical History:  Procedure Laterality Date   ABDOMINAL HYSTERECTOMY  ~ 1978   "partial"   APPENDECTOMY  ~ 1978   COLONOSCOPY  2005   last 2005   POLYPECTOMY       OB History   No obstetric history on file.     Family History  Problem Relation Age of Onset   Heart disease Mother    Heart disease Father    Prostate cancer Father    Diabetes Sister    Stomach cancer Sister 50   Diabetes Brother    Heart disease Brother    Rectal cancer Neg Hx    Colon cancer Neg Hx     Social History   Tobacco Use   Smoking status: Never   Smokeless tobacco: Never  Vaping Use   Vaping Use: Never used  Substance Use Topics   Alcohol use: No    Alcohol/week: 0.0 standard drinks   Drug use: No    Home Medications Prior to Admission medications   Medication Sig Start Date End Date Taking? Authorizing Provider  acetaminophen (TYLENOL) 650 MG CR tablet Take 650 mg by mouth every 8 (eight) hours as needed for pain.  [provider]  atorvastatin (LIPITOR) 40 MG tablet TAKE 1 TABLET EVERY DAY 02/03/21   Janith Lima, MD  Calcium-Magnesium-Vitamin D (CALCIUM 1200+D3 PO) Take 2 capsules by mouth daily.    [provider]  cyanocobalamin 1000 MCG tablet Take 1,000 mcg by mouth daily.    [provider]  DULoxetine (CYMBALTA) 30 MG capsule TAKE 1 CAPSULE EVERY DAY 11/12/20   Janith Lima, MD  Ergocalciferol (DRISDOL PO) Take 3,000 Units by mouth daily.    [provider]  mirtazapine (REMERON) 7.5 MG tablet TAKE 1 TABLET AT BEDTIME 08/22/20   Janith Lima, MD  oxyCODONE (OXY IR/ROXICODONE) 5 MG immediate release tablet Take 1 tablet (5 mg total) by mouth every 6 (six) hours as needed for severe pain. 05/20/20    Janith Lima, MD  pantoprazole (PROTONIX) 40 MG tablet TAKE 1 TABLET EVERY DAY 02/03/21   Janith Lima, MD  Potassium 99 MG TABS Take by mouth.    [provider]  VASCEPA 1 g capsule TAKE 2 CAPSULES TWICE DAILY 03/18/20   Janith Lima, MD  Zinc 50 MG TABS Take by mouth.    [provider]    Allergies    Patient has no known allergies.  Review of Systems   Review of Systems  Constitutional:  Negative for fever.  HENT:  Negative for ear pain.   Eyes:  Negative for pain.  Respiratory:  Negative for cough.   Cardiovascular:  Negative for chest pain.  Gastrointestinal:  Negative for abdominal pain.  Genitourinary:  Negative for flank pain.  Musculoskeletal:  Negative for back pain.  Skin:  Negative for rash.  Neurological:  Negative for headaches.   Physical Exam Updated Vital Signs BP 116/73   Pulse 72   Temp 98.5 F (36.9 C) (Oral)   Resp 16   Ht '5\' 4"'$  (1.626 m)   Wt 55.3 kg   SpO2 98%   BMI 20.94 kg/m   Physical Exam Constitutional:      General: She is not in acute distress.    Appearance: Normal appearance.  HENT:     Head: Normocephalic.     Nose: Nose normal.  Eyes:     Extraocular Movements: Extraocular movements intact.  Cardiovascular:     Rate and Rhythm: Normal rate.  Pulmonary:     Effort: Pulmonary effort is normal.  Genitourinary:    Comments: Vital exam done with nursing chaperone, brown appearing stool with small flecks of blood clots noted.  This is guaiac positive. Musculoskeletal:        General: Normal range of motion.     Cervical back: Normal range of motion.  Neurological:     General: No focal deficit present.     Mental Status: She is alert. Mental status is at baseline.    ED Results / Procedures / Treatments   Labs (all labs ordered are listed, but only abnormal results are displayed) Labs Reviewed  COMPREHENSIVE METABOLIC PANEL - Abnormal; Notable for the following components:      Result Value    Alkaline Phosphatase 131 (*)    All other components within normal limits  CBC - Abnormal; Notable for the following components:   WBC 3.7 (*)    All other components within normal limits  POC OCCULT BLOOD, ED - Abnormal; Notable for the following components:   Fecal Occult Bld POSITIVE (*)    All other components within normal limits  TYPE AND SCREEN  EKG None  Radiology No results found.  Procedures Procedures   Medications Ordered in ED Medications - No data to display  ED Course  I have reviewed the triage vital signs and the nursing notes.  Pertinent labs & imaging results that were available during my care of the patient were reviewed by me and considered in my medical decision making (see chart for details).    MDM Rules/Calculators/A&P                           Labs within normal limits.  White count normal hemoglobin normal at 13.3 chemistry unremarkable.  Vital signs are stable and no significant anemia noted.  Rectal exam is relatively benign with scant amount of bleeding noted.  Will recommend outpatient follow-up with GI within the week.  Recommending immediate return for lightheadedness shortness of breath heavy bleeding or any additional concerns.  Otherwise follow-up with GI within the week.   Final Clinical Impression(s) / ED Diagnoses Final diagnoses:  Acute GI bleeding    Rx / DC Orders ED Discharge Orders     None        Luna Fuse, MD 03/11/21 1418

## 2021-03-11 NOTE — Telephone Encounter (Signed)
Noted  

## 2021-03-11 NOTE — Telephone Encounter (Signed)
   Patient calling to report blood in stool, several days, stomach pain/cramps   Call transferred to [facility] for triage

## 2021-03-11 NOTE — ED Triage Notes (Signed)
Pt reports bright red rectal bleeding since Sunday. Pt c.o mild abd pain.

## 2021-03-11 NOTE — Telephone Encounter (Signed)
FYI..   Caller states she had some cramping in abdomen on sunday with rectal bleeding that was off and on until monday at lunch. Now she is having some rectal bleeding but no cramping this am. She has been treated for this in the past with a hospitalization.   Advised to go to ED. Patient understood and agreed. Went to [facility]

## 2021-03-11 NOTE — Discharge Instructions (Addendum)
Your labs were normal appearing today.  Call the gastroenterologist as discussed in the next 2-3 days.   Return immediately back to the ER if:  Your symptoms worsen within the next 12-24 hours. You develop new symptoms such as new fevers, persistent vomiting, new pain, shortness of breath, or new weakness or numbness, or if you have any other concerns.

## 2021-04-07 ENCOUNTER — Other Ambulatory Visit: Payer: Self-pay | Admitting: Nurse Practitioner

## 2021-04-07 ENCOUNTER — Ambulatory Visit: Payer: Medicare HMO | Admitting: Nurse Practitioner

## 2021-04-07 ENCOUNTER — Other Ambulatory Visit (INDEPENDENT_AMBULATORY_CARE_PROVIDER_SITE_OTHER): Payer: Medicare HMO

## 2021-04-07 ENCOUNTER — Encounter: Payer: Self-pay | Admitting: Nurse Practitioner

## 2021-04-07 VITALS — BP 102/70 | HR 80 | Ht 62.75 in | Wt 125.1 lb

## 2021-04-07 DIAGNOSIS — R748 Abnormal levels of other serum enzymes: Secondary | ICD-10-CM

## 2021-04-07 DIAGNOSIS — K625 Hemorrhage of anus and rectum: Secondary | ICD-10-CM | POA: Diagnosis not present

## 2021-04-07 DIAGNOSIS — K219 Gastro-esophageal reflux disease without esophagitis: Secondary | ICD-10-CM

## 2021-04-07 LAB — CBC
HCT: 39.4 % (ref 36.0–46.0)
Hemoglobin: 13.5 g/dL (ref 12.0–15.0)
MCHC: 34.3 g/dL (ref 30.0–36.0)
MCV: 91 fl (ref 78.0–100.0)
Platelets: 273 10*3/uL (ref 150.0–400.0)
RBC: 4.33 Mil/uL (ref 3.87–5.11)
RDW: 14.2 % (ref 11.5–15.5)
WBC: 3.9 10*3/uL — ABNORMAL LOW (ref 4.0–10.5)

## 2021-04-07 LAB — HEPATIC FUNCTION PANEL
ALT: 14 U/L (ref 0–35)
AST: 15 U/L (ref 0–37)
Albumin: 4.5 g/dL (ref 3.5–5.2)
Alkaline Phosphatase: 153 U/L — ABNORMAL HIGH (ref 39–117)
Bilirubin, Direct: 0.1 mg/dL (ref 0.0–0.3)
Total Bilirubin: 0.5 mg/dL (ref 0.2–1.2)
Total Protein: 7 g/dL (ref 6.0–8.3)

## 2021-04-07 LAB — GAMMA GT: GGT: 21 U/L (ref 7–51)

## 2021-04-07 MED ORDER — FAMOTIDINE 20 MG PO TABS: 20.0000 mg | ORAL_TABLET | Freq: Every day | ORAL | 1 refills | Status: DC

## 2021-04-07 MED ORDER — SUPREP BOWEL PREP KIT 17.5-3.13-1.6 GM/177ML PO SOLN: 1.0000 | ORAL | 0 refills | Status: DC

## 2021-04-07 NOTE — Patient Instructions (Addendum)
PROCEDURES: You have been scheduled for an EGD and Colonoscopy. Please follow the written instructions given to you at your visit today. Please pick up your prep supplies at the pharmacy within the next 1-3 days. If you use inhalers (even only as needed), please bring them with you on the day of your procedure.  LABS:  Lab work has been ordered for you today. Our lab is located in the basement. Press "B" on the elevator. The lab is located at the first door on the left as you exit the elevator.  HEALTHCARE LAWS AND MY CHART RESULTS: Due to recent changes in healthcare laws, you may see the results of your imaging and laboratory studies on MyChart before your provider has had a chance to review them.   We understand that in some cases there may be results that are confusing or concerning to you. Not all laboratory results come back in the same time frame and the provider may be waiting for multiple results in order to interpret others.  Please give [place] 48 hours in order for your provider to thoroughly review all the results before contacting the office for clarification of your results.   RECOMMENDATIONS: Continue Pantoprazole 40 MG a day. Add Famotidine 20 MG at night, this has been sent to your pharmacy. Reduce Excedrin use. Please call our office if your symptoms worsen.  It was great seeing you today! Thank you for entrusting me with your care and choosing [facility].  Noralyn Pick, CRNP  The [facility] providers would like to encourage you to use [facility] to communicate with providers for non-urgent requests or questions.  Due to long hold times on the telephone, sending your provider a message by [facility] may be faster and more efficient way to get a response. Please allow 48 business hours for a response.  Please remember that this is for non-urgent requests/questions. If you are age 74 or older, your body mass index should be between 23-30. Your Body mass index is  22.34 kg/m. If this is out of the aforementioned range listed, please consider follow up with your Primary Care Provider.  If you are age 19 or younger, your body mass index should be between 19-25. Your Body mass index is 22.34 kg/m. If this is out of the aformentioned range listed, please consider follow up with your Primary Care Provider.

## 2021-04-07 NOTE — Progress Notes (Addendum)
04/07/2021 Charlene Patterson [id number] July 29, 1945   Chief Complaint:  Hospital follow up, rectal bleeding   History of Present Illness: Charlene Patterson. Charlene Patterson is a 75 year old female with a past medical history of anxiety, depression, chronic headaches, arthritis, hypercholesterolemia, GERD and acute colitis in 2015. She presents to our office today as recommended by Dr. Thamas Jaegers ED physician  for further evaluation regarding abdominal pain with bright red rectal  bleeding. She developed central abdominal pain and passed a brown BM with red blood 2 to 3 times for 3 to 4 days. Sometimes she passed only blood without stool. She presented to [facility] on 03/11/2021 for further evaluation. Labs in the ED showed a Hg level of 13.3 which is her baseline level. + FOBT. CTAP was not done. She was hemodynamically stable and she was discharged home with the instructions to follow up with GI within one week.  She denies having any further bright red rectal bleeding or central/lower abdominal pain since she was seen at the ED. She complains of having heartburn which is worse at night. She has vomited up acid secretions a few times at night. No hematemesis or coffee ground emesis. She reports passing black colored soft to loose stools for the past 4 to 5 weeks. She previously took oral iron as needed when she felt tired. She stopped taking oral iron 2 weeks ago and her stool color remains back. She is taking Pantoprazole 71m QD. She takes Excedrin 2 tabs 3 to 4 times most weeks due to having migraine headaches. She underwent an EGD at least 15 years ago which she reported showed a hiatal hernia. Sister with history of stomach cancer. Her most recent colonoscopy was 08/10/2014 which showed diverticulosis, no polyps. She reports losing 10lbs over the past year. No fevers.     CBC Latest Ref Rng & Units 03/11/2021 12/20/2020 05/20/2020  WBC 4.0 - 10.5 K/uL 3.7(L) 5.2 4.3  Hemoglobin 12.0 - 15.0 g/dL 13.3 13.0  12.5  Hematocrit 36.0 - 46.0 % 38.6 37.8 35.5(L)  Platelets 150 - 400 K/uL 259 267.0 327.0    CMP Latest Ref Rng & Units 03/11/2021 12/20/2020 05/20/2020  Glucose 70 - 99 mg/dL 94 79 70  BUN 8 - 23 mg/dL 15 13 17   Creatinine 0.44 - 1.00 mg/dL 0.91 0.97 0.93  Sodium 135 - 145 mmol/L 139 141 143  Potassium 3.5 - 5.1 mmol/L 3.8 3.8 3.5  Chloride 98 - 111 mmol/L 105 106 107  CO2 22 - 32 mmol/L 27 27 29   Calcium 8.9 - 10.3 mg/dL 9.7 9.6 9.8  Total Protein 6.5 - 8.1 g/dL 6.5 - 6.8  Total Bilirubin 0.3 - 1.2 mg/dL 0.7 - 0.4  Alkaline Phos 38 - 126 U/L 131(H) - 114  AST 15 - 41 U/L 17 - 20  ALT 0 - 44 U/L 16 - 13    Colonoscopy 08/13/2014 by Dr. KDeatra Ina 1. Mild diverticulosis was noted in the ascending colon and sigmoid colon 2. The examination was otherwise normal  Social History: She is widowed. She has one son and one daughter. Non smoker. No alcohol use.   Family History: Sister with history of stomach cancer. No family history of colon cancer.   Review of Systems:   Constitutional: Negative for fever, sweats, chills or weight loss.  Respiratory: Negative for shortness of breath.   Cardiovascular: Negative for chest pain, palpitations and leg swelling.  Gastrointestinal: See HPI.  Musculoskeletal: Negative for back pain  or muscle aches.  Neurological: Negative for dizziness, headaches or paresthesias.   Current Outpatient Medications on File Prior to Visit  Medication Sig Dispense Refill   acetaminophen (TYLENOL) 650 MG CR tablet Take 650 mg by mouth every 8 (eight) hours as needed for pain.     atorvastatin (LIPITOR) 40 MG tablet TAKE 1 TABLET EVERY DAY 90 tablet 1   Calcium-Magnesium-Vitamin D (CALCIUM 1200+D3 PO) Take 2 capsules by mouth daily.     cyanocobalamin 1000 MCG tablet Take 1,000 mcg by mouth daily.     DULoxetine (CYMBALTA) 30 MG capsule TAKE 1 CAPSULE EVERY DAY 90 capsule 0   Ergocalciferol (DRISDOL PO) Take 3,000 Units by mouth daily.     mirtazapine (REMERON) 7.5  MG tablet TAKE 1 TABLET AT BEDTIME 90 tablet 0   oxyCODONE (OXY IR/ROXICODONE) 5 MG immediate release tablet Take 1 tablet (5 mg total) by mouth every 6 (six) hours as needed for severe pain. 75 tablet 0   pantoprazole (PROTONIX) 40 MG tablet TAKE 1 TABLET EVERY DAY 90 tablet 1   Potassium 99 MG TABS Take by mouth.     VASCEPA 1 g capsule TAKE 2 CAPSULES TWICE DAILY 360 capsule 1   Zinc 50 MG TABS Take by mouth.     No current facility-administered medications on file prior to visit.   No Known Allergies  Physical Exam: Ht 5' 2.75" (1.594 m) Comment: height measured without shoes  Wt 125 lb 2 oz (56.8 kg)   BMI 22.34 kg/m  Wt Readings from Last 3 Encounters:  04/07/21 125 lb 2 oz (56.8 kg)  03/11/21 122 lb (55.3 kg)  12/20/20 131 lb (59.4 kg)    General: 75 year old female in NAD.  Head: Normocephalic and atraumatic. Eyes: No scleral icterus. Conjunctiva pink . Ears: Normal auditory acuity. Mouth: Dentition intact. No ulcers or lesions.  Lungs: Clear throughout to auscultation. Heart: Regular rate and rhythm, no murmur. Abdomen: Soft, nontender and nondistended. No masses or hepatomegaly. Normal bowel sounds x 4 quadrants.  Rectal: Stool soft brown (not black) guaiac negative. I showed the patient stool on exam glove and asked her what color was the stool and she stated black. Internal hemorrhoids without prolapse. CMA Melissa present during exam.  Musculoskeletal: Symmetrical with no gross deformities. Extremities: No edema. Neurological: Alert oriented x 4. No focal deficits.  Psychological: Alert and cooperative. Normal mood and affect  Assessment and Recommendations:  23) 75 year old female with a history of acute colitis in 2015 who developed central abdominal pain and bloody stools x 3 to 4 days 03/2021. Hg 13.3 with + FOBT. Colonoscopy in 2016 showed evidence of diverticulosis without colitis.  -CBC -Colonoscopy benefits and risks discussed including risk with sedation,  risk of bleeding, perforation and infection  -Recommend an abd/pelvic CT angiogram if abdominal pain and hematochezia recurs  -Patient to call office if abdominal pain or rectal bleeding recurs   2) Patient reported passing black colored stools x 4 to 5 weeks. Rectal exam today showed brown stool guaiac negative.  -Reduce Excedrin use -EGD to rule out PUD/UGI malignancy benefits and risks discussed including risk with sedation, risk of bleeding, perforation and infection  -Continue Pantoprazole 36m QD -Add Famotidine 244mQ HS  3) History of a hyperplastic rectosigmoid polyp per colonoscopy 01/2013  4) Mildly elevated Alk phos level with normal AST, ALT and T. Bili levels -Hepatic panel, GGT -RUQ sono if alk phos remains elevated   5) Patient reported having a  10lb weight loss, unintentional over the past year -Recommend CTAP if EGD and colonoscopy unrevealing   Further recommendations to be determined after the above evaluation completed

## 2021-04-12 ENCOUNTER — Telehealth: Payer: Medicare HMO

## 2021-04-12 ENCOUNTER — Telehealth: Payer: Self-pay | Admitting: Pharmacist

## 2021-04-12 ENCOUNTER — Telehealth: Payer: Self-pay

## 2021-04-12 ENCOUNTER — Other Ambulatory Visit: Payer: Self-pay

## 2021-04-12 DIAGNOSIS — R634 Abnormal weight loss: Secondary | ICD-10-CM

## 2021-04-12 DIAGNOSIS — R748 Abnormal levels of other serum enzymes: Secondary | ICD-10-CM

## 2021-04-12 NOTE — Progress Notes (Deleted)
Chronic Care Management Pharmacy Note  04/12/2021 Name:  Charlene Patterson MRN:  [id number] DOB:  June 01, 1946  Summary: ***  Recommendations/Changes made from today's visit: ***  Plan: ***   Subjective: Charlene Patterson is an 75 y.o. year old female who is a primary patient of Janith Lima, MD.  The CCM team was consulted for assistance with disease management and care coordination needs.    Engaged with patient by telephone for follow up visit in response to provider referral for pharmacy case management and/or care coordination services.   Consent to Services:  The patient was given information about Chronic Care Management services, agreed to services, and gave verbal consent prior to initiation of services.  Please see initial visit note for detailed documentation.   Patient Care Team: Janith Lima, MD as PCP - General (Internal Medicine) Charlton Haws, [facility] (Pharmacist)  Recent office visits: 12/20/20 Dr Ronnald Ramp OV: chronic f/u; c/o back pain. Referred to neurosurgery.  Recent consult visits: 04/07/21 Dr Berniece Pap (GI): f/u rectal bleeding, GERD; scheduled EGD and colonoscopy; advised to reduce Excedrin; continue pantoprazole, add famotidine 20 mg HS  Hospital visits: Medication Reconciliation was completed by comparing discharge summary, patient's EMR and Pharmacy list, and upon discussion with patient.  Admitted to the ED on 03/11/21 due to Acute GI bleeding. Discharge date was 03/11/21. Discharged from [facility].   -labs normal; FOBT positive. Advised f/u with GI  Medications that remain the same after Hospital Discharge:??  -All other medications will remain the same.     Objective:  Lab Results  Component Value Date   CREATININE 0.91 03/11/2021   BUN 15 03/11/2021   GFR 57.67 (L) 12/20/2020   GFRNONAA >60 03/11/2021   GFRAA 72 (L) 05/27/2014   NA 139 03/11/2021   K 3.8 03/11/2021   CALCIUM 9.7 03/11/2021   CO2 27 03/11/2021   GLUCOSE 94  03/11/2021    Lab Results  Component Value Date/Time   HGBA1C 5.6 07/30/2017 09:12 AM   GFR 57.67 (L) 12/20/2020 02:06 PM   GFR 60.91 05/20/2020 04:28 PM    Last diabetic Eye exam: No results found for: HMDIABEYEEXA  Last diabetic Foot exam: No results found for: HMDIABFOOTEX   Lab Results  Component Value Date   CHOL 165 05/20/2020   HDL 28.50 (L) 05/20/2020   LDLDIRECT 56.0 05/20/2020   TRIG 293.0 (H) 12/20/2020   CHOLHDL 6 05/20/2020    Hepatic Function Latest Ref Rng & Units 04/07/2021 03/11/2021 05/20/2020  Total Protein 6.0 - 8.3 g/dL 7.0 6.5 6.8  Albumin 3.5 - 5.2 g/dL 4.5 3.9 4.1  AST 0 - 37 U/L 15 17 20   ALT 0 - 35 U/L 14 16 13   Alk Phosphatase 39 - 117 U/L 153(H) 131(H) 114  Total Bilirubin 0.2 - 1.2 mg/dL 0.5 0.7 0.4  Bilirubin, Direct 0.0 - 0.3 mg/dL 0.1 - 0.1    Lab Results  Component Value Date/Time   TSH 2.97 05/20/2020 04:28 PM   TSH 2.61 02/05/2019 11:38 AM    CBC Latest Ref Rng & Units 04/07/2021 03/11/2021 12/20/2020  WBC 4.0 - 10.5 K/uL 3.9(L) 3.7(L) 5.2  Hemoglobin 12.0 - 15.0 g/dL 13.5 13.3 13.0  Hematocrit 36.0 - 46.0 % 39.4 38.6 37.8  Platelets 150.0 - 400.0 K/uL 273.0 259 267.0    Lab Results  Component Value Date/Time   VD25OH 32.12 05/20/2020 04:28 PM   VD25OH 30.11 08/14/2019 01:38 PM    Clinical ASCVD: {YES/NO:21197} The 10-year ASCVD  Patterson score (Arnett DK, et al., 2019) is: 9.4%   Values used to calculate the score:     Age: 89 years     Sex: Female     Is Non-Hispanic African American: No     Diabetic: No     Tobacco smoker: No     Systolic Blood Pressure: 025 mmHg     Is BP treated: No     HDL Cholesterol: 28.5 mg/dL     Total Cholesterol: 165 mg/dL    Depression screen [facility] 2/9 12/20/2020 05/20/2020 10/13/2019  Decreased Interest 0 0 0  Down, Depressed, Hopeless 0 1 0  PHQ - 2 Score 0 1 0  Altered sleeping 0 0 -  Tired, decreased energy 0 0 -  Change in appetite 0 0 -  Feeling bad or failure about yourself  0 0 -  Trouble  concentrating 0 0 -  Moving slowly or fidgety/restless 0 0 -  Suicidal thoughts 0 0 -  PHQ-9 Score 0 1 -  Difficult doing work/chores - - -  Some recent data might be hidden     ***Other: (CHADS2VASc if Afib, MMRC or CAT for COPD, ACT, DEXA)  Social History   Tobacco Use  Smoking Status Never  Smokeless Tobacco Never   BP Readings from Last 3 Encounters:  04/07/21 102/70  03/11/21 122/69  12/20/20 138/82   Pulse Readings from Last 3 Encounters:  04/07/21 80  03/11/21 75  12/20/20 68   Wt Readings from Last 3 Encounters:  04/07/21 125 lb 2 oz (56.8 kg)  03/11/21 122 lb (55.3 kg)  12/20/20 131 lb (59.4 kg)   BMI Readings from Last 3 Encounters:  04/07/21 22.34 kg/m  03/11/21 20.94 kg/m  12/20/20 22.49 kg/m    Assessment/Interventions: Review of patient past medical history, allergies, medications, health status, including review of consultants reports, laboratory and other test data, was performed as part of comprehensive evaluation and provision of chronic care management services.   SDOH:  (Social Determinants of Health) assessments and interventions performed: {yes/no:20286}  SDOH Screenings   Alcohol Screen: Not on file  Depression (PHQ2-9): Low Patterson    PHQ-2 Score: 0  Financial Resource Strain: Not on file  Food Insecurity: Not on file  Housing: Not on file  Physical Activity: Not on file  Social Connections: Not on file  Stress: Not on file  Tobacco Use: Low Patterson    Smoking Tobacco Use: Never   Smokeless Tobacco Use: Never  Transportation Needs: Not on file    [place]  No Known Allergies  Medications Reviewed Today     Reviewed by Noralyn Pick, NP (Nurse Practitioner) on 04/10/21 at 2004  Med List Status: <None>   Medication Order Taking? Sig Documenting Provider Last Dose Status Informant  acetaminophen (TYLENOL) 650 MG CR tablet [id number] Yes Take 650 mg by mouth every 8 (eight) hours as needed for pain. [provider] Taking Active Self  atorvastatin (LIPITOR) 40 MG tablet [id number] Yes TAKE 1 TABLET EVERY DAY Janith Lima, MD Taking Active   Calcium-Magnesium-Vitamin D (CALCIUM 1200+D3 PO) [id number] Yes Take 2 capsules by mouth daily. [provider] Taking Active Self  cyanocobalamin 1000 MCG tablet [id number] Yes Take 1,000 mcg by mouth daily. [provider] Taking Active   DULoxetine (CYMBALTA) 30 MG capsule [id number] Yes TAKE 1 CAPSULE EVERY DAY Janith Lima, MD Taking Active   Ergocalciferol (DRISDOL PO) [id number] Yes Take 3,000 Units by mouth daily. [provider] Taking Active   famotidine (PEPCID) 20 MG tablet [id number]  TAKE 1 TABLET(20 MG) BY MOUTH AT BEDTIME Noralyn Pick, NP  Active   mirtazapine (REMERON) 7.5 MG tablet [id number] Yes TAKE 1 TABLET AT BEDTIME Janith Lima, MD Taking Active   oxyCODONE (OXY IR/ROXICODONE) 5 MG immediate release tablet [id number] Yes Take 1 tablet (5 mg total) by mouth every 6 (six) hours as needed for severe pain. Janith Lima, MD Taking Active   pantoprazole (PROTONIX) 40 MG tablet [id number] Yes TAKE 1 TABLET EVERY DAY Janith Lima, MD Taking Active   Potassium 99 MG TABS [id number] Yes Take by mouth. [provider] Taking Active   SUPREP BOWEL PREP KIT 17.5-3.13-1.6 GM/177ML SOLN [id number] Yes Take 1 kit by mouth as directed. For colonoscopy prep Noralyn Pick, NP  Active   VASCEPA 1 g capsule [id number] Yes TAKE 2 CAPSULES TWICE DAILY Janith Lima, MD Taking Active   Zinc 50 MG TABS [id number] Yes Take by mouth. [provider] Taking Active             Patient Active Problem List   Diagnosis Date Noted   Spinal stenosis in cervical region 08/14/2019   Chronic renal disease, stage 3, moderately decreased glomerular filtration rate (GFR) between 30-59 mL/min/1.73 square meter ([place]) 02/05/2019   Other dietary vitamin B12 deficiency anemia 02/05/2019   Iron  deficiency anemia due to chronic blood loss 07/30/2017   Episode of recurrent major depressive disorder ([place]) 01/05/2017   Routine general medical examination at a health care facility 02/08/2016   Hypertriglyceridemia 05/11/2015   Hyperlipidemia with target LDL less than 130 10/29/2014   Frequent PVCs 10/29/2014   Vitamin D deficiency 09/24/2014   Colitis 05/25/2014   Allergic rhinitis 05/25/2014   Spinal stenosis of lumbar region at multiple levels 05/25/2014   Thoracic compression fracture ([place]) 05/12/2014   Visit for screening mammogram 05/10/2014   GERD (gastroesophageal reflux disease) 12/13/2012   Osteoporosis 12/13/2012   Depression with somatization 12/13/2012    Immunization History  Administered Date(s) Administered   Fluad Quad(high Dose 65+) 08/14/2019   Influenza, High Dose Seasonal PF 07/30/2017, 09/02/2018   Influenza,inj,Quad PF,6+ Mos 04/14/2014, 05/11/2015   Influenza-Unspecified 04/02/2012   Pneumococcal Conjugate-13 05/11/2015   Pneumococcal Polysaccharide-23 12/13/2012, 07/30/2017   Tdap 12/13/2012    Conditions to be addressed/monitored:  {USCCMDZASSESSMENTOPTIONS:23563}  There are no care plans that you recently modified to display for this patient.    Medication Assistance: {MEDASSISTANCEINFO:25044}  Compliance/Adherence/Medication fill history: Care Gaps: None  Star-Rating Drugs: Atorvastatin - LF 02/04/21 x 90 ds  Patient's preferred pharmacy is:  [facility] [address], [address] [place] [address] Phone: [phone] Fax: [phone]  [facility] [place], [place] [address] Phone: [phone] Fax: [phone]  Uses pill box? {Yes or If no, why not?:20788} Pt endorses ***% compliance  We discussed: {Pharmacy options:24294} Patient decided to: {US Pharmacy  Plan:23885}  Care Plan and Follow Up Patient Decision:  {FOLLOWUP:24991}  Plan: {CM FOLLOW UP UVJD:05183}  ***    Current Barriers:  {pharmacybarriers:24917}  Pharmacist Clinical Goal(s):  Patient will {PHARMACYGOALCHOICES:24921} through collaboration with PharmD and provider.   Interventions: 1:1 collaboration with Janith Lima, MD regarding development and update of comprehensive plan of care as evidenced by provider attestation and co-signature Inter-disciplinary care team  collaboration (see longitudinal plan of care) Comprehensive medication review performed; medication list updated in electronic medical record   Hyperlipidemia   Patient has failed these meds in past: [place] Patient is currently controlled on the foTllowing medications:  atorvastatin 40 mg daily,  Vascepa 2 g BID   We discussed: benefits of Vascepa for Trig and ASCVD Patterson reduction; copay for Vascepa is $125 for 90 days - applied for [employer] and conditionally approved. [place] with [place] copay information.   Plan Continue current medications and control with diet and exercise  Vascepa copay via [place]   GERD    Acute GI bleed 03/11/21. Pending EGD, colonoscopy Patient has failed these meds in past: Dexilant, omeprazole, raniditine Patient is currently controlled on the following medications:  pantoprazole 40 mg daily Famotidine 20 mg HS   We discussed: when pt misses a dose of pantoprazole, GERD symptoms return.    Plan: Continue current medications   Depression    Patient has failed these meds in past: n/a Patient is currently controlled on the following medications:  mirtazapine 7.5 mg HS - not filled Duloxetine 30 mg daily   We discussed: pt reports meds are working well for her, denies issues with mood or anxiety.   Plan: Continue current medications    Osteoarthritis    Patient has failed these meds in past: n/a Patient is currently  uncontrolled on the following medications:   Tylenol 650 mg - 2 tablets q AM, 1 tablet in afternoon.    We discussed: maximum daily Tylenol limits 3g/day; recommended Voltaren gel for topical relief; discussed pathophysiology of OA and Tylenol as first line treatment.   Plan: Continue current medications  Recommend OTC Voltaren gel for topical relief   Migraine/Headache    Patient has failed these meds in past: n/a Patient is currently controlled on the following medications:  Excedrin extra strength Excedrin migraine Alka seltzer + sinus   We discussed:  Pt reports she has had headaches/migraines all her life; she reports about 1 migraine per month which can usually be controlled with Excedrin; also reports sinus headaches that she control with Alka seltzer + sinus. She reports she has never taken Rx migraine medications. Discussed available options including triptans and preventative therapy like propranolol; pt is ok controlling her migraines with OTC products right now.   Plan: Continue current medications  Patient Goals/Self-Care Activities Patient will:  - {pharmacypatientgoals:24919}

## 2021-04-12 NOTE — Telephone Encounter (Signed)
Left message to please call back. °

## 2021-04-12 NOTE — Telephone Encounter (Signed)
-----  Message from Noralyn Pick, NP sent at 04/11/2021 11:25 AM EDT ----- Eustaquio Maize, pls inform patient her alk phos level remains elevated. GGT is normal.  Pls send her to the lab in 2 weeks to have alk phos isoenzyme panel, AMA level done and a BMP. DX: elevated alk phos level. Weight loss.   Patient to proceed with EGD and colonoscopy as scheduled with Dr. Silverio Decamp.   THX  Await the above results prior to ordering any abdominal imaging .

## 2021-04-12 NOTE — Telephone Encounter (Signed)
  Chronic Care Management   Outreach Note  04/12/2021 Name: Charlene Patterson MRN: [id number] DOB: 1946-03-08  Referred by: Janith Lima, MD  Patient had a phone appointment scheduled with clinical pharmacist today.  An unsuccessful telephone outreach was attempted today. The patient was referred to the pharmacist for assistance with medications, care management and care coordination.   Patient will NOT be penalized in any way for missing a CCM appointment. The no-show fee does not apply.  If possible, a message was left to return call to: [phone] or to [facility]: [place], PharmD, Para March, CPP Clinical Pharmacist [facility] [phone]

## 2021-04-25 ENCOUNTER — Encounter: Payer: Self-pay | Admitting: Gastroenterology

## 2021-04-25 ENCOUNTER — Other Ambulatory Visit (INDEPENDENT_AMBULATORY_CARE_PROVIDER_SITE_OTHER): Payer: Medicare HMO

## 2021-04-25 ENCOUNTER — Ambulatory Visit (AMBULATORY_SURGERY_CENTER): Payer: Medicare HMO | Admitting: Gastroenterology

## 2021-04-25 VITALS — BP 126/64 | HR 63 | Temp 98.2°F | Resp 12 | Ht 62.75 in | Wt 125.0 lb

## 2021-04-25 DIAGNOSIS — K219 Gastro-esophageal reflux disease without esophagitis: Secondary | ICD-10-CM | POA: Diagnosis not present

## 2021-04-25 DIAGNOSIS — R748 Abnormal levels of other serum enzymes: Secondary | ICD-10-CM | POA: Diagnosis not present

## 2021-04-25 DIAGNOSIS — R1013 Epigastric pain: Secondary | ICD-10-CM

## 2021-04-25 DIAGNOSIS — D122 Benign neoplasm of ascending colon: Secondary | ICD-10-CM | POA: Diagnosis not present

## 2021-04-25 DIAGNOSIS — R634 Abnormal weight loss: Secondary | ICD-10-CM | POA: Diagnosis not present

## 2021-04-25 DIAGNOSIS — K625 Hemorrhage of anus and rectum: Secondary | ICD-10-CM

## 2021-04-25 DIAGNOSIS — K648 Other hemorrhoids: Secondary | ICD-10-CM

## 2021-04-25 MED ORDER — SODIUM CHLORIDE 0.9 % IV SOLN
500.0000 mL | Freq: Once | INTRAVENOUS | Status: DC
Start: 2021-04-25 — End: 2021-04-25

## 2021-04-25 NOTE — Op Note (Addendum)
[name] Procedure Date: 04/25/2021 3:54 PM MRN: [id number] Endoscopist: Mauri Pole , MD Age: 75 Referring MD:  Date of Birth: 01-Jun-1946 Gender: Female Account #: [id number] Procedure:                Colonoscopy Indications:              Rectal bleeding, Weight loss, failure to thrive Medicines:                Monitored Anesthesia Care Procedure:                Pre-Anesthesia Assessment:                           - Prior to the procedure, a History and Physical                            was performed, and patient medications and                            allergies were reviewed. The patient's tolerance of                            previous anesthesia was also reviewed. The risks                            and benefits of the procedure and the sedation                            options and risks were discussed with the patient.                            All questions were answered, and informed consent                            was obtained. Prior Anticoagulants: The patient has                            taken no previous anticoagulant or antiplatelet                            agents. ASA Grade Assessment: II - A patient with                            mild systemic disease. After reviewing the risks                            and benefits, the patient was deemed in                            satisfactory condition to undergo the procedure.                           After obtaining informed consent, the colonoscope  was passed under direct vision. Throughout the                            procedure, the patient's blood pressure, pulse, and                            oxygen saturations were monitored continuously. The                            Olympus [id number]-[id number]) Colonoscope was                            introduced through the anus and advanced to the the                            cecum,  identified by appendiceal orifice and                            ileocecal valve. The colonoscopy was performed                            without difficulty. The patient tolerated the                            procedure well. The quality of the bowel                            preparation was excellent. The ileocecal valve,                            appendiceal orifice, and rectum were photographed. Scope In: 4:03:24 PM Scope Out: 4:19:47 PM Scope Withdrawal Time: 0 hours 9 minutes 7 seconds  Total Procedure Duration: 0 hours 16 minutes 23 seconds  Findings:                 The perianal and digital rectal examinations were                            normal.                           Two sessile polyps were found in the ascending                            colon. The polyps were 8 to 10 mm in size. These                            polyps were removed with a cold snare. Resection                            and retrieval were complete.                           Non-bleeding external and internal hemorrhoids were  found during retroflexion. The hemorrhoids were                            medium-sized. Complications:            No immediate complications. Estimated Blood Loss:     Estimated blood loss was minimal. Estimated blood                            loss was minimal. Impression:               - Two 8 to 10 mm polyps in the ascending colon,                            removed with a cold snare. Resected and retrieved.                           - Non-bleeding external and internal hemorrhoids. Recommendation:           - Patient has a contact number available for                            emergencies. The signs and symptoms of potential                            delayed complications were discussed with the                            patient. Return to normal activities tomorrow.                            Written discharge instructions were provided to the                             patient.                           - Resume previous diet.                           - Continue present medications.                           - Await pathology results.                           - No repeat colonoscopy due to age.                           - Schedule CT abd & pelvis with contrast for                            further evaluation of weight loss                           - BMP                           -  Follow up in GI office visit next available visit Mauri Pole, MD 04/25/2021 4:44:00 PM This report has been signed electronically.

## 2021-04-25 NOTE — Progress Notes (Signed)
VS completed by AS.  Medical and surgical history reviewed and updated.

## 2021-04-25 NOTE — Patient Instructions (Signed)
Please read handouts provided. Continue present medications. Await pathology results. Follow-up in GI office visit.   YOU HAD AN ENDOSCOPIC PROCEDURE TODAY AT [facility]:   Refer to the procedure report that was given to you for any specific questions about what was found during the examination.  If the procedure report does not answer your questions, please call your gastroenterologist to clarify.  If you requested that your care partner not be given the details of your procedure findings, then the procedure report has been included in a sealed envelope for you to review at your convenience later.  YOU SHOULD EXPECT: Some feelings of bloating in the abdomen. Passage of more gas than usual.  Walking can help get rid of the air that was put into your GI tract during the procedure and reduce the bloating. If you had a lower endoscopy (such as a colonoscopy or flexible sigmoidoscopy) you may notice spotting of blood in your stool or on the toilet paper. If you underwent a bowel prep for your procedure, you may not have a normal bowel movement for a few days.  Please Note:  You might notice some irritation and congestion in your nose or some drainage.  This is from the oxygen used during your procedure.  There is no need for concern and it should clear up in a day or so.  SYMPTOMS TO REPORT IMMEDIATELY:  Following lower endoscopy (colonoscopy or flexible sigmoidoscopy):  Excessive amounts of blood in the stool  Significant tenderness or worsening of abdominal pains  Swelling of the abdomen that is new, acute  Fever of 100F or higher  Following upper endoscopy (EGD)  Vomiting of blood or coffee ground material  New chest pain or pain under the shoulder blades  Painful or persistently difficult swallowing  New shortness of breath  Fever of 100F or higher  Black, tarry-looking stools  For urgent or emergent issues, a gastroenterologist can be reached at any hour by calling  [phone]. Do not use MyChart messaging for urgent concerns.    DIET:  We do recommend a small meal at first, but then you may proceed to your regular diet.  Drink plenty of fluids but you should avoid alcoholic beverages for 24 hours.  ACTIVITY:  You should plan to take it easy for the rest of today and you should NOT DRIVE or use heavy machinery until tomorrow (because of the sedation medicines used during the test).    FOLLOW UP: Our staff will call the number listed on your records 48-72 hours following your procedure to check on you and address any questions or concerns that you may have regarding the information given to you following your procedure. If we do not reach you, we will leave a message.  We will attempt to reach you two times.  During this call, we will ask if you have developed any symptoms of COVID 19. If you develop any symptoms (ie: fever, flu-like symptoms, shortness of breath, cough etc.) before then, please call [phone].  If you test positive for Covid 19 in the 2 weeks post procedure, please call and report this information to [place].    If any biopsies were taken you will be contacted by phone or by letter within the next 1-3 weeks.  Please call us at [phone] if you have not heard about the biopsies in 3 weeks.    SIGNATURES/CONFIDENTIALITY: You and/or your care partner have signed paperwork which will be entered into your electronic  medical record.  These signatures attest to the fact that that the information above on your After Visit Summary has been reviewed and is understood.  Full responsibility of the confidentiality of this discharge information lies with you and/or your care-partner.  

## 2021-04-25 NOTE — Progress Notes (Signed)
Please refer to office visit note 04/07/21. No additional changes in H&P Patient is appropriate for planned procedure(s) and anesthesia in an ambulatory setting  K. Denzil Magnuson , MD [phone]

## 2021-04-25 NOTE — Op Note (Signed)
[place] Patient Name: Charlene Patterson Procedure Date: 04/25/2021 3:53 PM MRN: [id number] Endoscopist: Mauri Pole , MD Age: 75 Referring MD:  Date of Birth: 1946/06/09 Gender: Female Account #: [id number] Procedure:                Upper GI endoscopy Indications:              Epigastric abdominal pain, Weight loss Medicines:                Monitored Anesthesia Care Procedure:                Pre-Anesthesia Assessment:                           - Prior to the procedure, a History and Physical                            was performed, and patient medications and                            allergies were reviewed. The patient's tolerance of                            previous anesthesia was also reviewed. The risks                            and benefits of the procedure and the sedation                            options and risks were discussed with the patient.                            All questions were answered, and informed consent                            was obtained. Prior Anticoagulants: The patient has                            taken no previous anticoagulant or antiplatelet                            agents. ASA Grade Assessment: II - A patient with                            mild systemic disease. After reviewing the risks                            and benefits, the patient was deemed in                            satisfactory condition to undergo the procedure.                           After obtaining informed consent, the endoscope was  passed under direct vision. Throughout the                            procedure, the patient's blood pressure, pulse, and                            oxygen saturations were monitored continuously. The                            GIF [id number] #[id number] was introduced through the                            mouth, and advanced to the second part of duodenum.                            The upper GI  endoscopy was accomplished without                            difficulty. The patient tolerated the procedure                            well. Scope In: Scope Out: Findings:                 The Z-line was regular and was found 35 cm from the                            incisors.                           No gross lesions were noted in the entire esophagus.                           A 5 cm hiatal hernia was present.                           The cardia and gastric fundus were normal on                            retroflexion.                           The examined duodenum was normal. Complications:            No immediate complications. Estimated Blood Loss:     Estimated blood loss was minimal. Impression:               - Z-line regular, 35 cm from the incisors.                           - No gross lesions in esophagus.                           - 5 cm hiatal hernia.                           -  Normal examined duodenum.                           - No specimens collected. Recommendation:           - Resume previous diet.                           - Continue present medications.                           - See the other procedure note for documentation of                            additional recommendations. Mauri Pole, MD 04/25/2021 4:28:25 PM This report has been signed electronically.

## 2021-04-25 NOTE — Progress Notes (Signed)
Called to room to assist during endoscopic procedure.  Patient ID and intended procedure confirmed with present staff. Received instructions for my participation in the procedure from the performing physician.  

## 2021-04-25 NOTE — Progress Notes (Signed)
Awesome Jared CRNA relieves [employer]

## 2021-04-26 LAB — BASIC METABOLIC PANEL
BUN: 14 mg/dL (ref 6–23)
CO2: 25 mEq/L (ref 19–32)
Calcium: 9.5 mg/dL (ref 8.4–10.5)
Chloride: 106 mEq/L (ref 96–112)
Creatinine, Ser: 0.99 mg/dL (ref 0.40–1.20)
GFR: 56.14 mL/min — ABNORMAL LOW (ref >60.00)
Glucose, Bld: 89 mg/dL (ref 70–99)
Potassium: 4.1 mEq/L (ref 3.5–5.1)
Sodium: 141 mEq/L (ref 135–145)

## 2021-04-27 ENCOUNTER — Telehealth: Payer: Self-pay | Admitting: *Deleted

## 2021-04-27 NOTE — Telephone Encounter (Signed)
  Follow up Call-  Call back number 04/25/2021  Post procedure Call Back phone  # [phone]  Permission to leave phone message Yes  Some recent data might be hidden     Patient questions:  Do you have a fever, pain , or abdominal swelling? No. Pain Score  0 *  Have you tolerated food without any problems? Yes.    Have you been able to return to your normal activities? Yes.    Do you have any questions about your discharge instructions: Diet   No. Medications  No. Follow up visit  No.  Do you have questions or concerns about your Care? No.  Actions: * If pain score is 4 or above: No action needed, pain <4.  Have you developed a fever since your procedure? no  2.   Have you had an respiratory symptoms (SOB or cough) since your procedure? no  3.   Have you tested positive for COVID 19 since your procedure no  4.   Have you had any family members/close contacts diagnosed with the COVID 19 since your procedure?  no   If yes to any of these questions please route to Joylene John, RN and Joella Prince, RN

## 2021-04-28 ENCOUNTER — Telehealth: Payer: Self-pay

## 2021-04-28 LAB — ALKALINE PHOSPHATASE ISOENZYMES
Alkaline phosphatase (APISO): 125 U/L (ref 37–153)
Bone Isoenzymes: 59 % (ref 28–66)
Intestinal Isoenzymes: 0 % — ABNORMAL LOW (ref 1–24)
Liver Isoenzymes: 41 % (ref 25–69)
Macrohepatic isoenzymes: 0 % (ref <=0)
Placental isoenzymes: 0 % (ref <=0)

## 2021-04-28 LAB — MITOCHONDRIAL ANTIBODIES: Mitochondrial M2 Ab, IgG: <=20 U (ref <=20.0)

## 2021-04-28 NOTE — Telephone Encounter (Signed)
-----  Message from Noralyn Pick, NP sent at 04/28/2021  3:02 PM EDT ----- Lenna Sciara, pls contact the patient and let her know her alk phos level is now normal. Thx

## 2021-04-29 NOTE — Telephone Encounter (Signed)
Notified patient of test result , patient expressed understanding and agreement, no further questions at this time.

## 2021-05-03 ENCOUNTER — Other Ambulatory Visit: Payer: Self-pay

## 2021-05-03 DIAGNOSIS — R634 Abnormal weight loss: Secondary | ICD-10-CM

## 2021-05-10 ENCOUNTER — Encounter: Payer: Self-pay | Admitting: Gastroenterology

## 2021-05-17 ENCOUNTER — Other Ambulatory Visit: Payer: Self-pay | Admitting: Internal Medicine

## 2021-05-17 DIAGNOSIS — E781 Pure hyperglyceridemia: Secondary | ICD-10-CM

## 2021-05-19 ENCOUNTER — Ambulatory Visit (HOSPITAL_COMMUNITY)
Admission: RE | Admit: 2021-05-19 | Discharge: 2021-05-19 | Disposition: A | Discharge status: Home / Self Care | Payer: Medicare HMO | Source: Ambulatory Visit | Attending: Gastroenterology | Admitting: Gastroenterology

## 2021-05-19 DIAGNOSIS — M47816 Spondylosis without myelopathy or radiculopathy, lumbar region: Secondary | ICD-10-CM | POA: Diagnosis not present

## 2021-05-19 DIAGNOSIS — M5126 Other intervertebral disc displacement, lumbar region: Secondary | ICD-10-CM | POA: Diagnosis not present

## 2021-05-19 DIAGNOSIS — R634 Abnormal weight loss: Secondary | ICD-10-CM | POA: Diagnosis not present

## 2021-05-19 DIAGNOSIS — K449 Diaphragmatic hernia without obstruction or gangrene: Secondary | ICD-10-CM | POA: Diagnosis not present

## 2021-05-19 MED ORDER — IOHEXOL 350 MG/ML SOLN
80.0000 mL | Freq: Once | INTRAVENOUS | Status: AC | PRN
  Administered 2021-05-19: 11:00:00 80 mL via INTRAVENOUS

## 2021-06-03 ENCOUNTER — Telehealth: Payer: Self-pay | Admitting: Internal Medicine

## 2021-06-03 ENCOUNTER — Other Ambulatory Visit: Payer: Self-pay | Admitting: Internal Medicine

## 2021-06-03 DIAGNOSIS — F450 Somatization disorder: Secondary | ICD-10-CM

## 2021-06-03 DIAGNOSIS — M159 Polyosteoarthritis, unspecified: Secondary | ICD-10-CM

## 2021-06-03 MED ORDER — DULOXETINE HCL 30 MG PO CPEP
30.0000 mg | ORAL_CAPSULE | Freq: Every day | ORAL | 0 refills | Status: DC
Start: 2021-06-03 — End: 2021-06-03

## 2021-06-03 MED ORDER — DULOXETINE HCL 30 MG PO CPEP
30.0000 mg | ORAL_CAPSULE | Freq: Every day | ORAL | 0 refills | Status: DC
Start: 2021-06-03 — End: 2021-06-24

## 2021-06-03 NOTE — Telephone Encounter (Signed)
1.Medication Requested: DULoxetine (CYMBALTA) 30 MG capsule  2. Pharmacy (Name, [place]): [id number] - [address], [place] [place]  Phone:  [phone] Fax:  [phone]   3. On Med List: yes  4. Last Visit with PCP: 06.20.22  5. Next visit date with PCP: n/a   Agent: Please be advised that RX refills may take up to 3 business days. We ask that you follow-up with your pharmacy.

## 2021-06-13 ENCOUNTER — Telehealth: Payer: Self-pay | Admitting: Gastroenterology

## 2021-06-13 NOTE — Telephone Encounter (Signed)
Inbound call from patient requesting Ct results. Please advise.

## 2021-06-13 NOTE — Telephone Encounter (Signed)
Called patient and discussed results.  No significant GI abnormality other than hiatal hernia She is no longer loosing weight, it is stable in past few weeks  She has significant degenerative lumbar spine disease and stenosis, advised patient to follow up with her PMD and spine specialist. She has known lumbar spine degenerative disease  Please schedule GI office follow up visit in 4 months Thanks

## 2021-06-13 NOTE — Telephone Encounter (Signed)
Did the results of the CT route to you?

## 2021-06-14 NOTE — Telephone Encounter (Signed)
April schedule is not available yet.   4 month office recall in epic.

## 2021-06-15 NOTE — Progress Notes (Signed)
Reviewed and agree with documentation and assessment and plan. K. Veena Quantrell Splitt , MD   

## 2021-06-23 ENCOUNTER — Telehealth: Payer: Self-pay

## 2021-06-23 NOTE — Telephone Encounter (Signed)
Pt calling requesting: DULoxetine (CYMBALTA) 30 MG capsule be sent to [facility].

## 2021-06-24 ENCOUNTER — Other Ambulatory Visit: Payer: Self-pay | Admitting: Internal Medicine

## 2021-06-24 DIAGNOSIS — M159 Polyosteoarthritis, unspecified: Secondary | ICD-10-CM

## 2021-06-24 DIAGNOSIS — F32A Depression, unspecified: Secondary | ICD-10-CM

## 2021-06-24 MED ORDER — DULOXETINE HCL 30 MG PO CPEP: 30.0000 mg | ORAL_CAPSULE | Freq: Every day | ORAL | 0 refills | Status: DC

## 2021-06-30 ENCOUNTER — Other Ambulatory Visit: Payer: Self-pay

## 2021-06-30 MED ORDER — FAMOTIDINE 20 MG PO TABS: ORAL_TABLET | ORAL | 3 refills | Status: DC

## 2021-07-08 ENCOUNTER — Other Ambulatory Visit: Payer: Self-pay

## 2021-07-08 MED ORDER — FAMOTIDINE 20 MG PO TABS: ORAL_TABLET | ORAL | 3 refills | Status: DC

## 2021-07-14 ENCOUNTER — Other Ambulatory Visit: Payer: Self-pay | Admitting: Internal Medicine

## 2021-07-14 DIAGNOSIS — Z1231 Encounter for screening mammogram for malignant neoplasm of breast: Secondary | ICD-10-CM

## 2021-07-26 DIAGNOSIS — H5213 Myopia, bilateral: Secondary | ICD-10-CM | POA: Diagnosis not present

## 2021-07-26 DIAGNOSIS — H0102A Squamous blepharitis right eye, upper and lower eyelids: Secondary | ICD-10-CM | POA: Diagnosis not present

## 2021-07-26 DIAGNOSIS — H25813 Combined forms of age-related cataract, bilateral: Secondary | ICD-10-CM | POA: Diagnosis not present

## 2021-07-26 DIAGNOSIS — H10413 Chronic giant papillary conjunctivitis, bilateral: Secondary | ICD-10-CM | POA: Diagnosis not present

## 2021-07-26 DIAGNOSIS — H0102B Squamous blepharitis left eye, upper and lower eyelids: Secondary | ICD-10-CM | POA: Diagnosis not present

## 2021-07-26 DIAGNOSIS — H33321 Round hole, right eye: Secondary | ICD-10-CM | POA: Diagnosis not present

## 2021-07-27 ENCOUNTER — Telehealth: Payer: Self-pay

## 2021-07-27 NOTE — Telephone Encounter (Signed)
Pt is calling ask about getting a bone density scan. Pt is having a Mammogram on 1/31 hoping to have this done on same day.   Last one in Epic 08/2017  Please advise [phone]

## 2021-07-28 ENCOUNTER — Other Ambulatory Visit: Payer: Self-pay | Admitting: Internal Medicine

## 2021-07-28 DIAGNOSIS — E2839 Other primary ovarian failure: Secondary | ICD-10-CM

## 2021-07-28 DIAGNOSIS — M8000XA Age-related osteoporosis with current pathological fracture, unspecified site, initial encounter for fracture: Secondary | ICD-10-CM

## 2021-07-28 HISTORY — DX: Other primary ovarian failure: E28.39

## 2021-08-02 ENCOUNTER — Ambulatory Visit
Admission: RE | Admit: 2021-08-02 | Discharge: 2021-08-02 | Disposition: A | Discharge status: Home / Self Care | Payer: Medicare HMO | Source: Ambulatory Visit | Attending: Internal Medicine | Admitting: Internal Medicine

## 2021-08-02 DIAGNOSIS — Z1231 Encounter for screening mammogram for malignant neoplasm of breast: Secondary | ICD-10-CM

## 2021-08-29 ENCOUNTER — Other Ambulatory Visit: Payer: Self-pay | Admitting: Internal Medicine

## 2021-08-29 DIAGNOSIS — M159 Polyosteoarthritis, unspecified: Secondary | ICD-10-CM

## 2021-08-29 DIAGNOSIS — F450 Somatization disorder: Secondary | ICD-10-CM

## 2021-08-29 DIAGNOSIS — F32A Depression, unspecified: Secondary | ICD-10-CM

## 2021-09-19 ENCOUNTER — Ambulatory Visit: Payer: Medicare HMO | Admitting: Gastroenterology

## 2021-09-19 ENCOUNTER — Encounter: Payer: Self-pay | Admitting: Gastroenterology

## 2021-09-19 VITALS — BP 102/60 | HR 87 | Ht 62.0 in | Wt 130.0 lb

## 2021-09-19 DIAGNOSIS — K219 Gastro-esophageal reflux disease without esophagitis: Secondary | ICD-10-CM

## 2021-09-19 DIAGNOSIS — K449 Diaphragmatic hernia without obstruction or gangrene: Secondary | ICD-10-CM

## 2021-09-19 MED ORDER — FAMOTIDINE 20 MG PO TABS: ORAL_TABLET | ORAL | 3 refills | Status: DC

## 2021-09-19 MED ORDER — PANTOPRAZOLE SODIUM 40 MG PO TBEC: 40.0000 mg | DELAYED_RELEASE_TABLET | Freq: Every day | ORAL | 3 refills | Status: DC

## 2021-09-19 NOTE — Patient Instructions (Addendum)
We have sent the following medications to your pharmacy for you to pick up at your convenience:   Pantoprazole  Famotidine  ? ?Follow up in 1 year ? ?If you are age 75 or older, your body mass index should be between 23-30. Your Body mass index is 23.78 kg/m?Marland Kitchen If this is out of the aforementioned range listed, please consider follow up with your Primary Care Provider. ? ?If you are age 51 or younger, your body mass index should be between 19-25. Your Body mass index is 23.78 kg/m?Marland Kitchen If this is out of the aformentioned range listed, please consider follow up with your Primary Care Provider.  ? ?________________________________________________________ ? ?The [facility] providers would like to encourage you to use [facility] to communicate with providers for non-urgent requests or questions.  Due to long hold times on the telephone, sending your provider a message by [facility] may be a faster and more efficient way to get a response.  Please allow 48 business hours for a response.  Please remember that this is for non-urgent requests.  ?_______________________________________________________  ? ?I appreciate the  opportunity to care for you ? ?Thank You  ? ?Harl Bowie , MD  ?

## 2021-09-19 NOTE — Progress Notes (Signed)
? ?       ? ?Charlene Patterson    [id number]    28-Jan-1946 ? ?Primary Care Physician:Jones, Arvid Right, MD ? ?Referring Physician: Janith Lima, MD ?[place],  [place] [address] ? ? ?Chief complaint: GERD ? ?HPI: ? ?76 year old very pleasant female here for follow-up visit for chronic GERD.  She is currently taking pantoprazole daily with good control of symptoms. She is following antireflux measures. ?She is s/p hemorrhoidal band ligation ?Overall she is doing well with no specific GI complaints. ?Denies any nausea, vomiting, abdominal pain, melena or bright red blood per rectum ? ? ?EGD 04/25/21 ?- Z-line regular, 35 cm from the incisors. ?- No gross lesions in esophagus. ?- 5 cm hiatal hernia. ?- Normal examined duodenum. ? ?Colonoscopy 04/25/21 ?- Two 8 to 10 mm polyps in the ascending colon, removed with a cold snare. Resected and ?retrieved. ?- Non-bleeding external and internal hemorrhoids. ? ? ?Outpatient Encounter Medications as of 09/19/2021  ?Medication Sig  ? acetaminophen (TYLENOL) 650 MG CR tablet Take 650 mg by mouth every 8 (eight) hours as needed for pain.  ? atorvastatin (LIPITOR) 40 MG tablet TAKE 1 TABLET EVERY DAY  ? Calcium-Magnesium-Vitamin D (CALCIUM 1200+D3 PO) Take 2 capsules by mouth daily.  ? cyanocobalamin 1000 MCG tablet Take 1,000 mcg by mouth daily.  ? DULoxetine (CYMBALTA) 30 MG capsule TAKE 1 CAPSULE(30 MG) BY MOUTH DAILY  ? Ergocalciferol (DRISDOL PO) Take 3,000 Units by mouth daily.  ? famotidine (PEPCID) 20 MG tablet TAKE 1 TABLET(20 MG) BY MOUTH AT BEDTIME  ? mirtazapine (REMERON) 7.5 MG tablet TAKE 1 TABLET AT BEDTIME  ? pantoprazole (PROTONIX) 40 MG tablet TAKE 1 TABLET EVERY DAY  ? Potassium 99 MG TABS Take by mouth.  ? VASCEPA 1 g capsule TAKE 2 CAPSULES TWICE DAILY  ? Zinc 50 MG TABS Take by mouth.  ? ?No facility-administered encounter medications on file as of 09/19/2021.  ? ? ?Allergies as of 09/19/2021  ? (No Known Allergies)  ? ? ?Past Medical History:   ?Diagnosis Date  ? Allergy   ? Anxiety   ? Arthritis   ? "left foot" (05/26/2014)  ? Chronic back pain   ? Chronic neck pain   ? Depression   ? GERD (gastroesophageal reflux disease)   ? History of chicken pox   ? History of colon polyps   ? History of hiatal hernia   ? HLD (hyperlipidemia)   ? IBS (irritable bowel syndrome)   ? Lower GI bleeding   ? Migraines   ? "monthly usually; weekly lately" (05/26/2014)  ? Sciatica   ? Thoracic compression fracture ([place])   ? ? ?Past Surgical History:  ?Procedure Laterality Date  ? ABDOMINAL HYSTERECTOMY  ~ 1978  ? "partial"  ? APPENDECTOMY  ~ 1978  ? CERVICAL CONE BIOPSY    ? COLONOSCOPY  07/04/2003  ? last 2005  ? POLYPECTOMY    ? UPPER GASTROINTESTINAL ENDOSCOPY    ? ? ?Family History  ?Problem Relation Age of Onset  ? Heart disease Mother   ? Heart disease Father   ? Prostate cancer Father   ? Heart attack Father   ? Diabetes Sister   ? Stomach cancer Sister 17  ? Irritable bowel syndrome Sister   ? Colon polyps Sister   ? Diabetes Sister   ? Dementia Sister   ? Diabetes Brother   ? Heart disease Brother   ? Heart attack Brother   ?  Cancer Brother   ?     type unknown  ? Heart attack Brother   ? Heart disease Maternal Grandmother   ? Migraines Paternal Grandfather   ? Cerebral palsy Son   ? Heart disease Maternal Uncle   ? Colon polyps Other   ?     pt has 12 brothers and sisters, at least 2-3  had polyps  ? Rectal cancer Neg Hx   ? Colon cancer Neg Hx   ? ? ?Social History  ? ?Socioeconomic History  ? Marital status: Widowed  ?  Spouse name: Not on file  ? Number of children: 2  ? Years of education: 38  ? Highest education level: Not on file  ?Occupational History  ? Occupation: Retired  ?Tobacco Use  ? Smoking status: Never  ? Smokeless tobacco: Never  ?Vaping Use  ? Vaping Use: Never used  ?Substance and Sexual Activity  ? Alcohol use: No  ? Drug use: No  ? Sexual activity: Never  ?Other Topics Concern  ? Not on file  ?Social History Narrative  ? Regular exercise-no  ?  Caffeine Use-yes  ? ?Social Determinants of Health  ? ?Financial Resource Strain: Not on file  ?Food Insecurity: Not on file  ?Transportation Needs: Not on file  ?Physical Activity: Not on file  ?Stress: Not on file  ?Social Connections: Not on file  ?Intimate Partner Violence: Not on file  ? ? ? ? ?Review of systems: ?All other review of systems negative except as mentioned in the HPI. ? ? ?Physical Exam: ?Vitals:  ? 09/19/21 1047  ?BP: 102/60  ?Pulse: 87  ? ?Body mass index is 23.78 kg/m?. ?Gen:      No acute distress ?HEENT:  sclera anicteric ?Abd:      soft, non-tender; no palpable masses, no distension ?Ext:    No edema ?Neuro: alert and oriented x 3 ?Psych: normal mood and affect ? ?Data Reviewed: ? ?Reviewed labs, radiology imaging, old records and pertinent past GI work up ? ? ?Assessment and Plan/Recommendations: ? ?76 year old very pleasant female with history of hiatal hernia, chronic GERD, symptomatic hemorrhoids s/p hemorrhoidal band ligation ? ?GERD and hiatal hernia: Continue pantoprazole 40 mg daily, 30 minutes before breakfast ?Antireflux measures ?Famotidine 20 mg at bedtime as needed ? ?History of advanced adenomatous colon polyps: No recall due to age ? ?Symptomatic hemorrhoids s/p hemorrhoidal band ligation with improvement of symptoms ? ?Return in 1 year or sooner if needed ? ?This visit required 30 minutes of patient care (this includes precharting, chart review, review of results, face-to-face time used for counseling as well as treatment plan and follow-up. The patient was provided an opportunity to ask questions and all were answered. The patient agreed with the plan and demonstrated an understanding of the instructions. ? ?K. Denzil Magnuson , MD ?  ? ?CC: Janith Lima, MD ? ? ?

## 2021-10-10 ENCOUNTER — Other Ambulatory Visit: Payer: Self-pay | Admitting: Internal Medicine

## 2021-10-10 ENCOUNTER — Ambulatory Visit (INDEPENDENT_AMBULATORY_CARE_PROVIDER_SITE_OTHER): Payer: Medicare HMO

## 2021-10-10 DIAGNOSIS — M150 Primary generalized (osteo)arthritis: Secondary | ICD-10-CM

## 2021-10-10 DIAGNOSIS — Z0000 Encounter for general adult medical examination without abnormal findings: Secondary | ICD-10-CM

## 2021-10-10 DIAGNOSIS — M159 Polyosteoarthritis, unspecified: Secondary | ICD-10-CM

## 2021-10-10 DIAGNOSIS — F32A Depression, unspecified: Secondary | ICD-10-CM

## 2021-10-10 DIAGNOSIS — F450 Somatization disorder: Secondary | ICD-10-CM

## 2021-10-10 NOTE — Progress Notes (Signed)
? ?Subjective:  ? Charlene Patterson is a 76 y.o. female who presents for Medicare Annual (Subsequent) preventive examination. ? ?I connected with Dollene Primrose today by telephone and verified that I am speaking with the correct person using two identifiers. ?Location patient: home ?Location provider: work ?Persons participating in the virtual visit: patient, provider. ?  ?I discussed the limitations, risks, security and privacy concerns of performing an evaluation and management service by telephone and the availability of in person appointments. I also discussed with the patient that there may be a patient responsible charge related to this service. The patient expressed understanding and verbally consented to this telephonic visit.  ?  ?Interactive audio and video telecommunications were attempted between this provider and patient, however failed, due to patient having technical difficulties OR patient did not have access to video capability.  We continued and completed visit with audio only. ? ?  ?Review of Systems    ? ?Cardiac Risk Factors include: advanced age (>66mn, >>63women);dyslipidemia ? ?   ?Objective:  ?  ?Today's Vitals  ? ?There is no height or weight on file to calculate BMI. ? ? ?  10/10/2021  ?  1:16 PM 10/13/2019  ?  1:44 PM 09/02/2018  ?  3:54 PM 08/27/2017  ?  2:36 PM 12/22/2016  ? 11:48 AM 11/29/2016  ?  3:03 PM 11/16/2016  ?  9:54 AM  ?Advanced Directives  ?Does Patient Have a Medical Advance Directive? Yes Yes Yes No Yes No No  ?Type of Advance Directive Living will [place]  [place]    ?Copy of [place]?  No - copy requested No - copy requested  No - copy requested    ?Would patient like information on creating a medical advance directive?    Yes (ED - Information included in AVS)     ? ? ?Current Medications (verified) ?Outpatient Encounter Medications as of  10/10/2021  ?Medication Sig  ? acetaminophen (TYLENOL) 650 MG CR tablet Take 650 mg by mouth every 8 (eight) hours as needed for pain.  ? atorvastatin (LIPITOR) 40 MG tablet TAKE 1 TABLET EVERY DAY  ? Calcium-Magnesium-Vitamin D (CALCIUM 1200+D3 PO) Take 2 capsules by mouth daily.  ? cyanocobalamin 1000 MCG tablet Take 1,000 mcg by mouth daily.  ? DULoxetine (CYMBALTA) 30 MG capsule TAKE 1 CAPSULE(30 MG) BY MOUTH DAILY  ? Ergocalciferol (DRISDOL PO) Take 3,000 Units by mouth daily.  ? famotidine (PEPCID) 20 MG tablet TAKE 1 TABLET(20 MG) BY MOUTH AT BEDTIME  ? pantoprazole (PROTONIX) 40 MG tablet Take 1 tablet (40 mg total) by mouth daily.  ? Potassium 99 MG TABS Take by mouth.  ? VASCEPA 1 g capsule TAKE 2 CAPSULES TWICE DAILY  ? Zinc 50 MG TABS Take by mouth.  ? mirtazapine (REMERON) 7.5 MG tablet TAKE 1 TABLET AT BEDTIME (Patient not taking: Reported on 10/10/2021)  ? ?No facility-administered encounter medications on file as of 10/10/2021.  ? ? ?Allergies (verified) ?Patient has no known allergies.  ? ?History: ?Past Medical History:  ?Diagnosis Date  ? Allergy   ? Anxiety   ? Arthritis   ? "left foot" (05/26/2014)  ? Chronic back pain   ? Chronic neck pain   ? Depression   ? GERD (gastroesophageal reflux disease)   ? History of chicken pox   ? History of colon polyps   ? History of hiatal hernia   ?  HLD (hyperlipidemia)   ? IBS (irritable bowel syndrome)   ? Lower GI bleeding   ? Migraines   ? "monthly usually; weekly lately" (05/26/2014)  ? Sciatica   ? Thoracic compression fracture ([place])   ? ?Past Surgical History:  ?Procedure Laterality Date  ? ABDOMINAL HYSTERECTOMY  ~ 1978  ? "partial"  ? APPENDECTOMY  ~ 1978  ? CERVICAL CONE BIOPSY    ? COLONOSCOPY  07/04/2003  ? last 2005  ? POLYPECTOMY    ? UPPER GASTROINTESTINAL ENDOSCOPY    ? ?Family History  ?Problem Relation Age of Onset  ? Heart disease Mother   ? Heart disease Father   ? Prostate cancer Father   ? Heart attack Father   ? Diabetes Sister   ? Stomach  cancer Sister 20  ? Irritable bowel syndrome Sister   ? Colon polyps Sister   ? Diabetes Sister   ? Dementia Sister   ? Diabetes Brother   ? Heart disease Brother   ? Heart attack Brother   ? Cancer Brother   ?     type unknown  ? Heart attack Brother   ? Heart disease Maternal Grandmother   ? Migraines Paternal Grandfather   ? Cerebral palsy Son   ? Heart disease Maternal Uncle   ? Colon polyps Other   ?     pt has 12 brothers and sisters, at least 2-3  had polyps  ? Rectal cancer Neg Hx   ? Colon cancer Neg Hx   ? ?Social History  ? ?Socioeconomic History  ? Marital status: Widowed  ?  Spouse name: Not on file  ? Number of children: 2  ? Years of education: 36  ? Highest education level: Not on file  ?Occupational History  ? Occupation: Retired  ?Tobacco Use  ? Smoking status: Never  ? Smokeless tobacco: Never  ?Vaping Use  ? Vaping Use: Never used  ?Substance and Sexual Activity  ? Alcohol use: No  ? Drug use: No  ? Sexual activity: Never  ?Other Topics Concern  ? Not on file  ?Social History Narrative  ? Regular exercise-no  ? Caffeine Use-yes  ? ?Social Determinants of Health  ? ?Financial Resource Strain: Low Risk   ? Difficulty of Paying Living Expenses: Not hard at all  ?Food Insecurity: No Food Insecurity  ? Worried About Charity fundraiser in the Last Year: Never true  ? Ran Out of Food in the Last Year: Never true  ?Transportation Needs: No Transportation Needs  ? Lack of Transportation (Medical): No  ? Lack of Transportation (Non-Medical): No  ?Physical Activity: Insufficiently Active  ? Days of Exercise per Week: 3 days  ? Minutes of Exercise per Session: 30 min  ?Stress: No Stress Concern Present  ? Feeling of Stress : Not at all  ?Social Connections: Moderately Integrated  ? Frequency of Communication with Friends and Family: Twice a week  ? Frequency of Social Gatherings with Friends and Family: Twice a week  ? Attends Religious Services: More than 4 times per year  ? Active Member of Clubs or  Organizations: Yes  ? Attends Archivist Meetings: 1 to 4 times per year  ? Marital Status: Widowed  ? ? ?Tobacco Counseling ?Counseling given: Not Answered ? ? ?Clinical Intake: ? ?Pre-visit preparation completed: Yes ? ?Pain : No/denies pain ? ?  ? ?Nutritional Risks: None ?Diabetes: No ? ?How often do you need to have someone help you when you  read instructions, pamphlets, or other written materials from your doctor or pharmacy?: 1 - Never ?What is the last grade level you completed in school?: High school ? ?Diabetic?no  ? ?Interpreter Needed?: No ? ?Information entered by :: [email or web address] ? ? ?Activities of Daily Living ? ?  10/10/2021  ?  1:18 PM 12/20/2020  ?  1:32 PM  ?In your present state of health, do you have any difficulty performing the following activities:  ?Hearing? 0 0  ?Vision? 0 0  ?Difficulty concentrating or making decisions? 0 0  ?Walking or climbing stairs? 0 0  ?Dressing or bathing? 0 0  ?Doing errands, shopping? 0 0  ?Preparing Food and eating ? N   ?Using the Toilet? N   ?In the past six months, have you accidently leaked urine? N   ?Do you have problems with loss of bowel control? N   ?Managing your Medications? N   ?Managing your Finances? N   ?Housekeeping or managing your Housekeeping? N   ? ? ?Patient Care Team: ?Janith Lima, MD as PCP - General (Internal Medicine) ?Charlton Haws, [facility] (Pharmacist) ? ?Indicate any recent Medical Services you may have received from other than [facility] providers in the past year (date may be approximate). ? ?   ?Assessment:  ? This is a routine wellness examination for [facility]. ? ?Hearing/Vision screen ?Vision Screening - Comments:: Annual eye exams wears glasses  ? ?Dietary issues and exercise activities discussed: ?Current Exercise Habits: Home exercise routine, Type of exercise: walking, Time (Minutes): 30, Frequency (Times/Week): 3, Weekly Exercise (Minutes/Week): 90, Intensity: Mild, Exercise limited by: None identified ? ? Goals  Addressed   ? ?  ?  ?  ?  ? This Visit's Progress  ?  Client understands the importance of follow-up with providers by attending scheduled visits   On track  ? ?  ? ?Depression Screen ? ?  10/10/2021  ?  1:18 PM 10/10/2021

## 2021-10-10 NOTE — Patient Instructions (Signed)
Charlene Patterson , ?Thank you for taking time to come for your Medicare Wellness Visit. I appreciate your ongoing commitment to your health goals. Please review the following plan we discussed and let me know if I can assist you in the future.  ? ?Screening recommendations/referrals: ?Colonoscopy: no longer required  ?Mammogram: no longer required  ?Bone Density: 10/01/2017 ?Recommended yearly ophthalmology/optometry visit for glaucoma screening and checkup ?Recommended yearly dental visit for hygiene and checkup ? ?Vaccinations: ?Influenza vaccine: declined  ?Pneumococcal vaccine: completed  ?Tdap vaccine: 12/13/2012 ?Shingles vaccine: declined    ? ?Advanced directives: none  ? ?Conditions/risks identified: none  ? ?Next appointment: none  ? ? ?Preventive Care 75 Years and Older, Female ?Preventive care refers to lifestyle choices and visits with your health care provider that can promote health and wellness. ?What does preventive care include? ?A yearly physical exam. This is also called an annual well check. ?Dental exams once or twice a year. ?Routine eye exams. Ask your health care provider how often you should have your eyes checked. ?Personal lifestyle choices, including: ?Daily care of your teeth and gums. ?Regular physical activity. ?Eating a healthy diet. ?Avoiding tobacco and drug use. ?Limiting alcohol use. ?Practicing safe sex. ?Taking low-dose aspirin every day. ?Taking vitamin and mineral supplements as recommended by your health care provider. ?What happens during an annual well check? ?The services and screenings done by your health care provider during your annual well check will depend on your age, overall health, lifestyle risk factors, and family history of disease. ?Counseling  ?Your health care provider may ask you questions about your: ?Alcohol use. ?Tobacco use. ?Drug use. ?Emotional well-being. ?Home and relationship well-being. ?Sexual activity. ?Eating habits. ?History of falls. ?Memory and  ability to understand (cognition). ?Work and work Statistician. ?Reproductive health. ?Screening  ?You may have the following tests or measurements: ?Height, weight, and BMI. ?Blood pressure. ?Lipid and cholesterol levels. These may be checked every 5 years, or more frequently if you are over 75 years old. ?Skin check. ?Lung cancer screening. You may have this screening every year starting at age 75 if you have a 30-pack-year history of smoking and currently smoke or have quit within the past 15 years. ?Fecal occult blood test (FOBT) of the stool. You may have this test every year starting at age 75. ?Flexible sigmoidoscopy or colonoscopy. You may have a sigmoidoscopy every 5 years or a colonoscopy every 10 years starting at age 75. ?Hepatitis C blood test. ?Hepatitis B blood test. ?Sexually transmitted disease (STD) testing. ?Diabetes screening. This is done by checking your blood sugar (glucose) after you have not eaten for a while (fasting). You may have this done every 1-3 years. ?Bone density scan. This is done to screen for osteoporosis. You may have this done starting at age 75. ?Mammogram. This may be done every 1-2 years. Talk to your health care provider about how often you should have regular mammograms. ?Talk with your health care provider about your test results, treatment options, and if necessary, the need for more tests. ?Vaccines  ?Your health care provider may recommend certain vaccines, such as: ?Influenza vaccine. This is recommended every year. ?Tetanus, diphtheria, and acellular pertussis (Tdap, Td) vaccine. You may need a Td booster every 10 years. ?Zoster vaccine. You may need this after age 75. ?Pneumococcal 13-valent conjugate (PCV13) vaccine. One dose is recommended after age 75. ?Pneumococcal polysaccharide (PPSV23) vaccine. One dose is recommended after age 75. ?Talk to your health care provider about which screenings and vaccines  you need and how often you need them. ?This information is  not intended to replace advice given to you by your health care provider. Make sure you discuss any questions you have with your health care provider. ?Document Released: 07/16/2015 Document Revised: 03/08/2016 Document Reviewed: 04/20/2015 ?Elsevier Interactive Patient Education ? 2017 [place]. ? ?Fall Prevention in the Home ?Falls can cause injuries. They can happen to people of all ages. There are many things you can do to make your home safe and to help prevent falls. ?What can I do on the outside of my home? ?Regularly fix the edges of walkways and driveways and fix any cracks. ?Remove anything that might make you trip as you walk through a door, such as a raised step or threshold. ?Trim any bushes or trees on the path to your home. ?Use bright outdoor lighting. ?Clear any walking paths of anything that might make someone trip, such as rocks or tools. ?Regularly check to see if handrails are loose or broken. Make sure that both sides of any steps have handrails. ?Any raised decks and porches should have guardrails on the edges. ?Have any leaves, snow, or ice cleared regularly. ?Use sand or salt on walking paths during winter. ?Clean up any spills in your garage right away. This includes oil or grease spills. ?What can I do in the bathroom? ?Use night lights. ?Install grab bars by the toilet and in the tub and shower. Do not use towel bars as grab bars. ?Use non-skid mats or decals in the tub or shower. ?If you need to sit down in the shower, use a plastic, non-slip stool. ?Keep the floor dry. Clean up any water that spills on the floor as soon as it happens. ?Remove soap buildup in the tub or shower regularly. ?Attach bath mats securely with double-sided non-slip rug tape. ?Do not have throw rugs and other things on the floor that can make you trip. ?What can I do in the bedroom? ?Use night lights. ?Make sure that you have a light by your bed that is easy to reach. ?Do not use any sheets or blankets that  are too big for your bed. They should not hang down onto the floor. ?Have a firm chair that has side arms. You can use this for support while you get dressed. ?Do not have throw rugs and other things on the floor that can make you trip. ?What can I do in the kitchen? ?Clean up any spills right away. ?Avoid walking on wet floors. ?Keep items that you use a lot in easy-to-reach places. ?If you need to reach something above you, use a strong step stool that has a grab bar. ?Keep electrical cords out of the way. ?Do not use floor polish or wax that makes floors slippery. If you must use wax, use non-skid floor wax. ?Do not have throw rugs and other things on the floor that can make you trip. ?What can I do with my stairs? ?Do not leave any items on the stairs. ?Make sure that there are handrails on both sides of the stairs and use them. Fix handrails that are broken or loose. Make sure that handrails are as long as the stairways. ?Check any carpeting to make sure that it is firmly attached to the stairs. Fix any carpet that is loose or worn. ?Avoid having throw rugs at the top or bottom of the stairs. If you do have throw rugs, attach them to the floor with carpet tape. ?Make sure  that you have a light switch at the top of the stairs and the bottom of the stairs. If you do not have them, ask someone to add them for you. ?What else can I do to help prevent falls? ?Wear shoes that: ?Do not have high heels. ?Have rubber bottoms. ?Are comfortable and fit you well. ?Are closed at the toe. Do not wear sandals. ?If you use a stepladder: ?Make sure that it is fully opened. Do not climb a closed stepladder. ?Make sure that both sides of the stepladder are locked into place. ?Ask someone to hold it for you, if possible. ?Clearly mark and make sure that you can see: ?Any grab bars or handrails. ?First and last steps. ?Where the edge of each step is. ?Use tools that help you move around (mobility aids) if they are needed. These  include: ?Canes. ?Walkers. ?Scooters. ?Crutches. ?Turn on the lights when you go into a dark area. Replace any light bulbs as soon as they burn out. ?Set up your furniture so you have a clear path. Avoid mov

## 2021-12-30 ENCOUNTER — Ambulatory Visit
Admission: RE | Admit: 2021-12-30 | Discharge: 2021-12-30 | Disposition: A | Discharge status: Home / Self Care | Payer: Medicare HMO | Source: Ambulatory Visit | Attending: Internal Medicine | Admitting: Internal Medicine

## 2021-12-30 ENCOUNTER — Other Ambulatory Visit: Payer: Self-pay | Admitting: Internal Medicine

## 2021-12-30 DIAGNOSIS — M8589 Other specified disorders of bone density and structure, multiple sites: Secondary | ICD-10-CM | POA: Diagnosis not present

## 2021-12-30 DIAGNOSIS — E2839 Other primary ovarian failure: Secondary | ICD-10-CM

## 2021-12-30 DIAGNOSIS — Z780 Asymptomatic menopausal state: Secondary | ICD-10-CM | POA: Diagnosis not present

## 2021-12-30 DIAGNOSIS — M8000XA Age-related osteoporosis with current pathological fracture, unspecified site, initial encounter for fracture: Secondary | ICD-10-CM

## 2021-12-30 DIAGNOSIS — M810 Age-related osteoporosis without current pathological fracture: Secondary | ICD-10-CM | POA: Diagnosis not present

## 2021-12-31 ENCOUNTER — Telehealth: Payer: Self-pay

## 2021-12-31 NOTE — Telephone Encounter (Signed)
Prolia VOB initiated via [email or web address]  New start  Also check Evenity

## 2021-12-31 NOTE — Telephone Encounter (Signed)
Charlene Lima, MD  Jasper Loser, CMA Will see if she is a candidate for evenity or prolia?

## 2022-01-06 ENCOUNTER — Other Ambulatory Visit: Payer: Self-pay | Admitting: Internal Medicine

## 2022-01-06 DIAGNOSIS — E785 Hyperlipidemia, unspecified: Secondary | ICD-10-CM

## 2022-01-10 ENCOUNTER — Encounter: Payer: Self-pay | Admitting: Internal Medicine

## 2022-01-10 ENCOUNTER — Ambulatory Visit (INDEPENDENT_AMBULATORY_CARE_PROVIDER_SITE_OTHER): Payer: Medicare HMO | Admitting: Internal Medicine

## 2022-01-10 VITALS — BP 110/68 | HR 67 | Temp 98.1°F | Ht 62.0 in | Wt 128.0 lb

## 2022-01-10 DIAGNOSIS — F450 Somatization disorder: Secondary | ICD-10-CM

## 2022-01-10 DIAGNOSIS — D513 Other dietary vitamin B12 deficiency anemia: Secondary | ICD-10-CM | POA: Diagnosis not present

## 2022-01-10 DIAGNOSIS — E781 Pure hyperglyceridemia: Secondary | ICD-10-CM

## 2022-01-10 DIAGNOSIS — E785 Hyperlipidemia, unspecified: Secondary | ICD-10-CM | POA: Diagnosis not present

## 2022-01-10 DIAGNOSIS — N1831 Chronic kidney disease, stage 3a: Secondary | ICD-10-CM | POA: Diagnosis not present

## 2022-01-10 DIAGNOSIS — F339 Major depressive disorder, recurrent, unspecified: Secondary | ICD-10-CM

## 2022-01-10 DIAGNOSIS — M8000XA Age-related osteoporosis with current pathological fracture, unspecified site, initial encounter for fracture: Secondary | ICD-10-CM | POA: Diagnosis not present

## 2022-01-10 DIAGNOSIS — D500 Iron deficiency anemia secondary to blood loss (chronic): Secondary | ICD-10-CM | POA: Diagnosis not present

## 2022-01-10 DIAGNOSIS — R4189 Other symptoms and signs involving cognitive functions and awareness: Secondary | ICD-10-CM

## 2022-01-10 DIAGNOSIS — Z0001 Encounter for general adult medical examination with abnormal findings: Secondary | ICD-10-CM | POA: Insufficient documentation

## 2022-01-10 LAB — BASIC METABOLIC PANEL
BUN: 19 mg/dL (ref 6–23)
CO2: 26 mEq/L (ref 19–32)
Calcium: 10 mg/dL (ref 8.4–10.5)
Chloride: 105 mEq/L (ref 96–112)
Creatinine, Ser: 1.1 mg/dL (ref 0.40–1.20)
GFR: 49.23 mL/min — ABNORMAL LOW (ref >60.00)
Glucose, Bld: 86 mg/dL (ref 70–99)
Potassium: 4.5 mEq/L (ref 3.5–5.1)
Sodium: 139 mEq/L (ref 135–145)

## 2022-01-10 LAB — CBC WITH DIFFERENTIAL/PLATELET
Basophils Absolute: 0 10*3/uL (ref 0.0–0.1)
Basophils Relative: 0.3 % (ref 0.0–3.0)
Eosinophils Absolute: 0 10*3/uL (ref 0.0–0.7)
Eosinophils Relative: 1.1 % (ref 0.0–5.0)
HCT: 37.9 % (ref 36.0–46.0)
Hemoglobin: 13 g/dL (ref 12.0–15.0)
Lymphocytes Relative: 39.1 % (ref 12.0–46.0)
Lymphs Abs: 1.6 10*3/uL (ref 0.7–4.0)
MCHC: 34.2 g/dL (ref 30.0–36.0)
MCV: 89 fl (ref 78.0–100.0)
Monocytes Absolute: 0.3 10*3/uL (ref 0.1–1.0)
Monocytes Relative: 6.8 % (ref 3.0–12.0)
Neutro Abs: 2.1 10*3/uL (ref 1.4–7.7)
Neutrophils Relative %: 52.7 % (ref 43.0–77.0)
Platelets: 248 10*3/uL (ref 150.0–400.0)
RBC: 4.26 Mil/uL (ref 3.87–5.11)
RDW: 14.7 % (ref 11.5–15.5)
WBC: 4 10*3/uL (ref 4.0–10.5)

## 2022-01-10 LAB — LIPID PANEL
Cholesterol: 162 mg/dL (ref 0–200)
HDL: 42.9 mg/dL (ref >39.00)
LDL Cholesterol: 85 mg/dL (ref 0–99)
NonHDL: 119.54
Total CHOL/HDL Ratio: 4
Triglycerides: 174 mg/dL — ABNORMAL HIGH (ref 0.0–149.0)
VLDL: 34.8 mg/dL (ref 0.0–40.0)

## 2022-01-10 NOTE — Patient Instructions (Signed)

## 2022-01-10 NOTE — Progress Notes (Unsigned)
Subjective:  Patient ID: Charlene Patterson, female    DOB: 1945/12/19  Age: 76 y.o. MRN: [id number]  CC: Annual Exam   HPI ADDLEY BALLINGER presents for a CPX and f/up -  She thinks she is taking duloxetine but according to prescription refills she is no longer taking Remeron.  She continues to complain of daily headaches, cognitive decline, decreasing memory, diffuse pain, and apathy.  Outpatient Medications Prior to Visit  Medication Sig Dispense Refill   acetaminophen (TYLENOL) 650 MG CR tablet Take 650 mg by mouth every 8 (eight) hours as needed for pain.     atorvastatin (LIPITOR) 40 MG tablet TAKE 1 TABLET EVERY DAY 90 tablet 1   Calcium-Magnesium-Vitamin D (CALCIUM 1200+D3 PO) Take 2 capsules by mouth daily.     cyanocobalamin 1000 MCG tablet Take 1,000 mcg by mouth daily.     DULoxetine (CYMBALTA) 30 MG capsule TAKE 1 CAPSULE EVERY DAY 90 capsule 0   Ergocalciferol (DRISDOL PO) Take 3,000 Units by mouth daily.     famotidine (PEPCID) 20 MG tablet TAKE 1 TABLET(20 MG) BY MOUTH AT BEDTIME 90 tablet 3   pantoprazole (PROTONIX) 40 MG tablet Take 1 tablet (40 mg total) by mouth daily. 90 tablet 3   Potassium 99 MG TABS Take by mouth.     VASCEPA 1 g capsule TAKE 2 CAPSULES TWICE DAILY 360 capsule 1   Zinc 50 MG TABS Take by mouth.     mirtazapine (REMERON) 7.5 MG tablet TAKE 1 TABLET AT BEDTIME 90 tablet 0   No facility-administered medications prior to visit.    ROS Review of Systems  Constitutional:  Negative for chills, diaphoresis, fatigue and fever.  HENT: Negative.    Eyes: Negative.   Respiratory:  Negative for cough, chest tightness, shortness of breath and wheezing.   Cardiovascular:  Negative for chest pain, palpitations and leg swelling.  Gastrointestinal:  Negative for abdominal pain, constipation, diarrhea, nausea and vomiting.  Endocrine: Negative.   Genitourinary: Negative.  Negative for difficulty urinating.  Musculoskeletal:  Positive for arthralgias, back pain  and neck pain. Negative for myalgias.  Skin: Negative.   Neurological:  Negative for dizziness, weakness and light-headedness.  Hematological:  Negative for adenopathy. Does not bruise/bleed easily.  Psychiatric/Behavioral:  Positive for confusion, decreased concentration and dysphoric mood. Negative for behavioral problems, sleep disturbance and suicidal ideas. The patient is not nervous/anxious.     Objective:  BP 110/68 (BP Location: Left Arm, Patient Position: Sitting, Cuff Size: Large)   Pulse 67   Temp 98.1 F (36.7 C) (Oral)   Ht '5\' 2"'$  (1.575 m)   Wt 128 lb (58.1 kg)   SpO2 98%   BMI 23.41 kg/m   BP Readings from Last 3 Encounters:  01/10/22 110/68  09/19/21 102/60  04/25/21 126/64    Wt Readings from Last 3 Encounters:  01/10/22 128 lb (58.1 kg)  09/19/21 130 lb (59 kg)  04/25/21 125 lb (56.7 kg)    Physical Exam Vitals reviewed.  HENT:     Mouth/Throat:     Mouth: Mucous membranes are moist.  Eyes:     General: No scleral icterus.    Conjunctiva/sclera: Conjunctivae normal.  Cardiovascular:     Rate and Rhythm: Normal rate and regular rhythm.     Heart sounds: No murmur heard. Pulmonary:     Effort: Pulmonary effort is normal.     Breath sounds: No stridor. No wheezing, rhonchi or rales.  Abdominal:     General:  Abdomen is flat.     Palpations: There is no mass.     Tenderness: There is no abdominal tenderness. There is no guarding.     Hernia: No hernia is present.  Musculoskeletal:        General: Normal range of motion.     Cervical back: Neck supple.     Right lower leg: No edema.     Left lower leg: No edema.  Lymphadenopathy:     Cervical: No cervical adenopathy.  Skin:    General: Skin is warm and dry.  Neurological:     General: No focal deficit present.     Mental Status: She is alert. Mental status is at baseline.  Psychiatric:        Thought Content: Thought content normal.     Lab Results  Component Value Date   WBC 4.0  01/10/2022   HGB 13.0 01/10/2022   HCT 37.9 01/10/2022   PLT 248.0 01/10/2022   GLUCOSE 86 01/10/2022   CHOL 162 01/10/2022   TRIG 174.0 (H) 01/10/2022   HDL 42.90 01/10/2022   LDLDIRECT 56.0 05/20/2020   LDLCALC 85 01/10/2022   ALT 14 04/07/2021   AST 15 04/07/2021   NA 139 01/10/2022   K 4.5 01/10/2022   CL 105 01/10/2022   CREATININE 1.10 01/10/2022   BUN 19 01/10/2022   CO2 26 01/10/2022   TSH 2.97 05/20/2020   HGBA1C 5.6 07/30/2017    DG Bone Density  Result Date: 12/30/2021 EXAM: DUAL X-RAY ABSORPTIOMETRY (DXA) FOR BONE MINERAL DENSITY IMPRESSION: Referring Physician:  Janith Lima Your patient completed a bone mineral density test using GE Lunar iDXA system (analysis version: 16). Technologist: [place] PATIENT: Name: Charlene, Patterson Patient ID: [id number] Birth Date: [birth year] Height: 62.0 in. Sex: Female Measured: 12/30/2021 Weight: 128.0 lbs. Indications: Advanced Age, Bilateral Ovariectomy (65.51), Estrogen Deficient, Hysterectomy, Postmenopausal Fractures: Left Hip, Vertebrae Treatments: Calcium (E943.0), Vitamin D (E933.5) ASSESSMENT: The BMD measured at Forearm Radius 33% is 0.598 g/cm2 with a T-score of -3.2. This patient is considered osteoporotic according to [facility]) criteria. The quality of the exam is good. L3 was excluded due to degenerative changes. Site Region Measured Date Measured Age YA BMD Significant CHANGE T-score Left Forearm Radius 33% 12/30/2021 75.2 -3.2 0.598 g/cm2 AP Spine L1-L4 (L3) 12/30/2021 75.2 -1.9 0.944 g/cm2 * AP Spine L1-L4 (L3) 09/27/2017 71.0 -1.2 1.031 g/cm2 DualFemur Total Left 12/30/2021 75.2 -2.0 0.751 g/cm2 * DualFemur Total Left 09/27/2017 71.0 -1.5 0.814 g/cm2 DualFemur Total Mean 12/30/2021 75.2 -2.0 0.760 g/cm2 * DualFemur Total Mean 09/27/2017 71.0 -1.5 0.814 g/cm2 World Health Organization [facility]) criteria for post-menopausal, Caucasian Women: Normal       T-score at or above -1 SD Osteopenia   T-score between -1 and  -2.5 SD Osteoporosis T-score at or below -2.5 SD RECOMMENDATION: 1. All patients should optimize calcium and vitamin D intake. 2. Consider FDA-approved medical therapies in postmenopausal women and men aged 73 years and older, based on the following: a. A hip or vertebral (clinical or morphometric) fracture. b. T-score = -2.5 at the femoral neck or spine after appropriate evaluation to exclude secondary causes. c. Low bone mass (T-score between -1.0 and -2.5 at the femoral neck or spine) and a 10-year probability of a hip fracture = 3% or a 10-year probability of a major osteoporosis-related fracture = 20% based on the [place]-adapted WHO algorithm. d. Clinician judgment and/or patient preferences may indicate treatment for people with 10-year fracture probabilities above  or below these levels. FOLLOW-UP: Patients with diagnosis of osteoporosis or at high risk for fracture should have regular bone mineral density tests.? Patients eligible for Medicare are allowed routine testing every 2 years.? The testing frequency can be increased to one year for patients who have rapidly progressing disease, are receiving or discontinuing medical therapy to restore bone mass, or have additional risk factors. I have reviewed this study and agree with the findings. [facility] Electronically Signed   By: Elmer Picker M.D.   On: 12/30/2021 13:25    Assessment & Plan:   Lillyen was seen today for annual exam.  Diagnoses and all orders for this visit:  Stage 3a chronic kidney disease ([place])- Her renal fxn has declined. -     Cancel: Basic metabolic panel; Future -     Basic metabolic panel; Future -     Basic metabolic panel -     Ambulatory referral to Nephrology  Hyperlipidemia with target LDL less than 130- LDL goal achieved. Doing well on the statin  -     Cancel: Lipid panel; Future -     Lipid panel; Future -     Lipid panel  Hypertriglyceridemia -     Cancel: Lipid panel; Future -     Lipid  panel; Future -     Lipid panel  Iron deficiency anemia due to chronic blood loss- H/H are normal. -     CBC with Differential/Platelet; Future -     CBC with Differential/Platelet  Other dietary vitamin B12 deficiency anemia- Her H/H are normal. -     CBC with Differential/Platelet; Future -     CBC with Differential/Platelet  Age-related osteoporosis with current pathological fracture, initial encounter- Will treat with prolia.  Encounter for general adult medical examination with abnormal findings- Exam completed, labs reviewed, vaccines are up-to-date, cancer screenings are up-to-date, patient education was given.  Episode of recurrent major depressive disorder, unspecified depression episode severity ([place]) -     Ambulatory referral to Psychiatry  Depression with somatization -     Ambulatory referral to Psychiatry  Cognitive decline -     Ambulatory referral to Neurology   I have discontinued Valrie L. Kirtley's mirtazapine. I am also having her maintain her Calcium-Magnesium-Vitamin D (CALCIUM 1200+D3 PO), acetaminophen, Ergocalciferol (DRISDOL PO), cyanocobalamin, Potassium, Zinc, Vascepa, famotidine, pantoprazole, DULoxetine, and atorvastatin.  No orders of the defined types were placed in this encounter.    Follow-up: Return in about 6 months (around 07/13/2022).  Scarlette Calico, MD

## 2022-01-11 ENCOUNTER — Encounter: Payer: Self-pay | Admitting: Internal Medicine

## 2022-01-12 NOTE — Telephone Encounter (Signed)
[place] VOB initiated via [email or web address]  New start - also checking PROLIA coverage (PA required).

## 2022-01-12 NOTE — Telephone Encounter (Signed)
Prior auth required for PROLIA ? ?PA PROCESS DETAILS: PA is required. PA can be initiated by calling [phone] or online at ?[email or web address] ? ?

## 2022-01-13 NOTE — Telephone Encounter (Signed)
Janith Lima, MD  Jasper Loser, CMA Prolia is preferred   TJ

## 2022-01-17 ENCOUNTER — Encounter: Payer: Self-pay | Admitting: Physician Assistant

## 2022-01-19 NOTE — Telephone Encounter (Signed)
Prior Authorization initiated for PROLIA via [email or web address] KEY: [id number]

## 2022-01-23 ENCOUNTER — Other Ambulatory Visit: Payer: Medicare HMO

## 2022-01-23 ENCOUNTER — Encounter: Payer: Self-pay | Admitting: Physician Assistant

## 2022-01-23 ENCOUNTER — Ambulatory Visit: Payer: Medicare HMO | Admitting: Physician Assistant

## 2022-01-23 VITALS — BP 135/80 | HR 77 | Ht 62.0 in | Wt 130.0 lb

## 2022-01-23 DIAGNOSIS — G309 Alzheimer's disease, unspecified: Secondary | ICD-10-CM | POA: Diagnosis not present

## 2022-01-23 DIAGNOSIS — R413 Other amnesia: Secondary | ICD-10-CM

## 2022-01-23 DIAGNOSIS — F0280 Dementia in other diseases classified elsewhere without behavioral disturbance: Secondary | ICD-10-CM | POA: Diagnosis not present

## 2022-01-23 LAB — VITAMIN B12: Vitamin B-12: 1131 pg/mL — ABNORMAL HIGH (ref 211–911)

## 2022-01-23 MED ORDER — DONEPEZIL HCL 10 MG PO TABS: ORAL_TABLET | ORAL | 11 refills | Status: DC

## 2022-01-23 NOTE — Progress Notes (Signed)
Please inform patient B12 is good thanks

## 2022-01-23 NOTE — Patient Instructions (Addendum)
It was a pleasure to see you today at our office.   Recommendations:  Neurocognitive evaluation at our office MRI of the brain, the radiology office will call you to arrange you appointment Check labs today We will start donepezil half tablet ('5mg'$ ) daily for 2  weeks.  If you are tolerating the medication, then after 2 weeks, we will increase the dose to a full tablet of 10 mg daily.  Side effects include nausea, vomiting, diarrhea, vivid dreams, and muscle cramps.   Follow up in 3 months   Whom to call:  Memory  decline, memory medications: Call our office [phone]   For psychiatric meds, mood meds: Please have your primary care physician manage these medications.   Counseling regarding caregiver distress, including caregiver depression, anxiety and issues regarding community resources, adult day care programs, adult living facilities, or memory care questions:   Feel free to contact [place], Social Worker at [phone]   For assessment of decision of mental capacity and competency:  Call Dr. Anthoney Harada, geriatric psychiatrist at [phone]  For guidance in geriatric dementia issues please call [facility] [phone]  Consider [place]  [place], [place] [address] [phone]  Hours of Operation Mondays to Thursdays: 8 am to 8 pm,Fridays: 9 am to 8 pm, Saturdays: 9 am to 1 pm Sundays: Closed  [email or web address]   If you have any severe symptoms of a stroke, or other severe issues such as confusion,severe chills or fever, etc call 911 or go to the ER as you may need to be evaluated further       RECOMMENDATIONS FOR ALL PATIENTS WITH MEMORY PROBLEMS: 1. Continue to exercise (Recommend 30 minutes of walking everyday, or 3 hours every week) 2. Increase social interactions - continue going to [place] and enjoy social gatherings with  friends and family 3. Eat healthy, avoid fried foods and eat more fruits and vegetables 4. Maintain adequate blood pressure, blood sugar, and blood cholesterol level. Reducing the risk of stroke and cardiovascular disease also helps promoting better memory. 5. Avoid stressful situations. Live a simple life and avoid aggravations. Organize your time and prepare for the next day in anticipation. 6. Sleep well, avoid any interruptions of sleep and avoid any distractions in the bedroom that may interfere with adequate sleep quality 7. Avoid sugar, avoid sweets as there is a strong link between excessive sugar intake, diabetes, and cognitive impairment We discussed the Mediterranean diet, which has been shown to help patients reduce the risk of progressive memory disorders and reduces cardiovascular risk. This includes eating fish, eat fruits and green leafy vegetables, nuts like almonds and hazelnuts, walnuts, and also use olive oil. Avoid fast foods and fried foods as much as possible. Avoid sweets and sugar as sugar use has been linked to worsening of memory function.  There is always a concern of gradual progression of memory problems. If this is the case, then we may need to adjust level of care according to patient needs. Support, both to the patient and caregiver, should then be put into place.      You have been referred for a neuropsychological evaluation (i.e., evaluation of memory and thinking abilities). Please bring someone with you to this appointment if possible, as it is helpful for the doctor to hear from both you and another adult who knows you well. Please bring eyeglasses and hearing aids if you wear them.    The evaluation will take approximately 3 hours  and has two parts:   The first part is a clinical interview with the neuropsychologist (Dr. Melvyn Novas or Dr. Nicole Kindred). During the interview, the neuropsychologist will speak with you and the individual you brought to the appointment.     The second part of the evaluation is testing with the doctor's technician Hinton Dyer or Maudie Mercury). During the testing, the technician will ask you to remember different types of material, solve problems, and answer some questionnaires. Your family member will not be present for this portion of the evaluation.   Please note: We must reserve several hours of the neuropsychologist's time and the psychometrician's time for your evaluation appointment. As such, there is a No-Show fee of $100. If you are unable to attend any of your appointments, please contact our office as soon as possible to reschedule.    FALL PRECAUTIONS: Be cautious when walking. Scan the area for obstacles that may increase the risk of trips and falls. When getting up in the mornings, sit up at the edge of the bed for a few minutes before getting out of bed. Consider elevating the bed at the head end to avoid drop of blood pressure when getting up. Walk always in a well-lit room (use night lights in the walls). Avoid area rugs or power cords from appliances in the middle of the walkways. Use a walker or a cane if necessary and consider physical therapy for balance exercise. Get your eyesight checked regularly.  FINANCIAL OVERSIGHT: Supervision, especially oversight when making financial decisions or transactions is also recommended.  HOME SAFETY: Consider the safety of the kitchen when operating appliances like stoves, microwave oven, and blender. Consider having supervision and share cooking responsibilities until no longer able to participate in those. Accidents with firearms and other hazards in the house should be identified and addressed as well.   ABILITY TO BE LEFT ALONE: If patient is unable to contact 911 operator, consider using LifeLine, or when the need is there, arrange for someone to stay with patients. Smoking is a fire hazard, consider supervision or cessation. Risk of wandering should be assessed by caregiver and if detected at  any point, supervision and safe proof recommendations should be instituted.  MEDICATION SUPERVISION: Inability to self-administer medication needs to be constantly addressed. Implement a mechanism to ensure safe administration of the medications.   DRIVING: Regarding driving, in patients with progressive memory problems, driving will be impaired. We advise to have someone else do the driving if trouble finding directions or if minor accidents are reported. Independent driving assessment is available to determine safety of driving.   If you are interested in the driving assessment, you can contact the following:  [employer] in [place]  [place] [phone] or [phone]    [place] refers to food and lifestyle choices that are based on the traditions of countries located on the [employer]. This way of eating has been shown to help prevent certain conditions and improve outcomes for people who have chronic diseases, like kidney disease and heart disease. What are tips for following this plan? Lifestyle  Cook and eat meals together with your family, when possible. Drink enough fluid to keep your urine clear or pale yellow. Be physically active every day. This includes: Aerobic exercise like running or swimming. Leisure activities like gardening, walking, or housework. Get 7-8 hours of sleep each night. If recommended by your health care provider, drink  red wine in moderation. This means 1 glass a day for nonpregnant women and 2 glasses a day for men. A glass of wine equals 5 oz (150 mL). Reading food labels  Check the serving size of packaged foods. For foods such as rice and pasta, the serving size refers to the amount of cooked product, not dry. Check the total fat in packaged foods. Avoid foods that have saturated fat or trans  fats. Check the ingredients list for added sugars, such as corn syrup. Shopping  At the grocery store, buy most of your food from the areas near the walls of the store. This includes: Fresh fruits and vegetables (produce). Grains, beans, nuts, and seeds. Some of these may be available in unpackaged forms or large amounts (in bulk). Fresh seafood. Poultry and eggs. Low-fat dairy products. Buy whole ingredients instead of prepackaged foods. Buy fresh fruits and vegetables in-season from local farmers markets. Buy frozen fruits and vegetables in resealable bags. If you do not have access to quality fresh seafood, buy precooked frozen shrimp or canned fish, such as tuna, salmon, or sardines. Buy small amounts of raw or cooked vegetables, salads, or olives from the deli or salad bar at your store. Stock your pantry so you always have certain foods on hand, such as olive oil, canned tuna, canned tomatoes, rice, pasta, and beans. Cooking  Cook foods with extra-virgin olive oil instead of using butter or other vegetable oils. Have meat as a side dish, and have vegetables or grains as your main dish. This means having meat in small portions or adding small amounts of meat to foods like pasta or stew. Use beans or vegetables instead of meat in common dishes like chili or lasagna. Experiment with different cooking methods. Try roasting or broiling vegetables instead of steaming or sauteing them. Add frozen vegetables to soups, stews, pasta, or rice. Add nuts or seeds for added healthy fat at each meal. You can add these to yogurt, salads, or vegetable dishes. Marinate fish or vegetables using olive oil, lemon juice, garlic, and fresh herbs. Meal planning  Plan to eat 1 vegetarian meal one day each week. Try to work up to 2 vegetarian meals, if possible. Eat seafood 2 or more times a week. Have healthy snacks readily available, such as: Vegetable sticks with hummus. Greek yogurt. Fruit and nut  trail mix. Eat balanced meals throughout the week. This includes: Fruit: 2-3 servings a day Vegetables: 4-5 servings a day Low-fat dairy: 2 servings a day Fish, poultry, or lean meat: 1 serving a day Beans and legumes: 2 or more servings a week Nuts and seeds: 1-2 servings a day Whole grains: 6-8 servings a day Extra-virgin olive oil: 3-4 servings a day Limit red meat and sweets to only a few servings a month What are my food choices? Mediterranean diet Recommended Grains: Whole-grain pasta. Brown rice. Bulgar wheat. Polenta. Couscous. Whole-wheat bread. [place]. Vegetables: Artichokes. Beets. Broccoli. Cabbage. Carrots. Eggplant. Green beans. Chard. Kale. Spinach. Onions. Leeks. Peas. Squash. Tomatoes. Peppers. Radishes. Fruits: Apples. Apricots. Avocado. Berries. Bananas. Cherries. Dates. Figs. Grapes. Lemons. Melon. Oranges. Peaches. Plums. Pomegranate. Meats and other protein foods: Beans. Almonds. Sunflower seeds. Pine nuts. Peanuts. [place]. Salmon. Scallops. Shrimp. [place]. Tilapia. Clams. Oysters. Eggs. Dairy: Low-fat milk. Cheese. Greek yogurt. Beverages: Water. Red wine. Herbal tea. Fats and oils: Extra virgin olive oil. Avocado oil. Grape seed oil. Sweets and desserts: [place] yogurt with honey. Baked apples. Poached pears. Trail mix. Seasoning and other foods: Basil. Cilantro. Coriander.  Cumin. Mint. Parsley. Sage. Rosemary. Tarragon. Garlic. Oregano. Thyme. Pepper. Balsalmic vinegar. Tahini. Hummus. Tomato sauce. Olives. Mushrooms. Limit these Grains: Prepackaged pasta or rice dishes. Prepackaged cereal with added sugar. Vegetables: Deep fried potatoes (french fries). Fruits: Fruit canned in syrup. Meats and other protein foods: Beef. Pork. Lamb. Poultry with skin. Hot dogs. [place] Salines. Dairy: Ice cream. Sour cream. Whole milk. Beverages: Juice. Sugar-sweetened soft drinks. Beer. Liquor and spirits. Fats and oils: Butter. Canola oil. Vegetable oil. Beef fat (tallow).  Lard. Sweets and desserts: Cookies. Cakes. Pies. Candy. Seasoning and other foods: Mayonnaise. Premade sauces and marinades. The items listed may not be a complete list. Talk with your dietitian about what dietary choices are right for you. Summary The Mediterranean diet includes both food and lifestyle choices. Eat a variety of fresh fruits and vegetables, beans, nuts, seeds, and whole grains. Limit the amount of red meat and sweets that you eat. Talk with your health care provider about whether it is safe for you to drink red wine in moderation. This means 1 glass a day for nonpregnant women and 2 glasses a day for men. A glass of wine equals 5 oz (150 mL). This information is not intended to replace advice given to you by your health care provider. Make sure you discuss any questions you have with your health care provider. Document Released: 02/10/2016 Document Revised: 03/14/2016 Document Reviewed: 02/10/2016 Elsevier Interactive Patient Education  2017 [employer].

## 2022-01-23 NOTE — Progress Notes (Addendum)
Assessment/Plan:     The patient is seen in neurologic consultation at the request of Janith Lima, MD for the evaluation of memory.  Charlene Patterson is a very pleasant 76 y.o. year old RH female with  a history of hypertension, hyperlipidemia, anxiety, depression, CKD stage III, osteoarthritis, seen today for evaluation of memory loss. MoCA today is 19/30, suggestive of mild dementia, likely due to Alzheimer's disease, with a component of depression.  Patient had been seen in the past, around Sep 29, 2014, and underwent neurocognitive testing on December 2017 largely within normal limits.  Cognitive decline is obvious during this visit.    Recommendations:   Dementia due to Alzheimer's Disease  MRI brain without contrast to assess for underlying structural abnormality and assess vascular load  Check B12 Start donepezil 10 mg, take half dose for 2 weeks, then increase to 1 tablet daily if tolerated. Follow mood control as per PCP, may benefit from psychotherapy [place] up in 3 months  Subjective:     The patient is here alone.   How long did patient have memory difficulties?  "About 7 years, I feel that my memory is worse, I constantly have to remember conversations and keeping appointments ".  She continues to try to do crossword puzzles and word finding, although these has become more difficult.  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 Patient lives with: Her son who has cerebral palsy.  She also lives with her grandson and his wife. Repeats oneself?  Endorsed Disoriented when walking into a room?  Patient denies   Leaving objects in unusual places?  She reports losing objects constantly, and recently she has been leaving her phone "everywhere, including under the food in my car ". Ambulates  with difficulty?   Patient denies, but "I feel that my body is out of shape ". Recent falls?  Patient denies   Any head injuries?   She reports having fallen from the roof when she was a child hitting her head ".  No loss of consciousness " History of seizures?   Patient denies   Wandering behavior?  Patient denies   Patient drives?  She drives short distances, and uses his GPS to drive.  Any mood changes?  Denies Any history of depression?:  Endorsed.  Her husband died in September 29, 2010, and she reports that has never recovered despite using good antidepressants.  She never has done any psychotherapy. Hallucinations?  Patient denies   Paranoia?  Patient denies   Patient reports that has intermittent insomnia, reports vivid dreams (not frightening), no REM behavior  History of sleep apnea?  Patient denies   Any hygiene concerns?  Patient denies   Independent of bathing and dressing?  Endorsed  Does the patient needs help with medications? Son monitors but she has them in a pillbox because otherwise she forgets to take them. Who is in charge of the finances? Grandson  Any changes in appetite?  Patient denies   Patient have trouble swallowing? Patient denies   Does the patient cook?  Patient denies   Any kitchen accidents such as leaving the stove on? Patient denies   Any headaches?  Patient denies   Double vision? Patient denies   Any focal numbness or tingling?  Patient denies   Chronic back pain Patient denies   Unilateral weakness?  Patient denies   Any tremors?  Patient denies   Any history of anosmia?  Denies  Any incontinence of urine?  Patient denies   Any bowel dysfunction?   Patient denies History of heavy alcohol intake?  Patient denies   History of heavy tobacco use?  Patient denies   Family history of dementia?  Denies  Initial visit 01/05/2018, Dr. Delice Lesch I had the pleasure of seeing Charlene Patterson in follow-up in the neurology clinic on 01/05/2017.  The patient was last seen 9 months ago for worsening memory. MMSE in 04/2016 and 06/2015 were 30/30. She continued to report cognitive changes, including getting lost  driving, missing bills and medications. She underwent Neuropsychological evaluation last December 2017 which was largely within normal limits. There was no evidence of a neurocognitive disorder. She endorsed significant anxiety and depression, cognitive complaints most likely due to these.    Since her last visit, she reports that things have leveled off, everything has been going great. She denies getting lost driving, no missed bills or medications. She has been keeping active in her garden, which is helping her mind as well. She reports less stress. She reports migraines are under control. No dizziness, vision changes, focal numbness/tingling/weakness, no falls. Back pain better with PT.    HPI 06/18/2015: This is a 76 yo RH woman with a history of hyperlipidemia, chronic back pain,who presented for worsening memory. She reports that her memory is "not too good," and started noticing changes gradually over the past few years. She reports problems mostly with short-term memory, sometimes she forgets to take her medications and her son would remind her. She misplaces things frequently and has been told that she repeats herself. She left the stove on a few times. She has to write everything down. She denies getting lost driving, and reports she can find her way back if she makes a wrong turn. She lives with her son with cerebral palsy, and denies any missed bill payments. They are planning to move in with her grandson's family soon.    She has a history of migraines since childhood, worse with weather changes and loud sounds and smells. She has occasional dizziness and gets motion sick quickly. She denies any diplopia, blurred vision, dysarthria, dysphagia. She has right hand and arm numbness and tingling when driving. She has chronic neck and back pain, at times triggering her headaches. She has problems with her rectum, but denies any constipation. No anosmia or tremors. She became tearful reporting that her  husband passed away in 10/13/10, and reports mood is "up and down." She denies any history of head injuries or alcohol use. Her younger sister has memory problems and got lost driving at age 59.   Records and images were personally reviewed where available.  I personally reviewed MRI brain without contrast which was normal. TSH and B12 normal.  No Known Allergies  Current Outpatient Medications  Medication Instructions   acetaminophen (TYLENOL) 650 mg, Every 8 hours PRN   atorvastatin (LIPITOR) 40 MG tablet TAKE 1 TABLET EVERY DAY   Calcium-Magnesium-Vitamin D (CALCIUM 1200+D3 PO) 2 capsules, Daily   cyanocobalamin 1,000 mcg, Oral, Daily   donepezil (ARICEPT) 10 MG tablet Take half tablet (5 mg) daily for 2 weeks, then increase to the full tablet at 10 mg daily.   DULoxetine (CYMBALTA) 30 MG capsule TAKE 1 CAPSULE EVERY DAY   Ergocalciferol (DRISDOL PO) 3,000 Units, Oral, Daily   famotidine (PEPCID) 20 MG tablet TAKE 1 TABLET(20 MG) BY MOUTH AT BEDTIME   pantoprazole (PROTONIX) 40 mg, Oral, Daily   Potassium 99 MG  TABS Oral   VASCEPA 1 g capsule TAKE 2 CAPSULES TWICE DAILY   Zinc 25 mg, Oral     VITALS:   Vitals:   01/23/22 0951  BP: 135/80  Pulse: 77  SpO2: 98%  Weight: 130 lb (59 kg)  Height: '5\' 2"'$  (1.575 m)     PHYSICAL EXAM   HEENT:  Normocephalic, atraumatic. The mucous membranes are moist. The superficial temporal arteries are without ropiness or tenderness. Cardiovascular: Regular rate and rhythm. Lungs: Clear to auscultation bilaterally. Neck: There are no carotid bruits noted bilaterally.  NEUROLOGICAL:    01/23/2022   12:00 PM  Montreal Cognitive Assessment   Visuospatial/ Executive (0/5) 2  Naming (0/3) 3  Attention: Read list of digits (0/2) 2  Attention: Read list of letters (0/1) 1  Attention: Serial 7 subtraction starting at 100 (0/3) 2  Language: Repeat phrase (0/2) 1  Language : Fluency (0/1) 0  Abstraction (0/2) 0  Delayed Recall (0/5) 2   Orientation (0/6) 6  Total 19  Adjusted Score (based on education) 19       09/02/2018    1:45 PM 08/27/2017    2:37 PM 04/17/2016    4:00 PM  MMSE - Mini Mental State Exam  Orientation to time '5 5 5  '$ Orientation to Place '5 5 5  '$ Registration '3 3 3  '$ Attention/ Calculation '5 5 5  '$ Recall '3 2 3  '$ Language- name 2 objects '2 2 2  '$ Language- repeat '1 1 1  '$ Language- follow 3 step command '3 3 3  '$ Language- read & follow direction '1 1 1  '$ Write a sentence '1 1 1  '$ Copy design '1 1 1  '$ Total score '30 29 30     '$ Orientation:  Alert and oriented to person, place and time.  Flat affect.  No aphasia or dysarthria. Fund of knowledge is appropriate. Recent memory impaired and remote memory intact.  Attention and concentration are normal.  Able to name objects and repeat phrases. Delayed recall 2/5.  Language is affected, she repeated several words when asked to provide words that start with the letter F.   Cranial nerves: There is good facial symmetry. Extraocular muscles are intact and visual fields are full to confrontational testing. Speech is fluent and clear but deviates from the tangent when asked a certain topic, and sitting something completely different.. Soft palate rises symmetrically and there is no tongue deviation. Hearing is intact to conversational tone. Tone: Tone is good throughout. Sensation: Sensation is intact to light touch and pinprick throughout. Vibration is intact at the bilateral big toe.There is no extinction with double simultaneous stimulation. There is no sensory dermatomal level identified. Coordination: The patient has no difficulty with RAM's or FNF bilaterally. Normal finger to nose  Motor: Strength is 5/5 in the bilateral upper and lower extremities. There is no pronator drift. There are no fasciculations noted. DTR's: Deep tendon reflexes are 2/4 at the bilateral biceps, triceps, brachioradialis, patella and achilles.  Plantar responses are downgoing bilaterally. Gait and  Station: The patient is able to ambulate without difficulty.The patient is able to heel toe walk without any difficulty.The patient is able to ambulate in a tandem fashion. The patient is able to stand in the Romberg position.    Thank you for allowing [place] the opportunity to participate in the care of this nice patient. Please do not hesitate to contact us for any questions or concerns.   Total time spent on today's visit was 53 minutes dedicated  to this patient today, preparing to see patient, examining the patient, ordering tests and/or medications and counseling the patient, documenting clinical information in the EHR or other health record, independently interpreting results and communicating results to the patient/family, discussing treatment and goals, answering patient's questions and coordinating care.  Cc:  Janith Lima, MD  Sharene Butters 01/23/2022 12:41 PM

## 2022-01-25 NOTE — Telephone Encounter (Signed)
Key: [id number] - PA Case ID: [id number] Valid 01/19/22-07/02/22

## 2022-01-25 NOTE — Telephone Encounter (Signed)
Pt ready for scheduling on or after 01/19/22  Out-of-pocket cost due at time of visit: $301  Primary: [employer] Adv [employer] co-insurance: 20% (approximately $276) Admin fee co-insurance: 20% (approximately $25)  Secondary: n/a [employer] co-insurance:  Admin fee co-insurance:   Deductible: does not apply  Prior Auth: APPROVED Key: [id number] - PA Case ID: [id number] Valid 01/19/22-07/02/22  ** This summary of benefits is an estimation of the patient's out-of-pocket cost. Exact cost may vary based on individual plan coverage.

## 2022-02-03 ENCOUNTER — Ambulatory Visit
Admission: RE | Admit: 2022-02-03 | Discharge: 2022-02-03 | Disposition: A | Discharge status: Home / Self Care | Payer: Medicare HMO | Source: Ambulatory Visit | Attending: Physician Assistant | Admitting: Physician Assistant

## 2022-02-03 DIAGNOSIS — R519 Headache, unspecified: Secondary | ICD-10-CM | POA: Diagnosis not present

## 2022-02-03 DIAGNOSIS — R413 Other amnesia: Secondary | ICD-10-CM

## 2022-02-06 NOTE — Progress Notes (Signed)
No answer again.02/06/2022 at 9:57am

## 2022-02-14 DIAGNOSIS — F4323 Adjustment disorder with mixed anxiety and depressed mood: Secondary | ICD-10-CM | POA: Diagnosis not present

## 2022-02-25 ENCOUNTER — Other Ambulatory Visit: Payer: Self-pay | Admitting: Internal Medicine

## 2022-02-25 DIAGNOSIS — K219 Gastro-esophageal reflux disease without esophagitis: Secondary | ICD-10-CM

## 2022-03-08 ENCOUNTER — Telehealth: Payer: Self-pay | Admitting: Internal Medicine

## 2022-03-08 NOTE — Telephone Encounter (Signed)
Patient is requesting RX for oxyCODONE (OXY IR/ROXICODONE) 5 MG immediate release tablet   States that she has not had to take this in a couple of years but she is having occasional bouts of pain and she would like new RX for this. I advised patient that she might need an office visit for this but she states that she was seen 01/10/22 and she talked about her pain at that visit. Next OV is due in January but not yet scheduled. Patient's call back number is [phone].  She would like RX sent to [place], [place]

## 2022-03-09 ENCOUNTER — Other Ambulatory Visit: Payer: Self-pay | Admitting: Internal Medicine

## 2022-03-09 DIAGNOSIS — M4802 Spinal stenosis, cervical region: Secondary | ICD-10-CM

## 2022-03-09 DIAGNOSIS — M48061 Spinal stenosis, lumbar region without neurogenic claudication: Secondary | ICD-10-CM

## 2022-03-09 MED ORDER — OXYCODONE HCL 5 MG PO TABS: 5.0000 mg | ORAL_TABLET | Freq: Three times a day (TID) | ORAL | 0 refills | Status: DC | PRN

## 2022-03-16 ENCOUNTER — Other Ambulatory Visit: Payer: Self-pay | Admitting: Internal Medicine

## 2022-03-16 NOTE — Telephone Encounter (Signed)
Patient called back requesting her oxycodone

## 2022-03-22 ENCOUNTER — Encounter (HOSPITAL_COMMUNITY): Payer: Self-pay

## 2022-03-22 ENCOUNTER — Emergency Department (HOSPITAL_COMMUNITY): Payer: Medicare HMO

## 2022-03-22 ENCOUNTER — Emergency Department (HOSPITAL_COMMUNITY)
Admission: EM | Admit: 2022-03-22 | Discharge: 2022-03-22 | Disposition: A | Discharge status: Home / Self Care | Payer: Medicare HMO | Attending: Emergency Medicine | Admitting: Emergency Medicine

## 2022-03-22 ENCOUNTER — Telehealth: Payer: Self-pay | Admitting: Physician Assistant

## 2022-03-22 ENCOUNTER — Other Ambulatory Visit: Payer: Self-pay

## 2022-03-22 DIAGNOSIS — G9389 Other specified disorders of brain: Secondary | ICD-10-CM | POA: Diagnosis not present

## 2022-03-22 DIAGNOSIS — W109XXA Fall (on) (from) unspecified stairs and steps, initial encounter: Secondary | ICD-10-CM | POA: Insufficient documentation

## 2022-03-22 DIAGNOSIS — R9431 Abnormal electrocardiogram [ECG] [EKG]: Secondary | ICD-10-CM | POA: Insufficient documentation

## 2022-03-22 DIAGNOSIS — M545 Low back pain, unspecified: Secondary | ICD-10-CM | POA: Diagnosis not present

## 2022-03-22 DIAGNOSIS — F0390 Unspecified dementia without behavioral disturbance: Secondary | ICD-10-CM | POA: Insufficient documentation

## 2022-03-22 DIAGNOSIS — M25551 Pain in right hip: Secondary | ICD-10-CM | POA: Diagnosis not present

## 2022-03-22 DIAGNOSIS — R55 Syncope and collapse: Secondary | ICD-10-CM | POA: Diagnosis not present

## 2022-03-22 DIAGNOSIS — W19XXXA Unspecified fall, initial encounter: Secondary | ICD-10-CM

## 2022-03-22 DIAGNOSIS — M25521 Pain in right elbow: Secondary | ICD-10-CM | POA: Diagnosis not present

## 2022-03-22 DIAGNOSIS — M4802 Spinal stenosis, cervical region: Secondary | ICD-10-CM | POA: Diagnosis not present

## 2022-03-22 LAB — CBC
HCT: 39.2 % (ref 36.0–46.0)
Hemoglobin: 12.9 g/dL (ref 12.0–15.0)
MCH: 30.1 pg (ref 26.0–34.0)
MCHC: 32.9 g/dL (ref 30.0–36.0)
MCV: 91.6 fL (ref 80.0–100.0)
Platelets: 296 10*3/uL (ref 150–400)
RBC: 4.28 MIL/uL (ref 3.87–5.11)
RDW: 14.3 % (ref 11.5–15.5)
WBC: 6.5 10*3/uL (ref 4.0–10.5)
nRBC: 0 % (ref 0.0–0.2)

## 2022-03-22 LAB — URINALYSIS, ROUTINE W REFLEX MICROSCOPIC
Bilirubin Urine: NEGATIVE
Glucose, UA: NEGATIVE mg/dL
Hgb urine dipstick: NEGATIVE
Ketones, ur: NEGATIVE mg/dL
Leukocytes,Ua: NEGATIVE
Nitrite: NEGATIVE
Protein, ur: NEGATIVE mg/dL
Specific Gravity, Urine: 1.026 (ref 1.005–1.030)
pH: 5 (ref 5.0–8.0)

## 2022-03-22 LAB — MAGNESIUM: Magnesium: 2.4 mg/dL (ref 1.7–2.4)

## 2022-03-22 LAB — BASIC METABOLIC PANEL
Anion gap: 5 (ref 5–15)
BUN: 14 mg/dL (ref 8–23)
CO2: 25 mmol/L (ref 22–32)
Calcium: 9.3 mg/dL (ref 8.9–10.3)
Chloride: 109 mmol/L (ref 98–111)
Creatinine, Ser: 0.97 mg/dL (ref 0.44–1.00)
GFR, Estimated: >60 mL/min (ref >60)
Glucose, Bld: 91 mg/dL (ref 70–99)
Potassium: 4 mmol/L (ref 3.5–5.1)
Sodium: 139 mmol/L (ref 135–145)

## 2022-03-22 LAB — TROPONIN I (HIGH SENSITIVITY): Troponin I (High Sensitivity): 4 ng/L (ref <18)

## 2022-03-22 LAB — CBG MONITORING, ED: Glucose-Capillary: 84 mg/dL (ref 70–99)

## 2022-03-22 MED ORDER — LIDOCAINE 4 % EX PTCH: 1.0000 | MEDICATED_PATCH | CUTANEOUS | 0 refills | Status: DC

## 2022-03-22 MED ORDER — GADOPICLENOL 0.5 MMOL/ML IV SOLN
5.0000 mL | Freq: Once | INTRAVENOUS | Status: AC | PRN
  Administered 2022-03-22: 5 mL via INTRAVENOUS

## 2022-03-22 MED ORDER — SODIUM CHLORIDE 0.9 % IV BOLUS
1000.0000 mL | Freq: Once | INTRAVENOUS | Status: AC
  Administered 2022-03-22: 1000 mL via INTRAVENOUS

## 2022-03-22 NOTE — ED Provider Triage Note (Signed)
Emergency Medicine Provider Triage Evaluation Note  Charlene Patterson , a 76 y.o. female  was evaluated in triage.  Pt complains of syncope. Had mechanical fall yesterday afternoon while walking down the steps.  Hits R elbow and R hip.  Today report feeling lightheadedness and fainted while in the kitchen.  Denies headache, neck pain, cp, sob, abd pain, dysuria  Review of Systems  Positive: As above Negative: As above  Physical Exam  BP 131/69 (BP Location: Right Arm)   Pulse 76   Temp 98.4 F (36.9 C) (Oral)   Resp (!) 21   Ht '5\' 2"'$  (1.575 m)   Wt 56.7 kg   SpO2 98%   BMI 22.86 kg/m  Gen:   Awake, no distress   Resp:  Normal effort  MSK:   Moves extremities without difficulty  Other:    Medical Decision Making  Medically screening exam initiated at 1:55 PM.  Appropriate orders placed.  [name] was informed that the remainder of the evaluation will be completed by another provider, this initial triage assessment does not replace that evaluation, and the importance of remaining in the ED until their evaluation is complete.     Domenic Moras, PA-C 03/22/22 1356

## 2022-03-22 NOTE — Telephone Encounter (Signed)
Patient's grandson in law, Charlene Patterson's wife Charlene Patterson called requesting a letter of competency for the patient.   She is aware no DPR on file to share PHI with her. Okay to call the grandson back about this.

## 2022-03-22 NOTE — ED Notes (Signed)
Called lab regarding adding on troponin to previous collected labs. Per lab, they will add on.

## 2022-03-22 NOTE — ED Triage Notes (Addendum)
Pt came in POV d/t falling yesterday d/t slipping on a stairs & sliding down "2 or 3 steps" & landing on her Rt side. A/Ox4. Pt denies any deformities since the fall & soreness but still able to ambulate as usual with no assistance. Pts family at bedside reports she had a syncopal episode this morning. Pt explains that she was sweating & felt nauseous so she sat down, once she felt good enough to walk again she stood to go to the couch & then woke up on the floor. Denies any injury from the fall d/t this mornings syncopal episode either.

## 2022-03-22 NOTE — ED Provider Notes (Signed)
[facility]GENC[facility] DEPARTMENT Provider Note   CSN: [id number] Arrival date & time: 03/22/22  1201     History Chief Complaint  Patient presents with   Fall   Syncopal Event    Charlene Patterson is a 76 y.o. female history of dementia, hyperlipidemia, lower GI bleeding, presents to the emergency department for evaluation of lower right back pain and syncopal event.  Yesterday, patient reports that she was carrying a load of laundry down the stairs when she slipped and fell and landed on her right lower back.  She reports that she was able to lift herself up by the handrail and was ambulatory afterwards however did have some pain.  She denies any urinary incontinence, fecal incontinence, saddle anesthesia, fevers, or history of IV drug use.  She denies any abdominal pain.  Earlier today, patient reports that she was doing a load of towels when she started to feel lightheaded, diaphoretic, and nauseous.  She reports that she sat down at the kitchen table and was going to move to the couch, patient reports that the last thing she remembers.  Granddaughter at bedside reports that her mom was the 1 that lifted her off of the floor.  Patient denies any chest pain, shortness of breath, palpitations.  She reports she has a typical headache for herself today.  She denies any unilateral weakness.  Denies any trouble speaking or swallowing.  Denies any room spinning sensation.  She denies any melena or any analgesia.  She reports that she feels fine other than her right lower back pain still.  Recent medication change, patient's increase in her mirtazapine from 7.5-15 at night.    Fall Pertinent negatives include no chest pain, no abdominal pain and no shortness of breath.       Home Medications Prior to Admission medications   Medication Sig Start Date End Date Taking? Authorizing Provider  acetaminophen (TYLENOL) 650 MG CR tablet Take 650 mg by mouth every 8 (eight) hours as  needed for pain.    [provider]  atorvastatin (LIPITOR) 40 MG tablet TAKE 1 TABLET EVERY DAY 01/06/22   Janith Lima, MD  Calcium-Magnesium-Vitamin D (CALCIUM 1200+D3 PO) Take 2 capsules by mouth daily.    [provider]  cyanocobalamin 1000 MCG tablet Take 1,000 mcg by mouth daily.    [provider]  donepezil (ARICEPT) 10 MG tablet Take half tablet (5 mg) daily for 2 weeks, then increase to the full tablet at 10 mg daily. 01/23/22   Rondel Jumbo, PA-C  DULoxetine (CYMBALTA) 30 MG capsule TAKE 1 CAPSULE EVERY DAY 10/10/21   Janith Lima, MD  Ergocalciferol (DRISDOL PO) Take 3,000 Units by mouth daily.    [provider]  famotidine (PEPCID) 20 MG tablet TAKE 1 TABLET(20 MG) BY MOUTH AT BEDTIME 09/19/21   Nandigam, Venia Minks, MD  oxyCODONE (OXY IR/ROXICODONE) 5 MG immediate release tablet Take 1 tablet (5 mg total) by mouth every 8 (eight) hours as needed for severe pain. 03/09/22   Janith Lima, MD  pantoprazole (PROTONIX) 40 MG tablet TAKE 1 TABLET EVERY DAY 02/25/22   Janith Lima, MD  Potassium 99 MG TABS Take by mouth.    [provider]  VASCEPA 1 g capsule TAKE 2 CAPSULES TWICE DAILY 05/17/21   Janith Lima, MD  Zinc 50 MG TABS Take 25 mg by mouth.    [provider]      Allergies  Patient has no known allergies.    Review of Systems   Review of Systems  Constitutional:  Positive for diaphoresis. Negative for chills and fever.  Respiratory:  Negative for cough and shortness of breath.   Cardiovascular:  Negative for chest pain and palpitations.  Gastrointestinal:  Positive for nausea. Negative for abdominal pain, anal bleeding, blood in stool, constipation, diarrhea and vomiting.  Genitourinary:  Negative for dysuria and hematuria.  Musculoskeletal:  Positive for back pain. Negative for neck pain.  Neurological:  Positive for syncope, weakness and light-headedness. Negative for speech difficulty.    Physical  Exam Updated Vital Signs BP (!) 147/68   Pulse 61   Temp 97.6 F (36.4 C) (Oral)   Resp 15   Ht '5\' 2"'$  (1.575 m)   Wt 56.7 kg   SpO2 100%   BMI 22.86 kg/m  Physical Exam Vitals and nursing note reviewed.  Constitutional:      General: She is not in acute distress.    Appearance: Normal appearance. She is not ill-appearing or toxic-appearing.  HENT:     Head: Normocephalic and atraumatic.  Eyes:     General: No scleral icterus.    Extraocular Movements: Extraocular movements intact.     Pupils: Pupils are equal, round, and reactive to light.  Cardiovascular:     Rate and Rhythm: Normal rate and regular rhythm.  Pulmonary:     Effort: Pulmonary effort is normal.     Breath sounds: Normal breath sounds.  Abdominal:     General: Bowel sounds are normal. There is no distension.     Palpations: Abdomen is soft.     Tenderness: There is no abdominal tenderness. There is no guarding or rebound.  Musculoskeletal:        General: No deformity.     Cervical back: Normal range of motion. No tenderness.     Comments: No midline cervical, thoracic, or lumbar tenderness.  Patient does have some lower lateral/right paraspinal lumbar tenderness palpation.  No overlying skin changes noted.  No step-offs or deformities.  No increased erythema or warmth noted to the area.  There are some tenderness noted to her right elbow.  She has a small bruise noted to the more distal aspect.  Full range of motion.  No snuffbox tenderness.  Cap refill brisk.  Compartments are soft.  No other extremity tenderness.  Skin:    General: Skin is warm and dry.  Neurological:     General: No focal deficit present.     Mental Status: She is alert. Mental status is at baseline.     GCS: GCS eye subscore is 4. GCS verbal subscore is 5. GCS motor subscore is 6.     Cranial Nerves: Cranial nerves 2-12 are intact. No dysarthria or facial asymmetry.     Sensory: No sensory deficit.     Motor: No weakness or pronator  drift.     Comments: Patient is answering questions appropriately with appropriate speech.  No facial asymmetry noted.  Cranial nerves II through XII intact.  No sensation deficit.  No pronator drift.  Patient has equal strength in upper and lower bilateral extremities.  Compartments are soft.     ED Results / Procedures / Treatments   Labs (all labs ordered are listed, but only abnormal results are displayed) Labs Reviewed  BASIC METABOLIC PANEL  CBC  URINALYSIS, ROUTINE W REFLEX MICROSCOPIC  MAGNESIUM  CBG MONITORING, ED  TROPONIN I (HIGH SENSITIVITY)    EKG EKG  Interpretation  Date/Time:  Wednesday March 22 2022 16:01:16 EDT Ventricular Rate:  60 PR Interval:  145 QRS Duration: 77 QT Interval:  596 QTC Calculation: 596 R Axis:   18 Text Interpretation: Sinus rhythm Low voltage, precordial leads Nonspecific T abnormalities, anterior leads Prolonged QT interval No old tracing to compare Confirmed by Isla Pence [id number]) on 03/22/2022 4:05:25 PM  Radiology MR BRAIN W WO CONTRAST  Result Date: 03/22/2022 CLINICAL DATA:  Abnormal CT. Rule out mass right frontal lobe. Dementia. Recent fall. EXAM: MRI HEAD WITHOUT AND WITH CONTRAST TECHNIQUE: Multiplanar, multiecho pulse sequences of the brain and surrounding structures were obtained without and with intravenous contrast. CONTRAST:  5 mL Vueway IV COMPARISON:  CT head 03/22/2022. MRI head without contrast 02/03/2022 FINDINGS: Brain: Densely enhancing mass right frontal lobe is noted on CT which is hyperdense. This appears extra-axial and measures 11 x 7 mm compatible with meningioma. No brain edema. 3 mm faint enhancement in the left frontal white matter best seen on sagittal postcontrast image number 18. Mild susceptibility compatible with prior hemorrhage. No other enhancing lesions. No acute infarct. Cerebellar tonsils 4 mm below the foramen magnum compatible with tonsillar ectopia. Vascular: Normal arterial flow voids Skull and  upper cervical spine: Negative Sinuses/Orbits: Paranasal sinuses clear.  Negative orbit Other: None IMPRESSION: 1. Enhancing mass right frontal lobe appears extra-axial in compatible with meningioma. No brain edema 2. Faintly enhancing 3 mm lesion left frontal white matter. Mild associated susceptibility. Appearance is nonspecific. 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 Metastatic disease possible. Follow-up MRI without and with contrast recommended approximately 4 months for stability. If the patient has a history of known malignancy, closer follow-up may be warranted. Electronically Signed   By: Franchot Gallo M.D.   On: 03/22/2022 18:56   CT HEAD WO CONTRAST  Result Date: 03/22/2022 CLINICAL DATA:  Syncope mechanical fall yesterday afternoon while walking down the steps. EXAM: CT HEAD WITHOUT CONTRAST CT CERVICAL SPINE WITHOUT CONTRAST TECHNIQUE: Multidetector CT imaging of the head and cervical spine was performed following the standard protocol without intravenous contrast. Multiplanar CT image reconstructions of the cervical spine were also generated. RADIATION DOSE REDUCTION: This exam was performed according to the departmental dose-optimization program which includes automated exposure control, adjustment of the mA and/or kV according to patient size and/or use of iterative reconstruction technique. COMPARISON:  MRI examination dated February 03, 2022 FINDINGS: CT HEAD FINDINGS Brain: There is a 1 cm area of speckled calcification in the right frontal lobe (series 3 image 24; series 5, image 15-17). No surrounding edema. No mass effect or midline shift. No evidence of acute intracranial infarction. Vascular: No hyperdense vessel or unexpected calcification. Skull: Mild focal hyperostosis adjacent to the above-mentioned mass. No acute abnormality. Sinuses/Orbits: No acute finding. Other: None. CT CERVICAL SPINE FINDINGS Alignment:  Normal. Skull base and vertebrae: No acute fracture. No primary bone lesion or focal pathologic process. Soft tissues and spinal canal: No prevertebral fluid or swelling. No visible canal hematoma. Disc levels: C2-C3:  No significant findings. C3-C4: Disc height loss and uncovertebral joint arthropathy with mild right neural foraminal stenosis. C4-C5: Disc height loss with mild spinal canal stenosis. No significant neural foraminal stenosis. C5-C6:  No significant finding. C6-C7:  No significant findings. C7-T1:  No significant findings. Upper chest: Negative. Other: None IMPRESSION: CT head: 1. 1 cm speckled calcified mass in the right frontal lobe, it which appears to be dural-based and focal hyperostosis of  the calvarium, it likely represents a small meningioma. Differential includes other cortical based tumors. Further evaluation with MRI with and without contrast would be helpful. 2.  No evidence of acute intracranial hemorrhage or infarct. CT cervical spine: 1.  No acute fracture or traumatic subluxation. 2.  Paraspinal soft tissues are unremarkable. 3. Degenerate disc disease prominent at C3-C4 and C4-C5 with uncovertebral joint arthropathy. Mild right neural foraminal stenosis at C3-C4. Findings of 1 cm speckled calcified right frontal lobe mass with follow-up recommendation with MRI within without contrast were reported to Dr. Rona Ravens at approximately 5:30 p.m. on 03/22/2022. Electronically Signed   By: Keane Police D.O.   On: 03/22/2022 17:40   CT Cervical Spine Wo Contrast  Result Date: 03/22/2022 CLINICAL DATA:  Syncope mechanical fall yesterday afternoon while walking down the steps. EXAM: CT HEAD WITHOUT CONTRAST CT CERVICAL SPINE WITHOUT CONTRAST TECHNIQUE: Multidetector CT imaging of the head and cervical spine was performed following the standard protocol without intravenous contrast. Multiplanar CT image reconstructions of the cervical spine were also generated. RADIATION DOSE REDUCTION: This exam  was performed according to the departmental dose-optimization program which includes automated exposure control, adjustment of the mA and/or kV according to patient size and/or use of iterative reconstruction technique. COMPARISON:  MRI examination dated February 03, 2022 FINDINGS: CT HEAD FINDINGS Brain: There is a 1 cm area of speckled calcification in the right frontal lobe (series 3 image 24; series 5, image 15-17). No surrounding edema. No mass effect or midline shift. No evidence of acute intracranial infarction. Vascular: No hyperdense vessel or unexpected calcification. Skull: Mild focal hyperostosis adjacent to the above-mentioned mass. No acute abnormality. Sinuses/Orbits: No acute finding. Other: None. CT CERVICAL SPINE FINDINGS Alignment: Normal. Skull base and vertebrae: No acute fracture. No primary bone lesion or focal pathologic process. Soft tissues and spinal canal: No prevertebral fluid or swelling. No visible canal hematoma. Disc levels: C2-C3:  No significant findings. C3-C4: Disc height loss and uncovertebral joint arthropathy with mild right neural foraminal stenosis. C4-C5: Disc height loss with mild spinal canal stenosis. No significant neural foraminal stenosis. C5-C6:  No significant finding. C6-C7:  No significant findings. C7-T1:  No significant findings. Upper chest: Negative. Other: None IMPRESSION: CT head: 1. 1 cm speckled calcified mass in the right frontal lobe, it which appears to be dural-based and focal hyperostosis of the calvarium, it likely represents a small meningioma. Differential includes other cortical based tumors. Further evaluation with MRI with and without contrast would be helpful. 2.  No evidence of acute intracranial hemorrhage or infarct. CT cervical spine: 1.  No acute fracture or traumatic subluxation. 2.  Paraspinal soft tissues are unremarkable. 3. Degenerate disc disease prominent at C3-C4 and C4-C5 with uncovertebral joint arthropathy. Mild right neural  foraminal stenosis at C3-C4. Findings of 1 cm speckled calcified right frontal lobe mass with follow-up recommendation with MRI within without contrast were reported to Dr. Rona Ravens at approximately 5:30 p.m. on 03/22/2022. Electronically Signed   By: Keane Police D.O.   On: 03/22/2022 17:40   DG Hip Unilat W or Wo Pelvis 2-3 Views Right  Result Date: 03/22/2022 CLINICAL DATA:  Right hip pain after a fall. EXAM: DG HIP (WITH OR WITHOUT PELVIS) 2-3V RIGHT COMPARISON:  None FINDINGS: No pelvic fracture. No hip fracture. No degenerative change or other focal finding. IMPRESSION: Normal radiography. Electronically Signed   By: Nelson Chimes M.D.   On: 03/22/2022 14:41   DG Elbow Complete Right  Result Date: 03/22/2022 CLINICAL  DATA:  Right elbow and hip pain after a fall yesterday. EXAM: RIGHT ELBOW - COMPLETE 3+ VIEW COMPARISON:  None Available. FINDINGS: No visible joint effusion. No evidence of acute fracture. Mild chronic calcification in the region of the common extensor tendon. IMPRESSION: No acute traumatic finding. Electronically Signed   By: Nelson Chimes M.D.   On: 03/22/2022 14:40    Procedures Procedures   Medications Ordered in ED Medications  sodium chloride 0.9 % bolus 1,000 mL (1,000 mLs Intravenous New Bag/Given 03/22/22 1706)  gadopiclenol (VUEWAY) 0.5 MMOL/ML solution 5 mL (5 mLs Intravenous Contrast Given 03/22/22 1822)    ED Course/ Medical Decision Making/ A&P                           Medical Decision Making Amount and/or Complexity of Data Reviewed Labs: ordered. Radiology: ordered.  Risk Prescription drug management.   *** 76 year old female presents the emergency room and for evaluation after fall yesterday and syncopal event today.  Differential diagnosis includes was not limited to dehydration, vasovagal syncope, cardiogenic syncope, GI bleed, electrolyte abnormality, stroke, UTI.  Vital signs are unremarkable.  Physical exam as noted above.  Imaging ordered in  triage.  CT of head and neck shows CT head: 1. 1 cm speckled calcified mass in the right frontal lobe, it which appears to be dural-based and focal hyperostosis of the calvarium, it likely represents a small meningioma. Differential includes other cortical based tumors. Further evaluation with MRI with and without contrast would be helpful. 2.  No evidence of acute intracranial hemorrhage or infarct. CT cervical spine: 1.  No acute fracture or traumatic subluxation. 2.  Paraspinal soft tissues are unremarkable. 3. Degenerate disc disease prominent at C3-C4 and C4-C5 with uncovertebral joint arthropathy. Mild right neural foraminal stenosis at C3-C4. Findings of 1 cm speckled calcified right frontal lobe mass with follow-up recommendation with MRI within without contrast.  Will Order MRI.  Hip x-ray is unremarkable.  X-ray of the right elbow is unremarkable.  I independently reviewed and interpreted the patient's labs. ***.  MRI shows    I discussed this case with my attending physician who cosigned this note including patient's presenting symptoms, physical exam, and planned diagnostics and interventions. Attending physician stated agreement with plan or made changes to plan which were implemented.   Attending physician assessed patient at bedside.   Final Clinical Impression(s) / ED Diagnoses Final diagnoses:  None    Rx / DC Orders ED Discharge Orders     None

## 2022-03-22 NOTE — Discharge Instructions (Addendum)
You were seen here today for evaluation after your syncopal episode.  Your labs were normal, however you did have a prolonged QT syndrome.This may or may not be related to your syncope, but I would like for you to follow up with a cardiologist regardless. If you have not heard from them in the next few days, please call them to schedule an appointment. Additionally, the imaging revealed a spot on your brain. I would like for you to follow up with your neurologist regarding this. Please call them tomorrow to schedule an appointment. Please make sure you are eating and staying well hydrated. For your back, you can apply lidocaine patches as needed. I have sent in a prescription for them, but you can also pick these up OTC. You can take Tylenol or ibuprofen as needed for pain. If you have any concerns, new or worsening symptoms, please return to the nearest ER for re-evaluation.   Contact a doctor if: You have episodes of near fainting. Get help right away if: You pass out or faint. You hit your head or are injured after fainting. You have any of these symptoms: Fast or uneven heartbeats (palpitations). Pain in your chest, belly, or back. Shortness of breath. You have jerky movements that you cannot control (seizure). You have a very bad headache. You are confused. You have problems with how you see (vision). You are very weak. You have trouble walking. You are bleeding from your mouth or your butt (rectum). You have black or tarry poop (stool). These symptoms may be an emergency. Get help right away. Call your local emergency services (911 in the [place] Do not wait to see if the symptoms will go away. Do not drive yourself to the hospital.

## 2022-03-23 NOTE — ED Provider Notes (Incomplete)
[facility] Provider Note   CSN: [id number] Arrival date & time: 03/22/22  1201     History Chief Complaint  Patient presents with  . Fall  . Syncopal Event    Charlene Patterson is a 76 y.o. female history of dementia, hyperlipidemia, lower GI bleeding, presents to the emergency department for evaluation of lower right back pain and syncopal event.  Yesterday, patient reports that she was carrying a load of laundry down the stairs when she slipped and fell and landed on her right lower back.  She reports that she was able to lift herself up by the handrail and was ambulatory afterwards however did have some pain.  She denies any urinary incontinence, fecal incontinence, saddle anesthesia, fevers, or history of IV drug use.  She denies any abdominal pain.  Earlier today, patient reports that she was doing a load of towels when she started to feel lightheaded, diaphoretic, and nauseous.  She reports that she sat down at the kitchen table and was going to move to the couch, patient reports that the last thing she remembers.  Granddaughter at bedside reports that her mom was the 1 that lifted her off of the floor.  Patient denies any chest pain, shortness of breath, palpitations.  She reports she has a typical headache for herself today.  She denies any unilateral weakness.  Denies any trouble speaking or swallowing.  Denies any room spinning sensation.  She denies any melena or any analgesia.  She reports that she feels fine other than her right lower back pain still.  Recent medication change, patient's increase in her mirtazapine from 7.5-15 at night.    Fall Pertinent negatives include no chest pain, no abdominal pain and no shortness of breath.       Home Medications Prior to Admission medications   Medication Sig Start Date End Date Taking? Authorizing Provider  acetaminophen (TYLENOL) 650 MG CR tablet Take 650 mg by mouth every 8 (eight) hours as  needed for pain.    [provider]  atorvastatin (LIPITOR) 40 MG tablet TAKE 1 TABLET EVERY DAY 01/06/22   Janith Lima, MD  Calcium-Magnesium-Vitamin D (CALCIUM 1200+D3 PO) Take 2 capsules by mouth daily.    [provider]  cyanocobalamin 1000 MCG tablet Take 1,000 mcg by mouth daily.    [provider]  donepezil (ARICEPT) 10 MG tablet Take half tablet (5 mg) daily for 2 weeks, then increase to the full tablet at 10 mg daily. 01/23/22   Rondel Jumbo, PA-C  DULoxetine (CYMBALTA) 30 MG capsule TAKE 1 CAPSULE EVERY DAY 10/10/21   Janith Lima, MD  Ergocalciferol (DRISDOL PO) Take 3,000 Units by mouth daily.    [provider]  famotidine (PEPCID) 20 MG tablet TAKE 1 TABLET(20 MG) BY MOUTH AT BEDTIME 09/19/21   Nandigam, Venia Minks, MD  oxyCODONE (OXY IR/ROXICODONE) 5 MG immediate release tablet Take 1 tablet (5 mg total) by mouth every 8 (eight) hours as needed for severe pain. 03/09/22   Janith Lima, MD  pantoprazole (PROTONIX) 40 MG tablet TAKE 1 TABLET EVERY DAY 02/25/22   Janith Lima, MD  Potassium 99 MG TABS Take by mouth.    [provider]  VASCEPA 1 g capsule TAKE 2 CAPSULES TWICE DAILY 05/17/21   Janith Lima, MD  Zinc 50 MG TABS Take 25 mg by mouth.    [provider]      Allergies  Patient has no known allergies.    Review of Systems   Review of Systems  Constitutional:  Positive for diaphoresis. Negative for chills and fever.  Respiratory:  Negative for cough and shortness of breath.   Cardiovascular:  Negative for chest pain and palpitations.  Gastrointestinal:  Positive for nausea. Negative for abdominal pain, anal bleeding, blood in stool, constipation, diarrhea and vomiting.  Genitourinary:  Negative for dysuria and hematuria.  Musculoskeletal:  Positive for back pain. Negative for neck pain.  Neurological:  Positive for syncope, weakness and light-headedness. Negative for speech difficulty.    Physical  Exam Updated Vital Signs BP (!) 147/68   Pulse 61   Temp 97.6 F (36.4 C) (Oral)   Resp 15   Ht '5\' 2"'$  (1.575 m)   Wt 56.7 kg   SpO2 100%   BMI 22.86 kg/m  Physical Exam Vitals and nursing note reviewed.  Constitutional:      General: She is not in acute distress.    Appearance: Normal appearance. She is not ill-appearing or toxic-appearing.  HENT:     Head: Normocephalic and atraumatic.  Eyes:     General: No scleral icterus.    Extraocular Movements: Extraocular movements intact.     Pupils: Pupils are equal, round, and reactive to light.  Cardiovascular:     Rate and Rhythm: Normal rate and regular rhythm.  Pulmonary:     Effort: Pulmonary effort is normal.     Breath sounds: Normal breath sounds.  Abdominal:     General: Bowel sounds are normal. There is no distension.     Palpations: Abdomen is soft.     Tenderness: There is no abdominal tenderness. There is no guarding or rebound.  Musculoskeletal:        General: No deformity.     Cervical back: Normal range of motion. No tenderness.     Comments: No midline cervical, thoracic, or lumbar tenderness.  Patient does have some lower lateral/right paraspinal lumbar tenderness palpation.  No overlying skin changes noted.  No step-offs or deformities.  No increased erythema or warmth noted to the area.  There are some tenderness noted to her right elbow.  She has a small bruise noted to the more distal aspect.  Full range of motion.  No snuffbox tenderness.  Cap refill brisk.  Compartments are soft.  No other extremity tenderness.  Skin:    General: Skin is warm and dry.  Neurological:     General: No focal deficit present.     Mental Status: She is alert. Mental status is at baseline.     GCS: GCS eye subscore is 4. GCS verbal subscore is 5. GCS motor subscore is 6.     Cranial Nerves: Cranial nerves 2-12 are intact. No dysarthria or facial asymmetry.     Sensory: No sensory deficit.     Motor: No weakness or pronator  drift.     Comments: Patient is answering questions appropriately with appropriate speech.  No facial asymmetry noted.  Cranial nerves II through XII intact.  No sensation deficit.  No pronator drift.  Patient has equal strength in upper and lower bilateral extremities.  Compartments are soft.     ED Results / Procedures / Treatments   Labs (all labs ordered are listed, but only abnormal results are displayed) Labs Reviewed  BASIC METABOLIC PANEL  CBC  URINALYSIS, ROUTINE W REFLEX MICROSCOPIC  MAGNESIUM  CBG MONITORING, ED  TROPONIN I (HIGH SENSITIVITY)    EKG EKG  Interpretation  Date/Time:  Wednesday March 22 2022 16:01:16 EDT Ventricular Rate:  60 PR Interval:  145 QRS Duration: 77 QT Interval:  596 QTC Calculation: 596 R Axis:   18 Text Interpretation: Sinus rhythm Low voltage, precordial leads Nonspecific T abnormalities, anterior leads Prolonged QT interval No old tracing to compare Confirmed by Isla Pence [phone]) on 03/22/2022 4:05:25 PM  Radiology MR BRAIN W WO CONTRAST  Result Date: 03/22/2022 CLINICAL DATA:  Abnormal CT. Rule out mass right frontal lobe. Dementia. Recent fall. EXAM: MRI HEAD WITHOUT AND WITH CONTRAST TECHNIQUE: Multiplanar, multiecho pulse sequences of the brain and surrounding structures were obtained without and with intravenous contrast. CONTRAST:  5 mL Vueway IV COMPARISON:  CT head 03/22/2022. MRI head without contrast 02/03/2022 FINDINGS: Brain: Densely enhancing mass right frontal lobe is noted on CT which is hyperdense. This appears extra-axial and measures 11 x 7 mm compatible with meningioma. No brain edema. 3 mm faint enhancement in the left frontal white matter best seen on sagittal postcontrast image number 18. Mild susceptibility compatible with prior hemorrhage. No other enhancing lesions. No acute infarct. Cerebellar tonsils 4 mm below the foramen magnum compatible with tonsillar ectopia. Vascular: Normal arterial flow voids Skull and  upper cervical spine: Negative Sinuses/Orbits: Paranasal sinuses clear.  Negative orbit Other: None IMPRESSION: 1. Enhancing mass right frontal lobe appears extra-axial in compatible with meningioma. No brain edema 2. Faintly enhancing 3 mm lesion left frontal white matter. Mild associated susceptibility. Appearance is nonspecific. 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 Metastatic disease possible. Follow-up MRI without and with contrast recommended approximately 4 months for stability. If the patient has a history of known malignancy, closer follow-up may be warranted. Electronically Signed   By: Franchot Gallo M.D.   On: 03/22/2022 18:56   CT HEAD WO CONTRAST  Result Date: 03/22/2022 CLINICAL DATA:  Syncope mechanical fall yesterday afternoon while walking down the steps. EXAM: CT HEAD WITHOUT CONTRAST CT CERVICAL SPINE WITHOUT CONTRAST TECHNIQUE: Multidetector CT imaging of the head and cervical spine was performed following the standard protocol without intravenous contrast. Multiplanar CT image reconstructions of the cervical spine were also generated. RADIATION DOSE REDUCTION: This exam was performed according to the departmental dose-optimization program which includes automated exposure control, adjustment of the mA and/or kV according to patient size and/or use of iterative reconstruction technique. COMPARISON:  MRI examination dated February 03, 2022 FINDINGS: CT HEAD FINDINGS Brain: There is a 1 cm area of speckled calcification in the right frontal lobe (series 3 image 24; series 5, image 15-17). No surrounding edema. No mass effect or midline shift. No evidence of acute intracranial infarction. Vascular: No hyperdense vessel or unexpected calcification. Skull: Mild focal hyperostosis adjacent to the above-mentioned mass. No acute abnormality. Sinuses/Orbits: No acute finding. Other: None. CT CERVICAL SPINE FINDINGS Alignment:  Normal. Skull base and vertebrae: No acute fracture. No primary bone lesion or focal pathologic process. Soft tissues and spinal canal: No prevertebral fluid or swelling. No visible canal hematoma. Disc levels: C2-C3:  No significant findings. C3-C4: Disc height loss and uncovertebral joint arthropathy with mild right neural foraminal stenosis. C4-C5: Disc height loss with mild spinal canal stenosis. No significant neural foraminal stenosis. C5-C6:  No significant finding. C6-C7:  No significant findings. C7-T1:  No significant findings. Upper chest: Negative. Other: None IMPRESSION: CT head: 1. 1 cm speckled calcified mass in the right frontal lobe, it which appears to be dural-based and focal hyperostosis of  the calvarium, it likely represents a small meningioma. Differential includes other cortical based tumors. Further evaluation with MRI with and without contrast would be helpful. 2.  No evidence of acute intracranial hemorrhage or infarct. CT cervical spine: 1.  No acute fracture or traumatic subluxation. 2.  Paraspinal soft tissues are unremarkable. 3. Degenerate disc disease prominent at C3-C4 and C4-C5 with uncovertebral joint arthropathy. Mild right neural foraminal stenosis at C3-C4. Findings of 1 cm speckled calcified right frontal lobe mass with follow-up recommendation with MRI within without contrast were reported to Dr. Rona Ravens at approximately 5:30 p.m. on 03/22/2022. Electronically Signed   By: Keane Police D.O.   On: 03/22/2022 17:40   CT Cervical Spine Wo Contrast  Result Date: 03/22/2022 CLINICAL DATA:  Syncope mechanical fall yesterday afternoon while walking down the steps. EXAM: CT HEAD WITHOUT CONTRAST CT CERVICAL SPINE WITHOUT CONTRAST TECHNIQUE: Multidetector CT imaging of the head and cervical spine was performed following the standard protocol without intravenous contrast. Multiplanar CT image reconstructions of the cervical spine were also generated. RADIATION DOSE REDUCTION: This exam  was performed according to the departmental dose-optimization program which includes automated exposure control, adjustment of the mA and/or kV according to patient size and/or use of iterative reconstruction technique. COMPARISON:  MRI examination dated February 03, 2022 FINDINGS: CT HEAD FINDINGS Brain: There is a 1 cm area of speckled calcification in the right frontal lobe (series 3 image 24; series 5, image 15-17). No surrounding edema. No mass effect or midline shift. No evidence of acute intracranial infarction. Vascular: No hyperdense vessel or unexpected calcification. Skull: Mild focal hyperostosis adjacent to the above-mentioned mass. No acute abnormality. Sinuses/Orbits: No acute finding. Other: None. CT CERVICAL SPINE FINDINGS Alignment: Normal. Skull base and vertebrae: No acute fracture. No primary bone lesion or focal pathologic process. Soft tissues and spinal canal: No prevertebral fluid or swelling. No visible canal hematoma. Disc levels: C2-C3:  No significant findings. C3-C4: Disc height loss and uncovertebral joint arthropathy with mild right neural foraminal stenosis. C4-C5: Disc height loss with mild spinal canal stenosis. No significant neural foraminal stenosis. C5-C6:  No significant finding. C6-C7:  No significant findings. C7-T1:  No significant findings. Upper chest: Negative. Other: None IMPRESSION: CT head: 1. 1 cm speckled calcified mass in the right frontal lobe, it which appears to be dural-based and focal hyperostosis of the calvarium, it likely represents a small meningioma. Differential includes other cortical based tumors. Further evaluation with MRI with and without contrast would be helpful. 2.  No evidence of acute intracranial hemorrhage or infarct. CT cervical spine: 1.  No acute fracture or traumatic subluxation. 2.  Paraspinal soft tissues are unremarkable. 3. Degenerate disc disease prominent at C3-C4 and C4-C5 with uncovertebral joint arthropathy. Mild right neural  foraminal stenosis at C3-C4. Findings of 1 cm speckled calcified right frontal lobe mass with follow-up recommendation with MRI within without contrast were reported to Dr. Rona Ravens at approximately 5:30 p.m. on 03/22/2022. Electronically Signed   By: Keane Police D.O.   On: 03/22/2022 17:40   DG Hip Unilat W or Wo Pelvis 2-3 Views Right  Result Date: 03/22/2022 CLINICAL DATA:  Right hip pain after a fall. EXAM: DG HIP (WITH OR WITHOUT PELVIS) 2-3V RIGHT COMPARISON:  None FINDINGS: No pelvic fracture. No hip fracture. No degenerative change or other focal finding. IMPRESSION: Normal radiography. Electronically Signed   By: Nelson Chimes M.D.   On: 03/22/2022 14:41   DG Elbow Complete Right  Result Date: 03/22/2022 CLINICAL  DATA:  Right elbow and hip pain after a fall yesterday. EXAM: RIGHT ELBOW - COMPLETE 3+ VIEW COMPARISON:  None Available. FINDINGS: No visible joint effusion. No evidence of acute fracture. Mild chronic calcification in the region of the common extensor tendon. IMPRESSION: No acute traumatic finding. Electronically Signed   By: Nelson Chimes M.D.   On: 03/22/2022 14:40    Procedures Procedures   Medications Ordered in ED Medications  sodium chloride 0.9 % bolus 1,000 mL (1,000 mLs Intravenous New Bag/Given 03/22/22 1706)  gadopiclenol (VUEWAY) 0.5 MMOL/ML solution 5 mL (5 mLs Intravenous Contrast Given 03/22/22 1822)    ED Course/ Medical Decision Making/ A&P                           Medical Decision Making Amount and/or Complexity of Data Reviewed Labs: ordered. Radiology: ordered.  Risk OTC drugs. Prescription drug management.   76 year old female presents the emergency room and for evaluation after fall yesterday and syncopal event today.  Differential diagnosis includes was not limited to dehydration, vasovagal syncope, cardiogenic syncope, GI bleed, electrolyte abnormality, stroke, UTI.  Vital signs are unremarkable.  Physical exam as noted above.  Imaging ordered  in triage.  CT of head and neck shows CT head: 1. 1 cm speckled calcified mass in the right frontal lobe, it which appears to be dural-based and focal hyperostosis of the calvarium, it likely represents a small meningioma. Differential includes other cortical based tumors. Further evaluation with MRI with and without contrast would be helpful. 2.  No evidence of acute intracranial hemorrhage or infarct. CT cervical spine: 1.  No acute fracture or traumatic subluxation. 2.  Paraspinal soft tissues are unremarkable. 3. Degenerate disc disease prominent at C3-C4 and C4-C5 with uncovertebral joint arthropathy. Mild right neural foraminal stenosis at C3-C4. Findings of 1 cm speckled calcified right frontal lobe mass with follow-up recommendation with MRI within without contrast.  Will Order MRI.  Hip x-ray is unremarkable.  X-ray of the right elbow is unremarkable.  I independently reviewed and interpreted the patient's labs. ***.  MRI shows   1. Enhancing mass right frontal lobe appears extra-axial in compatible with meningioma. No brain edema 2. Faintly enhancing 3 mm lesion left frontal white matter. Mild associated susceptibility. Appearance is nonspecific. 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 Metastatic disease possible. Follow-up MRI without and with contrast recommended approximately 4 months for stability. If the patient has a history of known malignancy, closer follow-up may be warranted.    I discussed this case with my attending physician who cosigned this note including patient's presenting symptoms, physical exam, and planned diagnostics and interventions. Attending physician stated agreement with plan or made changes to plan which were implemented.   Attending physician assessed patient at bedside.   Final Clinical Impression(s) / ED Diagnoses Final diagnoses:  None    Rx / DC Orders ED Discharge Orders      None

## 2022-03-23 NOTE — Telephone Encounter (Signed)
Spoke with pt grandson Christia Reading informed him We do not do competency letters, that is a legal term. Recommend contacting  Dr. Anthoney Harada for assessment of decision of mental capacity and competency [phone]

## 2022-03-27 ENCOUNTER — Telehealth: Payer: Self-pay | Admitting: Physician Assistant

## 2022-03-27 NOTE — Telephone Encounter (Signed)
Patient Granddaughter  called and states that patient was seen in the ED last week and they ran a Ct scan and MRI on her and found something, they are not sure if the patient can wait until her oct appt with Clarise Cruz or should she be seen sooner  please call

## 2022-03-28 ENCOUNTER — Telehealth: Payer: Self-pay

## 2022-03-28 NOTE — Telephone Encounter (Signed)
Error

## 2022-03-28 NOTE — Telephone Encounter (Signed)
Patient's granddaughter is calling in asking if Dr.Jones would be willing to write the letter, as she would have to pay out of pocket for the competency letter with Dr.Harber.

## 2022-03-28 NOTE — Telephone Encounter (Signed)
Awaiting to be put on schedule, spoke with caretaker and agreed to bring patient in

## 2022-03-28 NOTE — Telephone Encounter (Signed)
Completed and scheduled

## 2022-03-29 ENCOUNTER — Encounter: Payer: Self-pay | Admitting: Physician Assistant

## 2022-03-29 ENCOUNTER — Ambulatory Visit: Payer: Medicare HMO | Admitting: Physician Assistant

## 2022-03-29 VITALS — BP 120/62 | HR 87 | Resp 20 | Ht 62.0 in | Wt 128.0 lb

## 2022-03-29 DIAGNOSIS — F0280 Dementia in other diseases classified elsewhere without behavioral disturbance: Secondary | ICD-10-CM | POA: Diagnosis not present

## 2022-03-29 DIAGNOSIS — G939 Disorder of brain, unspecified: Secondary | ICD-10-CM | POA: Diagnosis not present

## 2022-03-29 DIAGNOSIS — G309 Alzheimer's disease, unspecified: Secondary | ICD-10-CM | POA: Diagnosis not present

## 2022-03-29 NOTE — Patient Instructions (Addendum)
It was a pleasure to see you today at our office.   Recommendations:  Neurocognitive evaluation at our office in April 2024  MRI of the brain, the radiology office will call you to arrange you appointment Discontinue donepezil  Decrease mirtazapine to 7.5 mg at night  Follow up in 19month   Whom to call:  Memory  decline, memory medications: Call our office [phone]  For psychiatric meds, mood meds: Please have your primary care physician manage these medications.   Counseling regarding caregiver distress, including caregiver depression, anxiety and issues regarding community resources, adult day care programs, adult living facilities, or memory care questions:   Feel free to contact [facility] at [phone]  For assessment of decision of mental capacity and competency:  Call Dr. MAnthoney Harada geriatric psychiatrist at [phone] For guidance in geriatric dementia issues please call [facility] [phone] [place] [address] [phone] Hours of Operation Mondays to Thursdays: 8 am to 8 pm,Fridays: 9 am to 8 pm, Saturdays: 9 am to 1 pm Sundays: Closed  [email or web address]   If you have any severe symptoms of a stroke, or other severe issues such as confusion,severe chills or fever, etc call 911 or go to the ER as you may need to be evaluated further       RECOMMENDATIONS FOR ALL PATIENTS WITH MEMORY PROBLEMS: 1. Continue to exercise (Recommend 30 minutes of walking everyday, or 3 hours every week) 2. Increase social interactions - continue going to [place] enjoy social gatherings with friends and family 3. Eat healthy, avoid fried foods and eat more fruits and vegetables 4. Maintain adequate blood pressure, blood sugar, and blood cholesterol level. Reducing the risk of stroke and  cardiovascular disease also helps promoting better memory. 5. Avoid stressful situations. Live a simple life and avoid aggravations. Organize your time and prepare for the next day in anticipation. 6. Sleep well, avoid any interruptions of sleep and avoid any distractions in the bedroom that may interfere with adequate sleep quality 7. Avoid sugar, avoid sweets as there is a strong link between excessive sugar intake, diabetes, and cognitive impairment We discussed the Mediterranean diet, which has been shown to help patients reduce the risk of progressive memory disorders and reduces cardiovascular risk. This includes eating fish, eat fruits and green leafy vegetables, nuts like almonds and hazelnuts, walnuts, and also use olive oil. Avoid fast foods and fried foods as much as possible. Avoid sweets and sugar as sugar use has been linked to worsening of memory function.  There is always a concern of gradual progression of memory problems. If this is the case, then we may need to adjust level of care according to patient needs. Support, both to the patient and caregiver, should then be put into place.      You have been referred for a neuropsychological evaluation (i.e., evaluation of memory and thinking abilities). Please bring someone with you to this appointment if possible, as it is helpful for the doctor to hear from both you and another adult who knows you well. Please bring eyeglasses and hearing aids if you wear them.    The evaluation will take approximately 3 hours and has two parts:   The first part is a clinical interview with the neuropsychologist (Dr. MMelvyn Novasor Dr. SNicole Kindred. During the interview, the neuropsychologist will speak with you and the individual you brought to the appointment.  The second part of the evaluation is testing with the doctor's technician Hinton Dyer or Maudie Mercury). During the testing, the technician will ask you to remember different types of material, solve problems, and  answer some questionnaires. Your family member will not be present for this portion of the evaluation.   Please note: We must reserve several hours of the neuropsychologist's time and the psychometrician's time for your evaluation appointment. As such, there is a No-Show fee of $100. If you are unable to attend any of your appointments, please contact our office as soon as possible to reschedule.    FALL PRECAUTIONS: Be cautious when walking. Scan the area for obstacles that may increase the risk of trips and falls. When getting up in the mornings, sit up at the edge of the bed for a few minutes before getting out of bed. Consider elevating the bed at the head end to avoid drop of blood pressure when getting up. Walk always in a well-lit room (use night lights in the walls). Avoid area rugs or power cords from appliances in the middle of the walkways. Use a walker or a cane if necessary and consider physical therapy for balance exercise. Get your eyesight checked regularly.  FINANCIAL OVERSIGHT: Supervision, especially oversight when making financial decisions or transactions is also recommended.  HOME SAFETY: Consider the safety of the kitchen when operating appliances like stoves, microwave oven, and blender. Consider having supervision and share cooking responsibilities until no longer able to participate in those. Accidents with firearms and other hazards in the house should be identified and addressed as well.   ABILITY TO BE LEFT ALONE: If patient is unable to contact 911 operator, consider using LifeLine, or when the need is there, arrange for someone to stay with patients. Smoking is a fire hazard, consider supervision or cessation. Risk of wandering should be assessed by caregiver and if detected at any point, supervision and safe proof recommendations should be instituted.  MEDICATION SUPERVISION: Inability to self-administer medication needs to be constantly addressed. Implement a mechanism  to ensure safe administration of the medications.   DRIVING: Regarding driving, in patients with progressive memory problems, driving will be impaired. We advise to have someone else do the driving if trouble finding directions or if minor accidents are reported. Independent driving assessment is available to determine safety of driving.   If you are interested in the driving assessment, you can contact the following:  [employer] in [place]  [place] [phone] or [phone]    [place] refers to food and lifestyle choices that are based on the traditions of countries located on the [employer]. This way of eating has been shown to help prevent certain conditions and improve outcomes for people who have chronic diseases, like kidney disease and heart disease. What are tips for following this plan? Lifestyle  Cook and eat meals together with your family, when possible. Drink enough fluid to keep your urine clear or pale yellow. Be physically active every day. This includes: Aerobic exercise like running or swimming. Leisure activities like gardening, walking, or housework. Get 7-8 hours of sleep each night. If recommended by your health care provider, drink red wine in moderation. This means 1 glass a day for nonpregnant women and 2 glasses a day for men. A glass of wine equals 5 oz (150 mL). Reading food labels  Check the serving size of packaged foods. For  foods such as rice and pasta, the serving size refers to the amount of cooked product, not dry. Check the total fat in packaged foods. Avoid foods that have saturated fat or trans fats. Check the ingredients list for added sugars, such as corn syrup. Shopping  At the grocery store, buy most of your food from the areas near the walls of the store. This includes: Fresh  fruits and vegetables (produce). Grains, beans, nuts, and seeds. Some of these may be available in unpackaged forms or large amounts (in bulk). Fresh seafood. Poultry and eggs. Low-fat dairy products. Buy whole ingredients instead of prepackaged foods. Buy fresh fruits and vegetables in-season from local farmers markets. Buy frozen fruits and vegetables in resealable bags. If you do not have access to quality fresh seafood, buy precooked frozen shrimp or canned fish, such as tuna, salmon, or sardines. Buy small amounts of raw or cooked vegetables, salads, or olives from the deli or salad bar at your store. Stock your pantry so you always have certain foods on hand, such as olive oil, canned tuna, canned tomatoes, rice, pasta, and beans. Cooking  Cook foods with extra-virgin olive oil instead of using butter or other vegetable oils. Have meat as a side dish, and have vegetables or grains as your main dish. This means having meat in small portions or adding small amounts of meat to foods like pasta or stew. Use beans or vegetables instead of meat in common dishes like chili or lasagna. Experiment with different cooking methods. Try roasting or broiling vegetables instead of steaming or sauteing them. Add frozen vegetables to soups, stews, pasta, or rice. Add nuts or seeds for added healthy fat at each meal. You can add these to yogurt, salads, or vegetable dishes. Marinate fish or vegetables using olive oil, lemon juice, garlic, and fresh herbs. Meal planning  Plan to eat 1 vegetarian meal one day each week. Try to work up to 2 vegetarian meals, if possible. Eat seafood 2 or more times a week. Have healthy snacks readily available, such as: Vegetable sticks with hummus. Greek yogurt. Fruit and nut trail mix. Eat balanced meals throughout the week. This includes: Fruit: 2-3 servings a day Vegetables: 4-5 servings a day Low-fat dairy: 2 servings a day Fish, poultry, or lean meat: 1 serving  a day Beans and legumes: 2 or more servings a week Nuts and seeds: 1-2 servings a day Whole grains: 6-8 servings a day Extra-virgin olive oil: 3-4 servings a day Limit red meat and sweets to only a few servings a month What are my food choices? Mediterranean diet Recommended Grains: Whole-grain pasta. Brown rice. Bulgar wheat. Polenta. Couscous. Whole-wheat bread. [place]. Vegetables: Artichokes. Beets. Broccoli. Cabbage. Carrots. Eggplant. Green beans. Chard. Kale. Spinach. Onions. Leeks. Peas. Squash. Tomatoes. Peppers. Radishes. Fruits: Apples. Apricots. Avocado. Berries. Bananas. Cherries. Dates. Figs. Grapes. Lemons. Melon. Oranges. Peaches. Plums. Pomegranate. Meats and other protein foods: Beans. Almonds. Sunflower seeds. Pine nuts. Peanuts. [place]. Salmon. Scallops. Shrimp. [place]. Tilapia. Clams. Oysters. Eggs. Dairy: Low-fat milk. Cheese. Greek yogurt. Beverages: Water. Red wine. Herbal tea. Fats and oils: Extra virgin olive oil. Avocado oil. Grape seed oil. Sweets and desserts: [place] yogurt with honey. Baked apples. Poached pears. Trail mix. Seasoning and other foods: Basil. Cilantro. Coriander. Cumin. Mint. Parsley. Sage. Rosemary. Tarragon. Garlic. Oregano. Thyme. Pepper. Balsalmic vinegar. Tahini. Hummus. Tomato sauce. Olives. Mushrooms. Limit these Grains: Prepackaged pasta or rice dishes. Prepackaged cereal with added sugar. Vegetables: Deep fried potatoes (french fries). Fruits: Fruit canned in  syrup. Meats and other protein foods: Beef. Pork. Lamb. Poultry with skin. Hot dogs. Bernie[place] Salines. Dairy: Ice cream. Sour cream. Whole milk. Beverages: Juice. Sugar-sweetened soft drinks. Beer. Liquor and spirits. Fats and oils: Butter. Canola oil. Vegetable oil. Beef fat (tallow). Lard. Sweets and desserts: Cookies. Cakes. Pies. Candy. Seasoning and other foods: Mayonnaise. Premade sauces and marinades. The items listed may not be a complete list. Talk with your dietitian about what  dietary choices are right for you. Summary The Mediterranean diet includes both food and lifestyle choices. Eat a variety of fresh fruits and vegetables, beans, nuts, seeds, and whole grains. Limit the amount of red meat and sweets that you eat. Talk with your health care provider about whether it is safe for you to drink red wine in moderation. This means 1 glass a day for nonpregnant women and 2 glasses a day for men. A glass of wine equals 5 oz (150 mL). This information is not intended to replace advice given to you by your health care provider. Make sure you discuss any questions you have with your health care provider. Document Released: 02/10/2016 Document Revised: 03/14/2016 Document Reviewed: 02/10/2016 Elsevier Interactive Patient Education  2017 [employer].    We have sent a referral to [facility] for your MRI and they will call you directly to schedule your appointment. They are located at [place]. If you need to contact them directly please call [phone].

## 2022-03-29 NOTE — Progress Notes (Signed)
Assessment/Plan:   Dementia likely due to Alzheimer's Disease  Charlene Patterson is a very pleasant 76 y.o. RH female with  a history of hypertension, hyperlipidemia, anxiety, depression, CKD stage III, osteoarthritis, seen today for evaluation of memory loss. Last MoCA on 12/2021 was 19/30, suggestive of mild dementia, likely due to Alzheimer's disease, with a component of depression seen today in follow up for memory loss.   She is being seen prior to her scheduled visit on Oct 24 after  a recent ED visit for syncope on 03/22/22, and found to have QTci prolongation on EKG . She was on donepezil which  due to the abnormal findings will recommend to discontinue . Recent MRI brain personally reviewed was remarkable for a 7 mm meningioma without edema  and a faintly enhancing 3 mm lesion left frontal white matter that requires further investigation      Follow up in1 month to discuss the MRI results Continue to control mood as per PCP Recommend good control of cardiovascular risk factors.    Discontinue donepezil due recent abnormal finding consistent with to QTc prolongation  Decrease mirtazapine to 7.5 mg qhs (from 15 mg qhs)to decrease somnolence and risk of falls. May consider melatonin supplement  MRI brain with contrast to further evaluate the enhancing 3 mm lesion left frontal white matter, rule out mets. (Series 12 img 18/27 on 9/20)     Subjective:    This patient is accompanied in the office by her daughter who supplements the history.  Previous records as well as any outside records available were reviewed prior to todays visit.    Any changes in memory since last visit? "Memory shows gradual decline changes, but from the syncopal episode to now is the same ".  Long-term memory is also affected. She had a recent ED visit on 03/22/2022 due to syncopal episode, preceded by a fall the day prior when she hit her right lower back and elbow.  She denies hitting her head.  During the syncopal  episode she did not hit her head either.  Work-up included EKG, which showed QT C prolongation.  It is unclear the nature of the syncope, whether it was cardiac in nature, or that she was dehydrated at the time.  In today's visit, she feels better.  She denies any strokelike symptoms.  Incidentally, it tiny enhancing 3 mm lesion in the left frontal white matter was noted, for which she was asked to be seen for further evaluation today. repeats oneself?  Endorsed Disoriented when walking into a room?  Patient denies   Leaving objects in unusual places?  "Endorses, loses stuff many times "  Wandering behavior?  Patient denies   Patient drives?   She drives short distances, uses GPS to drive. Any mood changes since last visit?  Patient denies   Any worsening depression?:  Patient denies   Hallucinations?  Patient denies   Paranoia?  Patient denies   Patient reports that sleeps " not well, nightmares, denies  REM behavior or sleepwalking"   History of sleep apnea?  Patient denies   Any hygiene concerns?  Patient denies   Independent of bathing and dressing?  Endorsed  Does the patient needs help with medications?  Denies Who is in charge of the finances?   is in charge    Any changes in appetite? She may forget to eat and has been trying to drink more water  Patient have trouble swallowing? Patient denies   Does the patient  cook?  Patient denies   Any kitchen accidents such as leaving the stove on? Patient denies   Any headaches?  Patient denies   Double vision? Patient denies   Any focal numbness or tingling?  Patient denies   Chronic back pain Patient denies   Unilateral weakness?  Patient denies   Any tremors?  Patient denies   Any history of anosmia?  Patient denies   Any incontinence of urine?  Patient denies   Any bowel dysfunction?   Patient denies      Patient lives with:   How long did patient have memory difficulties?  "About 7 years, I feel that my memory is worse, I constantly  have to remember conversations and keeping appointments ".  She continues to try to do crossword puzzles and word finding, although these has become more difficult.  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 Patient lives with: Her son who has cerebral palsy.  She also lives with her grandson and his wife. Repeats oneself?  Endorsed Disoriented when walking into a room?  Patient denies   Leaving objects in unusual places?  She reports losing objects constantly, and recently she has been leaving her phone "everywhere, including under the food in my car ". Ambulates  with difficulty?   Patient denies, but "I feel that my body is out of shape ". Recent falls?  Patient denies   Any head injuries?  She reports having fallen from the roof when she was a child hitting her head ".  No loss of consciousness " History of seizures?   Patient denies   Wandering behavior?  Patient denies   Patient drives?  She drives short distances, and uses his GPS to drive.  Any mood changes?  Denies Any history of depression?:  Endorsed.  Her husband died in 2010/09/23, and she reports that has never recovered despite using good antidepressants.  She never has done any psychotherapy. Hallucinations?  Patient denies   Paranoia?  Patient denies   Patient reports that has intermittent insomnia, reports vivid dreams (not frightening), no REM behavior  History of sleep apnea?  Patient denies   Any hygiene concerns?  Patient denies   Independent of bathing and dressing?  Endorsed  Does the patient needs help with medications? Son monitors but she has them in a pillbox because otherwise she forgets to take them. Who is in charge of the finances? Grandson  Any changes in appetite?  Patient denies   Patient have trouble swallowing? Patient denies   Does the patient cook?  Patient denies   Any kitchen accidents such as leaving the stove on? Patient denies   Any  headaches?  Patient denies   Double vision? Patient denies   Any focal numbness or tingling?  Patient denies   Chronic back pain Patient denies   Unilateral weakness?  Patient denies   Any tremors?  Patient denies   Any history of anosmia?  Denies  Any incontinence of urine?  Patient denies   Any bowel dysfunction?   Patient denies History of heavy alcohol intake?  Patient denies   History of heavy tobacco use?  Patient denies   Family history of dementia?  Denies   Initial visit 01/05/2018, Dr. Delice Lesch I had the pleasure of seeing Charlene Patterson in follow-up in the neurology clinic on 01/05/2017.  The patient was last seen 9 months ago for worsening memory. MMSE in 04/2016 and 06/2015  were 30/30. She continued to report cognitive changes, including getting lost driving, missing bills and medications. She underwent Neuropsychological evaluation last December 2017 which was largely within normal limits. There was no evidence of a neurocognitive disorder. She endorsed significant anxiety and depression, cognitive complaints most likely due to these.    Since her last visit, she reports that things have leveled off, everything has been going great. She denies getting lost driving, no missed bills or medications. She has been keeping active in her garden, which is helping her mind as well. She reports less stress. She reports migraines are under control. No dizziness, vision changes, focal numbness/tingling/weakness, no falls. Back pain better with PT.    HPI 06/18/2015: This is a 76 yo RH woman with a history of hyperlipidemia, chronic back pain,who presented for worsening memory. She reports that her memory is "not too good," and started noticing changes gradually over the past few years. She reports problems mostly with short-term memory, sometimes she forgets to take her medications and her son would remind her. She misplaces things frequently and has been told that she repeats herself. She left the  stove on a few times. She has to write everything down. She denies getting lost driving, and reports she can find her way back if she makes a wrong turn. She lives with her son with cerebral palsy, and denies any missed bill payments. They are planning to move in with her grandson's family soon.    She has a history of migraines since childhood, worse with weather changes and loud sounds and smells. She has occasional dizziness and gets motion sick quickly. She denies any diplopia, blurred vision, dysarthria, dysphagia. She has right hand and arm numbness and tingling when driving. She has chronic neck and back pain, at times triggering her headaches. She has problems with her rectum, but denies any constipation. No anosmia or tremors. She became tearful reporting that her husband passed away in 09/21/10, and reports mood is "up and down." She denies any history of head injuries or alcohol use. Her younger sister has memory problems and got lost driving at age 70.   Records and images were personally reviewed where available.  I personally reviewed MRI brain without contrast which was normal. TSH and B12 normal.   CT of head and neck  personally reviewed shows CT head: 1. 1 cm speckled calcified mass in the right frontal lobe, it which appears to be dural-based and focal hyperostosis of the calvarium, it likely represents a small meningioma. Differential includes other cortical based tumors.  No evidence of acute intracranial hemorrhage or infarct.  CT cervical spine: 1.  No acute fracture or traumatic subluxation. 2.  Paraspinal soft tissues are unremarkable. 3. Degenerate disc disease prominent at C3-C4 and C4-C5 with uncovertebral joint arthropathy. Mild right neural foraminal stenosis at C3-C4  MRI  brain 03/22/22 personally reviewed shows   1. Enhancing mass right frontal lobe appears extra-axial in compatible with meningioma. No brain edema 2. Faintly enhancing 3 mm lesion left frontal white matter. Mild  associated susceptibility. Appearance is nonspecific. 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 Metastatic disease possible.   PREVIOUS MEDICATIONS: Donepezil (QTcI prolongation/syncope)  CURRENT MEDICATIONS:  Outpatient Encounter Medications as of 03/29/2022  Medication Sig   acetaminophen (TYLENOL) 650 MG CR tablet Take 1,300 mg by mouth in the morning and at bedtime.   atorvastatin (LIPITOR) 40 MG tablet TAKE 1 TABLET EVERY DAY (Patient taking  differently: Take 40 mg by mouth daily.)   citalopram (CELEXA) 10 MG tablet Take 10 mg by mouth daily.   cyanocobalamin 1000 MCG tablet Take 1,000 mcg by mouth daily.   donepezil (ARICEPT) 10 MG tablet Take half tablet (5 mg) daily for 2 weeks, then increase to the full tablet at 10 mg daily. (Patient taking differently: Take 10 mg by mouth at bedtime.)   DULoxetine (CYMBALTA) 30 MG capsule TAKE 1 CAPSULE EVERY DAY (Patient taking differently: 30 mg in the morning.)   EXCEDRIN TENSION HEADACHE 500-65 MG TABS per tablet Take 1 tablet by mouth every 6 (six) hours as needed (for headaches).   famotidine (PEPCID) 20 MG tablet TAKE 1 TABLET(20 MG) BY MOUTH AT BEDTIME   FERROUS SULFATE PO Take 2 tablets by mouth 2 (two) times daily with a meal.   lidocaine (HM LIDOCAINE PATCH) 4 % Place 1 patch onto the skin daily.   mirtazapine (REMERON) 15 MG tablet Take 15 mg by mouth at bedtime.   NON FORMULARY Take 1 tablet by mouth See admin instructions. Head Care Protective Health tablets- Take 1 tablet by mouth once a day as needed for neuro health   oxyCODONE (OXY IR/ROXICODONE) 5 MG immediate release tablet Take 1 tablet (5 mg total) by mouth every 8 (eight) hours as needed for severe pain.   pantoprazole (PROTONIX) 40 MG tablet TAKE 1 TABLET EVERY DAY (Patient taking differently: Take 40 mg by mouth daily before breakfast.)   Potassium 99 MG TABS Take 99 mg by mouth at bedtime.   VASCEPA 1 g  capsule TAKE 2 CAPSULES TWICE DAILY (Patient taking differently: Take 2 g by mouth in the morning and at bedtime.)   VITAMIN D3 SUPER STRENGTH 50 MCG (2000 UT) CAPS Take 2,000 Units by mouth at bedtime.   Zinc 50 MG TABS Take 25 mg by mouth at bedtime.   No facility-administered encounter medications on file as of 03/29/2022.       09/02/2018    1:45 PM 08/27/2017    2:37 PM 04/17/2016    4:00 PM  MMSE - Mini Mental State Exam  Orientation to time '5 5 5  '$ Orientation to Place '5 5 5  '$ Registration '3 3 3  '$ Attention/ Calculation '5 5 5  '$ Recall '3 2 3  '$ Language- name 2 objects '2 2 2  '$ Language- repeat '1 1 1  '$ Language- follow 3 step command '3 3 3  '$ Language- read & follow direction '1 1 1  '$ Write a sentence '1 1 1  '$ Copy design '1 1 1  '$ Total score '30 29 30      '$ 01/23/2022   12:00 PM  Montreal Cognitive Assessment   Visuospatial/ Executive (0/5) 2  Naming (0/3) 3  Attention: Read list of digits (0/2) 2  Attention: Read list of letters (0/1) 1  Attention: Serial 7 subtraction starting at 100 (0/3) 2  Language: Repeat phrase (0/2) 1  Language : Fluency (0/1) 0  Abstraction (0/2) 0  Delayed Recall (0/5) 2  Orientation (0/6) 6  Total 19  Adjusted Score (based on education) 19    Objective:     PHYSICAL EXAMINATION:    VITALS:   Vitals:   03/29/22 1132  BP: 120/62  Pulse: 87  Resp: 20  SpO2: 97%  Weight: 128 lb (58.1 kg)  Height: '5\' 2"'$  (1.575 m)    GEN:  The patient appears stated age and is in NAD. HEENT:  Normocephalic, atraumatic.   Neurological examination:  General: NAD,  well-groomed, appears stated age.  Flat affect Orientation: The patient is alert. Oriented to person, place and not to date. Cranial nerves: There is good facial symmetry.The speech is fluent and clear. No aphasia or dysarthria. Fund of knowledge is appropriate. Recent and remote memory are impaired. Attention and concentration are normal.  Able to name objects and repeat phrases.  Hearing is intact to  conversational tone.    Sensation: Sensation is intact to light touch throughout Motor: Strength is at least antigravity x4. Tremors: none  DTR's 2/4 in UE/LE     Movement examination: Tone: There is normal tone in the UE/LE Abnormal movements:  no tremor.  No myoclonus.  No asterixis.   Coordination:  There is no decremation with RAM's. Normal finger to nose  Gait and Station: The patient has no difficulty arising out of a deep-seated chair without the use of the hands. The patient's stride length is good.  Gait is cautious and narrow.    Thank you for allowing [place] the opportunity to participate in the care of this nice patient. Please do not hesitate to contact us for any questions or concerns.   Total time spent on today's visit was 35 minutes dedicated to this patient today, preparing to see patient, examining the patient, ordering tests and/or medications and counseling the patient, documenting clinical information in the EHR or other health record, independently interpreting results and communicating results to the patient/family, discussing treatment and goals, answering patient's questions and coordinating care.  Cc:  Janith Lima, MD  Sharene Butters 03/29/2022 12:35 PM

## 2022-04-03 ENCOUNTER — Telehealth: Payer: Self-pay

## 2022-04-03 NOTE — Telephone Encounter (Signed)
        Patient  visited [facility] 9/20   Telephone encounter attempt :  1st  A HIPAA compliant voice message was left requesting a return call.  Instructed patient to call back .    [facility]  [phone] [address], [place], [place] [address] Phone: [phone] Email: [email or web address]

## 2022-04-05 NOTE — Telephone Encounter (Signed)
Patient's grand daughter called back and wanted to check on this letter.

## 2022-04-12 ENCOUNTER — Encounter: Payer: Self-pay | Admitting: Internal Medicine

## 2022-04-16 IMAGING — DX DG FOREARM 2V*R*
2 series · 2 of 2 positions shown · non-contrast
Comparison: None.

CLINICAL DATA: Acute right arm pain after fall last week.

EXAM:
RIGHT FOREARM - 2 VIEW

[forearm ap]
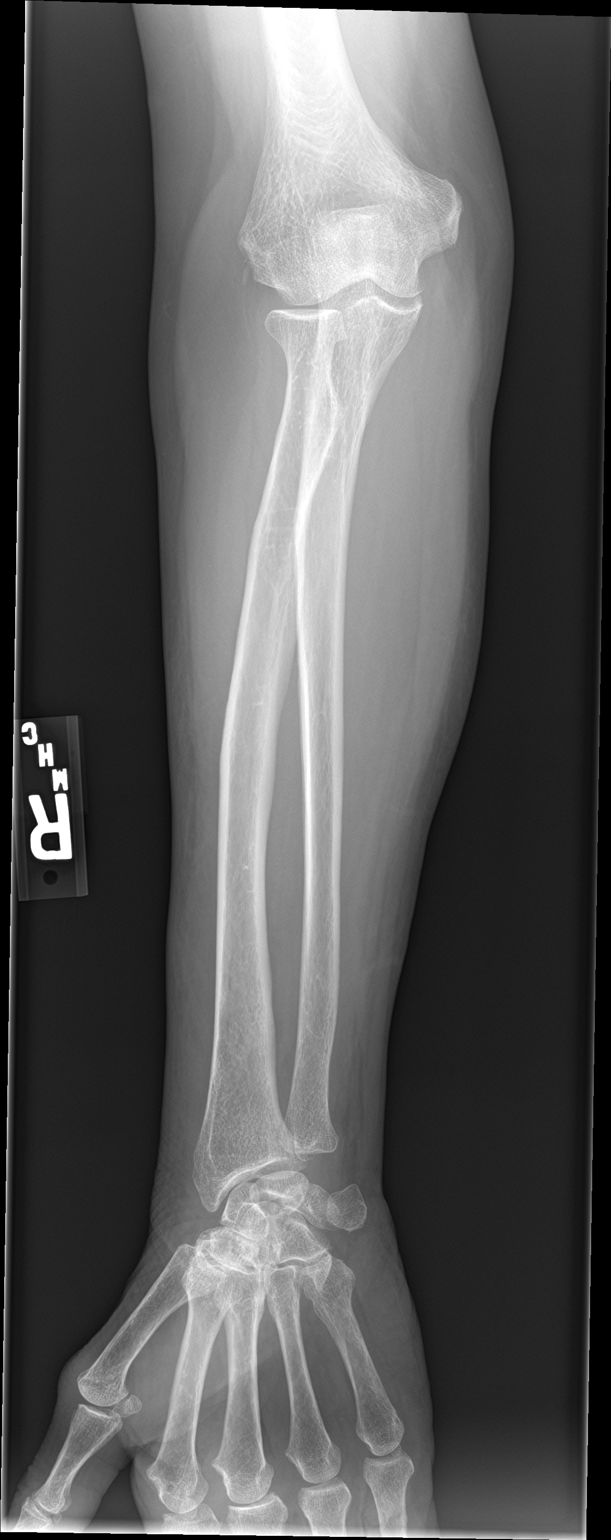

[forearm lat]
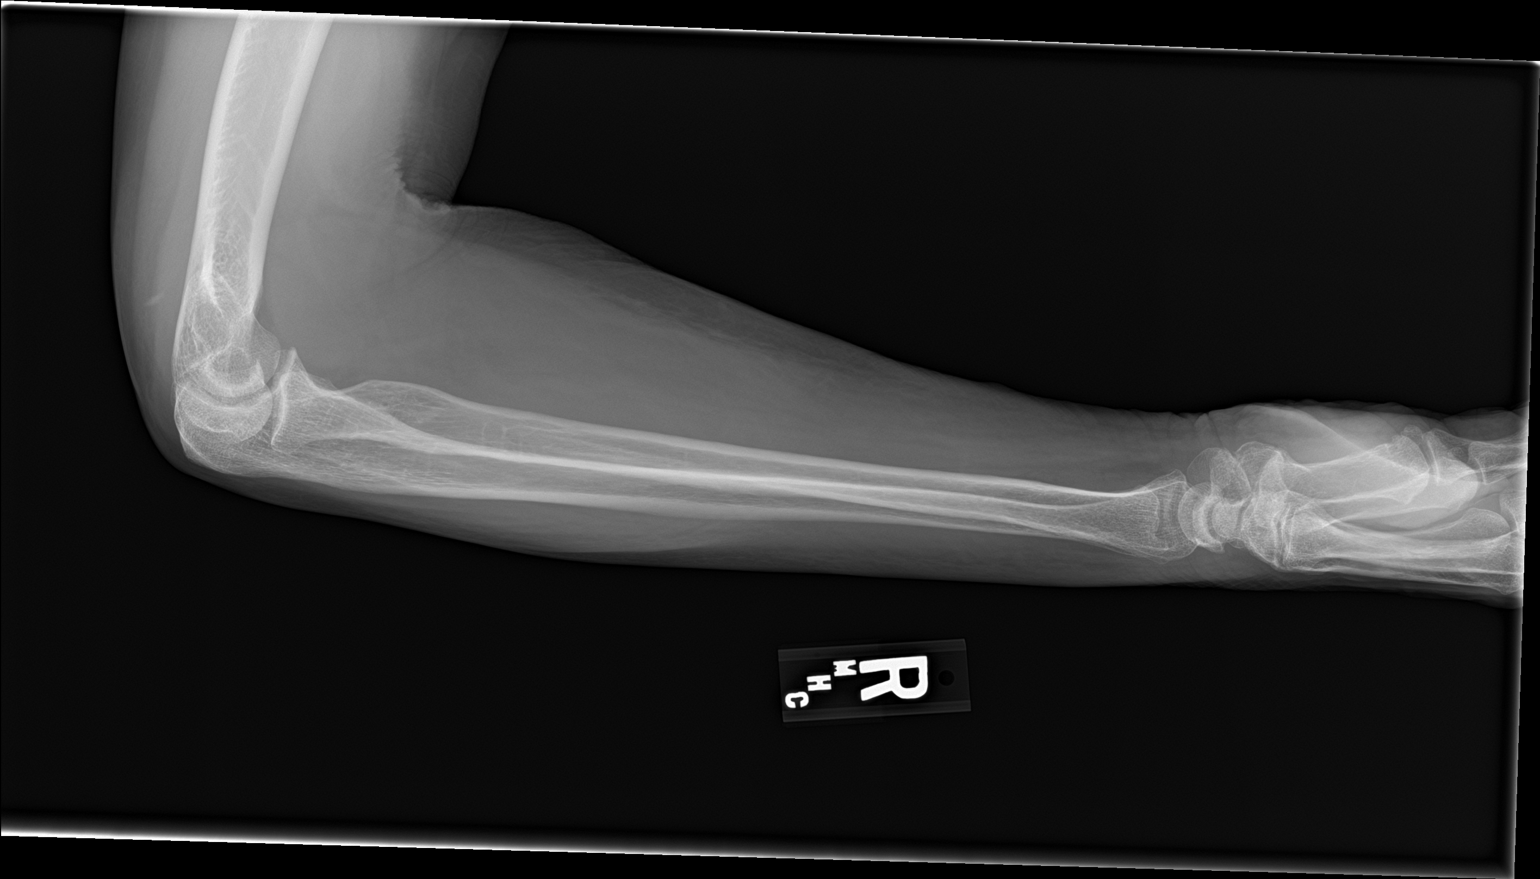

[2 of 2 positions shown; findings below may reference images not displayed]

FINDINGS: There is no evidence of fracture or other focal bone lesions. Soft
tissues are unremarkable.
IMPRESSION: Negative.

## 2022-04-17 ENCOUNTER — Ambulatory Visit
Admission: RE | Admit: 2022-04-17 | Discharge: 2022-04-17 | Disposition: A | Discharge status: Home / Self Care | Payer: Medicare HMO | Source: Ambulatory Visit | Attending: Physician Assistant | Admitting: Physician Assistant

## 2022-04-17 DIAGNOSIS — C711 Malignant neoplasm of frontal lobe: Secondary | ICD-10-CM | POA: Diagnosis not present

## 2022-04-17 MED ORDER — GADOPICLENOL 0.5 MMOL/ML IV SOLN
6.0000 mL | Freq: Once | INTRAVENOUS | Status: AC | PRN
  Administered 2022-04-17: 6 mL via INTRAVENOUS

## 2022-04-19 DIAGNOSIS — F4323 Adjustment disorder with mixed anxiety and depressed mood: Secondary | ICD-10-CM | POA: Diagnosis not present

## 2022-04-21 NOTE — Progress Notes (Signed)
MRI brain shows again the known little growth outside of the brain, with similar measurements, otherwise no acute findings, thanks

## 2022-04-23 ENCOUNTER — Encounter: Payer: Self-pay | Admitting: Cardiovascular Disease

## 2022-04-23 NOTE — Progress Notes (Unsigned)
Cardiology Office Note:    Date:  04/24/2022   ID:  Charlene Patterson, DOB [birth year], MRN [id number]  PCP:  Janith Lima, MD   [facility] Cardiologist:   Briya Lookabaugh     Referring MD: Sherrell Puller, PA-C   Chief Complaint  Patient presents with   Palpitations   Hyperlipidemia    History of Present Illness: Oct. 23, 2023    Charlene Patterson is a 76 y.o. female with a hx of HLD,  And a recent episode of syncope   Seen with Granddaughter, Joseph Art  Has a hx of dementia  Was seen in the ER  Was found to have long QT on ECG  3 weeks ago .  Pt had slipped on the stairs the day prior to the syncopal episode  Was up doing household chores, felt nauseated  ( had not eaten that day ) .   Stood up from her chair to go to the couch, but passed out on the way to the couch  Has a sore back from the falls   Was found to have a prolonged QT .  Her neurologist took her off 1 memory medication that might be contributing to long QT  ( Donepezil )   Is part  [place],  is very tan        Past Medical History:  Diagnosis Date   Allergy    Anxiety    Arthritis    "left foot" (05/26/2014)   Chronic back pain    Chronic neck pain    Depression    GERD (gastroesophageal reflux disease)    History of chicken pox    History of colon polyps    History of hiatal hernia    HLD (hyperlipidemia)    IBS (irritable bowel syndrome)    Lower GI bleeding    Migraines    "monthly usually; weekly lately" (05/26/2014)   Sciatica    Thoracic compression fracture (HCC)     Past Surgical History:  Procedure Laterality Date   ABDOMINAL HYSTERECTOMY  ~ 1978   "partial"   APPENDECTOMY  ~ [place]  07/04/2003   last 2005   POLYPECTOMY     UPPER GASTROINTESTINAL ENDOSCOPY      Current Medications: Current Meds  Medication Sig   acetaminophen (TYLENOL) 650 MG CR tablet Take 1,300 mg by mouth in the morning and at bedtime.    atorvastatin (LIPITOR) 40 MG tablet TAKE 1 TABLET EVERY DAY (Patient taking differently: Take 40 mg by mouth daily.)   citalopram (CELEXA) 10 MG tablet Take 10 mg by mouth daily.   cyanocobalamin 1000 MCG tablet Take 1,000 mcg by mouth daily.   DULoxetine (CYMBALTA) 30 MG capsule TAKE 1 CAPSULE EVERY DAY (Patient taking differently: 30 mg in the morning.)   EXCEDRIN TENSION HEADACHE 500-65 MG TABS per tablet Take 1 tablet by mouth every 6 (six) hours as needed (for headaches).   famotidine (PEPCID) 20 MG tablet TAKE 1 TABLET(20 MG) BY MOUTH AT BEDTIME   FERROUS SULFATE PO Take 2 tablets by mouth 2 (two) times daily with a meal.   lidocaine (HM LIDOCAINE PATCH) 4 % Place 1 patch onto the skin daily.   mirtazapine (REMERON) 15 MG tablet Take 15 mg by mouth at bedtime. Taking 7.'5mg'$    NON FORMULARY Take 1 tablet by mouth See admin instructions. Head Care Protective Health tablets- Take 1 tablet by mouth once a day  as needed for neuro health   oxyCODONE (OXY IR/ROXICODONE) 5 MG immediate release tablet Take 1 tablet (5 mg total) by mouth every 8 (eight) hours as needed for severe pain.   pantoprazole (PROTONIX) 40 MG tablet TAKE 1 TABLET EVERY DAY (Patient taking differently: Take 40 mg by mouth daily before breakfast.)   Potassium 99 MG TABS Take 99 mg by mouth at bedtime.   VASCEPA 1 g capsule TAKE 2 CAPSULES TWICE DAILY (Patient taking differently: Take 2 g by mouth in the morning and at bedtime.)   VITAMIN D3 SUPER STRENGTH 50 MCG (2000 UT) CAPS Take 2,000 Units by mouth at bedtime.   Zinc 50 MG TABS Take 25 mg by mouth at bedtime.     Allergies:   Patient has no known allergies.   Social History   Socioeconomic History   Marital status: Widowed    Spouse name: Not on file   Number of children: 2   Years of education: 30   Highest education level: Not on file  Occupational History   Occupation: Retired  Tobacco Use   Smoking status: Never   Smokeless tobacco: Never  Vaping Use    Vaping Use: Never used  Substance and Sexual Activity   Alcohol use: No   Drug use: No   Sexual activity: Never  Other Topics Concern   Not on file  Social History Narrative   Regular exercise-no   Caffeine Use-yes   Social Determinants of Health   Financial Resource Strain: Low Risk  (10/10/2021)   Overall Financial Resource Strain (CARDIA)    Difficulty of Paying Living Expenses: Not hard at all  Food Insecurity: No Food Insecurity (10/10/2021)   Hunger Vital Sign    Worried About Running Out of Food in the Last Year: Never true    Ran Out of Food in the Last Year: Never true  Transportation Needs: No Transportation Needs (10/10/2021)   PRAPARE - Hydrologist (Medical): No    Lack of Transportation (Non-Medical): No  Physical Activity: Insufficiently Active (10/10/2021)   Exercise Vital Sign    Days of Exercise per Week: 3 days    Minutes of Exercise per Session: 30 min  Stress: No Stress Concern Present (10/10/2021)   [place]    Feeling of Stress : Not at all  Social Connections: Moderately Integrated (10/10/2021)   Social Connection and Isolation Panel [NHANES]    Frequency of Communication with Friends and Family: Twice a week    Frequency of Social Gatherings with Friends and Family: Twice a week    Attends Religious Services: More than 4 times per year    Active Member of [employer] or Organizations: Yes    Attends Archivist Meetings: 1 to 4 times per year    Marital Status: Widowed     Family History: The patient's family history includes Cancer in her brother; Cerebral palsy in her son; Colon polyps in her sister and another family member; Dementia in her sister; Diabetes in her brother, sister, and sister; Heart attack in her brother, brother, and father; Heart disease in her brother, father, maternal grandmother, maternal uncle, and mother; Irritable bowel syndrome in  her sister; Migraines in her paternal grandfather; Prostate cancer in her father; Stomach cancer (age of onset: 82) in her sister. There is no history of Rectal cancer or Colon cancer.  ROS:   Please see the history of present illness.  All other systems reviewed and are negative.  EKGs/Labs/Other Studies Reviewed:    The following studies were reviewed today:  EKG: ECG from March 22, 2022 reveals normal sinus rhythm 60.  QT interval is 596 ms.  ECG from April 24, 2022 reveals normal sinus rhythm at 76.  QTc is 461 ms.  Recent Labs: 03/22/2022: BUN 14; Creatinine, Ser 0.97; Hemoglobin 12.9; Magnesium 2.4; Platelets 296; Potassium 4.0; Sodium 139  Recent Lipid Panel    Component Value Date/Time   CHOL 162 01/10/2022 1504   TRIG 174.0 (H) 01/10/2022 1504   HDL 42.90 01/10/2022 1504   CHOLHDL 4 01/10/2022 1504   VLDL 34.8 01/10/2022 1504   LDLCALC 85 01/10/2022 1504   LDLDIRECT 56.0 05/20/2020 1628     Risk Assessment/Calculations:                Physical Exam:    VS:  BP 116/64   Pulse 82   Ht '5\' 2"'$  (1.575 m)   Wt 129 lb 3.2 oz (58.6 kg)   SpO2 97%   BMI 23.63 kg/m     Wt Readings from Last 3 Encounters:  04/24/22 129 lb 3.2 oz (58.6 kg)  03/29/22 128 lb (58.1 kg)  03/22/22 125 lb (56.7 kg)     GEN:  Well nourished, thin,  in no acute distress HEENT: Normal NECK: No JVD; No carotid bruits LYMPHATICS: No lymphadenopathy CARDIAC: RR , soft systolic murmur  RESPIRATORY:  Clear to auscultation without rales, wheezing or rhonchi  ABDOMEN: Soft, non-tender, non-distended MUSCULOSKELETAL:  No edema; No deformity  SKIN: Warm and dry,  face, arms, hands, feet are tan  NEUROLOGIC:  Alert and oriented x 3 PSYCHIATRIC:  Normal affect   ASSESSMENT:    1. Syncope, unspecified syncope type   2. Prolonged QT interval   3. Syncope and collapse    PLAN:      Syncope: The patient presents with an episode of syncope.  It is difficult to say whether or not  this was due to an episode of orthostatic hypotension.  She was noted to have a long QT interval.  Her neurologist stopped 1 for memory medications.  Today her QTc is normal.  We will schedule her for an echocardiogram, carotid Dopplers, 14-day event monitor.  We will see her again in 3 to 4 months.             Medication Adjustments/Labs and Tests Ordered: Current medicines are reviewed at length with the patient today.  Concerns regarding medicines are outlined above.  Orders Placed This Encounter  Procedures   LONG TERM MONITOR (3-14 DAYS)   EKG 12-Lead   ECHOCARDIOGRAM COMPLETE   VAS US CAROTID   No orders of the defined types were placed in this encounter.   Patient Instructions  Medication Instructions:  Your physician recommends that you continue on your current medications as directed. Please refer to the Current Medication list given to you today.  *If you need a refill on your cardiac medications before your next appointment, please call your pharmacy*   Lab Work: NONE If you have labs (blood work) drawn today and your tests are completely normal, you will receive your results only by: [facility] (if you have MyChart) OR A paper copy in the mail If you have any lab test that is abnormal or we need to change your treatment, we will call you to review the results.   Testing/Procedures: Carotid Ultrasound Your physician has requested that you have a  carotid duplex. This test is an ultrasound of the carotid arteries in your neck. It looks at blood flow through these arteries that supply the brain with blood. Allow one hour for this exam. There are no restrictions or special instructions.  14 day Zio monitor Your physician has recommended that you wear an event monitor. Event monitors are medical devices that record the heart's electrical activity. Doctors most often [place] these monitors to diagnose arrhythmias. Arrhythmias are problems with the speed or rhythm of  the heartbeat. The monitor is a small, portable device. You can wear one while you do your normal daily activities. This is usually used to diagnose what is causing palpitations/syncope (passing out).  ECHO Your physician has requested that you have an echocardiogram. Echocardiography is a painless test that uses sound waves to create images of your heart. It provides your doctor with information about the size and shape of your heart and how well your heart's chambers and valves are working. This procedure takes approximately one hour. There are no restrictions for this procedure. Please do NOT wear cologne, perfume, aftershave, or lotions (deodorant is allowed). Please arrive 15 minutes prior to your appointment time.  Follow-Up: At [facility], you and your health needs are our priority.  As part of our continuing mission to provide you with exceptional heart care, we have created designated Provider Care Teams.  These Care Teams include your primary Cardiologist (physician) and Advanced Practice Providers (APPs -  Physician Assistants and Nurse Practitioners) who all work together to provide you with the care you need, when you need it.  Your next appointment:   3-4 month(s)  The format for your next appointment:   In Person  Provider:   Mertie Moores, MD   Other Instructions ZIO XT- Long Term Monitor Instructions  Your physician has requested you wear a ZIO patch monitor for 14 days.  This is a single patch monitor. [employer] supplies one patch monitor per enrollment. Additional stickers are not available. Please do not apply patch if you will be having a Nuclear Stress Test,  Echocardiogram, Cardiac CT, MRI, or Chest Xray during the period you would be wearing the  monitor. The patch cannot be worn during these tests. You cannot remove and re-apply the  ZIO XT patch monitor.  Your ZIO patch monitor will be mailed 3 day USPS to your address on file. It may take 3-5 days  to  receive your monitor after you have been enrolled.  Once you have received your monitor, please review the enclosed instructions. Your monitor  has already been registered assigning a specific monitor serial # to you.  Billing and Patient Assistance Program Information  We have supplied [employer] with any of your insurance information on file for billing purposes. [employer] offers a sliding scale Patient Assistance Program for patients that do not have  insurance, or whose insurance does not completely cover the cost of the ZIO monitor.  You must apply for the Patient Assistance Program to qualify for this discounted rate.  To apply, please call [employer] at [phone], select option 4, select option 2, ask to apply for  Patient Assistance Program. Theodore Demark will ask your household income, and how many people  are in your household. They will quote your out-of-pocket cost based on that information.  [employer] will also be able to set up a 67-month interest-free payment plan if needed.  Applying the monitor   Shave hair from upper left chest.  Hold abrader disc by  orange tab. Rub abrader in 40 strokes over the upper left chest as  indicated in your monitor instructions.  Clean area with 4 enclosed alcohol pads. Let dry.  Apply patch as indicated in monitor instructions. Patch will be placed under collarbone on left  side of chest with arrow pointing upward.  Rub patch adhesive wings for 2 minutes. Remove white label marked "1". Remove the white  label marked "2". Rub patch adhesive wings for 2 additional minutes.  While looking in a mirror, press and release button in center of patch. A small green light will  flash 3-4 times. This will be your only indicator that the monitor has been turned on.  Do not shower for the first 24 hours. You may shower after the first 24 hours.  Press the button if you feel a symptom. You will hear a small click. Record Date, Time and  Symptom in the Patient  Logbook.  When you are ready to remove the patch, follow instructions on the last 2 pages of Patient  Logbook. Stick patch monitor onto the last page of Patient Logbook.  Place Patient Logbook in the blue and white box. Use locking tab on box and tape box closed  securely. The blue and white box has prepaid postage on it. Please place it in the mailbox as  soon as possible. Your physician should have your test results approximately 7 days after the  monitor has been mailed back to [facility].  Call [place] at [phone] if you have questions regarding  your ZIO XT patch monitor. Call them immediately if you see an orange light blinking on your  monitor.  If your monitor falls off in less than 4 days, contact our Monitor department at [phone].  If your monitor becomes loose or falls off after 4 days call [employer] at [phone] for  suggestions on securing your monitor   Important Information About Sugar         Signed, Mertie Moores, MD  04/24/2022 4:45 PM    [place]

## 2022-04-24 ENCOUNTER — Ambulatory Visit: Payer: Medicare HMO | Attending: Cardiovascular Disease

## 2022-04-24 ENCOUNTER — Ambulatory Visit: Payer: Medicare HMO | Attending: Cardiovascular Disease | Admitting: Cardiovascular Disease

## 2022-04-24 ENCOUNTER — Encounter: Payer: Self-pay | Admitting: Cardiovascular Disease

## 2022-04-24 VITALS — BP 116/64 | HR 82 | Ht 62.0 in | Wt 129.2 lb

## 2022-04-24 DIAGNOSIS — R9431 Abnormal electrocardiogram [ECG] [EKG]: Secondary | ICD-10-CM | POA: Diagnosis not present

## 2022-04-24 DIAGNOSIS — R55 Syncope and collapse: Secondary | ICD-10-CM | POA: Diagnosis not present

## 2022-04-24 HISTORY — DX: Abnormal electrocardiogram (ECG) (EKG): R94.31

## 2022-04-24 HISTORY — DX: Syncope and collapse: R55

## 2022-04-24 NOTE — Patient Instructions (Addendum)
Medication Instructions:  Your physician recommends that you continue on your current medications as directed. Please refer to the Current Medication list given to you today.  *If you need a refill on your cardiac medications before your next appointment, please call your pharmacy*   Lab Work: NONE If you have labs (blood work) drawn today and your tests are completely normal, you will receive your results only by: [place] (if you have MyChart) OR A paper copy in the mail If you have any lab test that is abnormal or we need to change your treatment, we will call you to review the results.   Testing/Procedures: Carotid Ultrasound Your physician has requested that you have a carotid duplex. This test is an ultrasound of the carotid arteries in your neck. It looks at blood flow through these arteries that supply the brain with blood. Allow one hour for this exam. There are no restrictions or special instructions.  14 day Zio monitor Your physician has recommended that you wear an event monitor. Event monitors are medical devices that record the heart's electrical activity. Doctors most often [place] these monitors to diagnose arrhythmias. Arrhythmias are problems with the speed or rhythm of the heartbeat. The monitor is a small, portable device. You can wear one while you do your normal daily activities. This is usually used to diagnose what is causing palpitations/syncope (passing out).  ECHO Your physician has requested that you have an echocardiogram. Echocardiography is a painless test that uses sound waves to create images of your heart. It provides your doctor with information about the size and shape of your heart and how well your heart's chambers and valves are working. This procedure takes approximately one hour. There are no restrictions for this procedure. Please do NOT wear cologne, perfume, aftershave, or lotions (deodorant is allowed). Please arrive 15 minutes prior to your  appointment time.  Follow-Up: At [facility], you and your health needs are our priority.  As part of our continuing mission to provide you with exceptional heart care, we have created designated Provider Care Teams.  These Care Teams include your primary Cardiologist (physician) and Advanced Practice Providers (APPs -  Physician Assistants and Nurse Practitioners) who all work together to provide you with the care you need, when you need it.  Your next appointment:   3-4 month(s)  The format for your next appointment:   In Person  Provider:   Mertie Moores, MD   Other Instructions ZIO XT- Long Term Monitor Instructions  Your physician has requested you wear a ZIO patch monitor for 14 days.  This is a single patch monitor. [employer] supplies one patch monitor per enrollment. Additional stickers are not available. Please do not apply patch if you will be having a Nuclear Stress Test,  Echocardiogram, Cardiac CT, MRI, or Chest Xray during the period you would be wearing the  monitor. The patch cannot be worn during these tests. You cannot remove and re-apply the  ZIO XT patch monitor.  Your ZIO patch monitor will be mailed 3 day USPS to your address on file. It may take 3-5 days  to receive your monitor after you have been enrolled.  Once you have received your monitor, please review the enclosed instructions. Your monitor  has already been registered assigning a specific monitor serial # to you.  Billing and Patient Assistance Program Information  We have supplied [employer] with any of your insurance information on file for billing purposes. [employer] offers a sliding scale  Patient Assistance Program for patients that do not have  insurance, or whose insurance does not completely cover the cost of the ZIO monitor.  You must apply for the Patient Assistance Program to qualify for this discounted rate.  To apply, please call [employer] at [phone], select option 4, select option  2, ask to apply for  Patient Assistance Program. Theodore Demark will ask your household income, and how many people  are in your household. They will quote your out-of-pocket cost based on that information.  [employer] will also be able to set up a 36-month interest-free payment plan if needed.  Applying the monitor   Shave hair from upper left chest.  Hold abrader disc by orange tab. Rub abrader in 40 strokes over the upper left chest as  indicated in your monitor instructions.  Clean area with 4 enclosed alcohol pads. Let dry.  Apply patch as indicated in monitor instructions. Patch will be placed under collarbone on left  side of chest with arrow pointing upward.  Rub patch adhesive wings for 2 minutes. Remove white label marked "1". Remove the white  label marked "2". Rub patch adhesive wings for 2 additional minutes.  While looking in a mirror, press and release button in center of patch. A small green light will  flash 3-4 times. This will be your only indicator that the monitor has been turned on.  Do not shower for the first 24 hours. You may shower after the first 24 hours.  Press the button if you feel a symptom. You will hear a small click. Record Date, Time and  Symptom in the Patient Logbook.  When you are ready to remove the patch, follow instructions on the last 2 pages of Patient  Logbook. Stick patch monitor onto the last page of Patient Logbook.  Place Patient Logbook in the blue and white box. Use locking tab on box and tape box closed  securely. The blue and white box has prepaid postage on it. Please place it in the mailbox as  soon as possible. Your physician should have your test results approximately 7 days after the  [facility]  Call [employer] [phone] you have questions regarding  your ZIO XT patch monitor. Call them immediately if you see an orange light blinking on your  monitor.  If your monitor falls  off in less than 4 days, contact our Monitor department at [phone]  If your monitor becomes loose or falls off after 4 days call [employer] at [phone]  suggestions on securing your monitor   Important Information About Sugar

## 2022-04-24 NOTE — Progress Notes (Unsigned)
Enrolled for [employer] to mail a ZIO XT long term holter monitor to the patients address on file.  

## 2022-04-25 ENCOUNTER — Ambulatory Visit: Payer: Medicare HMO | Admitting: Physician Assistant

## 2022-04-25 ENCOUNTER — Encounter: Payer: Self-pay | Admitting: Physician Assistant

## 2022-04-25 VITALS — Resp 20 | Ht 62.0 in

## 2022-04-25 DIAGNOSIS — G309 Alzheimer's disease, unspecified: Secondary | ICD-10-CM | POA: Diagnosis not present

## 2022-04-25 DIAGNOSIS — D329 Benign neoplasm of meninges, unspecified: Secondary | ICD-10-CM | POA: Diagnosis not present

## 2022-04-25 DIAGNOSIS — F0280 Dementia in other diseases classified elsewhere without behavioral disturbance: Secondary | ICD-10-CM | POA: Diagnosis not present

## 2022-04-25 MED ORDER — MEMANTINE HCL 5 MG PO TABS: 5.0000 mg | ORAL_TABLET | Freq: Every evening | ORAL | 11 refills | Status: DC

## 2022-04-25 NOTE — Progress Notes (Signed)
Assessment/Plan:    Dementia likely due to Alzheimer's Disease Charlene Patterson is a very pleasant 76 y.o. RH femalewith  a history of hypertension, hyperlipidemia, anxiety, depression, CKD stage III,  QTc prolongation, osteoarthritis and a recent diagnosis of Dementia likely due to Alzheimer's Disease seen today in follow up to discuss the 04/17/22 MRI of the brain with contrast to further evaluate possible meningioma.  These were personally reviewed, remarkable for  re-demonstrated densely enhancing mass in the right frontal lobe, which appears to be dural-based, most likely a meningioma without acute findings.    Follow up in   months. Continue to control mood as per PCP Recommend good control of cardiovascular risk factors.   Continue mirtazapine 7.5 mg qhs     Subjective:    This patient is accompanied in the office by  who supplements the history.  Previous records as well as any outside records available were reviewed prior to todays visit. Last seen on 03/29/22. Last MoCA on 12/2021 was 19/30.   Any changes in memory since last visit? Tolerating meds?   04/17/22 MRI brain was remarkable for Re-demonstrated densely enhancing mass in the right frontal lobe, which appears to be dural-based, most likely a meningioma. No significant surrounding T2 hyperintense signal in the adjacent right frontal lobe. 2. The previously described focus of enhancement in the left frontal white matter is not appreciated on this exam.3. No acute intracranial process.  Recent visit 12/2021 How long did patient have memory difficulties?  "About 7 years, I feel that my memory is worse, I constantly have to remember conversations and keeping appointments ".  She continues to try to do crossword puzzles and word finding, although these has become more difficult.  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 Patient lives with: Her son who has  cerebral palsy.  She also lives with her grandson and his wife. Repeats oneself?  Endorsed Disoriented when walking into a room?  Patient denies   Leaving objects in unusual places?  She reports losing objects constantly, and recently she has been leaving her phone "everywhere, including under the food in my car ". Ambulates  with difficulty?   Patient denies, but "I feel that my body is out of shape ". Recent falls?  Patient denies   Any head injuries?  She reports having fallen from the roof when she was a child hitting her head ".  No loss of consciousness " History of seizures?   Patient denies   Wandering behavior?  Patient denies   Patient drives?  She drives short distances, and uses his GPS to drive.  Any mood changes?  Denies Any history of depression?:  Endorsed.  Her husband died in 2010/10/10, and she reports that has never recovered despite using good antidepressants.  She never has done any psychotherapy. Hallucinations?  Patient denies   Paranoia?  Patient denies   Patient reports that has intermittent insomnia, reports vivid dreams (not frightening), no REM behavior  History of sleep apnea?  Patient denies   Any hygiene concerns?  Patient denies   Independent of bathing and dressing?  Endorsed  Does the patient needs help with medications? Son monitors but she has them in a pillbox because otherwise she forgets to take them. Who is in charge of the finances? Grandson  Any changes in appetite?  Patient denies   Patient have trouble swallowing? Patient denies   Does the patient cook?  Patient denies   Any kitchen accidents such as leaving the stove on? Patient denies   Any headaches?  Patient denies   Double vision? Patient denies   Any focal numbness or tingling?  Patient denies   Chronic back pain Patient denies   Unilateral weakness?  Patient denies   Any tremors?  Patient denies   Any history of anosmia?  Denies  Any incontinence of urine?  Patient denies   Any bowel  dysfunction?   Patient denies History of heavy alcohol intake?  Patient denies   History of heavy tobacco use?  Patient denies   Family history of dementia?  Denies    Initial visit 01/05/2018, Dr. Delice Lesch I had the pleasure of seeing Charlene Patterson in follow-up in the neurology clinic on 01/05/2017.  The patient was last seen 9 months ago for worsening memory. MMSE in 04/2016 and 06/2015 were 30/30. She continued to report cognitive changes, including getting lost driving, missing bills and medications. She underwent Neuropsychological evaluation last December 2017 which was largely within normal limits. There was no evidence of a neurocognitive disorder. She endorsed significant anxiety and depression, cognitive complaints most likely due to these.    Since her last visit, she reports that things have leveled off, everything has been going great. She denies getting lost driving, no missed bills or medications. She has been keeping active in her garden, which is helping her mind as well. She reports less stress. She reports migraines are under control. No dizziness, vision changes, focal numbness/tingling/weakness, no falls. Back pain better with PT.    HPI 06/18/2015: This is a 76 yo RH woman with a history of hyperlipidemia, chronic back pain,who presented for worsening memory. She reports that her memory is "not too good," and started noticing changes gradually over the past few years. She reports problems mostly with short-term memory, sometimes she forgets to take her medications and her son would remind her. She misplaces things frequently and has been told that she repeats herself. She left the stove on a few times. She has to write everything down. She denies getting lost driving, and reports she can find her way back if she makes a wrong turn. She lives with her son with cerebral palsy, and denies any missed bill payments. They are planning to move in with her grandson's family soon.    She has a  history of migraines since childhood, worse with weather changes and loud sounds and smells. She has occasional dizziness and gets motion sick quickly. She denies any diplopia, blurred vision, dysarthria, dysphagia. She has right hand and arm numbness and tingling when driving. She has chronic neck and back pain, at times triggering her headaches. She has problems with her rectum, but denies any constipation. No anosmia or tremors. She became tearful reporting that her husband passed away in Oct 10, 2010, and reports mood is "up and down." She denies any history of head injuries or alcohol use. Her younger sister has memory problems and got lost driving at age 41.   Records and images were personally reviewed where available.  I personally reviewed MRI brain without contrast which was normal. TSH and B12 normal.     CT of head and neck  personally reviewed shows CT head: 1. 1 cm speckled calcified mass in the right frontal lobe, it which appears to be dural-based and focal hyperostosis of the calvarium, it likely represents a small meningioma. Differential includes other cortical based tumors.  No evidence of acute intracranial hemorrhage  or infarct.  CT cervical spine: 1.  No acute fracture or traumatic subluxation. 2.  Paraspinal soft tissues are unremarkable. 3. Degenerate disc disease prominent at C3-C4 and C4-C5 with uncovertebral joint arthropathy. Mild right neural foraminal stenosis at C3-C4   MRI  brain 03/22/22 personally reviewed shows   1. Enhancing mass right frontal lobe appears extra-axial in compatible with meningioma. No brain edema 2. Faintly enhancing 3 mm lesion left frontal white matter. Mild associated susceptibility. Appearance is nonspecific. 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 Metastatic disease possible.    PREVIOUS MEDICATIONS: Donepezil (QTcI prolongation/syncope).  CURRENT MEDICATIONS:  Outpatient Encounter  Medications as of 04/25/2022  Medication Sig   acetaminophen (TYLENOL) 650 MG CR tablet Take 1,300 mg by mouth in the morning and at bedtime.   atorvastatin (LIPITOR) 40 MG tablet TAKE 1 TABLET EVERY DAY (Patient taking differently: Take 40 mg by mouth daily.)   citalopram (CELEXA) 10 MG tablet Take 10 mg by mouth daily.   cyanocobalamin 1000 MCG tablet Take 1,000 mcg by mouth daily.   DULoxetine (CYMBALTA) 30 MG capsule TAKE 1 CAPSULE EVERY DAY (Patient taking differently: 30 mg in the morning.)   EXCEDRIN TENSION HEADACHE 500-65 MG TABS per tablet Take 1 tablet by mouth every 6 (six) hours as needed (for headaches).   famotidine (PEPCID) 20 MG tablet TAKE 1 TABLET(20 MG) BY MOUTH AT BEDTIME   FERROUS SULFATE PO Take 2 tablets by mouth 2 (two) times daily with a meal.   lidocaine (HM LIDOCAINE PATCH) 4 % Place 1 patch onto the skin daily.   mirtazapine (REMERON) 15 MG tablet Take 15 mg by mouth at bedtime. Taking 7.'5mg'$    NON FORMULARY Take 1 tablet by mouth See admin instructions. Head Care Protective Health tablets- Take 1 tablet by mouth once a day as needed for neuro health   oxyCODONE (OXY IR/ROXICODONE) 5 MG immediate release tablet Take 1 tablet (5 mg total) by mouth every 8 (eight) hours as needed for severe pain.   pantoprazole (PROTONIX) 40 MG tablet TAKE 1 TABLET EVERY DAY (Patient taking differently: Take 40 mg by mouth daily before breakfast.)   Potassium 99 MG TABS Take 99 mg by mouth at bedtime.   VASCEPA 1 g capsule TAKE 2 CAPSULES TWICE DAILY (Patient taking differently: Take 2 g by mouth in the morning and at bedtime.)   VITAMIN D3 SUPER STRENGTH 50 MCG (2000 UT) CAPS Take 2,000 Units by mouth at bedtime.   Zinc 50 MG TABS Take 25 mg by mouth at bedtime.   No facility-administered encounter medications on file as of 04/25/2022.       09/02/2018    1:45 PM 08/27/2017    2:37 PM 04/17/2016    4:00 PM  MMSE - Mini Mental State Exam  Orientation to time '5 5 5  '$ Orientation to  Place '5 5 5  '$ Registration '3 3 3  '$ Attention/ Calculation '5 5 5  '$ Recall '3 2 3  '$ Language- name 2 objects '2 2 2  '$ Language- repeat '1 1 1  '$ Language- follow 3 step command '3 3 3  '$ Language- read & follow direction '1 1 1  '$ Write a sentence '1 1 1  '$ Copy design '1 1 1  '$ Total score '30 29 30      '$ 01/23/2022   12:00 PM  Montreal Cognitive Assessment   Visuospatial/ Executive (0/5) 2  Naming (0/3) 3  Attention: Read list of digits (0/2) 2  Attention: Read list of letters (0/1) 1  Attention: Serial 7 subtraction starting at 100 (0/3) 2  Language: Repeat phrase (0/2) 1  Language : Fluency (0/1) 0  Abstraction (0/2) 0  Delayed Recall (0/5) 2  Orientation (0/6) 6  Total 19  Adjusted Score (based on education) 19   Thank you for allowing [place] the opportunity to participate in the care of this nice patient. Please do not hesitate to contact us for any questions or concerns.   Total time spent on today's visit was *** minutes dedicated to this patient today, preparing to see patient, examining the patient, ordering tests and/or medications and counseling the patient, documenting clinical information in the EHR or other health record, independently interpreting results and communicating results to the patient/family, discussing treatment and goals, answering patient's questions and coordinating care.  Cc:  Janith Lima, MD  Sharene Butters 04/25/2022 7:36 AM

## 2022-04-25 NOTE — Patient Instructions (Signed)
It was a pleasure to see you today at our office.   Recommendations:  Neurocognitive evaluation at our office in April 2024  Start Memantine 5 mg tablets.  Take 1 tablet at bedtime  Continue mirtazapine to 7.5 mg at night  Follow up in  3 months   Whom to call:  Memory  decline, memory medications: Call our office [phone]   For psychiatric meds, mood meds: Please have your primary care physician manage these medications.   Counseling regarding caregiver distress, including caregiver depression, anxiety and issues regarding community resources, adult day care programs, adult living facilities, or memory care questions:   Feel free to contact [place], Social Worker at [phone]   For assessment of decision of mental capacity and competency:  Call Dr. Anthoney Harada, geriatric psychiatrist at [phone]  For guidance in geriatric dementia issues please call [facility] [phone]  Consider [place]  [place], [place] [phone]  Hours of Operation Mondays to Thursdays: 8 am to 8 pm,Fridays: 9 am to 8 pm, Saturdays: 9 am to 1 pm Sundays: Closed  [email or web address]/parks-[email or web address]/active-[email or web address]   If you have any severe symptoms of a stroke, or other severe issues such as confusion,severe chills or fever, etc call 911 or go to the ER as you may need to be evaluated further       RECOMMENDATIONS FOR ALL PATIENTS WITH MEMORY PROBLEMS: 1. Continue to exercise (Recommend 30 minutes of walking everyday, or 3 hours every week) 2. Increase social interactions - continue going to [place] and enjoy social gatherings with friends and family 3. Eat healthy, avoid fried foods and eat more fruits and vegetables 4. Maintain adequate blood pressure, blood sugar, and blood cholesterol level. Reducing the risk of stroke and cardiovascular disease also helps promoting  better memory. 5. Avoid stressful situations. Live a simple life and avoid aggravations. Organize your time and prepare for the next day in anticipation. 6. Sleep well, avoid any interruptions of sleep and avoid any distractions in the bedroom that may interfere with adequate sleep quality 7. Avoid sugar, avoid sweets as there is a strong link between excessive sugar intake, diabetes, and cognitive impairment We discussed the Mediterranean diet, which has been shown to help patients reduce the risk of progressive memory disorders and reduces cardiovascular risk. This includes eating fish, eat fruits and green leafy vegetables, nuts like almonds and hazelnuts, walnuts, and also use olive oil. Avoid fast foods and fried foods as much as possible. Avoid sweets and sugar as sugar use has been linked to worsening of memory function.  There is always a concern of gradual progression of memory problems. If this is the case, then we may need to adjust level of care according to patient needs. Support, both to the patient and caregiver, should then be put into place.      You have been referred for a neuropsychological evaluation (i.e., evaluation of memory and thinking abilities). Please bring someone with you to this appointment if possible, as it is helpful for the doctor to hear from both you and another adult who knows you well. Please bring eyeglasses and hearing aids if you wear them.    The evaluation will take approximately 3 hours and has two parts:   The first part is a clinical interview with the neuropsychologist (Dr. Melvyn Novas or Dr. Nicole Kindred). During the interview, the neuropsychologist will speak with you and the individual you brought to the appointment.    The  second part of the evaluation is testing with the doctor's technician Hinton Dyer or Maudie Mercury). During the testing, the technician will ask you to remember different types of material, solve problems, and answer some questionnaires. Your family member  will not be present for this portion of the evaluation.   Please note: We must reserve several hours of the neuropsychologist's time and the psychometrician's time for your evaluation appointment. As such, there is a No-Show fee of $100. If you are unable to attend any of your appointments, please contact our office as soon as possible to reschedule.    FALL PRECAUTIONS: Be cautious when walking. Scan the area for obstacles that may increase the risk of trips and falls. When getting up in the mornings, sit up at the edge of the bed for a few minutes before getting out of bed. Consider elevating the bed at the head end to avoid drop of blood pressure when getting up. Walk always in a well-lit room (use night lights in the walls). Avoid area rugs or power cords from appliances in the middle of the walkways. Use a walker or a cane if necessary and consider physical therapy for balance exercise. Get your eyesight checked regularly.  FINANCIAL OVERSIGHT: Supervision, especially oversight when making financial decisions or transactions is also recommended.  HOME SAFETY: Consider the safety of the kitchen when operating appliances like stoves, microwave oven, and blender. Consider having supervision and share cooking responsibilities until no longer able to participate in those. Accidents with firearms and other hazards in the house should be identified and addressed as well.   ABILITY TO BE LEFT ALONE: If patient is unable to contact 911 operator, consider using LifeLine, or when the need is there, arrange for someone to stay with patients. Smoking is a fire hazard, consider supervision or cessation. Risk of wandering should be assessed by caregiver and if detected at any point, supervision and safe proof recommendations should be instituted.  MEDICATION SUPERVISION: Inability to self-administer medication needs to be constantly addressed. Implement a mechanism to ensure safe administration of the  medications.   DRIVING: Regarding driving, in patients with progressive memory problems, driving will be impaired. We advise to have someone else do the driving if trouble finding directions or if minor accidents are reported. Independent driving assessment is available to determine safety of driving.   If you are interested in the driving assessment, you can contact the following:  [employer] in [place]  [employer] [phone] or [phone]    [place] refers to food and lifestyle choices that are based on the traditions of countries located on the [employer]. This way of eating has been shown to help prevent certain conditions and improve outcomes for people who have chronic diseases, like kidney disease and heart disease. What are tips for following this plan? Lifestyle  Cook and eat meals together with your family, when possible. Drink enough fluid to keep your urine clear or pale yellow. Be physically active every day. This includes: Aerobic exercise like running or swimming. Leisure activities like gardening, walking, or housework. Get 7-8 hours of sleep each night. If recommended by your health care provider, drink red wine in moderation. This means 1 glass a day for nonpregnant women and 2 glasses a day for men. A glass of wine equals 5 oz (150 mL). Reading food labels  Check the serving size of packaged foods. For foods  such as rice and pasta, the serving size refers to the amount of cooked product, not dry. Check the total fat in packaged foods. Avoid foods that have saturated fat or trans fats. Check the ingredients list for added sugars, such as corn syrup. Shopping  At the grocery store, buy most of your food from the areas near the walls of the store. This includes: Fresh fruits and vegetables (produce). Grains,  beans, nuts, and seeds. Some of these may be available in unpackaged forms or large amounts (in bulk). Fresh seafood. Poultry and eggs. Low-fat dairy products. Buy whole ingredients instead of prepackaged foods. Buy fresh fruits and vegetables in-season from local farmers markets. Buy frozen fruits and vegetables in resealable bags. If you do not have access to quality fresh seafood, buy precooked frozen shrimp or canned fish, such as tuna, salmon, or sardines. Buy small amounts of raw or cooked vegetables, salads, or olives from the deli or salad bar at your store. Stock your pantry so you always have certain foods on hand, such as olive oil, canned tuna, canned tomatoes, rice, pasta, and beans. Cooking  Cook foods with extra-virgin olive oil instead of using butter or other vegetable oils. Have meat as a side dish, and have vegetables or grains as your main dish. This means having meat in small portions or adding small amounts of meat to foods like pasta or stew. Use beans or vegetables instead of meat in common dishes like chili or lasagna. Experiment with different cooking methods. Try roasting or broiling vegetables instead of steaming or sauteing them. Add frozen vegetables to soups, stews, pasta, or rice. Add nuts or seeds for added healthy fat at each meal. You can add these to yogurt, salads, or vegetable dishes. Marinate fish or vegetables using olive oil, lemon juice, garlic, and fresh herbs. Meal planning  Plan to eat 1 vegetarian meal one day each week. Try to work up to 2 vegetarian meals, if possible. Eat seafood 2 or more times a week. Have healthy snacks readily available, such as: Vegetable sticks with hummus. Greek yogurt. Fruit and nut trail mix. Eat balanced meals throughout the week. This includes: Fruit: 2-3 servings a day Vegetables: 4-5 servings a day Low-fat dairy: 2 servings a day Fish, poultry, or lean meat: 1 serving a day Beans and legumes: 2 or more  servings a week Nuts and seeds: 1-2 servings a day Whole grains: 6-8 servings a day Extra-virgin olive oil: 3-4 servings a day Limit red meat and sweets to only a few servings a month What are my food choices? Mediterranean diet Recommended Grains: Whole-grain pasta. Brown rice. Bulgar wheat. Polenta. Couscous. Whole-wheat bread. [place]. Vegetables: Artichokes. Beets. Broccoli. Cabbage. Carrots. Eggplant. Green beans. Chard. Kale. Spinach. Onions. Leeks. Peas. Squash. Tomatoes. Peppers. Radishes. Fruits: Apples. Apricots. Avocado. Berries. Bananas. Cherries. Dates. Figs. Grapes. Lemons. Melon. Oranges. Peaches. Plums. Pomegranate. Meats and other protein foods: Beans. Almonds. Sunflower seeds. Pine nuts. Peanuts. [place]. Salmon. Scallops. Shrimp. [place]. Tilapia. Clams. Oysters. Eggs. Dairy: Low-fat milk. Cheese. Greek yogurt. Beverages: Water. Red wine. Herbal tea. Fats and oils: Extra virgin olive oil. Avocado oil. Grape seed oil. Sweets and desserts: [place] yogurt with honey. Baked apples. Poached pears. Trail mix. Seasoning and other foods: Basil. Cilantro. Coriander. Cumin. Mint. Parsley. Sage. Rosemary. Tarragon. Garlic. Oregano. Thyme. Pepper. Balsalmic vinegar. Tahini. Hummus. Tomato sauce. Olives. Mushrooms. Limit these Grains: Prepackaged pasta or rice dishes. Prepackaged cereal with added sugar. Vegetables: Deep fried potatoes (french fries). Fruits: Fruit canned in syrup.  Meats and other protein foods: Beef. Pork. Lamb. Poultry with skin. Hot dogs. [place] Salines. Dairy: Ice cream. Sour cream. Whole milk. Beverages: Juice. Sugar-sweetened soft drinks. Beer. Liquor and spirits. Fats and oils: Butter. Canola oil. Vegetable oil. Beef fat (tallow). Lard. Sweets and desserts: Cookies. Cakes. Pies. Candy. Seasoning and other foods: Mayonnaise. Premade sauces and marinades. The items listed may not be a complete list. Talk with your dietitian about what dietary choices are right for  you. Summary The Mediterranean diet includes both food and lifestyle choices. Eat a variety of fresh fruits and vegetables, beans, nuts, seeds, and whole grains. Limit the amount of red meat and sweets that you eat. Talk with your health care provider about whether it is safe for you to drink red wine in moderation. This means 1 glass a day for nonpregnant women and 2 glasses a day for men. A glass of wine equals 5 oz (150 mL). This information is not intended to replace advice given to you by your health care provider. Make sure you discuss any questions you have with your health care provider. Document Released: 02/10/2016 Document Revised: 03/14/2016 Document Reviewed: 02/10/2016 Elsevier Interactive Patient Education  2017 [employer].    We have sent a referral to [facility] for your MRI and they will call you directly to schedule your appointment. They are located at [place]. If you need to contact them directly please call [phone].

## 2022-04-27 DIAGNOSIS — D329 Benign neoplasm of meninges, unspecified: Secondary | ICD-10-CM

## 2022-04-27 HISTORY — DX: Benign neoplasm of meninges, unspecified: D32.9

## 2022-05-02 NOTE — Telephone Encounter (Signed)
Patient is unsure at the moment because of the cost. She is going to talk to her children and see what they think and call us back

## 2022-05-02 NOTE — Telephone Encounter (Signed)
Called patient again 05/02/22 to see if she would like to schedule prolia shot, Left voicemail

## 2022-05-09 ENCOUNTER — Ambulatory Visit: Payer: Medicare HMO | Admitting: Physician Assistant

## 2022-05-12 ENCOUNTER — Ambulatory Visit (HOSPITAL_COMMUNITY): Payer: Medicare HMO | Attending: Cardiovascular Disease

## 2022-05-12 DIAGNOSIS — R55 Syncope and collapse: Secondary | ICD-10-CM | POA: Insufficient documentation

## 2022-05-12 LAB — ECHOCARDIOGRAM COMPLETE
Area-P 1/2: 2.87 cm2
S' Lateral: 2.95 cm

## 2022-05-13 DIAGNOSIS — R55 Syncope and collapse: Secondary | ICD-10-CM | POA: Diagnosis not present

## 2022-05-14 ENCOUNTER — Encounter: Payer: Self-pay | Admitting: Physician Assistant

## 2022-05-14 DIAGNOSIS — I779 Disorder of arteries and arterioles, unspecified: Secondary | ICD-10-CM

## 2022-05-15 ENCOUNTER — Ambulatory Visit (HOSPITAL_COMMUNITY)
Admission: RE | Admit: 2022-05-15 | Discharge: 2022-05-15 | Disposition: A | Discharge status: Home / Self Care | Payer: Medicare HMO | Source: Ambulatory Visit | Attending: Cardiovascular Disease | Admitting: Cardiovascular Disease

## 2022-05-15 ENCOUNTER — Other Ambulatory Visit: Payer: Self-pay | Admitting: Physician Assistant

## 2022-05-15 DIAGNOSIS — R55 Syncope and collapse: Secondary | ICD-10-CM | POA: Insufficient documentation

## 2022-05-15 MED ORDER — MEMANTINE HCL 5 MG PO TABS: 5.0000 mg | ORAL_TABLET | Freq: Two times a day (BID) | ORAL | 11 refills | Status: DC

## 2022-05-15 NOTE — Telephone Encounter (Signed)
Ok to increase to 5 mg twice a day. Will end the prescription to the Pharmacy, thanks

## 2022-05-16 NOTE — Telephone Encounter (Signed)
Left detailed message per DPR of results. Repeat carotid US placed at this time to be done in 1 year.

## 2022-05-16 NOTE — Telephone Encounter (Signed)
-----   Message from Thayer Headings, MD sent at 05/16/2022  1:02 PM EST ----- Moderate L carotid artery disease  Right carotid is normal   Repeat carotid duplex in 1 year

## 2022-06-01 ENCOUNTER — Telehealth: Payer: Self-pay | Admitting: Internal Medicine

## 2022-06-01 ENCOUNTER — Other Ambulatory Visit: Payer: Self-pay | Admitting: Internal Medicine

## 2022-06-01 DIAGNOSIS — M159 Polyosteoarthritis, unspecified: Secondary | ICD-10-CM

## 2022-06-01 DIAGNOSIS — F339 Major depressive disorder, recurrent, unspecified: Secondary | ICD-10-CM

## 2022-06-01 DIAGNOSIS — F450 Somatization disorder: Secondary | ICD-10-CM

## 2022-06-01 MED ORDER — MIRTAZAPINE 15 MG PO TABS: 15.0000 mg | ORAL_TABLET | Freq: Every day | ORAL | 0 refills | Status: DC

## 2022-06-01 MED ORDER — MIRTAZAPINE 7.5 MG PO TABS: 7.5000 mg | ORAL_TABLET | Freq: Every day | ORAL | 0 refills | Status: DC

## 2022-06-01 MED ORDER — DULOXETINE HCL 30 MG PO CPEP: 30.0000 mg | ORAL_CAPSULE | Freq: Every day | ORAL | 0 refills | Status: DC

## 2022-06-01 NOTE — Telephone Encounter (Signed)
[facility] called to confirm dosage form on Remeron medication as instructions say 15 mg as well as 7.5 mg. After speaking with PCP I have let the pharmacy staff know to cancel 15 mg order.   Please send in 7.5 dosage of remeron

## 2022-06-01 NOTE — Telephone Encounter (Signed)
Patient needs the follow medications refilled:  remron 7.5 and duloxetine 30 mg.  - Patient has switched from mail order.  She wants all prescriptions sent to [place], [place]

## 2022-06-02 ENCOUNTER — Telehealth: Payer: Self-pay | Admitting: Cardiovascular Disease

## 2022-06-02 DIAGNOSIS — R55 Syncope and collapse: Secondary | ICD-10-CM | POA: Diagnosis not present

## 2022-06-02 MED ORDER — METOPROLOL SUCCINATE ER 25 MG PO TB24: ORAL_TABLET | ORAL | 12 refills | Status: DC

## 2022-06-02 NOTE — Telephone Encounter (Signed)
-----   Message from Thayer Headings, MD sent at 06/02/2022  4:54 PM EST ----- Lets add toprol XL 25 mg a day to see if this helps these episodes of tachycardia

## 2022-06-02 NOTE — Telephone Encounter (Signed)
Medication sent to pharmacy on file. Patient aware.

## 2022-06-14 DIAGNOSIS — N1831 Chronic kidney disease, stage 3a: Secondary | ICD-10-CM | POA: Diagnosis not present

## 2022-06-14 DIAGNOSIS — R4189 Other symptoms and signs involving cognitive functions and awareness: Secondary | ICD-10-CM | POA: Diagnosis not present

## 2022-06-14 DIAGNOSIS — E785 Hyperlipidemia, unspecified: Secondary | ICD-10-CM | POA: Diagnosis not present

## 2022-06-14 DIAGNOSIS — K219 Gastro-esophageal reflux disease without esophagitis: Secondary | ICD-10-CM | POA: Diagnosis not present

## 2022-06-15 ENCOUNTER — Other Ambulatory Visit (HOSPITAL_COMMUNITY): Payer: Self-pay

## 2022-06-15 ENCOUNTER — Other Ambulatory Visit: Payer: Self-pay | Admitting: Nephrology

## 2022-06-15 DIAGNOSIS — N1831 Chronic kidney disease, stage 3a: Secondary | ICD-10-CM

## 2022-06-15 DIAGNOSIS — K219 Gastro-esophageal reflux disease without esophagitis: Secondary | ICD-10-CM

## 2022-06-15 DIAGNOSIS — R4189 Other symptoms and signs involving cognitive functions and awareness: Secondary | ICD-10-CM

## 2022-06-15 DIAGNOSIS — E785 Hyperlipidemia, unspecified: Secondary | ICD-10-CM

## 2022-06-15 NOTE — Telephone Encounter (Signed)
Pharmacy benefit is cheaper for the patient. Send rx to [facility] 

## 2022-06-15 NOTE — Telephone Encounter (Signed)
Forwarding to RX Prior Auth Team 

## 2022-06-15 NOTE — Telephone Encounter (Signed)
Pharmacy Patient Advocate Encounter  Insurance verification completed.    The patient is insured through [place] test claims for: Prolia '60mg'$ .  Pharmacy benefit copay: $95.00

## 2022-06-20 ENCOUNTER — Ambulatory Visit
Admission: RE | Admit: 2022-06-20 | Discharge: 2022-06-20 | Disposition: A | Discharge status: Home / Self Care | Payer: Medicare HMO | Source: Ambulatory Visit | Attending: Nephrology | Admitting: Nephrology

## 2022-06-20 DIAGNOSIS — E785 Hyperlipidemia, unspecified: Secondary | ICD-10-CM

## 2022-06-20 DIAGNOSIS — N1831 Chronic kidney disease, stage 3a: Secondary | ICD-10-CM

## 2022-06-20 DIAGNOSIS — K219 Gastro-esophageal reflux disease without esophagitis: Secondary | ICD-10-CM

## 2022-06-20 DIAGNOSIS — R4189 Other symptoms and signs involving cognitive functions and awareness: Secondary | ICD-10-CM

## 2022-06-20 DIAGNOSIS — N183 Chronic kidney disease, stage 3 unspecified: Secondary | ICD-10-CM | POA: Diagnosis not present

## 2022-06-23 ENCOUNTER — Telehealth: Payer: Self-pay | Admitting: Internal Medicine

## 2022-06-23 NOTE — Telephone Encounter (Signed)
Err

## 2022-07-06 ENCOUNTER — Other Ambulatory Visit (HOSPITAL_COMMUNITY): Payer: Self-pay

## 2022-07-06 NOTE — Telephone Encounter (Signed)
Pharmacy benefit is cheaper for the patient. Send rx to [facility] 

## 2022-07-13 ENCOUNTER — Encounter: Payer: Self-pay | Admitting: Internal Medicine

## 2022-07-13 ENCOUNTER — Ambulatory Visit (INDEPENDENT_AMBULATORY_CARE_PROVIDER_SITE_OTHER): Payer: Medicare HMO | Admitting: Internal Medicine

## 2022-07-13 VITALS — BP 124/74 | HR 66 | Temp 97.7°F | Resp 16 | Ht 62.0 in | Wt 134.0 lb

## 2022-07-13 DIAGNOSIS — E785 Hyperlipidemia, unspecified: Secondary | ICD-10-CM

## 2022-07-13 DIAGNOSIS — K5904 Chronic idiopathic constipation: Secondary | ICD-10-CM

## 2022-07-13 DIAGNOSIS — K219 Gastro-esophageal reflux disease without esophagitis: Secondary | ICD-10-CM | POA: Diagnosis not present

## 2022-07-13 DIAGNOSIS — N1831 Chronic kidney disease, stage 3a: Secondary | ICD-10-CM

## 2022-07-13 HISTORY — DX: Chronic idiopathic constipation: K59.04

## 2022-07-13 LAB — TSH: TSH: 2.96 u[IU]/mL (ref 0.35–5.50)

## 2022-07-13 MED ORDER — ATORVASTATIN CALCIUM 40 MG PO TABS: 40.0000 mg | ORAL_TABLET | Freq: Every day | ORAL | 1 refills | Status: DC

## 2022-07-13 MED ORDER — LUBIPROSTONE 24 MCG PO CAPS: 24.0000 ug | ORAL_CAPSULE | Freq: Two times a day (BID) | ORAL | 1 refills | Status: DC

## 2022-07-13 MED ORDER — PANTOPRAZOLE SODIUM 40 MG PO TBEC: 40.0000 mg | DELAYED_RELEASE_TABLET | Freq: Every day | ORAL | 0 refills | Status: DC

## 2022-07-13 NOTE — Progress Notes (Unsigned)
Subjective:  Patient ID: Charlene Patterson, female    DOB: 09-Nov-1945  Age: 77 y.o. MRN: [id number]  CC: No chief complaint on file.   HPI Charlene Patterson presents for ***  Outpatient Medications Prior to Visit  Medication Sig Dispense Refill   acetaminophen (TYLENOL) 650 MG CR tablet Take 1,300 mg by mouth in the morning and at bedtime.     cyanocobalamin 1000 MCG tablet Take 1,000 mcg by mouth daily.     DULoxetine (CYMBALTA) 30 MG capsule Take 1 capsule (30 mg total) by mouth daily. 90 capsule 0   EXCEDRIN TENSION HEADACHE 500-65 MG TABS per tablet Take 1 tablet by mouth every 6 (six) hours as needed (for headaches).     famotidine (PEPCID) 20 MG tablet TAKE 1 TABLET(20 MG) BY MOUTH AT BEDTIME 90 tablet 3   memantine (NAMENDA) 5 MG tablet Take 1 tablet (5 mg total) by mouth 2 (two) times daily. 60 tablet 11   metoprolol succinate (TOPROL XL) 25 MG 24 hr tablet Take 1 tablet by mouth to suppress increased heart rates 30 tablet 12   mirtazapine (REMERON) 7.5 MG tablet Take 1 tablet (7.5 mg total) by mouth at bedtime. 90 tablet 0   NON FORMULARY Take 1 tablet by mouth See admin instructions. Head Care Protective Health tablets- Take 1 tablet by mouth once a day as needed for neuro health     oxyCODONE (OXY IR/ROXICODONE) 5 MG immediate release tablet Take 1 tablet (5 mg total) by mouth every 8 (eight) hours as needed for severe pain. 90 tablet 0   Potassium 99 MG TABS Take 99 mg by mouth at bedtime.     VASCEPA 1 g capsule TAKE 2 CAPSULES TWICE DAILY (Patient taking differently: Take 2 g by mouth in the morning and at bedtime.) 360 capsule 1   VITAMIN D3 SUPER STRENGTH 50 MCG (2000 UT) CAPS Take 2,000 Units by mouth at bedtime.     Zinc 50 MG TABS Take 25 mg by mouth at bedtime.     atorvastatin (LIPITOR) 40 MG tablet TAKE 1 TABLET EVERY DAY (Patient taking differently: Take 40 mg by mouth daily.) 90 tablet 1   pantoprazole (PROTONIX) 40 MG tablet TAKE 1 TABLET EVERY DAY (Patient taking  differently: Take 40 mg by mouth daily before breakfast.) 90 tablet 0   FERROUS SULFATE PO Take 2 tablets by mouth 2 (two) times daily with a meal.     lidocaine (HM LIDOCAINE PATCH) 4 % Place 1 patch onto the skin daily. 15 patch 0   No facility-administered medications prior to visit.    ROS Review of Systems  Objective:  BP 124/74 (BP Location: Left Arm, Patient Position: Sitting, Cuff Size: Large)   Pulse 66   Temp 97.7 F (36.5 C) (Oral)   Ht '5\' 2"'$  (1.575 m)   Wt 134 lb (60.8 kg)   SpO2 94%   BMI 24.51 kg/m   BP Readings from Last 3 Encounters:  07/13/22 124/74  04/24/22 116/64  03/29/22 120/62    Wt Readings from Last 3 Encounters:  07/13/22 134 lb (60.8 kg)  04/24/22 129 lb 3.2 oz (58.6 kg)  03/29/22 128 lb (58.1 kg)    Physical Exam  Lab Results  Component Value Date   WBC 6.5 03/22/2022   HGB 12.9 03/22/2022   HCT 39.2 03/22/2022   PLT 296 03/22/2022   GLUCOSE 83 07/13/2022   CHOL 162 01/10/2022   TRIG 174.0 (H) 01/10/2022   HDL  42.90 01/10/2022   LDLDIRECT 56.0 05/20/2020   LDLCALC 85 01/10/2022   ALT 24 07/13/2022   AST 20 07/13/2022   NA 140 07/13/2022   K 4.2 07/13/2022   CL 104 07/13/2022   CREATININE 1.10 07/13/2022   BUN 25 (H) 07/13/2022   CO2 25 07/13/2022   TSH 2.96 07/13/2022   HGBA1C 5.6 07/30/2017    US RENAL  Result Date: 06/21/2022 CLINICAL DATA:  Stage III chronic renal disease EXAM: RENAL / URINARY TRACT ULTRASOUND COMPLETE COMPARISON:  None Available. FINDINGS: Right Kidney: Renal measurements: 9.4 x 3.6 x 4.3 cm = volume: 76.3 mL. Echogenicity within normal limits. No mass or hydronephrosis visualized. Left Kidney: Renal measurements: 9.3 x 4.6 x 4.9 cm = volume: 109.7 mL. Echogenicity within normal limits. No mass or hydronephrosis visualized. Bladder: Partially decompressed. Other: None. IMPRESSION: No hydronephrosis. Electronically Signed   By: Lovey Newcomer M.D.   On: 06/21/2022 06:48    Assessment & Plan:   Diagnoses  and all orders for this visit:  Chronic idiopathic constipation -     TSH; Future -     Basic metabolic panel; Future -     Magnesium; Future -     lubiprostone (AMITIZA) 24 MCG capsule; Take 1 capsule (24 mcg total) by mouth 2 (two) times daily with a meal. -     Magnesium -     Basic metabolic panel -     TSH  Gastroesophageal reflux disease without esophagitis -     pantoprazole (PROTONIX) 40 MG tablet; Take 1 tablet (40 mg total) by mouth daily.  Hyperlipidemia with target LDL less than 130 -     atorvastatin (LIPITOR) 40 MG tablet; Take 1 tablet (40 mg total) by mouth daily. -     Hepatic function panel; Future -     Hepatic function panel  Stage 3a chronic kidney disease ([place])   I have discontinued Charlene Patterson's FERROUS SULFATE PO and lidocaine. I have also changed her pantoprazole and atorvastatin. Additionally, I am having her start on lubiprostone. Lastly, I am having her maintain her acetaminophen, cyanocobalamin, Potassium, Zinc, Vascepa, famotidine, oxyCODONE, Vitamin D3 Super Strength, Excedrin Tension Headache, NON FORMULARY, memantine, DULoxetine, mirtazapine, and metoprolol succinate.  Meds ordered this encounter  Medications   pantoprazole (PROTONIX) 40 MG tablet    Sig: Take 1 tablet (40 mg total) by mouth daily.    Dispense:  90 tablet    Refill:  0   atorvastatin (LIPITOR) 40 MG tablet    Sig: Take 1 tablet (40 mg total) by mouth daily.    Dispense:  90 tablet    Refill:  1   lubiprostone (AMITIZA) 24 MCG capsule    Sig: Take 1 capsule (24 mcg total) by mouth 2 (two) times daily with a meal.    Dispense:  180 capsule    Refill:  1     Follow-up: Return in about 6 months (around 01/11/2023).  Scarlette Calico, MD

## 2022-07-13 NOTE — Patient Instructions (Signed)

## 2022-07-14 LAB — BASIC METABOLIC PANEL
BUN: 25 mg/dL — ABNORMAL HIGH (ref 6–23)
CO2: 25 mEq/L (ref 19–32)
Calcium: 9.5 mg/dL (ref 8.4–10.5)
Chloride: 104 mEq/L (ref 96–112)
Creatinine, Ser: 1.1 mg/dL (ref 0.40–1.20)
GFR: 49.05 mL/min — ABNORMAL LOW (ref >60.00)
Glucose, Bld: 83 mg/dL (ref 70–99)
Potassium: 4.2 mEq/L (ref 3.5–5.1)
Sodium: 140 mEq/L (ref 135–145)

## 2022-07-14 LAB — HEPATIC FUNCTION PANEL
ALT: 24 U/L (ref 0–35)
AST: 20 U/L (ref 0–37)
Albumin: 4.4 g/dL (ref 3.5–5.2)
Alkaline Phosphatase: 163 U/L — ABNORMAL HIGH (ref 39–117)
Bilirubin, Direct: 0.1 mg/dL (ref 0.0–0.3)
Total Bilirubin: 0.3 mg/dL (ref 0.2–1.2)
Total Protein: 7.2 g/dL (ref 6.0–8.3)

## 2022-07-14 LAB — MAGNESIUM: Magnesium: 2.1 mg/dL (ref 1.5–2.5)

## 2022-07-26 ENCOUNTER — Encounter: Payer: Self-pay | Admitting: Physician Assistant

## 2022-07-26 ENCOUNTER — Ambulatory Visit: Payer: Medicare HMO | Admitting: Physician Assistant

## 2022-07-26 VITALS — BP 103/64 | HR 69 | Resp 18 | Ht 62.5 in | Wt 136.0 lb

## 2022-07-26 DIAGNOSIS — F0280 Dementia in other diseases classified elsewhere without behavioral disturbance: Secondary | ICD-10-CM

## 2022-07-26 DIAGNOSIS — G309 Alzheimer's disease, unspecified: Secondary | ICD-10-CM

## 2022-07-26 NOTE — Patient Instructions (Signed)
It was a pleasure to see you today at our office.   Recommendations:  Neurocognitive evaluation at our office in April 2024  Start Memantine 5 mg tablets twice daily  Continue mirtazapine to 7.5 mg at night  Follow up in 6 months   Whom to call:  Memory  decline, memory medications: Call our office [phone]   For psychiatric meds, mood meds: Please have your primary care physician manage these medications.   Counseling regarding caregiver distress, including caregiver depression, anxiety and issues regarding community resources, adult day care programs, adult living facilities, or memory care questions:   Feel free to contact Dyer, Social Worker at [phone]   For assessment of decision of mental capacity and competency:  Call Dr. Anthoney Harada, geriatric psychiatrist at [phone]  For guidance in geriatric dementia issues please call [facility] [phone]  Consider [place]  [place], [place] [phone]  Hours of Operation Mondays to Thursdays: 8 am to 8 pm,Fridays: 9 am to 8 pm, Saturdays: 9 am to 1 pm Sundays: Closed  [email or web address]   If you have any severe symptoms of a stroke, or other severe issues such as confusion,severe chills or fever, etc call 911 or go to the ER as you may need to be evaluated further       RECOMMENDATIONS FOR ALL PATIENTS WITH MEMORY PROBLEMS: 1. Continue to exercise (Recommend 30 minutes of walking everyday, or 3 hours every week) 2. Increase social interactions - continue going to [place] and enjoy social gatherings with friends and family 3. Eat healthy, avoid fried foods and eat more fruits and vegetables 4. Maintain adequate blood pressure, blood sugar, and blood cholesterol level. Reducing the risk of stroke and cardiovascular disease also helps promoting better memory. 5.  Avoid stressful situations. Live a simple life and avoid aggravations. Organize your time and prepare for the next day in anticipation. 6. Sleep well, avoid any interruptions of sleep and avoid any distractions in the bedroom that may interfere with adequate sleep quality 7. Avoid sugar, avoid sweets as there is a strong link between excessive sugar intake, diabetes, and cognitive impairment We discussed the Mediterranean diet, which has been shown to help patients reduce the risk of progressive memory disorders and reduces cardiovascular risk. This includes eating fish, eat fruits and green leafy vegetables, nuts like almonds and hazelnuts, walnuts, and also use olive oil. Avoid fast foods and fried foods as much as possible. Avoid sweets and sugar as sugar use has been linked to worsening of memory function.  There is always a concern of gradual progression of memory problems. If this is the case, then we may need to adjust level of care according to patient needs. Support, both to the patient and caregiver, should then be put into place.      You have been referred for a neuropsychological evaluation (i.e., evaluation of memory and thinking abilities). Please bring someone with you to this appointment if possible, as it is helpful for the doctor to hear from both you and another adult who knows you well. Please bring eyeglasses and hearing aids if you wear them.    The evaluation will take approximately 3 hours and has two parts:   The first part is a clinical interview with the neuropsychologist (Dr. Melvyn Novas or Dr. Nicole Kindred). During the interview, the neuropsychologist will speak with you and the individual you brought to the appointment.    The second part of the evaluation  is testing with the doctor's technician Hinton Dyer or Maudie Mercury). During the testing, the technician will ask you to remember different types of material, solve problems, and answer some questionnaires. Your family member will not be  present for this portion of the evaluation.   Please note: We must reserve several hours of the neuropsychologist's time and the psychometrician's time for your evaluation appointment. As such, there is a No-Show fee of $100. If you are unable to attend any of your appointments, please contact our office as soon as possible to reschedule.    FALL PRECAUTIONS: Be cautious when walking. Scan the area for obstacles that may increase the risk of trips and falls. When getting up in the mornings, sit up at the edge of the bed for a few minutes before getting out of bed. Consider elevating the bed at the head end to avoid drop of blood pressure when getting up. Walk always in a well-lit room (use night lights in the walls). Avoid area rugs or power cords from appliances in the middle of the walkways. Use a walker or a cane if necessary and consider physical therapy for balance exercise. Get your eyesight checked regularly.  FINANCIAL OVERSIGHT: Supervision, especially oversight when making financial decisions or transactions is also recommended.  HOME SAFETY: Consider the safety of the kitchen when operating appliances like stoves, microwave oven, and blender. Consider having supervision and share cooking responsibilities until no longer able to participate in those. Accidents with firearms and other hazards in the house should be identified and addressed as well.   ABILITY TO BE LEFT ALONE: If patient is unable to contact 911 operator, consider using LifeLine, or when the need is there, arrange for someone to stay with patients. Smoking is a fire hazard, consider supervision or cessation. Risk of wandering should be assessed by caregiver and if detected at any point, supervision and safe proof recommendations should be instituted.  MEDICATION SUPERVISION: Inability to self-administer medication needs to be constantly addressed. Implement a mechanism to ensure safe administration of the  medications.   DRIVING: Regarding driving, in patients with progressive memory problems, driving will be impaired. We advise to have someone else do the driving if trouble finding directions or if minor accidents are reported. Independent driving assessment is available to determine safety of driving.   If you are interested in the driving assessment, you can contact the following:  [employer] in [place]  [place] [phone] or [phone]    [place] refers to food and lifestyle choices that are based on the traditions of countries located on the [employer]. This way of eating has been shown to help prevent certain conditions and improve outcomes for people who have chronic diseases, like kidney disease and heart disease. What are tips for following this plan? Lifestyle  Cook and eat meals together with your family, when possible. Drink enough fluid to keep your urine clear or pale yellow. Be physically active every day. This includes: Aerobic exercise like running or swimming. Leisure activities like gardening, walking, or housework. Get 7-8 hours of sleep each night. If recommended by your health care provider, drink red wine in moderation. This means 1 glass a day for nonpregnant women and 2 glasses a day for men. A glass of wine equals 5 oz (150 mL). Reading food labels  Check the serving size of packaged foods. For foods such as rice and pasta,  the serving size refers to the amount of cooked product, not dry. Check the total fat in packaged foods. Avoid foods that have saturated fat or trans fats. Check the ingredients list for added sugars, such as corn syrup. Shopping  At the grocery store, buy most of your food from the areas near the walls of the store. This includes: Fresh fruits and vegetables (produce). Grains,  beans, nuts, and seeds. Some of these may be available in unpackaged forms or large amounts (in bulk). Fresh seafood. Poultry and eggs. Low-fat dairy products. Buy whole ingredients instead of prepackaged foods. Buy fresh fruits and vegetables in-season from local farmers markets. Buy frozen fruits and vegetables in resealable bags. If you do not have access to quality fresh seafood, buy precooked frozen shrimp or canned fish, such as tuna, salmon, or sardines. Buy small amounts of raw or cooked vegetables, salads, or olives from the deli or salad bar at your store. Stock your pantry so you always have certain foods on hand, such as olive oil, canned tuna, canned tomatoes, rice, pasta, and beans. Cooking  Cook foods with extra-virgin olive oil instead of using butter or other vegetable oils. Have meat as a side dish, and have vegetables or grains as your main dish. This means having meat in small portions or adding small amounts of meat to foods like pasta or stew. Use beans or vegetables instead of meat in common dishes like chili or lasagna. Experiment with different cooking methods. Try roasting or broiling vegetables instead of steaming or sauteing them. Add frozen vegetables to soups, stews, pasta, or rice. Add nuts or seeds for added healthy fat at each meal. You can add these to yogurt, salads, or vegetable dishes. Marinate fish or vegetables using olive oil, lemon juice, garlic, and fresh herbs. Meal planning  Plan to eat 1 vegetarian meal one day each week. Try to work up to 2 vegetarian meals, if possible. Eat seafood 2 or more times a week. Have healthy snacks readily available, such as: Vegetable sticks with hummus. Greek yogurt. Fruit and nut trail mix. Eat balanced meals throughout the week. This includes: Fruit: 2-3 servings a day Vegetables: 4-5 servings a day Low-fat dairy: 2 servings a day Fish, poultry, or lean meat: 1 serving a day Beans and legumes: 2 or more  servings a week Nuts and seeds: 1-2 servings a day Whole grains: 6-8 servings a day Extra-virgin olive oil: 3-4 servings a day Limit red meat and sweets to only a few servings a month What are my food choices? Mediterranean diet Recommended Grains: Whole-grain pasta. Brown rice. Bulgar wheat. Polenta. Couscous. Whole-wheat bread. [place]. Vegetables: Artichokes. Beets. Broccoli. Cabbage. Carrots. Eggplant. Green beans. Chard. Kale. Spinach. Onions. Leeks. Peas. Squash. Tomatoes. Peppers. Radishes. Fruits: Apples. Apricots. Avocado. Berries. Bananas. Cherries. Dates. Figs. Grapes. Lemons. Melon. Oranges. Peaches. Plums. Pomegranate. Meats and other protein foods: Beans. Almonds. Sunflower seeds. Pine nuts. Peanuts. [place]. Salmon. Scallops. Shrimp. [place]. Tilapia. Clams. Oysters. Eggs. Dairy: Low-fat milk. Cheese. Greek yogurt. Beverages: Water. Red wine. Herbal tea. Fats and oils: Extra virgin olive oil. Avocado oil. Grape seed oil. Sweets and desserts: [place] yogurt with honey. Baked apples. Poached pears. Trail mix. Seasoning and other foods: Basil. Cilantro. Coriander. Cumin. Mint. Parsley. Sage. Rosemary. Tarragon. Garlic. Oregano. Thyme. Pepper. Balsalmic vinegar. Tahini. Hummus. Tomato sauce. Olives. Mushrooms. Limit these Grains: Prepackaged pasta or rice dishes. Prepackaged cereal with added sugar. Vegetables: Deep fried potatoes (french fries). Fruits: Fruit canned in syrup. Meats and other protein foods:  Beef. Pork. Lamb. Poultry with skin. Hot dogs. [place] Salines. Dairy: Ice cream. Sour cream. Whole milk. Beverages: Juice. Sugar-sweetened soft drinks. Beer. Liquor and spirits. Fats and oils: Butter. Canola oil. Vegetable oil. Beef fat (tallow). Lard. Sweets and desserts: Cookies. Cakes. Pies. Candy. Seasoning and other foods: Mayonnaise. Premade sauces and marinades. The items listed may not be a complete list. Talk with your dietitian about what dietary choices are right for  you. Summary The Mediterranean diet includes both food and lifestyle choices. Eat a variety of fresh fruits and vegetables, beans, nuts, seeds, and whole grains. Limit the amount of red meat and sweets that you eat. Talk with your health care provider about whether it is safe for you to drink red wine in moderation. This means 1 glass a day for nonpregnant women and 2 glasses a day for men. A glass of wine equals 5 oz (150 mL). This information is not intended to replace advice given to you by your health care provider. Make sure you discuss any questions you have with your health care provider. Document Released: 02/10/2016 Document Revised: 03/14/2016 Document Reviewed: 02/10/2016 Elsevier Interactive Patient Education  2017 [employer].    We have sent a referral to [facility] for your MRI and they will call you directly to schedule your appointment. They are located at [place]. If you need to contact them directly please call [phone].

## 2022-07-26 NOTE — Progress Notes (Unsigned)
Assessment/Plan:   Memory Impairment Charlene Patterson is a very pleasant 77 y.o. RH female seen today in follow up for memory loss. Patient is currently on . MRI brain personally reviewed was remarkable for   with  a history of hypertension, hyperlipidemia, anxiety, depression, CKD stage III,  QTc prolongation, osteoarthritis and a recent diagnosis of Dementia likely due to Alzheimer's Disease seen today in follow up to discuss the 04/17/22 MRI of the brain with contrast to further evaluate possible meningioma.  These were personally reviewed, remarkable for  re-demonstrated densely enhancing mass in the right frontal lobe, which appears to be dural-based, most likely a meningioma.Patient is asymptomatic for these findings.        Follow up in   months. Continue to control mood as per PCP Continue Mirtazapime 7.5 mg nightly Continue memantine 5 mg bid, side effects discussed  Repeat neuropsychological evaluation scheduled for 10/2022 for clarity of diagnosis and disease trajectory  Recommend good control of cardiovascular risk factors.       Subjective:    This patient is accompanied in the office by *** who supplements the history.  Previous records as well as any outside records available were reviewed prior to todays visit. Patient was last seen on ***   Any changes in memory since last visit? Denies any changes. Patient has some difficulty remembering recent conversations and people names. STM worse than LTM . Does not do any brain games  repeats oneself? Occasionally  Disoriented when walking into a room?  Patient denies except occasionally not remembering what patient came to the room for    Leaving objects in unusual places?   I might misplace some things.   Wandering behavior?  denies   Any personality changes since last visit?  denies   Any worsening depression?:  She has a history of depression, but no changes in personality were reported  Hallucinations or paranoia?   denies   Seizures?    denies    Any sleep changes?   Some good and bad night .Reports vivid dreams, One time only she reported REM behavior  Denies sleepwalking   Sleep apnea?   denies   Any hygiene concerns?    denies   Independent of bathing and dressing?  Endorsed  Does the patient needs help with medications? Daughter  is in charge *** Who is in charge of the finances?  Patient is in charge   *** Any changes in appetite?  Patient denies ***   Patient have trouble swallowing?  denies   Does the patient cook? Yes  Any kitchen accidents such as leaving the stove on? Patient denies   Any headaches?   denies   Chronic back pain  denies   Ambulates with difficulty?     denies   Recent falls or head injuries? denies     Unilateral weakness, numbness or tingling?    denies   Any tremors?  denies   Any anosmia?  Patient denies   Any incontinence of urine?  denies   Any bowel dysfunction?  Occasional constipation  Patient lives  with son  Does the patient drive?*** Denies any issue s   04/17/22 MRI brain was remarkable for Re-demonstrated densely enhancing mass in the right frontal lobe, which appears to be dural-based, most likely a meningioma. No significant surrounding T2 hyperintense signal in the adjacent right frontal lobe. 2. The previously described focus of enhancement in the left frontal white matter is not appreciated on this exam.3.  No acute intracranial process.   Recent visit 12/2021 How long did patient have memory difficulties?  "About 7 years, I feel that my memory is worse, I constantly have to remember conversations and keeping appointments ".  She continues to try to do crossword puzzles and word finding, although these has become more difficult.  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 Patient lives with: Her son who has cerebral palsy.  She also lives with her grandson and his wife. Repeats oneself?   Endorsed Disoriented when walking into a room?  Patient denies   Leaving objects in unusual places?  She reports losing objects constantly, and recently she has been leaving her phone "everywhere, including under the food in my car ". Ambulates  with difficulty?   Patient denies, but "I feel that my body is out of shape ". Recent falls?  Patient denies   Any head injuries?  She reports having fallen from the roof when she was a child hitting her head ".  No loss of consciousness " History of seizures?   Patient denies   Wandering behavior?  Patient denies   Patient drives?  She drives short distances, and uses his GPS to drive.  Any mood changes?  Denies Any history of depression?:  Endorsed.  Her husband died in October 10, 2010, and she reports that has never recovered despite using good antidepressants.  She never has done any psychotherapy. Hallucinations?  Patient denies   Paranoia?  Patient denies   Patient reports that has intermittent insomnia, reports vivid dreams (not frightening), no REM behavior  History of sleep apnea?  Patient denies   Any hygiene concerns?  Patient denies   Independent of bathing and dressing?  Endorsed  Does the patient needs help with medications? Son monitors but she has them in a pillbox because otherwise she forgets to take them. Who is in charge of the finances? Grandson  Any changes in appetite?  Patient denies   Patient have trouble swallowing? Patient denies   Does the patient cook?  Patient denies   Any kitchen accidents such as leaving the stove on? Patient denies   Any headaches?  Patient denies   Double vision? Patient denies   Any focal numbness or tingling?  Patient denies   Chronic back pain Patient denies   Unilateral weakness?  Patient denies   Any tremors?  Patient denies   Any history of anosmia?  Denies  Any incontinence of urine?  Patient denies   Any bowel dysfunction?   Patient denies History of heavy alcohol intake?  Patient denies    History of heavy tobacco use?  Patient denies   Family history of dementia?  Denies     Initial visit 01/05/2018, Dr. Delice Lesch I had the pleasure of seeing Charlene Patterson in follow-up in the neurology clinic on 01/05/2017.  The patient was last seen 9 months ago for worsening memory. MMSE in 04/2016 and 06/2015 were 30/30. She continued to report cognitive changes, including getting lost driving, missing bills and medications. She underwent Neuropsychological evaluation last December 2017 which was largely within normal limits. There was no evidence of a neurocognitive disorder. She endorsed significant anxiety and depression, cognitive complaints most likely due to these.    Since her last visit, she reports that things have leveled off, everything has been going great. She denies getting lost driving, no missed bills or medications. She has been keeping active in her garden,  which is helping her mind as well. She reports less stress. She reports migraines are under control. No dizziness, vision changes, focal numbness/tingling/weakness, no falls. Back pain better with PT.    HPI 06/18/2015: This is a 77 yo RH woman with a history of hyperlipidemia, chronic back pain,who presented for worsening memory. She reports that her memory is "not too good," and started noticing changes gradually over the past few years. She reports problems mostly with short-term memory, sometimes she forgets to take her medications and her son would remind her. She misplaces things frequently and has been told that she repeats herself. She left the stove on a few times. She has to write everything down. She denies getting lost driving, and reports she can find her way back if she makes a wrong turn. She lives with her son with cerebral palsy, and denies any missed bill payments. They are planning to move in with her grandson's family soon.    She has a history of migraines since childhood, worse with weather changes and loud sounds  and smells. She has occasional dizziness and gets motion sick quickly. She denies any diplopia, blurred vision, dysarthria, dysphagia. She has right hand and arm numbness and tingling when driving. She has chronic neck and back pain, at times triggering her headaches. She has problems with her rectum, but denies any constipation. No anosmia or tremors. She became tearful reporting that her husband passed away in 09/28/10, and reports mood is "up and down." She denies any history of head injuries or alcohol use. Her younger sister has memory problems and got lost driving at age 46.   Records and images were personally reviewed where available.  I personally reviewed MRI brain without contrast which was normal. TSH and B12 normal.     CT of head and neck  personally reviewed shows CT head: 1. 1 cm speckled calcified mass in the right frontal lobe, it which appears to be dural-based and focal hyperostosis of the calvarium, it likely represents a small meningioma. Differential includes other cortical based tumors.  No evidence of acute intracranial hemorrhage or infarct.  CT cervical spine: 1.  No acute fracture or traumatic subluxation. 2.  Paraspinal soft tissues are unremarkable. 3. Degenerate disc disease prominent at C3-C4 and C4-C5 with uncovertebral joint arthropathy. Mild right neural foraminal stenosis at C3-C4   MRI  brain 03/22/22 personally reviewed shows   1. Enhancing mass right frontal lobe appears extra-axial in compatible with meningioma. No brain edema 2. Faintly enhancing 3 mm lesion left frontal white matter. Mild associated susceptibility. Appearance is nonspecific. 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 Metastatic disease possible.    PREVIOUS MEDICATIONS: Donepezil (QTcI prolongation/syncope).  PREVIOUS MEDICATIONS:   CURRENT MEDICATIONS:  Outpatient Encounter Medications as of 07/26/2022  Medication Sig    acetaminophen (TYLENOL) 650 MG CR tablet Take 1,300 mg by mouth in the morning and at bedtime.   atorvastatin (LIPITOR) 40 MG tablet Take 1 tablet (40 mg total) by mouth daily.   cyanocobalamin 1000 MCG tablet Take 1,000 mcg by mouth daily.   DULoxetine (CYMBALTA) 30 MG capsule Take 1 capsule (30 mg total) by mouth daily.   EXCEDRIN TENSION HEADACHE 500-65 MG TABS per tablet Take 1 tablet by mouth every 6 (six) hours as needed (for headaches).   famotidine (PEPCID) 20 MG tablet TAKE 1 TABLET(20 MG) BY MOUTH AT BEDTIME   lubiprostone (AMITIZA) 24 MCG capsule Take 1 capsule (24  mcg total) by mouth 2 (two) times daily with a meal.   memantine (NAMENDA) 5 MG tablet Take 1 tablet (5 mg total) by mouth 2 (two) times daily.   metoprolol succinate (TOPROL XL) 25 MG 24 hr tablet Take 1 tablet by mouth to suppress increased heart rates   mirtazapine (REMERON) 7.5 MG tablet Take 1 tablet (7.5 mg total) by mouth at bedtime.   NON FORMULARY Take 1 tablet by mouth See admin instructions. Head Care Protective Health tablets- Take 1 tablet by mouth once a day as needed for neuro health   oxyCODONE (OXY IR/ROXICODONE) 5 MG immediate release tablet Take 1 tablet (5 mg total) by mouth every 8 (eight) hours as needed for severe pain.   pantoprazole (PROTONIX) 40 MG tablet Take 1 tablet (40 mg total) by mouth daily.   Potassium 99 MG TABS Take 99 mg by mouth at bedtime.   VASCEPA 1 g capsule TAKE 2 CAPSULES TWICE DAILY (Patient taking differently: Take 2 g by mouth in the morning and at bedtime.)   VITAMIN D3 SUPER STRENGTH 50 MCG (2000 UT) CAPS Take 2,000 Units by mouth at bedtime.   Zinc 50 MG TABS Take 25 mg by mouth at bedtime.   No facility-administered encounter medications on file as of 07/26/2022.       09/02/2018    1:45 PM 08/27/2017    2:37 PM 04/17/2016    4:00 PM  MMSE - Mini Mental State Exam  Orientation to time '5 5 5  '$ Orientation to Place '5 5 5  '$ Registration '3 3 3  '$ Attention/ Calculation '5 5 5   '$ Recall '3 2 3  '$ Language- name 2 objects '2 2 2  '$ Language- repeat '1 1 1  '$ Language- follow 3 step command '3 3 3  '$ Language- read & follow direction '1 1 1  '$ Write a sentence '1 1 1  '$ Copy design '1 1 1  '$ Total score '30 29 30      '$ 01/23/2022   12:00 PM  Montreal Cognitive Assessment   Visuospatial/ Executive (0/5) 2  Naming (0/3) 3  Attention: Read list of digits (0/2) 2  Attention: Read list of letters (0/1) 1  Attention: Serial 7 subtraction starting at 100 (0/3) 2  Language: Repeat phrase (0/2) 1  Language : Fluency (0/1) 0  Abstraction (0/2) 0  Delayed Recall (0/5) 2  Orientation (0/6) 6  Total 19  Adjusted Score (based on education) 19    Objective:     PHYSICAL EXAMINATION:    VITALS:   Vitals:   07/26/22 1512  Height: 5' 2.5" (1.588 m)    GEN:  The patient appears stated age and is in NAD. HEENT:  Normocephalic, atraumatic.   Neurological examination:  General: NAD, well-groomed, appears stated age. Orientation: The patient is alert. Oriented to person, place and date Cranial nerves: There is good facial symmetry.The speech is fluent and clear. No aphasia or dysarthria. Fund of knowledge is appropriate. Recent and remote memory are impaired. Attention and concentration are reduced.  Able to name objects and repeat phrases.  Hearing is intact to conversational tone.    Sensation: Sensation is intact to light touch throughout Motor: Strength is at least antigravity x4. DTR's 2/4 in UE/LE     Movement examination: Tone: There is normal tone in the UE/LE Abnormal movements:  no tremor.  No myoclonus.  No asterixis.   Coordination:  There is no decremation with RAM's. Normal finger to nose  Gait and Station: The patient  has no difficulty arising out of a deep-seated chair without the use of the hands. The patient's stride length is good.  Gait is cautious and narrow.    Thank you for allowing [place] the opportunity to participate in the care of this nice patient. Please  do not hesitate to contact us for any questions or concerns.   Total time spent on today's visit was *** minutes dedicated to this patient today, preparing to see patient, examining the patient, ordering tests and/or medications and counseling the patient, documenting clinical information in the EHR or other health record, independently interpreting results and communicating results to the patient/family, discussing treatment and goals, answering patient's questions and coordinating care.  Cc:  Janith Lima, MD  Sharene Butters 07/26/2022 3:14 PM

## 2022-07-31 DIAGNOSIS — H25813 Combined forms of age-related cataract, bilateral: Secondary | ICD-10-CM | POA: Diagnosis not present

## 2022-07-31 DIAGNOSIS — H10413 Chronic giant papillary conjunctivitis, bilateral: Secondary | ICD-10-CM | POA: Diagnosis not present

## 2022-07-31 DIAGNOSIS — H33321 Round hole, right eye: Secondary | ICD-10-CM | POA: Diagnosis not present

## 2022-07-31 DIAGNOSIS — H0102B Squamous blepharitis left eye, upper and lower eyelids: Secondary | ICD-10-CM | POA: Diagnosis not present

## 2022-07-31 DIAGNOSIS — H0102A Squamous blepharitis right eye, upper and lower eyelids: Secondary | ICD-10-CM | POA: Diagnosis not present

## 2022-08-09 ENCOUNTER — Ambulatory Visit: Payer: Medicare HMO | Attending: Cardiovascular Disease | Admitting: Cardiovascular Disease

## 2022-08-09 ENCOUNTER — Encounter: Payer: Self-pay | Admitting: Cardiovascular Disease

## 2022-08-09 VITALS — BP 110/62 | HR 60 | Ht 62.0 in | Wt 135.6 lb

## 2022-08-09 DIAGNOSIS — R55 Syncope and collapse: Secondary | ICD-10-CM

## 2022-08-09 DIAGNOSIS — I2510 Atherosclerotic heart disease of native coronary artery without angina pectoris: Secondary | ICD-10-CM | POA: Diagnosis not present

## 2022-08-09 DIAGNOSIS — I471 Supraventricular tachycardia, unspecified: Secondary | ICD-10-CM

## 2022-08-09 HISTORY — DX: Supraventricular tachycardia, unspecified: I47.10

## 2022-08-09 NOTE — Progress Notes (Signed)
Cardiology Office Note:    Date:  08/09/2022   ID:  Charlene Patterson, DOB 07-28-1945, MRN [id number]  PCP:  Charlene Lima, MD   [facility] Cardiologist:   Charlene Patterson     Referring MD: Charlene Lima, MD   Chief Complaint  Patient presents with   Loss of Consciousness   Hyperlipidemia    History of Present Illness: Oct. 23, 2023    Charlene Patterson is a 77 y.o. female with a hx of HLD,  And a recent episode of syncope   Seen with Granddaughter, Charlene Patterson  Has a hx of dementia  Was seen in the ER  Was found to have long QT on ECG  3 weeks ago .  Pt had slipped on the stairs the day prior to the syncopal episode  Was up doing household chores, felt nauseated  ( had not eaten that day ) .   Stood up from her chair to go to the couch, but passed out on the way to the couch  Has a sore back from the falls   Was found to have a prolonged QT .  Her neurologist took her off 1 memory medication that might be contributing to long QT  ( Donepezil )   Is part  [place],  is very tan    Feb. 7, 2024 The event monitor showed 23 episodes of supraventricular tachycardia.  The longest episode lasted 8.4 seconds with a maximum heart rate of 200 bpm.  The average heart rate was 150. We started metoprolol   Is doing better.  Has a left carotid bruit . Has moderate carotid disease on the left  Anticipate repeating the carotid duplex in a year or so    Past Medical History:  Diagnosis Date   Allergy    Anxiety    Arthritis    "left foot" (05/26/2014)   Chronic back pain    Chronic neck pain    Depression    GERD (gastroesophageal reflux disease)    History of chicken pox    History of colon polyps    History of hiatal hernia    HLD (hyperlipidemia)    IBS (irritable bowel syndrome)    Lower GI bleeding    Migraines    "monthly usually; weekly lately" (05/26/2014)   Sciatica    Thoracic compression fracture (HCC)     Past Surgical History:  Procedure  Laterality Date   ABDOMINAL HYSTERECTOMY  ~ 1978   "partial"   APPENDECTOMY  ~ [place]  07/04/2003   last 2005   POLYPECTOMY     UPPER GASTROINTESTINAL ENDOSCOPY      Current Medications: Current Meds  Medication Sig   acetaminophen (TYLENOL) 650 MG CR tablet Take 1,300 mg by mouth in the morning and at bedtime.   atorvastatin (LIPITOR) 40 MG tablet Take 1 tablet (40 mg total) by mouth daily.   cyanocobalamin 1000 MCG tablet Take 1,000 mcg by mouth daily.   DULoxetine (CYMBALTA) 30 MG capsule Take 1 capsule (30 mg total) by mouth daily.   EXCEDRIN TENSION HEADACHE 500-65 MG TABS per tablet Take 1 tablet by mouth every 6 (six) hours as needed (for headaches).   famotidine (PEPCID) 20 MG tablet TAKE 1 TABLET(20 MG) BY MOUTH AT BEDTIME   lubiprostone (AMITIZA) 24 MCG capsule Take 1 capsule (24 mcg total) by mouth 2 (two) times daily with a meal.   memantine (NAMENDA)  5 MG tablet Take 1 tablet (5 mg total) by mouth 2 (two) times daily.   metoprolol succinate (TOPROL XL) 25 MG 24 hr tablet Take 1 tablet by mouth to suppress increased heart rates   mirtazapine (REMERON) 7.5 MG tablet Take 1 tablet (7.5 mg total) by mouth at bedtime.   NON FORMULARY Take 1 tablet by mouth See admin instructions. Head Care Protective Health tablets- Take 1 tablet by mouth once a day as needed for neuro health   pantoprazole (PROTONIX) 40 MG tablet Take 1 tablet (40 mg total) by mouth daily.   Potassium 99 MG TABS Take 99 mg by mouth at bedtime.   VASCEPA 1 g capsule TAKE 2 CAPSULES TWICE DAILY (Patient taking differently: 1 g 2 (two) times daily.)   VITAMIN D3 SUPER STRENGTH 50 MCG (2000 UT) CAPS Take 2,000 Units by mouth at bedtime.   Zinc 50 MG TABS Take 25 mg by mouth at bedtime.     Allergies:   Patient has no known allergies.   Social History   Socioeconomic History   Marital status: Widowed    Spouse name: Not on file   Number of children: 2   Years of  education: 10   Highest education level: Not on file  Occupational History   Occupation: Retired  Tobacco Use   Smoking status: Never   Smokeless tobacco: Never  Vaping Use   Vaping Use: Never used  Substance and Sexual Activity   Alcohol use: No   Drug use: No   Sexual activity: Never  Other Topics Concern   Not on file  Social History Narrative   Regular exercise-no   Caffeine Use-yes   Social Determinants of Health   Financial Resource Strain: Low Risk  (10/10/2021)   Overall Financial Resource Strain (CARDIA)    Difficulty of Paying Living Expenses: Not hard at all  Food Insecurity: No Food Insecurity (10/10/2021)   Hunger Vital Sign    Worried About Running Out of Food in the Last Year: Never true    Ran Out of Food in the Last Year: Never true  Transportation Needs: No Transportation Needs (10/10/2021)   PRAPARE - Hydrologist (Medical): No    Lack of Transportation (Non-Medical): No  Physical Activity: Insufficiently Active (10/10/2021)   Exercise Vital Sign    Days of Exercise per Week: 3 days    Minutes of Exercise per Session: 30 min  Stress: No Stress Concern Present (10/10/2021)   [place]    Feeling of Stress : Not at all  Social Connections: Moderately Integrated (10/10/2021)   Social Connection and Isolation Panel [NHANES]    Frequency of Communication with Friends and Family: Twice a week    Frequency of Social Gatherings with Friends and Family: Twice a week    Attends Religious Services: More than 4 times per year    Active Member of [employer] or Organizations: Yes    Attends Archivist Meetings: 1 to 4 times per year    Marital Status: Widowed     Family History: The patient's family history includes Cancer in her brother; Cerebral palsy in her son; Colon polyps in her sister and another family member; Dementia in her sister; Diabetes in her brother,  sister, and sister; Heart attack in her brother, brother, and father; Heart disease in her brother, father, maternal grandmother, maternal uncle, and mother; Irritable bowel syndrome in her sister; Migraines in  her paternal grandfather; Prostate cancer in her father; Stomach cancer (age of onset: 71) in her sister. There is no history of Rectal cancer or Colon cancer.  ROS:   Please see the history of present illness.     All other systems reviewed and are negative.  EKGs/Labs/Other Studies Reviewed:    The following studies were reviewed today:  EKG:    Recent Labs: 03/22/2022: Hemoglobin 12.9; Platelets 296 07/13/2022: ALT 24; BUN 25; Creatinine, Ser 1.10; Magnesium 2.1; Potassium 4.2; Sodium 140; TSH 2.96  Recent Lipid Panel    Component Value Date/Time   CHOL 162 01/10/2022 1504   TRIG 174.0 (H) 01/10/2022 1504   HDL 42.90 01/10/2022 1504   CHOLHDL 4 01/10/2022 1504   VLDL 34.8 01/10/2022 1504   LDLCALC 85 01/10/2022 1504   LDLDIRECT 56.0 05/20/2020 1628     Risk Assessment/Calculations:                Physical Exam:    Physical Exam: Blood pressure 110/62, pulse 60, height '5\' 2"'$  (1.575 m), weight 135 lb 9.6 oz (61.5 kg), SpO2 99 %.      GEN:  elderly female, thin  in no acute distress HEENT: Normal NECK: No JVD;  + left carotid bruit  LYMPHATICS: No lymphadenopathy CARDIAC: RRR , no murmurs, rubs, gallops RESPIRATORY:  Clear to auscultation without rales, wheezing or rhonchi  ABDOMEN: Soft, non-tender, non-distended MUSCULOSKELETAL:  No edema; No deformity  SKIN: Warm and dry NEUROLOGIC:  Alert and oriented x 3     ASSESSMENT:    1. Syncope, unspecified syncope type   2. SVT (supraventricular tachycardia)   3. Mild coronary artery disease     PLAN:      Syncope: Event monitor revealed brief episode of supraventricular tachycardia lasting 8.4 seconds.  It is possible that she had an episode of SVT that lasted longer than that.  We started her on  Toprol-XL 25 mg a day.  She has not had any episodes of syncope since that time.  Will continue to follow along.  2.  Moderate left carotid artery disease: As part of her syncope workup we performed carotid duplex.  She also has a left carotid bruit.  She has moderate disease on the left.  Will plan on repeating her carotid duplex study in a year or so.  Her LDL goal is between 50 and 70.  Continue to work on diet , exercise .  Cont atorvastatin .    Medication Adjustments/Labs and Tests Ordered: Current medicines are reviewed at length with the patient today.  Concerns regarding medicines are outlined above.  No orders of the defined types were placed in this encounter.  No orders of the defined types were placed in this encounter.   Patient Instructions  Medication Instructions:  Your physician recommends that you continue on your current medications as directed. Please refer to the Current Medication list given to you today. *If you need a refill on your cardiac medications before your next appointment, please call your pharmacy*  Lab Work: NONE If you have labs (blood work) drawn today and your tests are completely normal, you will receive your results only by: [place] (if you have MyChart) OR A paper copy in the mail If you have any lab test that is abnormal or we need to change your treatment, we will call you to review the results.   Testing/Procedures: NONE   Follow-Up: At [facility], you and your health needs are our  priority.  As part of our continuing mission to provide you with exceptional heart care, we have created designated Provider Care Teams.  These Care Teams include your primary Cardiologist (physician) and Advanced Practice Providers (APPs -  Physician Assistants and Nurse Practitioners) who all work together to provide you with the care you need, when you need it.  We recommend signing up for the patient portal called "MyChart".  Sign up  information is provided on this After Visit Summary.  MyChart is used to connect with patients for Virtual Visits (Telemedicine).  Patients are able to view lab/test results, encounter notes, upcoming appointments, etc.  Non-urgent messages can be sent to your provider as well.   To learn more about what you can do with MyChart, go to [email or web address].    Your next appointment:   6 month(s)  Provider:   Christen Bame, NP     Then, Mertie Moores, MD will plan to see you again in 1 year(s).       Signed, Mertie Moores, MD  08/09/2022 6:02 PM    [place]

## 2022-08-09 NOTE — Patient Instructions (Signed)
Medication Instructions:  Your physician recommends that you continue on your current medications as directed. Please refer to the Current Medication list given to you today. *If you need a refill on your cardiac medications before your next appointment, please call your pharmacy*  Lab Work: NONE If you have labs (blood work) drawn today and your tests are completely normal, you will receive your results only by: [place] (if you have MyChart) OR A paper copy in the mail If you have any lab test that is abnormal or we need to change your treatment, we will call you to review the results.   Testing/Procedures: NONE   Follow-Up: At [facility], you and your health needs are our priority.  As part of our continuing mission to provide you with exceptional heart care, we have created designated Provider Care Teams.  These Care Teams include your primary Cardiologist (physician) and Advanced Practice Providers (APPs -  Physician Assistants and Nurse Practitioners) who all work together to provide you with the care you need, when you need it.  We recommend signing up for the patient portal called "MyChart".  Sign up information is provided on this After Visit Summary.  MyChart is used to connect with patients for Virtual Visits (Telemedicine).  Patients are able to view lab/test results, encounter notes, upcoming appointments, etc.  Non-urgent messages can be sent to your provider as well.   To learn more about what you can do with MyChart, go to [email or web address].    Your next appointment:   6 month(s)  Provider:   Christen Bame, NP     Then, Mertie Moores, MD will plan to see you again in 1 year(s).

## 2022-08-28 ENCOUNTER — Other Ambulatory Visit: Payer: Self-pay | Admitting: Internal Medicine

## 2022-08-28 DIAGNOSIS — F32A Depression, unspecified: Secondary | ICD-10-CM

## 2022-08-28 DIAGNOSIS — M159 Polyosteoarthritis, unspecified: Secondary | ICD-10-CM

## 2022-08-28 DIAGNOSIS — F339 Major depressive disorder, recurrent, unspecified: Secondary | ICD-10-CM

## 2022-10-02 ENCOUNTER — Encounter: Payer: Self-pay | Admitting: Psychology

## 2022-10-02 ENCOUNTER — Ambulatory Visit: Payer: Medicare HMO

## 2022-10-02 ENCOUNTER — Ambulatory Visit: Payer: Medicare HMO | Admitting: Psychology

## 2022-10-02 DIAGNOSIS — F418 Other specified anxiety disorders: Secondary | ICD-10-CM | POA: Insufficient documentation

## 2022-10-02 DIAGNOSIS — F03A4 Unspecified dementia, mild, with anxiety: Secondary | ICD-10-CM

## 2022-10-02 DIAGNOSIS — G8929 Other chronic pain: Secondary | ICD-10-CM | POA: Insufficient documentation

## 2022-10-02 DIAGNOSIS — F03A0 Unspecified dementia, mild, without behavioral disturbance, psychotic disturbance, mood disturbance, and anxiety: Secondary | ICD-10-CM

## 2022-10-02 DIAGNOSIS — F330 Major depressive disorder, recurrent, mild: Secondary | ICD-10-CM | POA: Diagnosis not present

## 2022-10-02 DIAGNOSIS — R4189 Other symptoms and signs involving cognitive functions and awareness: Secondary | ICD-10-CM

## 2022-10-02 HISTORY — DX: Unspecified dementia, mild, without behavioral disturbance, psychotic disturbance, mood disturbance, and anxiety: F03.A0

## 2022-10-02 NOTE — Progress Notes (Signed)
NEUROPSYCHOLOGICAL EVALUATION [place]. [facility] Department of Neurology  Date of Evaluation: October 02, 2022  Reason for Referral:   Charlene Patterson is a 77 y.o. right-handed Caucasian female referred by Sharene Butters, PA-C, to characterize her current cognitive functioning and assist with diagnostic clarity and treatment planning in the context of subjective cognitive decline.   Assessment and Plan:   Clinical Impression(s): Charlene Patterson pattern of performance is suggestive of a primary impairment surrounding all aspects of verbal memory. Additional performance variability was exhibited across processing speed, complex attention, executive functioning, and semantic fluency. Performances were appropriate relative to age-matched peers and premorbid intellectual estimations across basic attention, safety/judgment, receptive language, phonemic fluency, confrontation naming, visuospatial abilities, and visual memory. Functionally, Charlene Patterson requires assistance from her family surrounding medication management and acknowledged missing some bills or otherwise paying them late. She also acknowledged a few instances where she got lost and was reliant on her GPS to get her home. Diagnostically, she is likely on the border between mild and major neurocognitive disorders. However, given that memory impairment does appear to be directly impacting day-to-day functioning, I feel she best meets diagnostic criteria for a Major Neurocognitive Disorder ("dementia"). Given her overall cognitive profile, she would be towards the mild end of this spectrum presently.   The etiology of her mild dementia presentation is unclear. The presence of an underlying neurodegenerative illness, namely Alzheimer's disease, remains a possibility. Across verbal memory tasks, Charlene Patterson did not benefit from the repeated provision of information, exhibited normative impairments across delayed recall trials  (including 0% retention across a list learning task), and performed poorly across yes/no recognition trials. This suggests concerns for rapid forgetting and an evolving storage impairment, both of which can be hallmark characteristics of this illness. Whereas the majority of her cognitive profile exhibited stability relative to ger 2017 evaluation, verbal memory did exhibit evidence for objective decline. It is necessary to highlight that her delayed recall of visual information was normatively appropriate and performance across all non-memory domains commonly implicated early on in this illness (namely semantic fluency, confrontation naming, and visuospatial abilities) were also largely normatively appropriate. While this could suggest the very early stages of this illness, the presence of Alzheimer's disease cannot currently be stated with confidence. Monitoring over time to assess for negative progression will be vital moving forward. Despite concern for some REM sleep behaviors, Charlene Patterson does not exhibit other behavioral characteristics of Lewy body disease. Testing patterns are also not consistent with this illness presently.   Outside of a neurodegenerative cause, Charlene Patterson did report acute symptoms of mild depression and severe anxiety. She also reported chronic pain, frequent headaches (including migraine experiences), and ongoing sleep dysfunction. Especially in combination, these variables can certainly compromise cognitive functioning and create variability and/or impairment in the exact areas where Charlene Patterson exhibited weakness. This also remains a plausible explanation to be monitored over time.    Recommendations: A repeat neuropsychological evaluation in 18 months (or sooner if functional decline is noted) is recommended to assess the trajectory of future cognitive decline should it occur. This will also aid in future efforts towards improved diagnostic clarity.  Charlene Patterson has already been  prescribed a medication aimed to address memory loss and concerns surrounding Alzheimer's disease (i.e., memantine/Namenda). She is encouraged to continue taking this medication as prescribed. It is important to highlight that this medication has been shown to slow functional decline in some individuals. There is no current treatment which  can stop or reverse cognitive decline when caused by a neurodegenerative illness.   A combination of medication and psychotherapy has been shown to be most effective at treating symptoms of anxiety and depression. As such, Charlene Patterson is encouraged to speak with her prescribing physician regarding medication adjustments to optimally manage these symptoms. Likewise, Charlene Patterson could also consider engaging in short-term psychotherapy to address symptoms of psychiatric distress. She would benefit from an active and collaborative therapeutic environment, rather than one purely supportive in nature. Recommended treatment modalities include Cognitive Behavioral Therapy (CBT) or Acceptance and Commitment Therapy (ACT).  Performance across neurocognitive testing is not a strong predictor of an individual's safety operating a motor vehicle. Should her family wish to pursue a formalized driving evaluation, they could reach out to the following agencies: [employer] in [place]: [phone] Driver Rehabilitative Services: [facility]: [place]: [phone] or [phone]  It will be important for Charlene Patterson to have another person with her when in situations where she may need to process information, weigh the pros and cons of different options, and make decisions, in order to ensure that she fully understands and recalls all information to be considered.  If not already done, Charlene Patterson and her family may want to discuss her wishes regarding durable power of attorney and medical decision making, so that she can have  input into these choices. If they require legal assistance with this, long-term care resource access, or other aspects of estate planning, they could reach out to [facility] at [phone] for a free consultation. Additionally, they may wish to discuss future plans for caretaking and seek out community options for in home/residential care should they become necessary.  Ms. Mckoy is encouraged to attend to lifestyle factors for brain health (e.g., regular physical exercise, good nutrition habits and consideration of the MIND-DASH diet, regular participation in cognitively-stimulating activities, and general stress management techniques), which are likely to have benefits for both emotional adjustment and cognition. In fact, in addition to promoting good general health, regular exercise incorporating aerobic activities (e.g., brisk walking, jogging, cycling, etc.) has been demonstrated to be a very effective treatment for depression and stress, with similar efficacy rates to both antidepressant medication and psychotherapy. Optimal control of vascular risk factors (including safe cardiovascular exercise and adherence to dietary recommendations) is encouraged. Continued participation in activities which provide mental stimulation and social interaction is also recommended.   Important information should be provided to Ms. Leja in written format in all instances. This information should be placed in a highly frequented and easily visible location within her home to promote recall. External strategies such as written notes in a consistently used memory journal, visual and nonverbal auditory cues such as a calendar on the refrigerator or appointments with alarm, such as on a cell phone, can also help maximize recall.  To address problems with processing speed, she may wish to consider:   -Ensuring that she is alerted when essential material or instructions are being presented   -Adjusting the speed at  which new information is presented   -Allowing for more time in comprehending, processing, and responding in conversation   -Repeating and paraphrasing instructions or conversations aloud  To address problems with fluctuating attention and/or executive dysfunction, she may wish to consider:   -Avoiding external distractions when needing to concentrate   -Limiting exposure to fast paced environments with multiple sensory demands   -Writing down complicated information and using checklists   -  Attempting and completing one task at a time (i.e., no multi-tasking)   -Verbalizing aloud each step of a task to maintain focus   -Taking frequent breaks during the completion of steps/tasks to avoid fatigue   -Reducing the amount of information considered at one time   -Scheduling more difficult activities for a time of day where she is usually most alert  Review of Records:   Ms. Blankenbaker was seen by [facility] Marland KitchenEllouise Newer, M.D.) on 06/18/2015 for an evaluation of memory loss. At that time, she described her memory as "not too good," noting concerns for progressive decline over the past several years. Examples included forgetting to take her medications, frequently misplacing things, being repetitive in conversation, and leaving the stove turned on a few times. There is a long history of migraine headaches dating back to childhood. Depressed mood was also noted, stemming from her husband's passing in 2012. Performance on a brief cognitive screening instrument (MMSE) was 30/30. Ultimately, Ms. Cress was referred for a comprehensive neuropsychological evaluation to characterize her cognitive abilities and to assist with diagnostic clarity and treatment planning.   She completed a comprehensive neuropsychological evaluation Kandis Nab, Psy.D.) on 05/29/2016. Results were largely within normal limits related to age-matched peers and premorbid intellectual abilities. An isolated impairment was  noted across phonemic fluency. At that time, it was felt that subjective cognitive dysfunction may be better caused by psychiatric distress and other psychosocial stressors.  She followed up with Dr. Delice Lesch on 01/05/2017. However, she was then not seen again until meeting with Ms. Sharene Butters, PA-C, on 01/23/2022 with concerns surrounding progressive cognitive impairment. Similar examples surrounding short-term memory impairment were described. However, they did appear more pronounced and more frequent in nature, with evidence for decline over time. Functionally, she has assistance with medication management and reminders to take medications. She has also become more reliant on her GPS while driving. Performance on a brief cognitive screening instrument (MOCA) was 19/30. As such, Ms. Signor was referred for a repeat neuropsychological evaluation to characterize her cognitive abilities and assist with diagnostic clarity and treatment planning.   Brain MRI on 07/01/2015 was unremarkable. Brain MRI on 02/03/2022 revealed mild to moderate generalized cerebral atrophy. Brain MRI on 03/22/2022 revealed a densely enhancing mass in the right frontal lobe measuring 11 x 7 mm, compatible with a meningioma. It also revealed a faintly enhancing 3 mm lesion in the left frontal white matter. Brain MRI on 04/18/2022 was stable with regard to her suspected meningioma. The previously mentioned left frontal abnormality was not seen across this follow-up exam.   Past Medical History:  Diagnosis Date   Allergic rhinitis 05/25/2014   Arthritis    "left foot" (05/26/2014)   Chronic back pain    Chronic idiopathic constipation 07/13/2022   Chronic neck pain    Chronic renal disease, stage 3, moderately decreased glomerular filtration rate (GFR) between 30-59 mL/min/1.73 square meter 02/05/2019   Estimated Creatinine Clearance: 43.9 mL/min (by C-G formula based on SCr of 0.97 mg/dL).      Estimated Creatinine Clearance: 34.9  mL/min (by C-G formula based on SCr of 1.1 mg/dL).     Estimated Creatinine Clearance: 37.9 mL/min (by C-G formula based on SCr of 1.1 mg/dL).    Colitis 05/25/2014   Estrogen deficiency 07/28/2021   Frequent headaches 08/08/2018   Frequent PVCs 10/29/2014   Nuclear stress EF: 55%.  There was no ST segment deviation noted during stress.  The study is normal.  This is a  low risk study.  The left ventricular ejection fraction is normal (55-65%).   GERD (gastroesophageal reflux disease)    History of chicken pox    History of colon polyps    History of hiatal hernia    Hyperlipidemia with target LDL less than 130 10/29/2014   The 10-year ASCVD risk score (Arnett DK, et al., 2019) is: 21.8%    Values used to calculate the score:      Age: 89 years      Sex: Female      Is Non-Hispanic African American: No      Diabetic: Yes      Tobacco smoker: No      Systolic Blood Pressure: [id number] mmHg      Is BP treated: No      HDL Cholesterol: 42.9 mg/dL      Total Cholesterol: 162 mg/dL   Hypertriglyceridemia 05/11/2015   IBS (irritable bowel syndrome)    Iron deficiency anemia due to chronic blood loss 07/30/2017   Estimated Creatinine Clearance: 39.9 mL/min (by C-G formula based on SCr of 1.1 mg/dL).   Lower GI bleeding    Major depressive disorder 12/13/2012   Memory loss 03/29/2022   Meningioma 04/27/2022   Migraines    "monthly usually; weekly lately" (05/26/2014)   Osteoporosis 12/13/2012   Other dietary vitamin B12 deficiency anemia 02/05/2019   Prolonged QT interval 04/24/2022   Sciatica    Situational anxiety    Spinal stenosis in cervical region 08/14/2019   Spinal stenosis of lumbar region at multiple levels 05/25/2014   SVT (supraventricular tachycardia) 08/09/2022   Syncope and collapse 04/24/2022   Thoracic compression fracture    Vitamin D deficiency 09/24/2014    Past Surgical History:  Procedure Laterality Date   ABDOMINAL HYSTERECTOMY  ~ 1978   "partial"   APPENDECTOMY  ~ [place]  07/04/2003   last 2005   POLYPECTOMY     UPPER GASTROINTESTINAL ENDOSCOPY      Current Outpatient Medications:    acetaminophen (TYLENOL) 650 MG CR tablet, Take 1,300 mg by mouth in the morning and at bedtime., Disp: , Rfl:    atorvastatin (LIPITOR) 40 MG tablet, Take 1 tablet (40 mg total) by mouth daily., Disp: 90 tablet, Rfl: 1   cyanocobalamin 1000 MCG tablet, Take 1,000 mcg by mouth daily., Disp: , Rfl:    EXCEDRIN TENSION HEADACHE 500-65 MG TABS per tablet, Take 1 tablet by mouth every 6 (six) hours as needed (for headaches)., Disp: , Rfl:    famotidine (PEPCID) 20 MG tablet, TAKE 1 TABLET(20 MG) BY MOUTH AT BEDTIME, Disp: 90 tablet, Rfl: 3   lubiprostone (AMITIZA) 24 MCG capsule, Take 1 capsule (24 mcg total) by mouth 2 (two) times daily with a meal., Disp: 180 capsule, Rfl: 1   memantine (NAMENDA) 5 MG tablet, Take 1 tablet (5 mg total) by mouth 2 (two) times daily., Disp: 60 tablet, Rfl: 11   metoprolol succinate (TOPROL XL) 25 MG 24 hr tablet, Take 1 tablet by mouth to suppress increased heart rates, Disp: 30 tablet, Rfl: 12   mirtazapine (REMERON) 7.5 MG tablet, TAKE 1 TABLET (7.5 MG TOTAL) BY MOUTH AT BEDTIME., Disp: 90 tablet, Rfl: 0   NON FORMULARY, Take 1 tablet by mouth See admin instructions. Head Care Protective Health tablets- Take 1 tablet by mouth once a day as needed for neuro health, Disp: , Rfl:    oxyCODONE (OXY IR/ROXICODONE) 5 MG  immediate release tablet, Take 1 tablet (5 mg total) by mouth every 8 (eight) hours as needed for severe pain. (Patient not taking: Reported on 08/09/2022), Disp: 90 tablet, Rfl: 0   pantoprazole (PROTONIX) 40 MG tablet, Take 1 tablet (40 mg total) by mouth daily., Disp: 90 tablet, Rfl: 0   Potassium 99 MG TABS, Take 99 mg by mouth at bedtime., Disp: , Rfl:    VASCEPA 1 g capsule, TAKE 2 CAPSULES TWICE DAILY (Patient taking differently: 1 g 2 (two) times daily.), Disp: 360 capsule, Rfl: 1   VITAMIN D3  SUPER STRENGTH 50 MCG (2000 UT) CAPS, Take 2,000 Units by mouth at bedtime., Disp: , Rfl:    Zinc 50 MG TABS, Take 25 mg by mouth at bedtime., Disp: , Rfl:   Clinical Interview:   The following information was obtained during a clinical interview with Ms. Vanorman and her granddaughter-in-law prior to cognitive testing.  Cognitive Symptoms: Decreased short-term memory: Endorsed. Primary examples included trouble recalling recent conversations and names and frequently misplacing objects in her environment. She reported difficulties being present for the past year or so (despite her stating in 2016 that difficulties had been present for several years at that time) and did describe progressive cognitive decline. Her granddaughter-in-law did not describe prominent repetition in conversation and noting that Ms. Cabeza will have good and bad days.  Decreased long-term memory: Denied. Decreased attention/concentration: Endorsed. She reported ongoing trouble with sustained attention and increased ease of distractibility.  Reduced processing speed: Endorsed. Difficulties with executive functions: Endorsed. She reported trouble with multitasking and organization. Her granddaughter-in-law noted some personality changes in that Ms. Barnhill seems more emotional and more easily brought to tears lately. They both denied trouble with aggression or other behavioral dysregulation/disinhibition.   Difficulties with emotion regulation: Denied. Difficulties with receptive language: Denied. Difficulties with word finding: Endorsed. Decreased visuoperceptual ability: Denied.  Difficulties completing ADLs: Endorsed. Family will organize her pillbox and her son will provide reminders for her to take her medications. Despite these reminders, she reported still forgetting from time to time. She noted some trouble with financial management and that she has forgotten to pay some bills or paid them late. She continues to drive  without significant issue. However, she did acknowledge a few times where she has gotten turned around and relied on her GPS to get her home.   Additional Medical History: History of traumatic brain injury/concussion: Unclear. She reported several falls and other knocks to her head over the years, especially when she was younger. At one point, she described falling off a house, as well as out of a tree. The former was noted to cause significant and lasting orthopedic distress. She was unclear if she had ever been formally diagnosed with a concussion.  History of stroke: Denied. History of seizure activity: Denied. History of known exposure to toxins: Denied. Symptoms of chronic pain: Endorsed. She reported cervical and lumbar spine discomfort stemming from her fall off a house in the past. She also described diffuse arthritic pain with primary areas involving her neck, back, shoulders, and left hip.  Experience of frequent headaches/migraines: Endorsed. She reported a history of frequent migraine headaches dating back to adolescence. She noted that she would commonly miss days in school due to migraine headaches, as well as days at work as she got older. Currently, headache symptoms continue to occur frequently. She is able to use over-the-counter medications to help better manage these symptoms.  Frequent instances of dizziness/vertigo: Denied.  Sensory  changes: She utilizes glasses and hearing aids with benefit. Other sensory changes/difficulties (e.g., taste and smell) were denied.  Balance/coordination difficulties: Endorsed. She reported some mild instability at times. She noted a prior fall around September 2023, caused by her tripped while carrying a basket of clothes down a flight of stairs. No more recent falls were reported. One side of the body was not said to exhibit greater instability or weakness.  Other motor difficulties: Denied.  Sleep History: Estimated hours obtained each night:  Unclear. She reported feeling as though she sleeps "a lot," but noted that she frequently wakes throughout the night. As such, a numerical estimation was difficult discern.  Difficulties falling asleep: Denied. Difficulties staying asleep: Endorsed. Feels rested and refreshed upon awakening: Denied. Her granddaughter-in-law noted that Ms. Hudgins will often doze off and nap during the day.   History of snoring: Denied. History of waking up gasping for air: Denied. Witnessed breath cessation while asleep: Denied.  History of vivid dreaming: Endorsed. Excessive movement while asleep: Endorsed. Instances of acting out her dreams: Endorsed. Most commonly, she appeared to describe tossing and turning while asleep. However, she did describe what seems to be some acting out dream content. She noted an example where she woke to find herself patting the dog, as well as other times where she wakes to find the covers strewn about the bed. She reported a more remote history of sleepwalking but denied any recent experiences to her knowledge.   Psychiatric/Behavioral Health History: Depression: Endorsed. She reported a history of depression, largely attributed to the passing of her husband in 2012. Medical records do suggest concerns surrounding a prior suicide attempt around that time via medication overdose. She reportedly was taken to the ED to have her stomach pumped. She described her current mood as "seems more depressed lately" and was tearful when describing this. She noted that mood-related medications were somewhat helpful. She denied prior involvement with a therapist or counselor. Current suicidal ideation, intent, or plan was denied.  Anxiety: Denied. Mania: Denied. Trauma History: Denied. Visual/auditory hallucinations: Denied. Delusional thoughts: Denied.  Tobacco: Denied. Alcohol: She denied current alcohol consumption as well as a history of problematic alcohol abuse or dependence.   Recreational drugs: Denied.  Family History: Family History  Problem Relation Age of Onset   Heart disease Mother    Heart disease Father    Prostate cancer Father    Heart attack Father    Diabetes Sister    Stomach cancer Sister 10   Irritable bowel syndrome Sister    Colon polyps Sister    Diabetes Sister    Dementia Sister    Diabetes Brother    Heart disease Brother    Heart attack Brother    Cancer Brother        type unknown   Heart attack Brother    Heart disease Maternal Grandmother    Migraines Paternal Grandfather    Cerebral palsy Son    Heart disease Maternal Uncle    Colon polyps Other        pt has 77 brothers and sisters, at least 2-3  had polyps   Rectal cancer Neg Hx    Colon cancer Neg Hx    This information was confirmed by Ms. Duplechain.  Academic/Vocational History: Highest level of educational attainment: 12 years. She graduated from high school and described herself as an average (B/C) student in academic settings. No relative weaknesses were identified. 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  History of developmental delay: Denied. History of grade repetition: Denied. Enrollment in special education courses: Denied. History of LD/ADHD: Denied.  Employment: Retired. 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. 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   Evaluation Results:   Behavioral Observations: Ms. Trease was accompanied by her granddaughter-in-law, arrived to her appointment on time, and was appropriately dressed and groomed. She appeared alert. Observed gait and station were within normal limits. Gross motor functioning appeared intact upon informal observation and no abnormal movements (e.g., tremors) were noted. Her affect was generally  relaxed and positive, but did range appropriately given the subject being discussed during the clinical interview. Spontaneous speech was fluent but mildly slowed. Word finding difficulties were not observed during the clinical interview. Thought processes were coherent, organized, and normal in content. Insight into her cognitive difficulties appeared adequate.   During testing, there were periods of time where she became tearful due to perceived poor performance. This was primarily noted during memory tasks, as well as a task assessing response inhibition (D-KEFS Color-Word). Sustained attention was otherwise appropriate. Task engagement was adequate and she persisted when challenged. Overall, Ms. Christos was cooperative with the clinical interview and subsequent testing procedures.   Adequacy of Effort: The validity of neuropsychological testing is limited by the extent to which the individual being tested may be assumed to have exerted adequate effort during testing. Ms. Cericola expressed her intention to perform to the best of her abilities and exhibited adequate task engagement and persistence. Scores across stand-alone and embedded performance validity measures were within expectation. As such, the results of the current evaluation are believed to be a valid representation of Ms. Rames's current cognitive functioning.  Test Results: Ms. Hamon was generally oriented at the time of the current evaluation. She was unable to name the current clinic.  Intellectual abilities based upon educational and vocational attainment were estimated to be in the below average range. Premorbid abilities were estimated to be within the well below average range based upon a single-word reading test.   Processing speed was variable, ranging from the well below average to average normative ranges. Basic attention was below average to average. More complex attention (e.g., working memory) was variable, ranging from the  exceptionally low to average normative ranges. Executive functioning was also variable, ranging from the exceptionally low to average normative ranges. She performed in the average range across a task assessing safety and judgment.   Assessed receptive language abilities were above average. Likewise, Ms. Haese did not exhibit any difficulties comprehending task instructions and answered all questions asked of her appropriately. Assessed expressive language (e.g., verbal fluency and confrontation naming) was largely average. Some variability was noted across semantic fluency.     Assessed visuospatial/visuoconstructional abilities were below average to above average.    Learning (i.e., encoding) of novel verbal information was exceptionally low to well below average. Spontaneous delayed recall (i.e., retrieval) of previously learned information was exceptionally low to below average. Retention rates were 60% across a story learning task, 0% across a list learning task, and 32% across a figure drawing task. Performance across recognition tasks was exceptionally low to below average, suggesting limited evidence for information consolidation.   Results of emotional screening instruments suggested that recent symptoms of generalized anxiety  were in the severe range, while symptoms of depression were within the mild range. A screening instrument assessing recent sleep quality suggested the presence of minimal sleep dysfunction.  Tables of Scores:   Note: This summary of test scores accompanies the interpretive report and should not be considered in isolation without reference to the appropriate sections in the text. Descriptors are based on appropriate normative data and may be adjusted based on clinical judgment. Terms such as "Within Normal Limits" and "Outside Normal Limits" are used when a more specific description of the test score cannot be determined.       Percentile - Normative Descriptor > 98 -  Exceptionally High 91-97 - Well Above Average 75-90 - Above Average 25-74 - Average 9-24 - Below Average 2-8 - Well Below Average < 2 - Exceptionally Low       Orientation:      Raw Score Percentile   NAB Orientation, Form 1 27/29 --- ---       Cognitive Screening:      Raw Score Percentile   SLUMS: 16/30 --- ---       RBANS, Form A: Standard Score/ Scaled Score Percentile   Total Score 68 2 Well Below Average  Immediate Memory 65 1 Exceptionally Low    List Learning 3 1 Exceptionally Low    Story Memory 5 5 Well Below Average  Visuospatial/Constructional 96 39 Average    Figure Copy 12 75 Above Average    Line Orientation 13/20 10-16 Below Average  Language 85 16 Below Average    Picture Naming 9/10 26-50 Average    Semantic Fluency 4 2 Well Below Average  Attention 81 5 Well Below Average    Digit Span 7 16 Below Average    Coding 5 5 Well Below Average  Delayed Memory 52 <1 Exceptionally Low    List Recall 0/10 <2 Exceptionally Low    List Recognition 13/20 <2 Exceptionally Low    Story Recall 4 2 Well Below Average    Story Recognition 7/12 5-7 Well Below Average    Figure Recall 6 9 Below Average    Figure Recognition 5/8 21-29 Below Average        Intellectual Functioning:      Standard Score Percentile   Test of Premorbid Functioning: 79 8 Well Below Average       Attention/Executive Function:     Trail Making Test (TMT): Raw Score (T Score) Percentile     Part A 43 secs.,  0 errors (47) 38 Average    Part B 200 secs.,  0 errors (39) 14 Below Average         Scaled Score Percentile   WAIS-IV Digit Span: 6 9 Below Average    Forward 10 50 Average    Backward 8 25 Average    Sequencing 2 <1 Exceptionally Low        Scaled Score Percentile   WAIS-IV Similarities: 6 9 Below Average       D-KEFS Color-Word Interference Test: Raw Score (Scaled Score) Percentile     Color Naming 35 secs. (9) 37 Average    Word Reading 38 secs. (4) 2 Well Below Average     Inhibition 93 secs. (7) 16 Below Average      Total Errors 10 errors (3) 1 Exceptionally Low    Inhibition/Switching 129 secs. (3) 1 Exceptionally Low      Total Errors 18 errors (1) <1 Exceptionally Low       D-KEFS  Verbal Fluency Test: Raw Score (Scaled Score) Percentile     Letter Total Correct 33 (9) 37 Average    Category Total Correct 27 (8) 25 Average    Category Switching Total Correct 10 (8) 25 Average    Category Switching Accuracy 8 (7) 16 Below Average      Total Set Loss Errors 0 (13) 84 Above Average      Total Repetition Errors 6 (7) 16 Below Average       NAB Executive Functions Module, Form 1: T Score Percentile     Judgment 55 69 Average       Language:     Verbal Fluency Test: Raw Score (T Score) Percentile     Phonemic Fluency (FAS) 33 (44) 27 Average    Animal Fluency 15 (47) 38 Average        NAB Language Module, Form 1: T Score Percentile     Auditory Comprehension 58 79 Above Average    Naming 28/31 (44) 27 Average       Visuospatial/Visuoconstruction:      Raw Score Percentile   Clock Drawing: 9/10 --- Within Normal Limits        Scaled Score Percentile   WAIS-IV Block Design: 9 37 Average       Mood and Personality:      Raw Score Percentile   Geriatric Depression Scale: 15 --- Mild  Geriatric Anxiety Scale: 30 --- Severe    Somatic 10 --- Moderate    Cognitive 9 --- Severe    Affective 11 --- Severe       Additional Questionnaires:      Raw Score Percentile   PROMIS Sleep Disturbance Questionnaire: 24 --- None to Slight   Informed Consent and Coding/Compliance:   The current evaluation represents a clinical evaluation for the purposes previously outlined by the referral source and is in no way reflective of a forensic evaluation.   Ms. Milling was provided with a verbal description of the nature and purpose of the present neuropsychological evaluation. Also reviewed were the foreseeable risks and/or discomforts and benefits of the  procedure, limits of confidentiality, and mandatory reporting requirements of this provider. The patient was given the opportunity to ask questions and receive answers about the evaluation. Oral consent to participate was provided by the patient.   This evaluation was conducted by Christia Reading, Ph.D., ABPP-CN, board certified clinical neuropsychologist. Ms. Doug Sou completed a clinical interview with Dr. Melvyn Novas, billed as one unit [phone], and 145 minutes of cognitive testing and scoring, billed as one unit [phone] and four additional units 96139. Psychometrist Cruzita Lederer, B.S., assisted Dr. Melvyn Novas with test administration and scoring procedures. As a separate and discrete service, Dr. Melvyn Novas spent a total of 160 minutes in interpretation and report writing billed as one unit [phone] and two units 96133.

## 2022-10-02 NOTE — Progress Notes (Signed)
   Psychometrician Note   Cognitive testing was administered to Charlene Patterson by Cruzita Lederer, B.S. (psychometrist) under the supervision of Dr. Christia Reading, Ph.D., licensed psychologist on 10/02/2022. [name] did not appear overtly distressed by the testing session per behavioral observation or responses across self-report questionnaires. Rest breaks were offered.    The battery of tests administered was selected by Dr. Christia Reading, Ph.D. with consideration to [name] current level of functioning, the nature of her symptoms, emotional and behavioral responses during interview, level of literacy, observed level of motivation/effort, and the nature of the referral question. This battery was communicated to the psychometrist. Communication between Dr. Christia Reading, Ph.D. and the psychometrist was ongoing throughout the evaluation and Dr. Christia Reading, Ph.D. was immediately accessible at all times. Dr. Christia Reading, Ph.D. provided supervision to the psychometrist on the date of this service to the extent necessary to assure the quality of all services provided.    Charlene Patterson will return within approximately 1-2 weeks for an interactive feedback session with Dr. Melvyn Novas at which time her test performances, clinical impressions, and treatment recommendations will be reviewed in detail. [name] understands she can contact our office should she require our assistance before this time.  A total of 145 minutes of billable time were spent face-to-face with [name] by the psychometrist. This includes both test administration and scoring time. Billing for these services is reflected in the clinical report generated by Dr. Christia Reading, Ph.D.  This note reflects time spent with the psychometrician and does not include test scores or any clinical interpretations made by Dr. Melvyn Novas. The full report will follow in a separate note.

## 2022-10-10 ENCOUNTER — Other Ambulatory Visit: Payer: Self-pay | Admitting: Internal Medicine

## 2022-10-10 ENCOUNTER — Ambulatory Visit: Payer: Medicare HMO | Admitting: Psychology

## 2022-10-10 DIAGNOSIS — F03A4 Unspecified dementia, mild, with anxiety: Secondary | ICD-10-CM | POA: Diagnosis not present

## 2022-10-10 DIAGNOSIS — K219 Gastro-esophageal reflux disease without esophagitis: Secondary | ICD-10-CM

## 2022-10-10 NOTE — Progress Notes (Signed)
   Neuropsychology Feedback Session Charlene Patterson. [facility] [place] Department of Neurology  Reason for Referral:   Charlene Patterson is a 77 y.o. right-handed Caucasian female referred by Marlowe Kays, PA-C, to characterize her current cognitive functioning and assist with diagnostic clarity and treatment planning in the context of subjective cognitive decline.   Feedback:   Charlene Patterson completed a comprehensive neuropsychological evaluation on 10/01/2021. Please refer to that encounter for the full report and recommendations. Briefly, results suggested a primary impairment surrounding all aspects of verbal memory. Additional performance variability was exhibited across processing speed, complex attention, executive functioning, and semantic fluency. The etiology of her mild dementia presentation is unclear. The presence of an underlying neurodegenerative illness, namely Alzheimer's disease, remains a possibility. Across verbal memory tasks, Charlene Patterson did not benefit from the repeated provision of information, exhibited normative impairments across delayed recall trials (including 0% retention across a list learning task), and performed poorly across yes/no recognition trials. This suggests concerns for rapid forgetting and an evolving storage impairment, both of which can be hallmark characteristics of this illness. Whereas the majority of her cognitive profile exhibited stability relative to ger 2017 evaluation, verbal memory did exhibit evidence for objective decline. It is necessary to highlight that her delayed recall of visual information was normatively appropriate and performance across all non-memory domains commonly implicated early on in this illness (namely semantic fluency, confrontation naming, and visuospatial abilities) were also largely normatively appropriate. While this could suggest the very early stages of this illness, the presence of Alzheimer's disease cannot currently be  stated with confidence. Monitoring over time to assess for negative progression will be vital moving forward. Despite concern for some REM sleep behaviors, Charlene Patterson does not exhibit other behavioral characteristics of Lewy body disease. Testing patterns are also not consistent with this illness presently.    Charlene Patterson was accompanied by her granddaughter-in-law during the current feedback session. Content of the current session focused on the results of her neuropsychological evaluation. Charlene Patterson was given the opportunity to ask questions and her questions were answered. She was encouraged to reach out should additional questions arise. A copy of her report was provided at the conclusion of the visit.      Greater than 31 minutes were spent preparing for, conducting, and documenting the current feedback session with Charlene Patterson, billed as one unit [phone].

## 2022-10-18 ENCOUNTER — Other Ambulatory Visit: Payer: Self-pay | Admitting: Internal Medicine

## 2022-10-18 DIAGNOSIS — K219 Gastro-esophageal reflux disease without esophagitis: Secondary | ICD-10-CM

## 2022-10-26 ENCOUNTER — Telehealth: Payer: Self-pay | Admitting: Internal Medicine

## 2022-10-26 NOTE — Telephone Encounter (Signed)
Contacted [name] to schedule their annual wellness visit. Appointment made for 12/20/2022.  [facility] Care Guide [facility] AWV TEAM Direct Dial: [phone]

## 2022-11-29 ENCOUNTER — Other Ambulatory Visit: Payer: Self-pay | Admitting: Internal Medicine

## 2022-11-29 DIAGNOSIS — F450 Somatization disorder: Secondary | ICD-10-CM

## 2022-11-29 DIAGNOSIS — F339 Major depressive disorder, recurrent, unspecified: Secondary | ICD-10-CM

## 2022-11-29 DIAGNOSIS — M159 Polyosteoarthritis, unspecified: Secondary | ICD-10-CM

## 2022-11-29 MED ORDER — MIRTAZAPINE 7.5 MG PO TABS: 7.5000 mg | ORAL_TABLET | Freq: Every day | ORAL | 0 refills | Status: DC

## 2022-12-06 ENCOUNTER — Other Ambulatory Visit: Payer: Self-pay | Admitting: Internal Medicine

## 2022-12-06 DIAGNOSIS — M159 Polyosteoarthritis, unspecified: Secondary | ICD-10-CM

## 2022-12-06 DIAGNOSIS — F32A Depression, unspecified: Secondary | ICD-10-CM

## 2022-12-12 ENCOUNTER — Telehealth: Payer: Self-pay | Admitting: Internal Medicine

## 2022-12-12 NOTE — Telephone Encounter (Signed)
Patient's grand daughter would like to speak to you about Dr. Yetta Barre.  Name:  Charlene Patterson   Phone:  [phone]

## 2022-12-13 ENCOUNTER — Other Ambulatory Visit: Payer: Self-pay | Admitting: Internal Medicine

## 2022-12-13 ENCOUNTER — Telehealth: Payer: Self-pay | Admitting: Internal Medicine

## 2022-12-13 DIAGNOSIS — G8929 Other chronic pain: Secondary | ICD-10-CM

## 2022-12-13 DIAGNOSIS — F418 Other specified anxiety disorders: Secondary | ICD-10-CM

## 2022-12-13 MED ORDER — DULOXETINE HCL 30 MG PO CPEP
30.0000 mg | ORAL_CAPSULE | Freq: Every day | ORAL | 0 refills | Status: DC
Start: 2022-12-13 — End: 2023-05-18

## 2022-12-13 NOTE — Telephone Encounter (Signed)
Patient's granddaughter Luster Landsberg - on [place]) called extremely upset that refill for DULoxetine (CYMBALTA) 30 MG capsule  was denied for refill and that it is not in her current medication list. States that they were told in January to come back in six months, so she is not behind on appointments. AVS does indicate that six month follow up was recommended at appointment on July 13, 2022. States that patient took her last dosage of this medication last night and should be weaned off if provider does not want patient taking medication at this time. Granddaugher requests medication be sent today to [facility] - [place], [place] - [address] and that she receive a call back to inform her that medication has been sent. Call back number is [phone].

## 2022-12-13 NOTE — Telephone Encounter (Signed)
Updated DPR found.  Granddaughter Luster Landsberg) is upset regarding the denial of a refill for patient (see separate encounter).  She is also concerned regarding a lack of communication from last September when a letter was requested to be written for the patient regarding her ability to make decisions regarding her healthcare. Also states that when she accompanied patient to her most recent visit in January, provider did not address patient at all, only addressed the granddaughter. States patient has expressed that she does not feel that the provider listens to her concerns and they are considering a change in care.   Message has been sent to provider regarding prescription refill and grandaughter was advised that if they would be more comfortable with a different provider, we will be able to make that change for her.

## 2022-12-26 ENCOUNTER — Other Ambulatory Visit: Payer: Self-pay | Admitting: Internal Medicine

## 2022-12-26 ENCOUNTER — Telehealth: Payer: Self-pay | Admitting: Internal Medicine

## 2022-12-26 DIAGNOSIS — E785 Hyperlipidemia, unspecified: Secondary | ICD-10-CM

## 2022-12-26 DIAGNOSIS — K219 Gastro-esophageal reflux disease without esophagitis: Secondary | ICD-10-CM

## 2022-12-26 MED ORDER — ATORVASTATIN CALCIUM 40 MG PO TABS: 40.0000 mg | ORAL_TABLET | Freq: Every day | ORAL | 0 refills | Status: DC

## 2022-12-26 MED ORDER — FAMOTIDINE 20 MG PO TABS: 20.0000 mg | ORAL_TABLET | Freq: Every day | ORAL | 0 refills | Status: DC

## 2022-12-26 NOTE — Telephone Encounter (Signed)
Prescription Request  12/26/2022  LOV: 07/13/2022  What is the name of the medication or equipment? [email or web address]  Have you contacted your pharmacy to request a refill? Yes   Which pharmacy would you like this sent to?  [facility] - [place], [place] - [address] [place] [address] Phone: [phone] Fax: [phone]    Patient notified that their request is being sent to the clinical staff for review and that they should receive a response within 2 business days.   Please advise at Mobile [phone] (mobile)

## 2023-01-17 ENCOUNTER — Other Ambulatory Visit: Payer: Self-pay | Admitting: Internal Medicine

## 2023-01-17 DIAGNOSIS — K219 Gastro-esophageal reflux disease without esophagitis: Secondary | ICD-10-CM

## 2023-01-24 ENCOUNTER — Ambulatory Visit: Payer: Medicare HMO | Admitting: Physician Assistant

## 2023-02-08 ENCOUNTER — Ambulatory Visit: Payer: Medicare HMO | Admitting: Physician Assistant

## 2023-03-01 ENCOUNTER — Telehealth: Payer: Self-pay | Admitting: Internal Medicine

## 2023-03-01 DIAGNOSIS — E781 Pure hyperglyceridemia: Secondary | ICD-10-CM

## 2023-03-01 NOTE — Telephone Encounter (Signed)
Prescription Request  03/01/2023  LOV: 07/13/2022  What is the name of the medication or equipment? vascepa  Have you contacted your pharmacy to request a refill? Yes   Which pharmacy would you like this sent to?  [facility] - [place], [place] - [address] [place] [address] Phone: [phone] Fax: [phone]    Patient notified that their request is being sent to the clinical staff for review and that they should receive a response within 2 business days.   Please advise at Mobile [phone] (mobile)

## 2023-03-06 ENCOUNTER — Other Ambulatory Visit: Payer: Self-pay | Admitting: Internal Medicine

## 2023-03-06 DIAGNOSIS — F339 Major depressive disorder, recurrent, unspecified: Secondary | ICD-10-CM

## 2023-03-06 DIAGNOSIS — F450 Somatization disorder: Secondary | ICD-10-CM

## 2023-04-04 ENCOUNTER — Ambulatory Visit: Payer: Medicare HMO | Admitting: Internal Medicine

## 2023-04-04 ENCOUNTER — Telehealth: Payer: Self-pay | Admitting: Internal Medicine

## 2023-04-04 NOTE — Telephone Encounter (Signed)
Please scheduled when possible.  Thanks.

## 2023-04-04 NOTE — Telephone Encounter (Signed)
Yes, that is ok with me 

## 2023-04-04 NOTE — Telephone Encounter (Signed)
Dr. Para March has agreed to accept Miss Charlene Patterson as a new patient. Needing approval from Dr. Yetta Barre to schedule TOC with Dr. Para March. Okay to schedule?

## 2023-04-06 NOTE — Telephone Encounter (Signed)
Scheduled, thanks to all

## 2023-04-09 ENCOUNTER — Other Ambulatory Visit: Payer: Self-pay | Admitting: Internal Medicine

## 2023-04-09 DIAGNOSIS — K219 Gastro-esophageal reflux disease without esophagitis: Secondary | ICD-10-CM

## 2023-04-26 ENCOUNTER — Other Ambulatory Visit: Payer: Self-pay | Admitting: Internal Medicine

## 2023-04-26 DIAGNOSIS — E785 Hyperlipidemia, unspecified: Secondary | ICD-10-CM

## 2023-04-30 ENCOUNTER — Other Ambulatory Visit: Payer: Self-pay | Admitting: Internal Medicine

## 2023-04-30 DIAGNOSIS — E785 Hyperlipidemia, unspecified: Secondary | ICD-10-CM

## 2023-05-17 ENCOUNTER — Ambulatory Visit (HOSPITAL_COMMUNITY)
Admission: RE | Admit: 2023-05-17 | Discharge: 2023-05-17 | Disposition: A | Discharge status: Home / Self Care | Payer: Medicare HMO | Source: Ambulatory Visit | Attending: Cardiovascular Disease | Admitting: Cardiovascular Disease

## 2023-05-17 DIAGNOSIS — I779 Disorder of arteries and arterioles, unspecified: Secondary | ICD-10-CM | POA: Diagnosis not present

## 2023-05-17 DIAGNOSIS — I6522 Occlusion and stenosis of left carotid artery: Secondary | ICD-10-CM | POA: Diagnosis not present

## 2023-05-18 ENCOUNTER — Encounter: Payer: Self-pay | Admitting: Family Medicine

## 2023-05-18 ENCOUNTER — Ambulatory Visit (INDEPENDENT_AMBULATORY_CARE_PROVIDER_SITE_OTHER): Payer: Medicare HMO | Admitting: Family Medicine

## 2023-05-18 VITALS — BP 116/72 | HR 77 | Temp 98.5°F | Ht 62.0 in | Wt 137.2 lb

## 2023-05-18 DIAGNOSIS — M48061 Spinal stenosis, lumbar region without neurogenic claudication: Secondary | ICD-10-CM | POA: Diagnosis not present

## 2023-05-18 DIAGNOSIS — R55 Syncope and collapse: Secondary | ICD-10-CM

## 2023-05-18 DIAGNOSIS — F339 Major depressive disorder, recurrent, unspecified: Secondary | ICD-10-CM

## 2023-05-18 DIAGNOSIS — M810 Age-related osteoporosis without current pathological fracture: Secondary | ICD-10-CM | POA: Diagnosis not present

## 2023-05-18 DIAGNOSIS — K5904 Chronic idiopathic constipation: Secondary | ICD-10-CM

## 2023-05-18 DIAGNOSIS — E781 Pure hyperglyceridemia: Secondary | ICD-10-CM

## 2023-05-18 DIAGNOSIS — I6529 Occlusion and stenosis of unspecified carotid artery: Secondary | ICD-10-CM

## 2023-05-18 DIAGNOSIS — R413 Other amnesia: Secondary | ICD-10-CM

## 2023-05-18 DIAGNOSIS — Z7189 Other specified counseling: Secondary | ICD-10-CM

## 2023-05-18 DIAGNOSIS — E785 Hyperlipidemia, unspecified: Secondary | ICD-10-CM

## 2023-05-18 DIAGNOSIS — F03A4 Unspecified dementia, mild, with anxiety: Secondary | ICD-10-CM

## 2023-05-18 DIAGNOSIS — D329 Benign neoplasm of meninges, unspecified: Secondary | ICD-10-CM | POA: Diagnosis not present

## 2023-05-18 LAB — COMPREHENSIVE METABOLIC PANEL
ALT: 18 U/L (ref 0–35)
AST: 21 U/L (ref 0–37)
Albumin: 4.2 g/dL (ref 3.5–5.2)
Alkaline Phosphatase: 125 U/L — ABNORMAL HIGH (ref 39–117)
BUN: 15 mg/dL (ref 6–23)
CO2: 28 meq/L (ref 19–32)
Calcium: 9.8 mg/dL (ref 8.4–10.5)
Chloride: 106 meq/L (ref 96–112)
Creatinine, Ser: 1.09 mg/dL (ref 0.40–1.20)
GFR: 49.3 mL/min — ABNORMAL LOW (ref >60.00)
Glucose, Bld: 83 mg/dL (ref 70–99)
Potassium: 4.3 meq/L (ref 3.5–5.1)
Sodium: 140 meq/L (ref 135–145)
Total Bilirubin: 0.4 mg/dL (ref 0.2–1.2)
Total Protein: 6.6 g/dL (ref 6.0–8.3)

## 2023-05-18 LAB — LIPID PANEL
Cholesterol: 336 mg/dL — ABNORMAL HIGH (ref 0–200)
HDL: 36.9 mg/dL — ABNORMAL LOW (ref >39.00)
Total CHOL/HDL Ratio: 9
Triglycerides: 919 mg/dL — ABNORMAL HIGH (ref 0.0–149.0)

## 2023-05-18 LAB — VITAMIN B12: Vitamin B-12: >1537 pg/mL — ABNORMAL HIGH (ref 211–911)

## 2023-05-18 LAB — CBC WITH DIFFERENTIAL/PLATELET
Basophils Absolute: 0 10*3/uL (ref 0.0–0.1)
Basophils Relative: 0.7 % (ref 0.0–3.0)
Eosinophils Absolute: 0.1 10*3/uL (ref 0.0–0.7)
Eosinophils Relative: 1.8 % (ref 0.0–5.0)
HCT: 37.5 % (ref 36.0–46.0)
Hemoglobin: 12.5 g/dL (ref 12.0–15.0)
Lymphocytes Relative: 40.7 % (ref 12.0–46.0)
Lymphs Abs: 1.7 10*3/uL (ref 0.7–4.0)
MCHC: 33.3 g/dL (ref 30.0–36.0)
MCV: 89.9 fL (ref 78.0–100.0)
Monocytes Absolute: 0.3 10*3/uL (ref 0.1–1.0)
Monocytes Relative: 6.9 % (ref 3.0–12.0)
Neutro Abs: 2 10*3/uL (ref 1.4–7.7)
Neutrophils Relative %: 49.9 % (ref 43.0–77.0)
Platelets: 295 10*3/uL (ref 150.0–400.0)
RBC: 4.18 Mil/uL (ref 3.87–5.11)
RDW: 16.1 % — ABNORMAL HIGH (ref 11.5–15.5)
WBC: 4.1 10*3/uL (ref 4.0–10.5)

## 2023-05-18 LAB — VITAMIN D 25 HYDROXY (VIT D DEFICIENCY, FRACTURES): VITD: 29.15 ng/mL — ABNORMAL LOW (ref 30.00–100.00)

## 2023-05-18 LAB — LDL CHOLESTEROL, DIRECT: Direct LDL: 66 mg/dL

## 2023-05-18 LAB — TSH: TSH: 4.66 u[IU]/mL (ref 0.35–5.50)

## 2023-05-18 MED ORDER — LUBIPROSTONE 24 MCG PO CAPS: 24.0000 ug | ORAL_CAPSULE | Freq: Two times a day (BID) | ORAL | Status: DC | PRN

## 2023-05-18 MED ORDER — ICOSAPENT ETHYL 1 G PO CAPS: 1.0000 g | ORAL_CAPSULE | Freq: Two times a day (BID) | ORAL | Status: DC

## 2023-05-18 MED ORDER — ATORVASTATIN CALCIUM 40 MG PO TABS: 40.0000 mg | ORAL_TABLET | Freq: Every day | ORAL | 3 refills | Status: DC

## 2023-05-18 NOTE — Patient Instructions (Addendum)
Go to the lab on the way out.   If you have mychart we'll likely use that to update you.    ?Take care.  Glad to see you. ?Don't change your meds for now.  ?

## 2023-05-18 NOTE — Progress Notes (Unsigned)
Had been off cymbalta and vascepa for months.  Patient and family didn't notice a clear change in mood off cymbalta and we agreed not to restart if now but to monitor her mood.    She is tolerating back pain with tylenol.  More pain with more activity.    Off amitiza now, has used it prn.    Taking namenda BID.  Seeing neuro.    Elevated Cholesterol: Using medications without problems: yes Muscle aches: no Diet compliance: d/w pt.  Exercise: d/w pt.    Charlene Patterson designated if patient were incapacitated.    H/o syncope.  Had cards eval.  Treated with beta blocker.   Carotid eval d/w pt.  Right ICA - mild disease Left ICA - moderate disease Plan per cards to repeat in 1 year  H/o osteoporosis on DXA 2023.    She had zostavax prev.  Years ago.   Meds, vitals, and allergies reviewed.   ROS: Per HPI unless specifically indicated in ROS section   GEN: nad, alert and oriented HEENT: mucous membranes moist NECK: supple w/o LA CV: rrr PULM: ctab, no inc wob ABD: soft, +bs EXT: no edema SKIN: no acute rash

## 2023-05-20 ENCOUNTER — Encounter: Payer: Self-pay | Admitting: Family Medicine

## 2023-05-20 ENCOUNTER — Other Ambulatory Visit: Payer: Self-pay | Admitting: Family Medicine

## 2023-05-20 DIAGNOSIS — E559 Vitamin D deficiency, unspecified: Secondary | ICD-10-CM

## 2023-05-20 DIAGNOSIS — E781 Pure hyperglyceridemia: Secondary | ICD-10-CM

## 2023-05-20 DIAGNOSIS — Z7189 Other specified counseling: Secondary | ICD-10-CM | POA: Insufficient documentation

## 2023-05-20 DIAGNOSIS — I6529 Occlusion and stenosis of unspecified carotid artery: Secondary | ICD-10-CM | POA: Insufficient documentation

## 2023-05-20 MED ORDER — ICOSAPENT ETHYL 1 G PO CAPS: 1.0000 g | ORAL_CAPSULE | Freq: Two times a day (BID) | ORAL | 3 refills | Status: DC

## 2023-05-20 MED ORDER — VITAMIN D3 SUPER STRENGTH 50 MCG (2000 UT) PO CAPS: 4000.0000 [IU] | ORAL_CAPSULE | Freq: Every day | ORAL | Status: AC

## 2023-05-20 NOTE — Assessment & Plan Note (Signed)
Noted on previous imaging, discussed with patient, incidental.

## 2023-05-20 NOTE — Assessment & Plan Note (Signed)
Continue Tylenol and update me as needed.

## 2023-05-20 NOTE — Assessment & Plan Note (Signed)
Continue atorvastatin.  Off of sleep in the meantime.  See notes on labs.

## 2023-05-20 NOTE — Assessment & Plan Note (Signed)
Patient and family didn't notice a clear change in mood off cymbalta and we agreed not to restart if now but to monitor her mood.

## 2023-05-20 NOTE — Assessment & Plan Note (Signed)
Continue beta-blocker per cardiology.  No recent episodes.  Has tolerated Toprol.

## 2023-05-20 NOTE — Assessment & Plan Note (Signed)
Recheck vitamin D level pending.

## 2023-05-20 NOTE — Assessment & Plan Note (Signed)
Discussed recent carotid scan  Right ICA - mild disease Left ICA - moderate disease Plan per cards to repeat in 1 year

## 2023-05-20 NOTE — Assessment & Plan Note (Signed)
Has family support.  Continue Namenda.  Per neurology.

## 2023-05-20 NOTE — Assessment & Plan Note (Signed)
Renee Cisnero designated if patient were incapacitated.

## 2023-05-20 NOTE — Assessment & Plan Note (Signed)
Continue Amitiza as needed.

## 2023-05-21 ENCOUNTER — Encounter: Payer: Self-pay | Admitting: Family Medicine

## 2023-05-22 ENCOUNTER — Telehealth: Payer: Self-pay

## 2023-05-22 DIAGNOSIS — I6523 Occlusion and stenosis of bilateral carotid arteries: Secondary | ICD-10-CM

## 2023-05-22 NOTE — Telephone Encounter (Signed)
Called and spoke with granddaughter Luster Landsberg on [place] who verbalized understanding. Repeat order placed for next year.

## 2023-05-22 NOTE — Telephone Encounter (Signed)
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

## 2023-06-04 ENCOUNTER — Other Ambulatory Visit: Payer: Self-pay | Admitting: Cardiovascular Disease

## 2023-06-07 ENCOUNTER — Other Ambulatory Visit: Payer: Self-pay | Admitting: Physician Assistant

## 2023-06-07 ENCOUNTER — Other Ambulatory Visit: Payer: Self-pay | Admitting: Internal Medicine

## 2023-06-07 DIAGNOSIS — F32A Depression, unspecified: Secondary | ICD-10-CM

## 2023-06-07 DIAGNOSIS — F339 Major depressive disorder, recurrent, unspecified: Secondary | ICD-10-CM

## 2023-06-08 MED ORDER — MIRTAZAPINE 7.5 MG PO TABS: 7.5000 mg | ORAL_TABLET | Freq: Every day | ORAL | 1 refills | Status: DC

## 2023-06-08 NOTE — Telephone Encounter (Signed)
Sent. Thanks.   

## 2023-07-05 ENCOUNTER — Other Ambulatory Visit: Payer: Self-pay | Admitting: Physician Assistant

## 2023-07-12 DIAGNOSIS — C44212 Basal cell carcinoma of skin of right ear and external auricular canal: Secondary | ICD-10-CM | POA: Diagnosis not present

## 2023-07-12 DIAGNOSIS — C4441 Basal cell carcinoma of skin of scalp and neck: Secondary | ICD-10-CM | POA: Diagnosis not present

## 2023-07-12 DIAGNOSIS — D485 Neoplasm of uncertain behavior of skin: Secondary | ICD-10-CM | POA: Diagnosis not present

## 2023-07-17 ENCOUNTER — Telehealth: Payer: Self-pay | Admitting: Family Medicine

## 2023-07-17 ENCOUNTER — Ambulatory Visit (INDEPENDENT_AMBULATORY_CARE_PROVIDER_SITE_OTHER): Payer: Medicare HMO

## 2023-07-17 VITALS — Ht 62.0 in | Wt 137.0 lb

## 2023-07-17 DIAGNOSIS — Z0000 Encounter for general adult medical examination without abnormal findings: Secondary | ICD-10-CM | POA: Diagnosis not present

## 2023-07-17 NOTE — Patient Instructions (Signed)
 Charlene Patterson , Thank you for taking time to come for your Medicare Wellness Visit. I appreciate your ongoing commitment to your health goals. Please review the following plan we discussed and let me know if I can assist you in the future.   Referrals/Orders/Follow-Ups/Clinician Recommendations: none  This is a list of the screening recommended for you and due dates:  Health Maintenance  Topic Date Due   COVID-19 Vaccine (1 - 2024-25 season) 08/02/2023*   Flu Shot  10/01/2023*   DTaP/Tdap/Td vaccine (2 - Td or Tdap) 07/16/2024*   Medicare Annual Wellness Visit  07/16/2024   Pneumonia Vaccine  Completed   DEXA scan (bone density measurement)  Completed   Hepatitis C Screening  Completed   Zoster (Shingles) Vaccine  Completed   HPV Vaccine  Aged Out   Colon Cancer Screening  Discontinued  *Topic was postponed. The date shown is not the original due date.    Advanced directives: (Declined) Advance directive discussed with you today. Even though you declined this today, please call our office should you change your mind, and we can give you the proper paperwork for you to fill out.  Next Medicare Annual Wellness Visit scheduled for next year: Yes 07/17/24 @ 2:20pm

## 2023-07-17 NOTE — Progress Notes (Signed)
 Subjective:   Charlene Patterson is a 78 y.o. female who presents for Medicare Annual (Subsequent) preventive examination.  Visit Complete: Virtual I connected with  Rock LITTIE Fish on 07/17/23 by a audio enabled telemedicine application and verified that I am speaking with the correct person using two identifiers.  Patient Location: Home  Provider Location: Office/Clinic  I discussed the limitations of evaluation and management by telemedicine. The patient expressed understanding and agreed to proceed.  Vital Signs: Because this visit was a virtual/telehealth visit, some criteria may be missing or patient reported. Any vitals not documented were not able to be obtained and vitals that have been documented are patient reported.  Patient Medicare AWV questionnaire was completed by the patient on 07/16/23; I have confirmed that all information answered by patient is correct and no changes since this date.  Cardiac Risk Factors include: advanced age (>21men, >56 women);dyslipidemia;sedentary lifestyle    Objective:    Today's Vitals   07/17/23 1505  Weight: 137 lb (62.1 kg)  Height: 5' 2 (1.575 m)   Body mass index is 25.06 kg/m.     07/17/2023    3:20 PM 07/26/2022    3:23 PM 07/26/2022    3:14 PM 04/25/2022    2:45 PM 03/29/2022   11:37 AM 03/22/2022    1:45 PM 01/23/2022    9:54 AM  Advanced Directives  Does Patient Have a Medical Advance Directive? No Yes Yes No No No No  Would patient like information on creating a medical advance directive?      No - Patient declined     Current Medications (verified) Outpatient Encounter Medications as of 07/17/2023  Medication Sig   acetaminophen  (TYLENOL ) 650 MG CR tablet Take 1,300 mg by mouth in the morning and at bedtime.   atorvastatin  (LIPITOR) 40 MG tablet Take 1 tablet (40 mg total) by mouth daily.   cyanocobalamin  1000 MCG tablet Take 1,000 mcg by mouth daily.   EXCEDRIN TENSION HEADACHE 500-65 MG TABS per tablet Take 1 tablet by  mouth every 6 (six) hours as needed (for headaches).   famotidine  (PEPCID ) 20 MG tablet Take 1 tablet (20 mg total) by mouth daily. TAKE 1 TABLET(20 MG) BY MOUTH AT BEDTIME   icosapent  Ethyl (VASCEPA ) 1 g capsule Take 1 capsule (1 g total) by mouth 2 (two) times daily.   lubiprostone  (AMITIZA ) 24 MCG capsule Take 1 capsule (24 mcg total) by mouth 2 (two) times daily as needed for constipation.   memantine  (NAMENDA ) 5 MG tablet TAKE 1 TABLET BY MOUTH TWICE A DAY   metoprolol  succinate (TOPROL -XL) 25 MG 24 hr tablet TAKE 1 TABLET BY MOUTH DAILY TO SUPPRESS INCREASED HEART RATES   mirtazapine  (REMERON ) 7.5 MG tablet Take 1 tablet (7.5 mg total) by mouth at bedtime.   NON FORMULARY Take 1 tablet by mouth See admin instructions. Head Care Protective Health tablets- Take 1 tablet by mouth once a day as needed for neuro health   pantoprazole  (PROTONIX ) 40 MG tablet TAKE 1 TABLET (40 MG TOTAL) BY MOUTH DAILY.   Potassium 99 MG TABS Take 99 mg by mouth at bedtime.   VITAMIN D3 SUPER STRENGTH 50 MCG (2000 UT) CAPS Take 2 capsules (4,000 Units total) by mouth at bedtime.   Zinc 50 MG TABS Take 25 mg by mouth at bedtime.   No facility-administered encounter medications on file as of 07/17/2023.    Allergies (verified) Patient has no known allergies.   History: Past Medical History:  Diagnosis Date   Allergic rhinitis 05/25/2014   Arthritis    left foot (05/26/2014)   Chronic back pain    Chronic idiopathic constipation 07/13/2022   Chronic neck pain    Chronic renal disease, stage 3, moderately decreased glomerular filtration rate (GFR) between 30-59 mL/min/1.73 square meter (HCC) 02/05/2019   Estimated Creatinine Clearance: 43.9 mL/min (by C-G formula based on SCr of 0.97 mg/dL).      Estimated Creatinine Clearance: 34.9 mL/min (by C-G formula based on SCr of 1.1 mg/dL).     Estimated Creatinine Clearance: 37.9 mL/min (by C-G formula based on SCr of 1.1 mg/dL).    Colitis 05/25/2014   Estrogen  deficiency 07/28/2021   Frequent headaches 08/08/2018   Frequent PVCs 10/29/2014   Nuclear stress EF: 55%.  There was no ST segment deviation noted during stress.  The study is normal.  This is a low risk study.  The left ventricular ejection fraction is normal (55-65%).   GERD (gastroesophageal reflux disease)    History of chicken pox    History of colon polyps    History of hiatal hernia    Hyperlipidemia with target LDL less than 130 10/29/2014   The 10-year ASCVD risk score (Arnett DK, et al., 2019) is: 21.8%    Values used to calculate the score:      Age: 31 years      Sex: Female      Is Non-Hispanic African American: No      Diabetic: Yes      Tobacco smoker: No      Systolic Blood Pressure: 110 mmHg      Is BP treated: No      HDL Cholesterol: 42.9 mg/dL      Total Cholesterol: 162 mg/dL   Hypertriglyceridemia 05/11/2015   IBS (irritable bowel syndrome)    Iron deficiency anemia due to chronic blood loss 07/30/2017   Estimated Creatinine Clearance: 39.9 mL/min (by C-G formula based on SCr of 1.1 mg/dL).   Lower GI bleeding    Major depressive disorder 12/13/2012   Meningioma (HCC) 04/27/2022   Migraines    monthly usually; weekly lately (05/26/2014)   Mild dementia, unclear etiology 10/02/2022   Osteoporosis 12/13/2012   Other dietary vitamin B12 deficiency anemia 02/05/2019   Prolonged QT interval 04/24/2022   Sciatica    Situational anxiety    Spinal stenosis in cervical region 08/14/2019   Spinal stenosis of lumbar region at multiple levels 05/25/2014   SVT (supraventricular tachycardia) (HCC) 08/09/2022   Syncope and collapse 04/24/2022   Thoracic compression fracture (HCC)    Vitamin D  deficiency 09/24/2014   Past Surgical History:  Procedure Laterality Date   ABDOMINAL HYSTERECTOMY  ~ 1978   partial   APPENDECTOMY  ~ 1978   CERVICAL CONE BIOPSY     COLONOSCOPY  07/04/2003   last 2005   POLYPECTOMY     UPPER GASTROINTESTINAL ENDOSCOPY     Family History   Problem Relation Age of Onset   Heart disease Mother    Heart disease Father    Prostate cancer Father    Heart attack Father    Diabetes Sister    Stomach cancer Sister 65   Irritable bowel syndrome Sister    Colon polyps Sister    Diabetes Sister    Dementia Sister    Diabetes Brother    Heart disease Brother    Heart attack Brother    Cancer Brother        type  unknown   Heart attack Brother    Heart disease Maternal Grandmother    Migraines Paternal Grandfather    Breast cancer Daughter    Cerebral palsy Son    Heart disease Maternal Uncle    Colon polyps Other        pt has 12 brothers and sisters, at least 2-3  had polyps   Rectal cancer Neg Hx    Colon cancer Neg Hx    Social History   Socioeconomic History   Marital status: Widowed    Spouse name: Not on file   Number of children: 2   Years of education: 12   Highest education level: High school graduate  Occupational History   Occupation: Retired  Tobacco Use   Smoking status: Never   Smokeless tobacco: Never  Vaping Use   Vaping status: Never Used  Substance and Sexual Activity   Alcohol use: No   Drug use: No   Sexual activity: Never  Other Topics Concern   Not on file  Social History Narrative   Grew up near [place], [place]    Retired   Son lives with patient, he has cerebral palsy.   Social Drivers of Corporate Investment Banker Strain: Low Risk  (07/17/2023)   Overall Financial Resource Strain (CARDIA)    Difficulty of Paying Living Expenses: Not hard at all  Food Insecurity: No Food Insecurity (07/17/2023)   Hunger Vital Sign    Worried About Running Out of Food in the Last Year: Never true    Ran Out of Food in the Last Year: Never true  Transportation Needs: No Transportation Needs (07/17/2023)   PRAPARE - Administrator, Civil Service (Medical): No    Lack of Transportation (Non-Medical): No  Physical Activity: Inactive (07/17/2023)   Exercise Vital Sign     Days of Exercise per Week: 0 days    Minutes of Exercise per Session: 0 min  Stress: Stress Concern Present (07/17/2023)   [employer] of Occupational Health - Occupational Stress Questionnaire    Feeling of Stress : To some extent  Social Connections: Moderately Integrated (07/17/2023)   Social Connection and Isolation Panel [NHANES]    Frequency of Communication with Friends and Family: Twice a week    Frequency of Social Gatherings with Friends and Family: Twice a week    Attends Religious Services: More than 4 times per year    Active Member of [employer] or Organizations: Yes    Attends Banker Meetings: 1 to 4 times per year    Marital Status: Widowed    Tobacco Counseling Counseling given: Not Answered   Clinical Intake:  Pre-visit preparation completed: No  Pain : No/denies pain    BMI - recorded: 25.06 Nutritional Status: BMI 25 -29 Overweight Nutritional Risks: None Diabetes: No  How often do you need to have someone help you when you read instructions, pamphlets, or other written materials from your doctor or pharmacy?: 1 - Never  Interpreter Needed?: No  Comments: grandson and wife lives with pt Information entered by :: B.Jahlia Omura,LPN   Activities of Daily Living    07/16/2023    9:31 PM  In your present state of health, do you have any difficulty performing the following activities:  Hearing? 1  Vision? 0  Difficulty concentrating or making decisions? 1  Walking or climbing stairs? 0  Dressing or bathing? 0  Doing errands, shopping? 1  Preparing Food and eating ? N  Using the  Toilet? N  In the past six months, have you accidently leaked urine? Y  Do you have problems with loss of bowel control? N  Managing your Medications? Y  Managing your Finances? Y  Housekeeping or managing your Housekeeping? N    Patient Care Team: Cleatus Arlyss RAMAN, MD as PCP - General (Family Medicine) Nahser, Aleene PARAS, MD as PCP - Cardiology  (Cardiology) Dermatology, [place]  Indicate any recent Medical Services you may have received from other than [facility] providers in the past year (date may be approximate).     Assessment:   This is a routine wellness examination for [place].  Hearing/Vision screen Hearing Screening - Comments:: Pt says her hearing is ok with hearing aids Vision Screening - Comments:: Pt says her vision is alright when wearing glasses Dr Octavia   Goals Addressed             This Visit's Progress    Client understands the importance of follow-up with providers by attending scheduled visits   On track    Pharmacy Care Plan   On track    CARE PLAN ENTRY  Current Barriers:  Chronic Disease Management support, education, and care coordination needs related to Hyperlipidemia and Osteoarthritis   Hyperlipidemia Lab Results  Component Value Date/Time   LDLDIRECT 99.0 08/14/2019 01:38 PM  Pharmacist Clinical Goal(s): Over the next 180 days, patient will work with PharmD and providers to maintain LDL goal < 100 and TRIG < 849 Current regimen:  atorvastatin  40 mg daily,  Vascepa  1 gram - 2 capsules twice a day Interventions: Obtained copay assistance for Vascepa  through [employer] [facility] Discussed benefits of Vascepa  including lowering triglyceridies and cardiovascular risk reduction Patient self care activities - Over the next 180 days, patient will: Continue medications as prescribed Contact pharmacist with copay issues  Osteoarthritis Pharmacist Clinical Goal(s) Over the next 180 days, patient will work with PharmD and providers to optimize arthritis medications Current regimen:  Tylenol  Arthritis 650 mg - up to 2 tablets twice a day Interventions: Discussed maximum dose of Tylenol  is 3000 mg per day Recommended Voltaren gel OTC to apply to joints Patient self care activities - Over the next 180 days, patient will: Continue medications as above Try Voltaren gel for arthritis  pain  Migraine Pharmacist Clinical Goal(s) Over the next 180 days, patient will work with PharmD and providers to optimize therapy Current regimen:  Excedrin extra strength Excedrin migraine Alka seltzer + sinus Interventions: Discussed prescription options for migraine relief, including triptans and preventative medications. Patient would prefer a medication she can take as needed when migraines occur, so recommended patient see PCP to discuss a triptan Patient self care activities - Over the next 180 days, patient will: Schedule visit with PCP to discuss migraine relief  Medication management Pharmacist Clinical Goal(s): Over the next 180 days, patient will work with PharmD and providers to maintain optimal medication adherence Current pharmacy: [facility] mail order Interventions Comprehensive medication review performed. Continue current medication management strategy Patient self care activities - Over the next 180 days, patient will: Focus on medication adherence by patient report Take medications as prescribed Report any questions or concerns to PharmD and/or provider(s)  Please see past updates related to this goal by clicking on the Past Updates button in the selected goal        Depression Screen    07/17/2023    3:15 PM 05/18/2023   12:43 PM 10/10/2021    1:18 PM 10/10/2021    1:13 PM  12/20/2020    1:33 PM 05/20/2020    3:15 PM 10/13/2019    1:45 PM  PHQ 2/9 Scores  PHQ - 2 Score 5 5 0 0 0 1 0  PHQ- 9 Score 18 20   0 1     Fall Risk    07/16/2023    9:31 PM 05/18/2023   12:43 PM 07/26/2022    3:24 PM 07/26/2022    3:14 PM 04/25/2022    2:32 PM  Fall Risk   Falls in the past year? 0 1 0 0 0  Number falls in past yr: 0 0 0 0 0  Injury with Fall? 0 0 0 0 0  Risk for fall due to :  History of fall(s)     Follow up  Falls evaluation completed Falls evaluation completed Falls evaluation completed Falls evaluation completed    MEDICARE RISK AT HOME: Medicare  Risk at Home Any stairs in or around the home?: (Patient-Rptd) Yes If so, are there any without handrails?: (Patient-Rptd) No Home free of loose throw rugs in walkways, pet beds, electrical cords, etc?: (Patient-Rptd) No Adequate lighting in your home to reduce risk of falls?: (Patient-Rptd) Yes Life alert?: (Patient-Rptd) No Use of a cane, walker or w/c?: (Patient-Rptd) No Grab bars in the bathroom?: (Patient-Rptd) No Shower chair or bench in shower?: (Patient-Rptd) No Elevated toilet seat or a handicapped toilet?: (Patient-Rptd) No  TIMED UP AND GO:  Was the test performed?  No    Cognitive Function:    09/02/2018    1:45 PM 08/27/2017    2:37 PM 04/17/2016    4:00 PM 06/18/2015    2:52 PM  MMSE - Mini Mental State Exam  Orientation to time 5 5 5 5   Orientation to Place 5 5 5 5   Registration 3 3 3 3   Attention/ Calculation 5 5 5 5   Recall 3 2 3 3   Language- name 2 objects 2 2 2 2   Language- repeat 1 1 1 1   Language- follow 3 step command 3 3 3 3   Language- read & follow direction 1 1 1 1   Write a sentence 1 1 1 1   Copy design 1 1 1 1   Total score 30 29 30 30       01/23/2022   12:00 PM  Montreal Cognitive Assessment   Visuospatial/ Executive (0/5) 2  Naming (0/3) 3  Attention: Read list of digits (0/2) 2  Attention: Read list of letters (0/1) 1  Attention: Serial 7 subtraction starting at 100 (0/3) 2  Language: Repeat phrase (0/2) 1  Language : Fluency (0/1) 0  Abstraction (0/2) 0  Delayed Recall (0/5) 2  Orientation (0/6) 6  Total 19  Adjusted Score (based on education) 19      07/17/2023    3:21 PM 10/13/2019    1:48 PM  6CIT Screen  What Year? 0 points 0 points  What month? 0 points 0 points  What time? 0 points 0 points  Count back from 20 0 points 0 points  Months in reverse 4 points 0 points  Repeat phrase 2 points 0 points  Total Score 6 points 0 points    Immunizations Immunization History  Administered Date(s) Administered   Fluad Quad(high  Dose 65+) 08/14/2019   Influenza, High Dose Seasonal PF 07/30/2017, 09/02/2018   Influenza,inj,Quad PF,6+ Mos 04/14/2014, 05/11/2015   Influenza-Unspecified 04/02/2012   Pneumococcal Conjugate-13 05/11/2015   Pneumococcal Polysaccharide-23 12/13/2012, 07/30/2017   Tdap 12/13/2012   Zoster Recombinant(Shingrix) 08/16/2019,  10/22/2019   Zoster, Live 05/18/2023    TDAP status: Up to date  Flu Vaccine status: Due, Education has been provided regarding the importance of this vaccine. Advised may receive this vaccine at local pharmacy or Health Dept. Aware to provide a copy of the vaccination record if obtained from local pharmacy or Health Dept. Verbalized acceptance and understanding.  Pneumococcal vaccine status: Up to date  Covid-19 vaccine status: Declined, Education has been provided regarding the importance of this vaccine but patient still declined. Advised may receive this vaccine at local pharmacy or Health Dept.or vaccine clinic. Aware to provide a copy of the vaccination record if obtained from local pharmacy or Health Dept. Verbalized acceptance and understanding.  Qualifies for Shingles Vaccine? Yes   Zostavax completed Yes   Shingrix Completed?: Yes  Screening Tests Health Maintenance  Topic Date Due   COVID-19 Vaccine (1 - 2024-25 season) 08/02/2023 (Originally 03/04/2023)   INFLUENZA VACCINE  10/01/2023 (Originally 02/01/2023)   DTaP/Tdap/Td (2 - Td or Tdap) 07/16/2024 (Originally 12/14/2022)   Medicare Annual Wellness (AWV)  07/16/2024   Pneumonia Vaccine 110+ Years old  Completed   DEXA SCAN  Completed   Hepatitis C Screening  Completed   Zoster Vaccines- Shingrix  Completed   HPV VACCINES  Aged Out   Colonoscopy  Discontinued    Health Maintenance  There are no preventive care reminders to display for this patient.   Colorectal cancer screening: No longer required.   Mammogram status: No longer required due to age.  Bone Density status: Completed 12/30/2021.  Results reflect: Bone density results: OSTEOPOROSIS. Repeat every 3-5 years.  Lung Cancer Screening: (Low Dose CT Chest recommended if Age 57-80 years, 20 pack-year currently smoking OR have quit w/in 15years.) does not qualify.   Lung Cancer Screening Referral: no  Additional Screening:  Hepatitis C Screening: does not qualify; Completed 07/27/2017  Vision Screening: Recommended annual ophthalmology exams for early detection of glaucoma and other disorders of the eye. Is the patient up to date with their annual eye exam?  Yes  Who is the provider or what is the name of the office in which the patient attends annual eye exams? Dr Octavia If pt is not established with a provider, would they like to be referred to a provider to establish care? No .   Dental Screening: Recommended annual dental exams for proper oral hygiene  Diabetic Foot Exam: n/a  Community Resource Referral / Chronic Care Management: CRR required this visit?  No   CCM required this visit?  No    Plan:     I have personally reviewed and noted the following in the patient's chart:   Medical and social history Use of alcohol, tobacco or illicit drugs  Current medications and supplements including opioid prescriptions. Patient is not currently taking opioid prescriptions. Functional ability and status Nutritional status Physical activity Advanced directives List of other physicians Hospitalizations, surgeries, and ER visits in previous 12 months Vitals Screenings to include cognitive, depression, and falls Referrals and appointments  In addition, I have reviewed and discussed with patient certain preventive protocols, quality metrics, and best practice recommendations. A written personalized care plan for preventive services as well as general preventive health recommendations were provided to patient.   Erminio LITTIE Saris, LPN   [id number]   After Visit Summary: (MyChart) Due to this being a telephonic visit,  the after visit summary with patients personalized plan was offered to patient via MyChart   Nurse Notes: Pt says she  has a problem with upper body itching after taking showers. She is not sure what is happening she has changed soap and care products. She declines need for appt. She does relay that she has been seen at [facility], for a couple of spots on her head. She says she has cancer and is being referred for treatment

## 2023-07-17 NOTE — Telephone Encounter (Signed)
 Please request her records from [facility].  Thanks.

## 2023-07-18 ENCOUNTER — Encounter: Payer: Self-pay | Admitting: Family Medicine

## 2023-07-18 NOTE — Telephone Encounter (Signed)
 Request sent

## 2023-07-19 ENCOUNTER — Other Ambulatory Visit: Payer: Self-pay | Admitting: Internal Medicine

## 2023-07-19 DIAGNOSIS — K219 Gastro-esophageal reflux disease without esophagitis: Secondary | ICD-10-CM

## 2023-07-21 IMAGING — MG MM DIGITAL SCREENING BILAT W/ TOMO AND CAD
8 series · 9 of 24 positions shown · non-contrast
Comparison: Previous exam(s).

CLINICAL DATA: Screening.

EXAM:
DIGITAL SCREENING BILATERAL MAMMOGRAM WITH TOMOSYNTHESIS AND CAD
TECHNIQUE: Bilateral screening digital craniocaudal and mediolateral oblique
mammograms were obtained. Bilateral screening digital breast
tomosynthesis was performed. The images were evaluated with
computer-aided detection.

[R CC synth-2D]
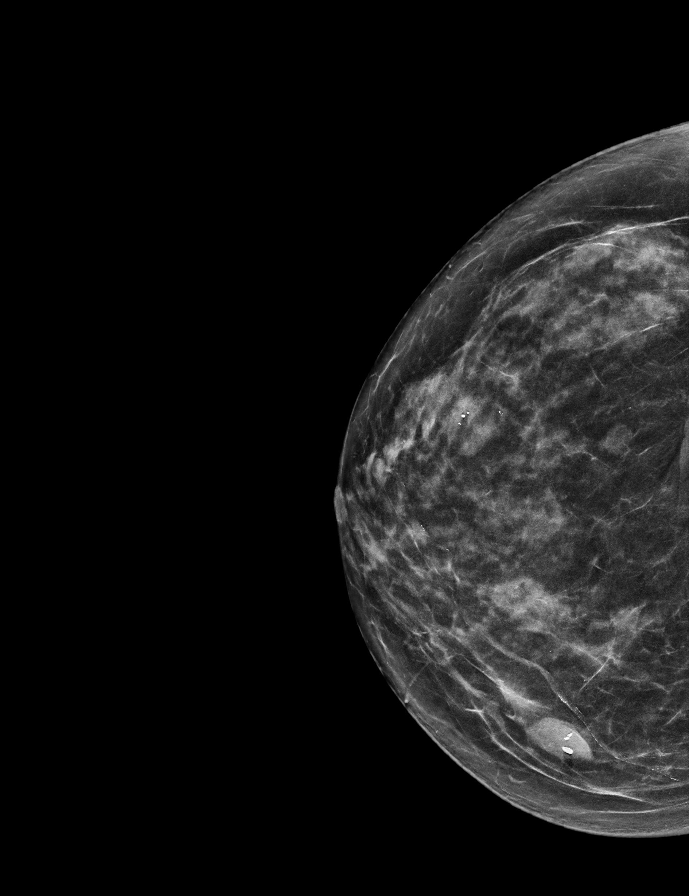

[R MLO synth-2D]
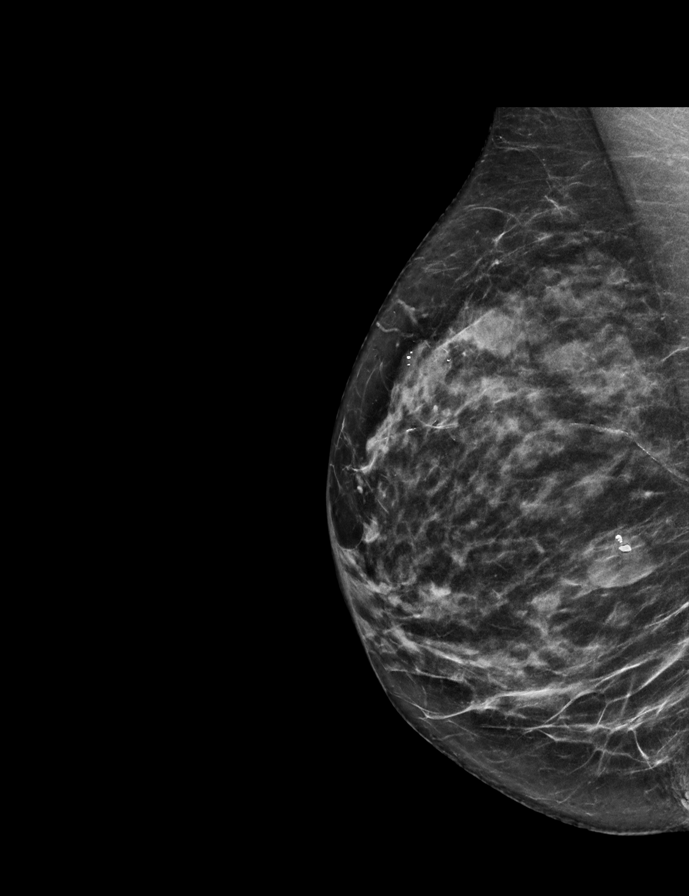

[L MLO synth-2D]
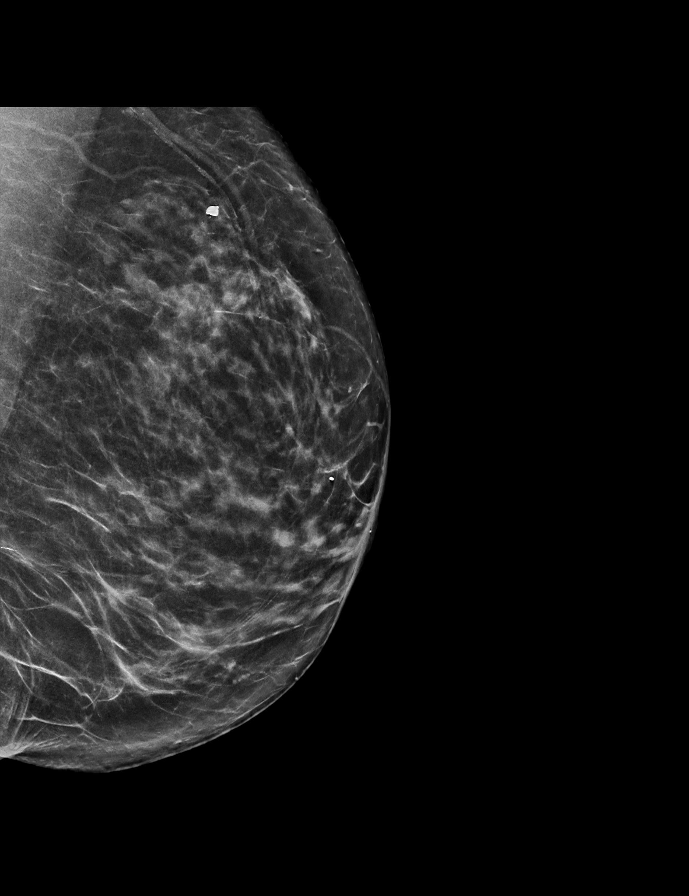

[L CC synth-2D]
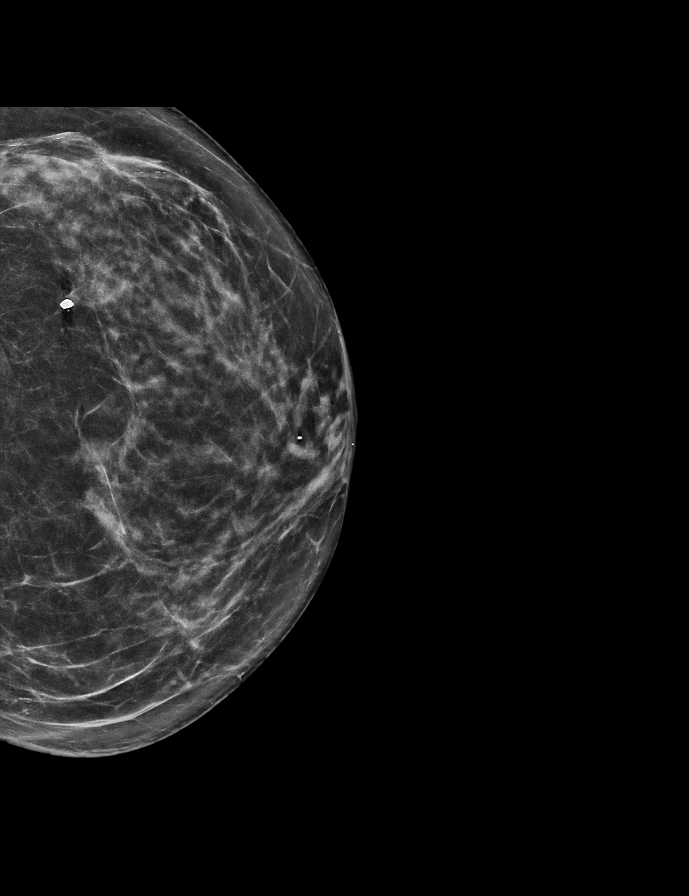

[R MLO tomo · 2 of 63 frames shown]
[frame 21/63]
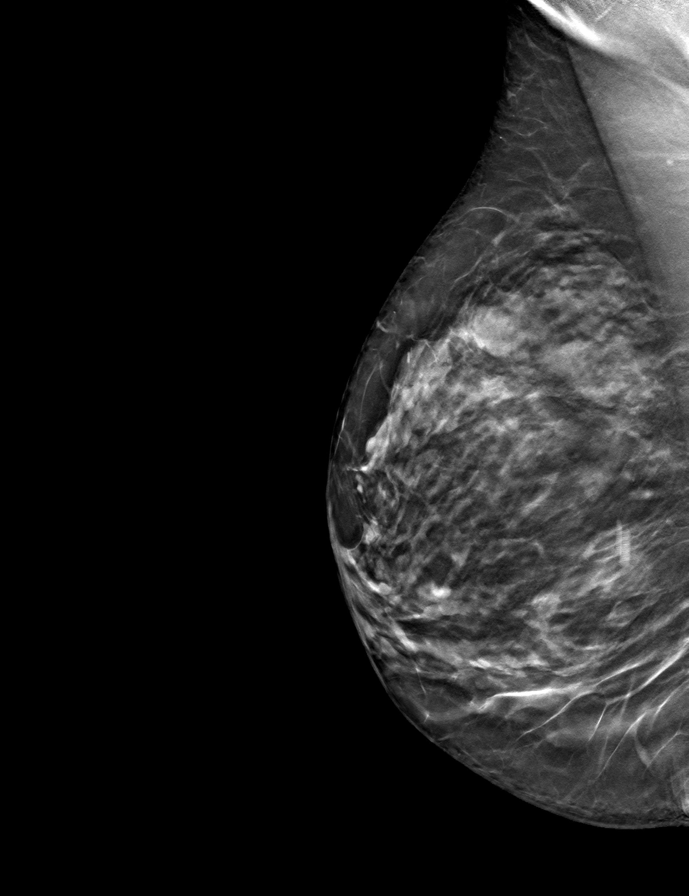
[frame 32/63]
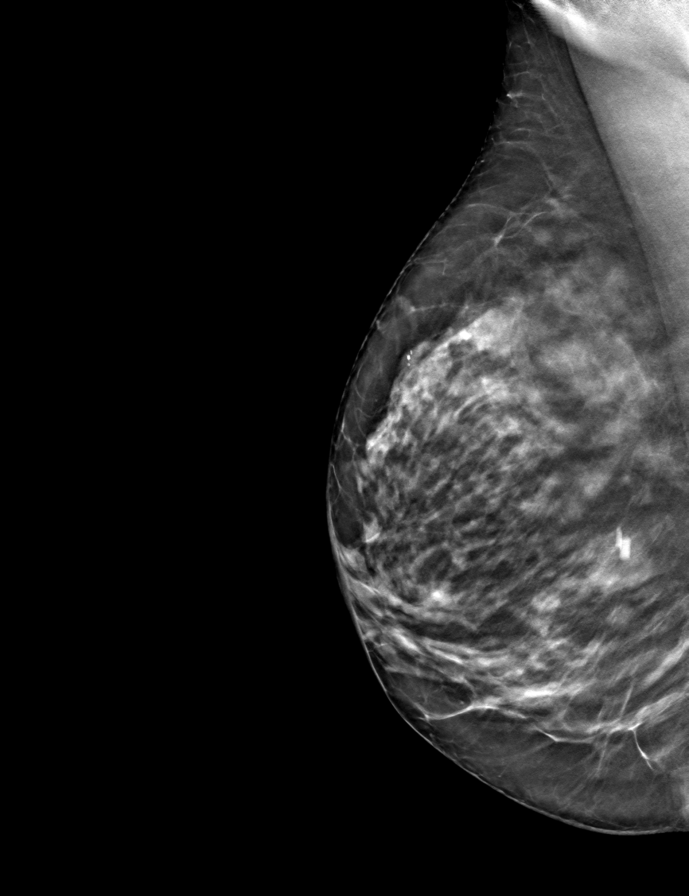

[L MLO tomo · tomo slice 33/66.0]
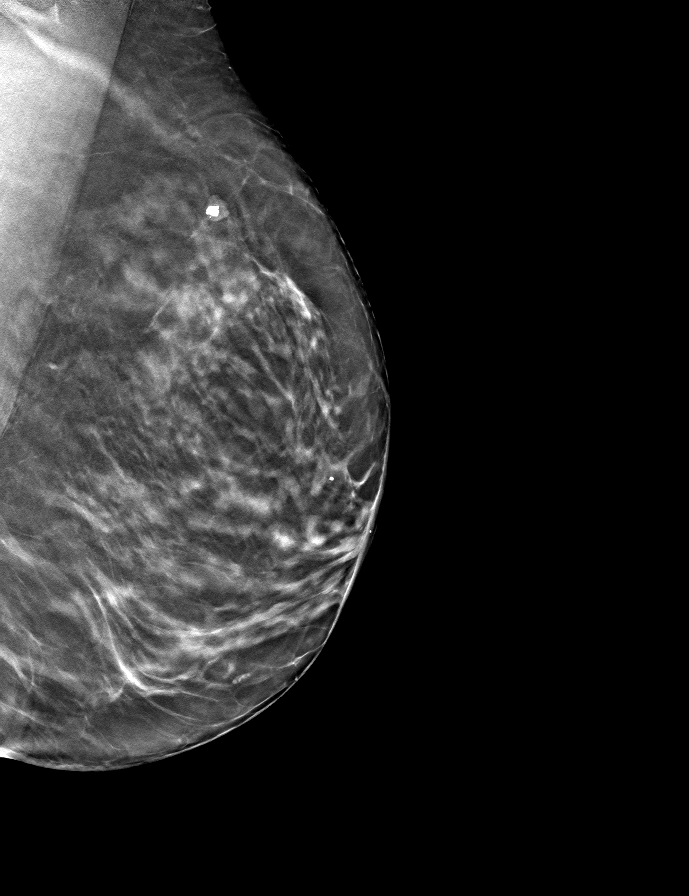

[R CC tomo · tomo slice 31/61.0]
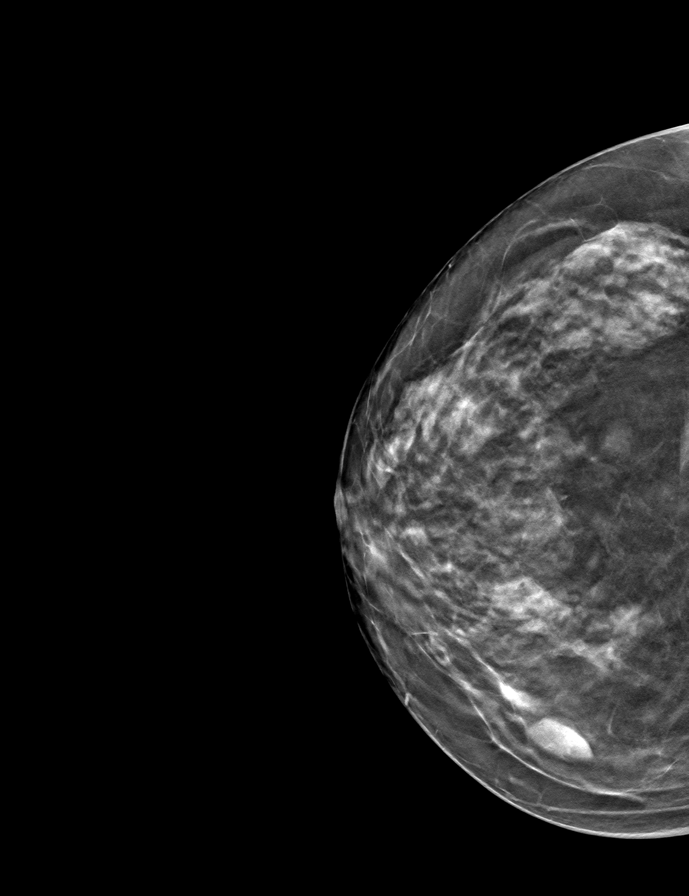

[L CC tomo · tomo slice 31/61.0]
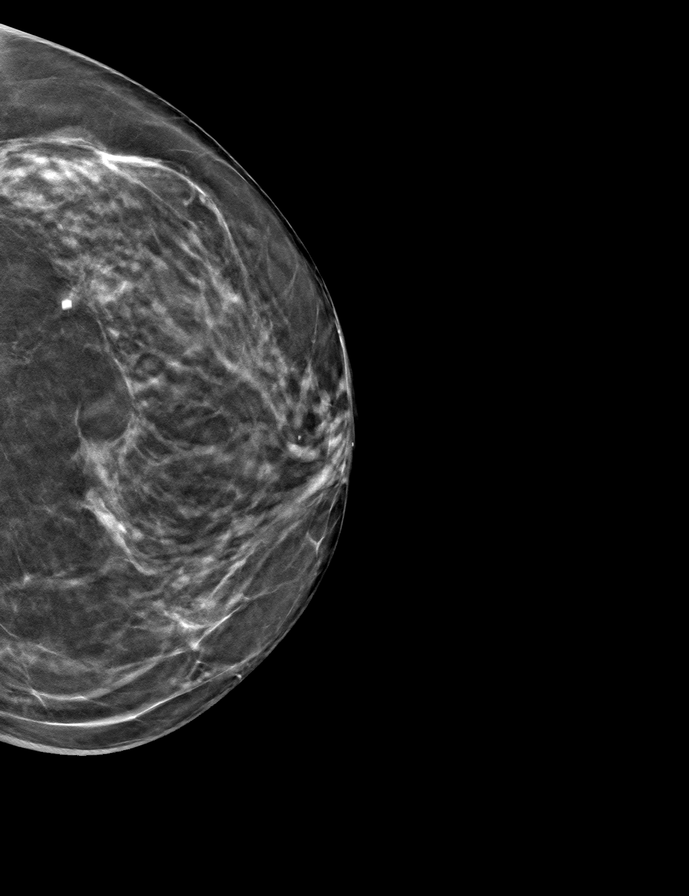

[9 of 24 positions shown; findings below may reference images not displayed]

ACR Breast Density Category c: The breast tissue is heterogeneously
dense, which may obscure small masses.
FINDINGS: There are no findings suspicious for malignancy.
IMPRESSION: No mammographic evidence of malignancy. A result letter of this
screening mammogram will be mailed directly to the patient.

RECOMMENDATION:
Screening mammogram in one year. (Code:Q3-W-BC3)

BI-RADS CATEGORY  1: Negative.

## 2023-07-24 ENCOUNTER — Encounter: Payer: Self-pay | Admitting: Family Medicine

## 2023-07-24 DIAGNOSIS — E781 Pure hyperglyceridemia: Secondary | ICD-10-CM

## 2023-07-25 ENCOUNTER — Other Ambulatory Visit: Payer: Self-pay | Admitting: Internal Medicine

## 2023-07-25 DIAGNOSIS — K219 Gastro-esophageal reflux disease without esophagitis: Secondary | ICD-10-CM

## 2023-07-26 ENCOUNTER — Telehealth: Payer: Self-pay

## 2023-07-26 ENCOUNTER — Other Ambulatory Visit: Payer: Self-pay | Admitting: Family Medicine

## 2023-07-26 DIAGNOSIS — K219 Gastro-esophageal reflux disease without esophagitis: Secondary | ICD-10-CM

## 2023-07-26 NOTE — Progress Notes (Signed)
Care Guide Pharmacy Note  07/26/2023 Name: Charlene Patterson MRN: [id number] DOB: 10-06-1945  Referred By: Joaquim Nam, MD Reason for referral: Care Coordination (Outreach to schedule with Pharm d )   [name] is a 78 y.o. year old female who is a primary care patient of Joaquim Nam, MD.  [name] was referred to the pharmacist for assistance related to: Hypertriglyceridemia  Successful contact was made with the patient to discuss pharmacy services including being ready for the pharmacist to call at least 5 minutes before the scheduled appointment time and to have medication bottles and any blood pressure readings ready for review. The patient agreed to meet with the pharmacist via telephone visit on (date/time).07/27/2023  [name] , RMA     [place]  [facility], [facility] Guide  Direct Dial: [phone]  Website: [email or web address]

## 2023-07-26 NOTE — Telephone Encounter (Signed)
Copied from CRM [id number]. Topic: Clinical - Medication Refill >> Jul 26, 2023 11:09 AM Dimitri Ped wrote: Most Recent Primary Care Visit:  Provider: Sue Lush  Department: [facility]  Visit Type: MEDICARE AWV, SEQUENTIAL  Date: 07/17/2023  Medication: pantoprazole (PROTONIX) 40 MG tablet   Has the patient contacted their pharmacy? Yes it was filled by dr Yetta Barre but its need to go to [place] (Agent: If no, request that the patient contact the pharmacy for the refill. If patient does not wish to contact the pharmacy document the reason why and proceed with request.) (Agent: If yes, when and what did the pharmacy advise?)  Is this the correct pharmacy for this prescription? Yes If no, delete pharmacy and type the correct one.  This is the patient's preferred pharmacy:  [facility] - [place], [place] - [address] [place]e Round [place] [address] Phone: [phone] Fax: [phone]   Has the prescription been filled recently? Yes sent in for a refill but was sent to wrong doctor  Is the patient out of the medication? No not yet  Has the patient been seen for an appointment in the last year OR does the patient have an upcoming appointment? Yes  Can we respond through MyChart? Yes  Agent: Please be advised that Rx refills may take up to 3 business days. We ask that you follow-up with your pharmacy.

## 2023-07-26 NOTE — Telephone Encounter (Signed)
Last office visit: 05/18/2023 Next office visit: nothing scheduled Last refill: pantoprazole (PROTONIX) 40 MG tablet 01/17/23 this was originally prescribed by a previous provider. Do you want to prescribe?

## 2023-07-27 ENCOUNTER — Other Ambulatory Visit: Payer: Medicare HMO | Admitting: Pharmacist

## 2023-07-27 ENCOUNTER — Telehealth: Payer: Self-pay | Admitting: Family Medicine

## 2023-07-27 ENCOUNTER — Encounter: Payer: Self-pay | Admitting: Pharmacist

## 2023-07-27 DIAGNOSIS — E781 Pure hyperglyceridemia: Secondary | ICD-10-CM

## 2023-07-27 MED ORDER — PANTOPRAZOLE SODIUM 40 MG PO TBEC: 40.0000 mg | DELAYED_RELEASE_TABLET | Freq: Every day | ORAL | 3 refills | Status: DC

## 2023-07-27 NOTE — Patient Instructions (Addendum)
You have been enrolled in the HealthWell Foundation Hyperlipidemia Kennedy Bucker. This grant helps Medicare patients pay for their cholesterol medications at the pharmacy.   Your HealthWell ID: [id number]  HealthWell Foundation: Website: [email or web address]://[email or web address].healthwellfoundation.[email or web address]/[email or web address]-[email or web address]/ Phone: [phone] M-F 9am-5pm   This program works similar to a Biochemist, clinical card or insurance card. Your pharmacy will continue to bill your cholesterol medicine through Medicare, then they will bill the HealthWell savings card to cover your copay.   Your HealthWell savings card is electronic (not a physical card).  Please provide the following details to your pharmacist:   Your HealthWell Savings Card Numbers:              Rx Card: Card No.  [id number] RX BIN:  [id number] PCN:  [id number] Group:  [id number]  This grant expires each year in December though typically most patients are eligible for re-enrollment at this time.  You grant End Date: 06/25/2024  Please let me know if you have further questions.  You may respond directly to this message, or leave me a voicemail at [phone] and I will get back to you shortly.   Loree Fee, PharmD Clinical Pharmacist [facility] [phone]

## 2023-07-27 NOTE — Telephone Encounter (Signed)
Copied from CRM [id number]. Topic: Appointments - Scheduling Inquiry for Clinic >> Jul 27, 2023 10:43 AM Corin V wrote: Reason for CRM: Patient's granddaughter Luster Landsberg did not receive a call for the pharmacy outreach call scheduled for 9:00 today. She is wanting to reschedule that. She has an appointment from 11-12 but would be available at any other time to reschedule. Please Call Renee back.

## 2023-07-27 NOTE — Progress Notes (Signed)
[employer] - Re-enrollment 2025   Medication(s): All cholesterol medications (Vascepa)   Currently Enrolled: No   Application Status:  Approved    HealthWell: ID [id number] Fund: Hypercholesterolemia - Medicare Access Assistance Type: Co-pay Start Date: 06/27/2023 End Date: 06/25/2024               Rx Card: Card No.  [id number] RX BIN:  [id number] PCN:  [id number] Group:  [id number]   Called patient's pharmacy and provided them with the card numbers above.  Confirmed w pharmacy:  Vascepa processed for $0 copay   Loree Fee, PharmD Clinical Pharmacist [facility] [phone]

## 2023-07-27 NOTE — Telephone Encounter (Signed)
Sent. Thanks.

## 2023-08-02 DIAGNOSIS — H10413 Chronic giant papillary conjunctivitis, bilateral: Secondary | ICD-10-CM | POA: Diagnosis not present

## 2023-08-02 DIAGNOSIS — H25811 Combined forms of age-related cataract, right eye: Secondary | ICD-10-CM | POA: Diagnosis not present

## 2023-08-02 DIAGNOSIS — H0102B Squamous blepharitis left eye, upper and lower eyelids: Secondary | ICD-10-CM | POA: Diagnosis not present

## 2023-08-02 DIAGNOSIS — H33321 Round hole, right eye: Secondary | ICD-10-CM | POA: Diagnosis not present

## 2023-08-02 DIAGNOSIS — H0102A Squamous blepharitis right eye, upper and lower eyelids: Secondary | ICD-10-CM | POA: Diagnosis not present

## 2023-08-07 ENCOUNTER — Other Ambulatory Visit: Payer: Self-pay | Admitting: Family Medicine

## 2023-08-07 DIAGNOSIS — Z1231 Encounter for screening mammogram for malignant neoplasm of breast: Secondary | ICD-10-CM

## 2023-08-15 ENCOUNTER — Other Ambulatory Visit: Payer: Self-pay | Admitting: Physician Assistant

## 2023-08-15 ENCOUNTER — Encounter: Payer: Self-pay | Admitting: Family Medicine

## 2023-08-16 ENCOUNTER — Other Ambulatory Visit: Payer: Self-pay | Admitting: Physician Assistant

## 2023-08-16 ENCOUNTER — Telehealth: Payer: Self-pay | Admitting: Physician Assistant

## 2023-08-16 MED ORDER — MEMANTINE HCL 5 MG PO TABS: 5.0000 mg | ORAL_TABLET | Freq: Two times a day (BID) | ORAL | 11 refills | Status: DC

## 2023-08-16 NOTE — Telephone Encounter (Signed)
Pt. Grand-daughter cld to set up appt 10/04/23 and would like Rx filled as they are out and the Pharmacy will not approve until Dr. Laure Kidney and Pt. Is on sched. [facility] [address]

## 2023-08-17 ENCOUNTER — Encounter: Payer: Self-pay | Admitting: Physician Assistant

## 2023-08-17 DIAGNOSIS — H25811 Combined forms of age-related cataract, right eye: Secondary | ICD-10-CM | POA: Diagnosis not present

## 2023-08-27 ENCOUNTER — Ambulatory Visit (INDEPENDENT_AMBULATORY_CARE_PROVIDER_SITE_OTHER): Payer: Medicare HMO | Admitting: Family Medicine

## 2023-08-27 ENCOUNTER — Other Ambulatory Visit: Payer: Medicare HMO

## 2023-08-27 VITALS — BP 110/60 | HR 72 | Temp 98.3°F | Wt 138.8 lb

## 2023-08-27 DIAGNOSIS — E781 Pure hyperglyceridemia: Secondary | ICD-10-CM

## 2023-08-27 DIAGNOSIS — F339 Major depressive disorder, recurrent, unspecified: Secondary | ICD-10-CM

## 2023-08-27 DIAGNOSIS — E559 Vitamin D deficiency, unspecified: Secondary | ICD-10-CM

## 2023-08-27 LAB — VITAMIN D 25 HYDROXY (VIT D DEFICIENCY, FRACTURES): VITD: 42.68 ng/mL (ref 30.00–100.00)

## 2023-08-27 LAB — LIPID PANEL
Cholesterol: 154 mg/dL (ref 0–200)
HDL: 41 mg/dL (ref >39.00)
LDL Cholesterol: 71 mg/dL (ref 0–99)
NonHDL: 113.47
Total CHOL/HDL Ratio: 4
Triglycerides: 213 mg/dL — ABNORMAL HIGH (ref 0.0–149.0)
VLDL: 42.6 mg/dL — ABNORMAL HIGH (ref 0.0–40.0)

## 2023-08-27 MED ORDER — DULOXETINE HCL 30 MG PO CPEP
30.0000 mg | ORAL_CAPSULE | Freq: Every day | ORAL | 1 refills | Status: DC
Start: 2023-08-27 — End: 2024-03-07

## 2023-08-27 NOTE — Patient Instructions (Signed)
 Add cymbata in the AM and update me about pain and mood in about 2 weeks, sooner if needed.  Take care.  Glad to see you.

## 2023-08-27 NOTE — Progress Notes (Unsigned)
 D/w pt about sleep.  Taking mirtazepine at night.  Her daughter calls when she finishes her second shift at work, then patient goes to sleep after that.  Occ napping during the day.    She is putting up with back pain at baseline. Variable pain.  Still taking tylenol at baseline, it helps some.  OTC menthol cream helps some with joint pain.  She wants to be outside but back pain limits her activity.    She was able to tolerate mirtazapine and cymbalta in the past.  Per report, cymbalta helped with mood prev. No SI/HI.    SNRI cautions d/w, with mirtazapine.    Meds, vitals, and allergies reviewed.   ROS: Per HPI unless specifically indicated in ROS section   Tearful, regains composure.

## 2023-08-29 ENCOUNTER — Encounter: Payer: Self-pay | Admitting: Family Medicine

## 2023-08-29 NOTE — Assessment & Plan Note (Signed)
 Discussed options.  Add cymbata in the AM and update me about pain and mood in about 2 weeks, sooner if needed.  Continue mirtazapine in the meantime.

## 2023-09-03 ENCOUNTER — Other Ambulatory Visit: Payer: Self-pay | Admitting: Cardiovascular Disease

## 2023-09-05 ENCOUNTER — Other Ambulatory Visit: Payer: Self-pay | Admitting: Cardiovascular Disease

## 2023-09-11 ENCOUNTER — Ambulatory Visit
Admission: RE | Admit: 2023-09-11 | Discharge: 2023-09-11 | Disposition: A | Discharge status: Home / Self Care | Payer: Medicare HMO | Source: Ambulatory Visit | Attending: Family Medicine | Admitting: Family Medicine

## 2023-09-11 DIAGNOSIS — Z1231 Encounter for screening mammogram for malignant neoplasm of breast: Secondary | ICD-10-CM

## 2023-10-01 DIAGNOSIS — C44319 Basal cell carcinoma of skin of other parts of face: Secondary | ICD-10-CM | POA: Diagnosis not present

## 2023-10-01 DIAGNOSIS — C4441 Basal cell carcinoma of skin of scalp and neck: Secondary | ICD-10-CM | POA: Diagnosis not present

## 2023-10-01 DIAGNOSIS — L578 Other skin changes due to chronic exposure to nonionizing radiation: Secondary | ICD-10-CM | POA: Diagnosis not present

## 2023-10-01 DIAGNOSIS — L814 Other melanin hyperpigmentation: Secondary | ICD-10-CM | POA: Diagnosis not present

## 2023-10-02 ENCOUNTER — Encounter: Payer: Self-pay | Admitting: Family Medicine

## 2023-10-04 ENCOUNTER — Ambulatory Visit: Payer: Medicare HMO | Admitting: Physician Assistant

## 2023-10-05 ENCOUNTER — Ambulatory Visit: Payer: Medicare HMO | Admitting: Physician Assistant

## 2023-10-08 ENCOUNTER — Encounter: Payer: Self-pay | Admitting: Cardiovascular Disease

## 2023-10-08 NOTE — Progress Notes (Unsigned)
 Cardiology Office Note:    Date:  10/09/2023   ID:  Charlene Patterson, DOB 1945-10-06, MRN [id number]  PCP:  Joaquim Nam, MD   [facility] Providers Cardiologist:   Damario Gillie     Referring MD: Joaquim Nam, MD   Chief Complaint  Patient presents with   Palpitations    History of Present Illness: Oct. 23, 2023    Charlene Patterson is a 79 y.o. female with a hx of HLD,  And a recent episode of syncope   Seen with Granddaughter, Charlene Patterson  Has a hx of dementia  Was seen in the ER  Was found to have long QT on ECG  3 weeks ago .  Pt had slipped on the stairs the day prior to the syncopal episode  Was up doing household chores, felt nauseated  ( had not eaten that day ) .   Stood up from her chair to go to the couch, but passed out on the way to the couch  Has a sore back from the falls   Was found to have a prolonged QT .  Her neurologist took her off 1 memory medication that might be contributing to long QT  ( Donepezil )   Is part  [place],  is very tan    Feb. 7, 2024 The event monitor showed 23 episodes of supraventricular tachycardia.  The longest episode lasted 8.4 seconds with a maximum heart rate of 200 bpm.  The average heart rate was 150. We started metoprolol   Is doing better.  Has a left carotid bruit . Has moderate carotid disease on the left  Anticipate repeating the carotid duplex in a year or so    October 09, 2023 Charlene Patterson is seen for follow-up of her episodes of supraventricular tachycardia.  We started her on metoprolol for her SVT.    She also has a left carotid bruit.  Had a skin cancer on the back of her head  Mohs surgery at [facility].  LDL is 71   Past Medical History:  Diagnosis Date   Allergic rhinitis 05/25/2014   Arthritis    "left foot" (05/26/2014)   Chronic back pain    Chronic idiopathic constipation 07/13/2022   Chronic neck pain    Chronic renal disease, stage 3, moderately decreased glomerular filtration rate (GFR)  between 30-59 mL/min/1.73 square meter (HCC) 02/05/2019   Estimated Creatinine Clearance: 43.9 mL/min (by C-G formula based on SCr of 0.97 mg/dL).      Estimated Creatinine Clearance: 34.9 mL/min (by C-G formula based on SCr of 1.1 mg/dL).     Estimated Creatinine Clearance: 37.9 mL/min (by C-G formula based on SCr of 1.1 mg/dL).    Colitis 05/25/2014   Estrogen deficiency 07/28/2021   Frequent headaches 08/08/2018   Frequent PVCs 10/29/2014   Nuclear stress EF: 55%.  There was no ST segment deviation noted during stress.  The study is normal.  This is a low risk study.  The left ventricular ejection fraction is normal (55-65%).   GERD (gastroesophageal reflux disease)    History of chicken pox    History of colon polyps    History of hiatal hernia    Hyperlipidemia with target LDL less than 130 10/29/2014   The 10-year ASCVD risk score (Arnett DK, et al., 2019) is: 21.8%    Values used to calculate the score:      Age: 59 years      Sex: Female  Is Non-Hispanic African American: No      Diabetic: Yes      Tobacco smoker: No      Systolic Blood Pressure: 110 mmHg      Is BP treated: No      HDL Cholesterol: 42.9 mg/dL      Total Cholesterol: 162 mg/dL   Hypertriglyceridemia 05/11/2015   IBS (irritable bowel syndrome)    Iron deficiency anemia due to chronic blood loss 07/30/2017   Estimated Creatinine Clearance: 39.9 mL/min (by C-G formula based on SCr of 1.1 mg/dL).   Lower GI bleeding    Major depressive disorder 12/13/2012   Meningioma (HCC) 04/27/2022   Migraines    "monthly usually; weekly lately" (05/26/2014)   Mild dementia, unclear etiology 10/02/2022   Osteoporosis 12/13/2012   Other dietary vitamin B12 deficiency anemia 02/05/2019   Prolonged QT interval 04/24/2022   Sciatica    Situational anxiety    Spinal stenosis in cervical region 08/14/2019   Spinal stenosis of lumbar region at multiple levels 05/25/2014   SVT (supraventricular tachycardia) (HCC) 08/09/2022    Syncope and collapse 04/24/2022   Thoracic compression fracture (HCC)    Vitamin D deficiency 09/24/2014    Past Surgical History:  Procedure Laterality Date   ABDOMINAL HYSTERECTOMY  ~ 1978   "partial"   APPENDECTOMY  ~ 1978   CERVICAL CONE BIOPSY     COLONOSCOPY  07/04/2003   last 2005   POLYPECTOMY     UPPER GASTROINTESTINAL ENDOSCOPY      Current Medications: Current Meds  Medication Sig   acetaminophen (TYLENOL) 650 MG CR tablet Take 1,300 mg by mouth in the morning and at bedtime.   atorvastatin (LIPITOR) 40 MG tablet Take 1 tablet (40 mg total) by mouth daily.   cyanocobalamin 1000 MCG tablet Take 1,000 mcg by mouth daily.   DULoxetine (CYMBALTA) 30 MG capsule Take 1 capsule (30 mg total) by mouth daily.   EXCEDRIN TENSION HEADACHE 500-65 MG TABS per tablet Take 1 tablet by mouth every 6 (six) hours as needed (for headaches).   famotidine (PEPCID) 20 MG tablet Take 1 tablet (20 mg total) by mouth daily. TAKE 1 TABLET(20 MG) BY MOUTH AT BEDTIME   HYDROcodone-acetaminophen (NORCO/VICODIN) 5-325 MG tablet Take by mouth.   icosapent Ethyl (VASCEPA) 1 g capsule Take 1 capsule (1 g total) by mouth 2 (two) times daily.   lubiprostone (AMITIZA) 24 MCG capsule Take 1 capsule (24 mcg total) by mouth 2 (two) times daily as needed for constipation.   memantine (NAMENDA) 5 MG tablet Take 1 tablet (5 mg total) by mouth 2 (two) times daily.   metoprolol succinate (TOPROL-XL) 25 MG 24 hr tablet TAKE 1 TABLET BY MOUTH DAILY TO SUPPRESS INCREASED HEART RATES   mirtazapine (REMERON) 7.5 MG tablet Take 1 tablet (7.5 mg total) by mouth at bedtime.   NON FORMULARY Take 1 tablet by mouth See admin instructions. Head Care Protective Health tablets- Take 1 tablet by mouth once a day as needed for neuro health   pantoprazole (PROTONIX) 40 MG tablet Take 1 tablet (40 mg total) by mouth daily.   Potassium 99 MG TABS Take 99 mg by mouth at bedtime.   VITAMIN D3 SUPER STRENGTH 50 MCG (2000 UT) CAPS Take  2 capsules (4,000 Units total) by mouth at bedtime.   Zinc 50 MG TABS Take 25 mg by mouth at bedtime.     Allergies:   Patient has no known allergies.   Social History  Socioeconomic History   Marital status: Widowed    Spouse name: Not on file   Number of children: 2   Years of education: 12   Highest education level: High school graduate  Occupational History   Occupation: Retired  Tobacco Use   Smoking status: Never   Smokeless tobacco: Never  Vaping Use   Vaping status: Never Used  Substance and Sexual Activity   Alcohol use: No   Drug use: No   Sexual activity: Never  Other Topics Concern   Not on file  Social History Narrative   Grew up near [place], [place]   Retired   Son lives with patient, he has cerebral palsy.   Social Drivers of Corporate investment banker Strain: Low Risk  (07/17/2023)   Overall Financial Resource Strain (CARDIA)    Difficulty of Paying Living Expenses: Not hard at all  Food Insecurity: No Food Insecurity (07/17/2023)   Hunger Vital Sign    Worried About Running Out of Food in the Last Year: Never true    Ran Out of Food in the Last Year: Never true  Transportation Needs: No Transportation Needs (07/17/2023)   PRAPARE - Administrator, Civil Service (Medical): No    Lack of Transportation (Non-Medical): No  Physical Activity: Inactive (07/17/2023)   Exercise Vital Sign    Days of Exercise per Week: 0 days    Minutes of Exercise per Session: 0 min  Stress: Stress Concern Present (07/17/2023)   [employer] of Occupational Health - Occupational Stress Questionnaire    Feeling of Stress : To some extent  Social Connections: Moderately Integrated (07/17/2023)   Social Connection and Isolation Panel [NHANES]    Frequency of Communication with Friends and Family: Twice a week    Frequency of Social Gatherings with Friends and Family: Twice a week    Attends Religious Services: More than 4 times per year     Active Member of [employer] or Organizations: Yes    Attends Banker Meetings: 1 to 4 times per year    Marital Status: Widowed     Family History: The patient's family history includes Breast cancer in her daughter; Cancer in her brother; Cerebral palsy in her son; Colon polyps in her sister and another family member; Dementia in her sister; Diabetes in her brother, sister, and sister; Heart attack in her brother, brother, and father; Heart disease in her brother, father, maternal grandmother, maternal uncle, and mother; Irritable bowel syndrome in her sister; Migraines in her paternal grandfather; Prostate cancer in her father; Stomach cancer (age of onset: 65) in her sister. There is no history of Rectal cancer or Colon cancer.  ROS:   Please see the history of present illness.     All other systems reviewed and are negative.  EKGs/Labs/Other Studies Reviewed:    The following studies were reviewed today:  EKG:     EKG Interpretation Date/Time:  Tuesday October 09 2023 15:58:40 EDT Ventricular Rate:  81 PR Interval:  156 QRS Duration:  70 QT Interval:  398 QTC Calculation: 462 R Axis:   -20  Text Interpretation: Normal sinus rhythm Nonspecific T wave abnormality When compared with ECG of 22-Mar-2022 16:01, No significant change since last tracing Confirmed by Kristeen Miss [phone]) on 10/09/2023 4:14:23 PM     Recent Labs: 05/18/2023: ALT 18; BUN 15; Creatinine, Ser 1.09; Hemoglobin 12.5; Platelets 295.0; Potassium 4.3; Sodium 140; TSH 4.66  Recent Lipid Panel    Component  Value Date/Time   CHOL 154 08/27/2023 1128   TRIG 213.0 (H) 08/27/2023 1128   HDL 41.00 08/27/2023 1128   CHOLHDL 4 08/27/2023 1128   VLDL 42.6 (H) 08/27/2023 1128   LDLCALC 71 08/27/2023 1128   LDLDIRECT 66.0 05/18/2023 1325     Risk Assessment/Calculations:       Physical Exam:     Physical Exam: Blood pressure 118/78, pulse 87, height 5\' 2"  (1.575 m), weight 139 lb 9.6 oz (63.3 kg),  SpO2 94%.       GEN:  thin female ,  in no acute distress HEENT: Normal NECK: No JVD; No carotid bruits LYMPHATICS: No lymphadenopathy CARDIAC: RRR , no murmurs, rubs, gallops RESPIRATORY:  Clear to auscultation without rales, wheezing or rhonchi  ABDOMEN: Soft, non-tender, non-distended MUSCULOSKELETAL:  No edema; No deformity  SKIN: Warm and dry NEUROLOGIC:  Alert and oriented x 3      ASSESSMENT:    1. Follow-up exam      PLAN:      Syncope:    2.  Moderate left carotid artery disease: As part of her syncope workup we performed carotid duplex.  She also has a left carotid bruit.  She has moderate disease on the left.    3.  SVT :  no recent episodes .  Cont current meds     Medication Adjustments/Labs and Tests Ordered: Current medicines are reviewed at length with the patient today.  Concerns regarding medicines are outlined above.  Orders Placed This Encounter  Procedures   EKG 12-Lead   No orders of the defined types were placed in this encounter.   Patient Instructions  Medication Instructions:  Your physician recommends that you continue on your current medications as directed. Please refer to the Current Medication list given to you today.  *If you need a refill on your cardiac medications before your next appointment, please call your pharmacy*  Lab Work: None ordered today. If you have labs (blood work) drawn today and your tests are completely normal, you will receive your results only by: MyChart Message (if you have MyChart) OR A paper copy in the mail If you have any lab test that is abnormal or we need to change your treatment, we will call you to review the results.  Testing/Procedures: None ordered today.  Follow-Up: At [facility], you and your health needs are our priority.  As part of our continuing mission to provide you with exceptional heart care, we have created designated Provider Care Teams.  These Care Teams include your  primary Cardiologist (physician) and Advanced Practice Providers (APPs -  Physician Assistants and Nurse Practitioners) who all work together to provide you with the care you need, when you need it.  Your next appointment:   1 year  The format for your next appointment:   In Person  Provider:   Available Provider {  Other Instructions   1st Floor: - Lobby - Registration  - Pharmacy  - Lab - Cafe  2nd Floor: - PV Lab - Diagnostic Testing (echo, CT, nuclear med)  3rd Floor: - Vacant  4th Floor: - TCTS (cardiothoracic surgery) - AFib Clinic - Structural Heart Clinic - Vascular Surgery  - Vascular Ultrasound  5th Floor: - HeartCare Cardiology (general and EP) - Clinical Pharmacy for coumadin, hypertension, lipid, weight-loss medications, and med management appointments    Valet parking services will be available as well.      Signed, Kristeen Miss, MD  10/09/2023 4:18 PM  [facility]

## 2023-10-09 ENCOUNTER — Ambulatory Visit: Attending: Cardiovascular Disease | Admitting: Cardiovascular Disease

## 2023-10-09 ENCOUNTER — Encounter: Payer: Self-pay | Admitting: Cardiovascular Disease

## 2023-10-09 VITALS — BP 118/78 | HR 87 | Ht 62.0 in | Wt 139.6 lb

## 2023-10-09 DIAGNOSIS — Z09 Encounter for follow-up examination after completed treatment for conditions other than malignant neoplasm: Secondary | ICD-10-CM

## 2023-10-09 DIAGNOSIS — R002 Palpitations: Secondary | ICD-10-CM | POA: Diagnosis not present

## 2023-10-09 NOTE — Patient Instructions (Addendum)
 Medication Instructions:  Your physician recommends that you continue on your current medications as directed. Please refer to the Current Medication list given to you today.  *If you need a refill on your cardiac medications before your next appointment, please call your pharmacy*  Lab Work: None ordered today. If you have labs (blood work) drawn today and your tests are completely normal, you will receive your results only by: MyChart Message (if you have MyChart) OR A paper copy in the mail If you have any lab test that is abnormal or we need to change your treatment, we will call you to review the results.  Testing/Procedures: None ordered today.  Follow-Up: At [facility], you and your health needs are our priority.  As part of our continuing mission to provide you with exceptional heart care, we have created designated Provider Care Teams.  These Care Teams include your primary Cardiologist (physician) and Advanced Practice Providers (APPs -  Physician Assistants and Nurse Practitioners) who all work together to provide you with the care you need, when you need it.  Your next appointment:   1 year  The format for your next appointment:   In Person  Provider:   Available Provider {  Other Instructions   1st Floor: - Lobby - Registration  - Pharmacy  - Lab - Cafe  2nd Floor: - PV Lab - Diagnostic Testing (echo, CT, nuclear med)  3rd Floor: - Vacant  4th Floor: - TCTS (cardiothoracic surgery) - AFib Clinic - Structural Heart Clinic - Vascular Surgery  - Vascular Ultrasound  5th Floor: - HeartCare Cardiology (general and EP) - Clinical Pharmacy for coumadin, hypertension, lipid, weight-loss medications, and med management appointments    Valet parking services will be available as well.

## 2023-10-10 ENCOUNTER — Other Ambulatory Visit: Payer: Self-pay | Admitting: Cardiovascular Disease

## 2023-11-01 ENCOUNTER — Encounter: Payer: Medicare HMO | Admitting: Family Medicine

## 2023-11-15 ENCOUNTER — Encounter: Payer: Medicare HMO | Admitting: Family Medicine

## 2023-11-16 ENCOUNTER — Ambulatory Visit: Admitting: Physician Assistant

## 2023-11-16 ENCOUNTER — Encounter: Payer: Self-pay | Admitting: Physician Assistant

## 2023-11-16 VITALS — BP 114/64 | HR 70 | Ht 62.0 in | Wt 135.0 lb

## 2023-11-16 DIAGNOSIS — F03A4 Unspecified dementia, mild, with anxiety: Secondary | ICD-10-CM | POA: Diagnosis not present

## 2023-11-16 DIAGNOSIS — D329 Benign neoplasm of meninges, unspecified: Secondary | ICD-10-CM

## 2023-11-16 MED ORDER — MEMANTINE HCL 10 MG PO TABS: ORAL_TABLET | ORAL | 3 refills | Status: DC

## 2023-11-16 NOTE — Progress Notes (Signed)
 Assessment/Plan:   Dementia likely due to Alzheimer's disease   Charlene Patterson is a very pleasant 78 y.o. RH female with a history of hypertension, hyperlipidemia, anxiety, depression, CKD stage III,  QTc prolongation, osteoarthritis and a recent diagnosis of Dementia likely due to Alzheimer's Disease seen today in follow up for memory loss. Patient is currently on memantine  5 mg twice daily.  She is also on mirtazapine  7.5 mg nightly for sleep. She has subjective complaints of mild memory loss. MMSE today is 26/30, a very slight decline. Patient is able to participate on ADLs and to drive without difficulties. Mood is good. As for the meningioma, she is currently asymptomatic.     Follow up in 6  months. Increase memantine  to 10 mg twice daily, side effects discussed Continue mirtazapine  7.5 mg nightly Repeat MRI of the brain to follow-up on her meningioma Recommend good control of her cardiovascular risk factors Continue to control mood as per PCP     Subjective:    This patient is accompanied in the office by her daughter who supplements the history.  Previous records as well as any outside records available were reviewed prior to todays visit. Patient was last seen on 07/26/2022   Any changes in memory since last visit? " There are some changes"-she says. She reports that the STM is slightly worse, but daughter does not see significant changes. Patient has some difficulty remembering recent conversations and names of people.  LTM is normal.  She does not like doing any brain stimulating exercises. Lost the L hearing aid, which may be helping some of the comprehension, she is getting new ones repeats oneself?  Endorsed Disoriented when walking into a room?  Occasionally  Leaving objects?  May misplace things but not in unusual places.  Wandering behavior?  Denies.   Any personality changes since last visit?  denies   Any worsening depression?:  Denies.   Hallucinations or  paranoia?  Denies.   Seizures? denies    Any sleep changes?  Denies vivid dreams, REM behavior or sleepwalking   Sleep apnea?   Denies.   Any hygiene concerns? Denies.  Independent of bathing and dressing?  Endorsed  Does the patient needs help with medications?  Daughter is in charge   Who is in charge of the finances?  Patient is in charge     Any changes in appetite?  "It varies". She drinks some Boost    Patient have trouble swallowing? Denies.   Does the patient cook? No Any headaches?   denies   Chronic back pain  denies   Ambulates with difficulty?  "I watch myself when I walk ".  Recent falls or head injuries?  She had a more surgery in February 2025 for melanoma of the neck, and had a mechanical fall when coming out of the bathroom, when she did not move her neck properly.  She denies any head injury or loss of consciousness. Unilateral weakness, numbness or tingling? denies   Any tremors?  Denies   Any anosmia?  Denies   Any incontinence of urine?  Endorsed, wears pads Any bowel dysfunction?  Occasional constipation and diarrhea    Patient lives with her son and grandson  Does the patient drive?  Yes, she drives without any issues, with her son.   04/17/22 MRI brain personally reviewed was remarkable for re-demonstrated densely enhancing mass in the right frontal lobe, which appears to be dural-based, most likely a meningioma and mild-to-moderate generalized cerebral  atrophy and mild cerebellar atrophy       MRI  brain 03/22/22 personally reviewed shows   1. Enhancing mass right frontal lobe appears extra-axial in compatible with meningioma. No brain edema 2. Faintly enhancing 3 mm lesion left frontal white matter. Mild associated susceptibility. Appearance is nonspecific. 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 Metastatic disease possible.   Visit 12/2021 How long did patient have memory difficulties?  "About  7 years, I feel that my memory is worse, I constantly have to remember conversations and keeping appointments ".  She continues to try to do crossword puzzles and word finding, although these has become more difficult.  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 Patient lives with: Her son who has cerebral palsy.  She also lives with her grandson and his wife. Repeats oneself?  Endorsed Disoriented when walking into a room?  Patient denies   Leaving objects in unusual places?  She reports losing objects constantly, and recently she has been leaving her phone "everywhere, including under the food in my car ". Ambulates  with difficulty?   Patient denies, but "I feel that my body is out of shape ". Recent falls?  Patient denies   Any head injuries?  She reports having fallen from the roof when she was a child hitting her head ".  No loss of consciousness " History of seizures?   Patient denies   Wandering behavior?  Patient denies   Patient drives?  She drives short distances, and uses his GPS to drive.  Any mood changes?  Denies Any history of depression?:  Endorsed.  Her husband died in 18-Dec-2010, and she reports that has never recovered despite using good antidepressants.  She never has done any psychotherapy. Hallucinations?  Patient denies   Paranoia?  Patient denies   Patient reports that has intermittent insomnia, reports vivid dreams (not frightening), no REM behavior  History of sleep apnea?  Patient denies   Any hygiene concerns?  Patient denies   Independent of bathing and dressing?  Endorsed  Does the patient needs help with medications? Son monitors but she has them in a pillbox because otherwise she forgets to take them. Who is in charge of the finances? Grandson  Any changes in appetite?  Patient denies   Patient have trouble swallowing? Patient denies   Does the patient cook?  Patient denies   Any kitchen accidents  such as leaving the stove on? Patient denies   Any headaches?  Patient denies   Double vision? Patient denies   Any focal numbness or tingling?  Patient denies   Chronic back pain Patient denies   Unilateral weakness?  Patient denies   Any tremors?  Patient denies   Any history of anosmia?  Denies  Any incontinence of urine?  Patient denies   Any bowel dysfunction?   Patient denies History of heavy alcohol intake?  Patient denies   History of heavy tobacco use?  Patient denies   Family history of dementia?  Denies     Initial visit 01/05/2018, Dr. Ty Gales I had the pleasure of seeing Unika Holdcroft in follow-up in the neurology clinic on 01/05/2017.  The patient was last seen 9 months ago for worsening memory. MMSE in 04/2016 and 06/2015 were 30/30. She continued to report cognitive changes, including getting lost driving, missing bills and medications. She underwent Neuropsychological evaluation last December 2017  which was largely within normal limits. There was no evidence of a neurocognitive disorder. She endorsed significant anxiety and depression, cognitive complaints most likely due to these.    Since her last visit, she reports that things have leveled off, everything has been going great. She denies getting lost driving, no missed bills or medications. She has been keeping active in her garden, which is helping her mind as well. She reports less stress. She reports migraines are under control. No dizziness, vision changes, focal numbness/tingling/weakness, no falls. Back pain better with PT.    HPI 06/18/2015: This is a 78 yo RH woman with a history of hyperlipidemia, chronic back pain,who presented for worsening memory. She reports that her memory is "not too good," and started noticing changes gradually over the past few years. She reports problems mostly with short-term memory, sometimes she forgets to take her medications and her son would remind her. She misplaces things frequently and  has been told that she repeats herself. She left the stove on a few times. She has to write everything down. She denies getting lost driving, and reports she can find her way back if she makes a wrong turn. She lives with her son with cerebral palsy, and denies any missed bill payments. They are planning to move in with her grandson's family soon.    She has a history of migraines since childhood, worse with weather changes and loud sounds and smells. She has occasional dizziness and gets motion sick quickly. She denies any diplopia, blurred vision, dysarthria, dysphagia. She has right hand and arm numbness and tingling when driving. She has chronic neck and back pain, at times triggering her headaches. She has problems with her rectum, but denies any constipation. No anosmia or tremors. She became tearful reporting that her husband passed away in 22-Nov-2010, and reports mood is "up and down." She denies any history of head injuries or alcohol use. Her younger sister has memory problems and got lost driving at age 57.   Records and images were personally reviewed where available.  I personally reviewed MRI brain without contrast which was normal. TSH and B12 normal.      PREVIOUS MEDICATIONS:   CURRENT MEDICATIONS:  Outpatient Encounter Medications as of 11/16/2023  Medication Sig   acetaminophen  (TYLENOL ) 650 MG CR tablet Take 1,300 mg by mouth in the morning and at bedtime.   atorvastatin  (LIPITOR) 40 MG tablet Take 1 tablet (40 mg total) by mouth daily.   cyanocobalamin  1000 MCG tablet Take 1,000 mcg by mouth daily.   DULoxetine  (CYMBALTA ) 30 MG capsule Take 1 capsule (30 mg total) by mouth daily.   EXCEDRIN TENSION HEADACHE 500-65 MG TABS per tablet Take 1 tablet by mouth every 6 (six) hours as needed (for headaches).   famotidine  (PEPCID ) 20 MG tablet Take 1 tablet (20 mg total) by mouth daily. TAKE 1 TABLET(20 MG) BY MOUTH AT BEDTIME   icosapent  Ethyl (VASCEPA ) 1 g capsule Take 1 capsule (1 g  total) by mouth 2 (two) times daily.   lubiprostone  (AMITIZA ) 24 MCG capsule Take 1 capsule (24 mcg total) by mouth 2 (two) times daily as needed for constipation.   metoprolol  succinate (TOPROL -XL) 25 MG 24 hr tablet TAKE 1 TABLET BY MOUTH DAILY TO SUPPRESS INCREASED HEART RATE   mirtazapine  (REMERON ) 7.5 MG tablet Take 1 tablet (7.5 mg total) by mouth at bedtime.   pantoprazole  (PROTONIX ) 40 MG tablet Take 1 tablet (40 mg total) by mouth daily.   Potassium  99 MG TABS Take 99 mg by mouth at bedtime.   VITAMIN D3 SUPER STRENGTH 50 MCG (2000 UT) CAPS Take 2 capsules (4,000 Units total) by mouth at bedtime.   Zinc 50 MG TABS Take 25 mg by mouth at bedtime.   [DISCONTINUED] memantine  (NAMENDA ) 5 MG tablet Take 1 tablet (5 mg total) by mouth 2 (two) times daily.   HYDROcodone-acetaminophen  (NORCO/VICODIN) 5-325 MG tablet Take by mouth. (Patient not taking: Reported on 11/16/2023)   memantine  (NAMENDA ) 10 MG tablet Take 1 tablet twice a day   NON FORMULARY Take 1 tablet by mouth See admin instructions. Head Care Protective Health tablets- Take 1 tablet by mouth once a day as needed for neuro health (Patient not taking: Reported on 11/16/2023)   [DISCONTINUED] memantine  (NAMENDA ) 10 MG tablet Take 1 tablet twice a day   No facility-administered encounter medications on file as of 11/16/2023.       11/16/2023    5:00 PM 09/02/2018    1:45 PM 08/27/2017    2:37 PM  MMSE - Mini Mental State Exam  Orientation to time 4 5 5   Orientation to Place 5 5 5   Registration 3 3 3   Attention/ Calculation 4 5 5   Recall 1 3 2   Language- name 2 objects 2 2 2   Language- repeat 1 1 1   Language- follow 3 step command 3 3 3   Language- read & follow direction 1 1 1   Write a sentence 1 1 1   Copy design 1 1 1   Total score 26 30 29       01/23/2022   12:00 PM  Montreal Cognitive Assessment   Visuospatial/ Executive (0/5) 2  Naming (0/3) 3  Attention: Read list of digits (0/2) 2  Attention: Read list of letters  (0/1) 1  Attention: Serial 7 subtraction starting at 100 (0/3) 2  Language: Repeat phrase (0/2) 1  Language : Fluency (0/1) 0  Abstraction (0/2) 0  Delayed Recall (0/5) 2  Orientation (0/6) 6  Total 19  Adjusted Score (based on education) 19    Objective:     PHYSICAL EXAMINATION:    VITALS:   Vitals:   11/16/23 1454  BP: 114/64  Pulse: 70  SpO2: 100%  Weight: 135 lb (61.2 kg)  Height: 5\' 2"  (1.575 m)    GEN:  The patient appears stated age and is in NAD. HEENT:  Normocephalic, atraumatic.   Neurological examination:  General: NAD, well-groomed, appears stated age. Orientation: The patient is alert. Oriented to person, place and date Cranial nerves: There is good facial symmetry.The speech is fluent and clear. No aphasia or dysarthria. Fund of knowledge is appropriate. Recent and remote memory are impaired. Attention and concentration are reduced.  Able to name objects and repeat phrases.  Hearing is creased to conversational tone. Sensation: Sensation is intact to light touch throughout Motor: Strength is at least antigravity x4. DTR's 2/4 in UE/LE     Movement examination: Tone: There is normal tone in the UE/LE Abnormal movements:  no tremor.  No myoclonus.  No asterixis.   Coordination:  There is no decremation with RAM's. Normal finger to nose  Gait and Station: The patient has no difficulty arising out of a deep-seated chair without the use of the hands. The patient's stride length is good.  Gait is cautious and narrow.    Thank you for allowing us  the opportunity to participate in the care of this nice patient. Please do not hesitate to contact us  for any  questions or concerns.   Total time spent on today's visit was 30 minutes dedicated to this patient today, preparing to see patient, examining the patient, ordering tests and/or medications and counseling the patient, documenting clinical information in the EHR or other health record, independently interpreting  results and communicating results to the patient/family, discussing treatment and goals, answering patient's questions and coordinating care.  Cc:  Donnie Galea, MD  Tex Filbert 11/16/2023 5:25 PM

## 2023-11-16 NOTE — Patient Instructions (Addendum)
 It was a pleasure to see you today at our office.   Recommendations:  Repeat the MRI brain to look at the meningioma  Increase the Memantine   to 10 mg tablets twice daily  Continue mirtazapine  to 7.5 mg at night  Follow up in 6 months   Whom to call:  Memory  decline, memory medications: Call our office [phone]   For psychiatric meds, mood meds: Please have your primary care physician manage these medications.   Counseling regarding caregiver distress, including caregiver depression, anxiety and issues regarding community resources, adult day care programs, adult living facilities, or memory care questions:   Feel free to contact Misty Court Distance, Social Worker at [phone]   For assessment of decision of mental capacity and competency:  Call Dr. Laverne Potter, geriatric psychiatrist at [phone]  For guidance in geriatric dementia issues please call [facility] [phone]  Consider [facility]  [address], [place] [address] [phone]  Hours of Operation Mondays to Thursdays: 8 am to 8 pm,Fridays: 9 am to 8 pm, Saturdays: 9 am to 1 pm Sundays: Closed  [email or web address]   If you have any severe symptoms of a stroke, or other severe issues such as confusion,severe chills or fever, etc call 911 or go to the ER as you may need to be evaluated further       RECOMMENDATIONS FOR ALL PATIENTS WITH MEMORY PROBLEMS: 1. Continue to exercise (Recommend 30 minutes of walking everyday, or 3 hours every week) 2. Increase social interactions - continue going to [place] and enjoy social gatherings with friends and family 3. Eat healthy, avoid fried foods and eat more fruits and vegetables 4. Maintain adequate blood pressure, blood sugar, and blood cholesterol level. Reducing the risk of stroke and cardiovascular disease also helps promoting better  memory. 5. Avoid stressful situations. Live a simple life and avoid aggravations. Organize your time and prepare for the next day in anticipation. 6. Sleep well, avoid any interruptions of sleep and avoid any distractions in the bedroom that may interfere with adequate sleep quality 7. Avoid sugar, avoid sweets as there is a strong link between excessive sugar intake, diabetes, and cognitive impairment We discussed the Mediterranean diet, which has been shown to help patients reduce the risk of progressive memory disorders and reduces cardiovascular risk. This includes eating fish, eat fruits and green leafy vegetables, nuts like almonds and hazelnuts, walnuts, and also use olive oil. Avoid fast foods and fried foods as much as possible. Avoid sweets and sugar as sugar use has been linked to worsening of memory function.  There is always a concern of gradual progression of memory problems. If this is the case, then we may need to adjust level of care according to patient needs. Support, both to the patient and caregiver, should then be put into place.      You have been referred for a neuropsychological evaluation (i.e., evaluation of memory and thinking abilities). Please bring someone with you to this appointment if possible, as it is helpful for the doctor to hear from both you and another adult who knows you well. Please bring eyeglasses and hearing aids if you wear them.    The evaluation will take approximately 3 hours and has two parts:   The first part is a clinical interview with the neuropsychologist (Dr. Kitty Perkins or Dr. Annette Barters). During the interview, the neuropsychologist will speak with you and the individual you brought to the appointment.    The second  part of the evaluation is testing with the doctor's technician Bernabe Brew or Burdette Carolin). During the testing, the technician will ask you to remember different types of material, solve problems, and answer some questionnaires. Your family member will  not be present for this portion of the evaluation.   Please note: We must reserve several hours of the neuropsychologist's time and the psychometrician's time for your evaluation appointment. As such, there is a No-Show fee of $100. If you are unable to attend any of your appointments, please contact our office as soon as possible to reschedule.    FALL PRECAUTIONS: Be cautious when walking. Scan the area for obstacles that may increase the risk of trips and falls. When getting up in the mornings, sit up at the edge of the bed for a few minutes before getting out of bed. Consider elevating the bed at the head end to avoid drop of blood pressure when getting up. Walk always in a well-lit room (use night lights in the walls). Avoid area rugs or power cords from appliances in the middle of the walkways. Use a walker or a cane if necessary and consider physical therapy for balance exercise. Get your eyesight checked regularly.  FINANCIAL OVERSIGHT: Supervision, especially oversight when making financial decisions or transactions is also recommended.  HOME SAFETY: Consider the safety of the kitchen when operating appliances like stoves, microwave oven, and blender. Consider having supervision and share cooking responsibilities until no longer able to participate in those. Accidents with firearms and other hazards in the house should be identified and addressed as well.   ABILITY TO BE LEFT ALONE: If patient is unable to contact 911 operator, consider using LifeLine, or when the need is there, arrange for someone to stay with patients. Smoking is a fire hazard, consider supervision or cessation. Risk of wandering should be assessed by caregiver and if detected at any point, supervision and safe proof recommendations should be instituted.  MEDICATION SUPERVISION: Inability to self-administer medication needs to be constantly addressed. Implement a mechanism to ensure safe administration of the  medications.   DRIVING: Regarding driving, in patients with progressive memory problems, driving will be impaired. We advise to have someone else do the driving if trouble finding directions or if minor accidents are reported. Independent driving assessment is available to determine safety of driving.   If you are interested in the driving assessment, you can contact the following:  [employer] in [place] [phone]  [facility] [phone]  [facility] [phone] or [phone]    Mediterranean Diet A Mediterranean diet refers to food and lifestyle choices that are based on the traditions of countries located on the [employer]. This way of eating has been shown to help prevent certain conditions and improve outcomes for people who have chronic diseases, like kidney disease and heart disease. What are tips for following this plan? Lifestyle  Cook and eat meals together with your family, when possible. Drink enough fluid to keep your urine clear or pale yellow. Be physically active every day. This includes: Aerobic exercise like running or swimming. Leisure activities like gardening, walking, or housework. Get 7-8 hours of sleep each night. If recommended by your health care provider, drink red wine in moderation. This means 1 glass a day for nonpregnant women and 2 glasses a day for men. A glass of wine equals 5 oz (150 mL). Reading food labels  Check the serving size of packaged foods. For foods such  as rice and pasta, the serving size refers to the amount of cooked product, not dry. Check the total fat in packaged foods. Avoid foods that have saturated fat or trans fats. Check the ingredients list for added sugars, such as corn syrup. Shopping  At the grocery store, buy most of your food from the areas near the walls of the store. This includes: Fresh fruits and vegetables (produce). Grains,  beans, nuts, and seeds. Some of these may be available in unpackaged forms or large amounts (in bulk). Fresh seafood. Poultry and eggs. Low-fat dairy products. Buy whole ingredients instead of prepackaged foods. Buy fresh fruits and vegetables in-season from local farmers markets. Buy frozen fruits and vegetables in resealable bags. If you do not have access to quality fresh seafood, buy precooked frozen shrimp or canned fish, such as tuna, salmon, or sardines. Buy small amounts of raw or cooked vegetables, salads, or olives from the deli or salad bar at your store. Stock your pantry so you always have certain foods on hand, such as olive oil, canned tuna, canned tomatoes, rice, pasta, and beans. Cooking  Cook foods with extra-virgin olive oil instead of using butter or other vegetable oils. Have meat as a side dish, and have vegetables or grains as your main dish. This means having meat in small portions or adding small amounts of meat to foods like pasta or stew. Use beans or vegetables instead of meat in common dishes like chili or lasagna. Experiment with different cooking methods. Try roasting or broiling vegetables instead of steaming or sauteing them. Add frozen vegetables to soups, stews, pasta, or rice. Add nuts or seeds for added healthy fat at each meal. You can add these to yogurt, salads, or vegetable dishes. Marinate fish or vegetables using olive oil, lemon juice, garlic, and fresh herbs. Meal planning  Plan to eat 1 vegetarian meal one day each week. Try to work up to 2 vegetarian meals, if possible. Eat seafood 2 or more times a week. Have healthy snacks readily available, such as: Vegetable sticks with hummus. Greek yogurt. Fruit and nut trail mix. Eat balanced meals throughout the week. This includes: Fruit: 2-3 servings a day Vegetables: 4-5 servings a day Low-fat dairy: 2 servings a day Fish, poultry, or lean meat: 1 serving a day Beans and legumes: 2 or more  servings a week Nuts and seeds: 1-2 servings a day Whole grains: 6-8 servings a day Extra-virgin olive oil: 3-4 servings a day Limit red meat and sweets to only a few servings a month What are my food choices? Mediterranean diet Recommended Grains: Whole-grain pasta. Brown rice. Bulgar wheat. Polenta. Couscous. Whole-wheat bread. Dwyane Glad. Vegetables: Artichokes. Beets. Broccoli. Cabbage. Carrots. Eggplant. Green beans. Chard. Kale. Spinach. Onions. Leeks. Peas. Squash. Tomatoes. Peppers. Radishes. Fruits: Apples. Apricots. Avocado. Berries. Bananas. Cherries. Dates. Figs. Grapes. Lemons. Melon. Oranges. Peaches. Plums. Pomegranate. Meats and other protein foods: Beans. Almonds. Sunflower seeds. Pine nuts. Peanuts. Cod. Salmon. Scallops. Shrimp. Tuna. Tilapia. Clams. Oysters. Eggs. Dairy: Low-fat milk. Cheese. Greek yogurt. Beverages: Water. Red wine. Herbal tea. Fats and oils: Extra virgin olive oil. Avocado oil. Grape seed oil. Sweets and desserts: [place] yogurt with honey. Baked apples. Poached pears. Trail mix. Seasoning and other foods: Basil. Cilantro. Coriander. Cumin. Mint. Parsley. Sage. Rosemary. Tarragon. Garlic. Oregano. Thyme. Pepper. Balsalmic vinegar. Tahini. Hummus. Tomato sauce. Olives. Mushrooms. Limit these Grains: Prepackaged pasta or rice dishes. Prepackaged cereal with added sugar. Vegetables: Deep fried potatoes (french fries). Fruits: Fruit canned in syrup. Meats  and other protein foods: Beef. Pork. Lamb. Poultry with skin. Hot dogs. Helene Loader. Dairy: Ice cream. Sour cream. Whole milk. Beverages: Juice. Sugar-sweetened soft drinks. Beer. Liquor and spirits. Fats and oils: Butter. Canola oil. Vegetable oil. Beef fat (tallow). Lard. Sweets and desserts: Cookies. Cakes. Pies. Candy. Seasoning and other foods: Mayonnaise. Premade sauces and marinades. The items listed may not be a complete list. Talk with your dietitian about what dietary choices are right for  you. Summary The Mediterranean diet includes both food and lifestyle choices. Eat a variety of fresh fruits and vegetables, beans, nuts, seeds, and whole grains. Limit the amount of red meat and sweets that you eat. Talk with your health care provider about whether it is safe for you to drink red wine in moderation. This means 1 glass a day for nonpregnant women and 2 glasses a day for men. A glass of wine equals 5 oz (150 mL). This information is not intended to replace advice given to you by your health care provider. Make sure you discuss any questions you have with your health care provider. Document Released: 02/10/2016 Document Revised: 03/14/2016 Document Reviewed: 02/10/2016 Elsevier Interactive Patient Education  2017 [employer].    We have sent a referral to [facility] for your MRI and they will call you directly to schedule your appointment. They are located at [address] [facility]. If you need to contact them directly please call [phone].

## 2023-12-03 ENCOUNTER — Ambulatory Visit
Admission: RE | Admit: 2023-12-03 | Discharge: 2023-12-03 | Disposition: A | Discharge status: Home / Self Care | Source: Ambulatory Visit | Attending: Physician Assistant | Admitting: Physician Assistant

## 2023-12-03 DIAGNOSIS — D329 Benign neoplasm of meninges, unspecified: Secondary | ICD-10-CM

## 2023-12-03 DIAGNOSIS — G939 Disorder of brain, unspecified: Secondary | ICD-10-CM | POA: Diagnosis not present

## 2023-12-03 DIAGNOSIS — G319 Degenerative disease of nervous system, unspecified: Secondary | ICD-10-CM | POA: Diagnosis not present

## 2023-12-03 MED ORDER — GADOPICLENOL 0.5 MMOL/ML IV SOLN
7.5000 mL | Freq: Once | INTRAVENOUS | Status: AC | PRN
  Administered 2023-12-03: 7.5 mL via INTRAVENOUS

## 2023-12-04 ENCOUNTER — Ambulatory Visit: Payer: Self-pay | Admitting: Physician Assistant

## 2023-12-19 ENCOUNTER — Other Ambulatory Visit: Payer: Self-pay | Admitting: Family Medicine

## 2023-12-19 DIAGNOSIS — F339 Major depressive disorder, recurrent, unspecified: Secondary | ICD-10-CM

## 2023-12-19 DIAGNOSIS — F32A Depression, unspecified: Secondary | ICD-10-CM

## 2024-01-09 DIAGNOSIS — D2272 Melanocytic nevi of left lower limb, including hip: Secondary | ICD-10-CM | POA: Diagnosis not present

## 2024-01-09 DIAGNOSIS — D2262 Melanocytic nevi of left upper limb, including shoulder: Secondary | ICD-10-CM | POA: Diagnosis not present

## 2024-01-09 DIAGNOSIS — L538 Other specified erythematous conditions: Secondary | ICD-10-CM | POA: Diagnosis not present

## 2024-01-09 DIAGNOSIS — D2261 Melanocytic nevi of right upper limb, including shoulder: Secondary | ICD-10-CM | POA: Diagnosis not present

## 2024-01-09 DIAGNOSIS — Z85828 Personal history of other malignant neoplasm of skin: Secondary | ICD-10-CM | POA: Diagnosis not present

## 2024-01-09 DIAGNOSIS — L820 Inflamed seborrheic keratosis: Secondary | ICD-10-CM | POA: Diagnosis not present

## 2024-01-09 DIAGNOSIS — D225 Melanocytic nevi of trunk: Secondary | ICD-10-CM | POA: Diagnosis not present

## 2024-03-07 ENCOUNTER — Other Ambulatory Visit: Payer: Self-pay | Admitting: Family Medicine

## 2024-03-18 DIAGNOSIS — Z961 Presence of intraocular lens: Secondary | ICD-10-CM | POA: Diagnosis not present

## 2024-03-18 DIAGNOSIS — H0102A Squamous blepharitis right eye, upper and lower eyelids: Secondary | ICD-10-CM | POA: Diagnosis not present

## 2024-03-18 DIAGNOSIS — H25812 Combined forms of age-related cataract, left eye: Secondary | ICD-10-CM | POA: Diagnosis not present

## 2024-03-24 ENCOUNTER — Encounter (HOSPITAL_COMMUNITY): Payer: Self-pay

## 2024-03-27 DIAGNOSIS — H524 Presbyopia: Secondary | ICD-10-CM | POA: Diagnosis not present

## 2024-04-24 ENCOUNTER — Encounter (HOSPITAL_COMMUNITY): Payer: Self-pay

## 2024-05-16 ENCOUNTER — Ambulatory Visit (HOSPITAL_COMMUNITY)
Admission: RE | Admit: 2024-05-16 | Discharge: 2024-05-16 | Disposition: A | Discharge status: Home / Self Care | Source: Ambulatory Visit | Attending: Cardiovascular Disease | Admitting: Cardiovascular Disease

## 2024-05-16 DIAGNOSIS — I6523 Occlusion and stenosis of bilateral carotid arteries: Secondary | ICD-10-CM | POA: Insufficient documentation

## 2024-05-21 NOTE — Progress Notes (Signed)
 Assessment/Plan:   Mild Dementia likely due to Alzheimer's disease   Charlene Patterson is a very pleasant 78 y.o. RH female with a history of hypertension, hyperlipidemia, anxiety, depression, CKD stage III,  QTc prolongation, osteoarthritis and a recent diagnosis of Dementia likely due to Alzheimer's Disease seen today in follow up for memory loss. Patient is currently on memantine  10 mg twice daily, tolerating well.  She is also on mirtazapine  7.5 mg nightly for sleep.  Patient is able to participate on ADLs and to drive without significant difficulties.  Mood is stable .Her known meningioma is currently asymptomatic, stable per latest MRI of the brain.   Assessment & Plan Alzheimer's disease dementia   Memory remains stable with occasional difficulty in name recall.  MMSE today 27/30  Mood is variable, and maintaining good sleep hygiene is important.  Continue memantine  10 mg orally twice daily.   Meningioma under surveillance   The meningioma is stable with no symptoms attributed to it.  Continue surveillance with an MRI of the brain scheduled for June.  Depression   Mood is variable. Cymbalta  and mirtazapine  are used for mood and sleep.  Continue Cymbalta  for mood stabilization.  Discuss with her primary care physician about adjusting mirtazapine  dosage for sleep and mood.  Migraine   Migraines are managed with Excedrin, but she is advised to limit its use to prevent rebound headaches. Use Excedrin as needed, not more than twice a week, and consider Tylenol  for mild headaches.      Subjective:    This patient is accompanied in the office by her daughter who supplements the history.  Previous records as well as any outside records available were reviewed prior to todays visit. Patient was last seen on 11/16/2023 with MMSE 26/30.    Discussed the use of AI scribe software for clinical note transcription with the patient, who gave verbal consent to proceed.  History of Present  Illness Charlene Patterson is a 78 year old female with memory concerns who presents for a follow-up visit.  Cognitive impairment - Ongoing memory concerns, including difficulty remembering names and recent conversations - Frequently misplaces her phone, sometimes finding it in unusual places; uses brightly colored objects to aid in locating it - Long-term memory remains intact - Occasional repetition of statements and difficulty recalling names - Daughter perceives some changes in memory since last visit - No wandering tendencies or personality changes - Able to shower independently - Requires assistance from daughter for medication and financial management  Mood disturbance - Mood fluctuations described as 'up and down' - More emotional and sensitive, without aggression - Currently taking mirtazapine  at night for sleep, which improves sleep but causes vivid dreams - Restarted Cymbalta  this summer for emotional regulation, with perceived benefit  Headache and migraine - Occasional headaches, including migraines - Uses Excedrin as needed, typically no more than twice a week - No recent falls, head injuries, or stroke symptoms  Urinary incontinence - Occasional urinary incontinence - Uses pads as needed  Appetite and gastrointestinal function - Stable appetite - Continues to drink Boost - No significant changes in hunger - No issues with choking or cooking accidents - Occasional constipation, managed effectively  Functional status and safety awareness - Aware of her meningioma diagnosis - Continues to drive, using her phone for navigation when necessary - Lives with her son and grandson    MRI of the brain 12/04/2023, personally reviewed remarkable for redemonstrated densely enhancing mass in the right frontal lobe  appears to be dural based, most likely a meningioma, measuring 13 x 12 mm (mildly increased in size) as before, mildly indenting the underlying brain parenchyma without  underlying parenchymal edema and mild to moderate generalized cerebral atrophy, mild cerebellar atrophy.  04/17/22 MRI brain personally reviewed was remarkable for re-demonstrated densely enhancing mass in the right frontal lobe, which appears to be dural-based, most likely a meningioma (measuring 12 x 9 mm) and mild-to-moderate generalized cerebral atrophy and mild cerebellar atrophy       MRI  brain 03/22/22 personally reviewed shows   1. Enhancing mass right frontal lobe appears extra-axial in compatible with meningioma. No brain edema 2. Faintly enhancing 3 mm lesion left frontal white matter. Mild associated susceptibility. Appearance is nonspecific. 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 Metastatic disease possible.    Visit 12/2021 How long did patient have memory difficulties?  About 7 years, I feel that my memory is worse, I constantly have to remember conversations and keeping appointments .  She continues to try to do crossword puzzles and word finding, although these has become more difficult.  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 Patient lives with: Her son who has cerebral palsy.  She also lives with her grandson and his wife. Repeats oneself?  Endorsed Disoriented when walking into a room?  Patient denies   Leaving objects in unusual places?  She reports losing objects constantly, and recently she has been leaving her phone everywhere, including under the food in my car . Ambulates  with difficulty?   Patient denies, but I feel that my body is out of shape . Recent falls?  Patient denies   Any head injuries?  She reports having fallen from the roof when she was a child hitting her head .  No loss of consciousness  History of seizures?   Patient denies   Wandering behavior?  Patient denies   Patient drives?  She drives short distances,  and uses his GPS to drive.  Any mood changes?  Denies Any history of depression?:  Endorsed.  Her husband died in 31-May-2011, and she reports that has never recovered despite using good antidepressants.  She never has done any psychotherapy. Hallucinations?  Patient denies   Paranoia?  Patient denies   Patient reports that has intermittent insomnia, reports vivid dreams (not frightening), no REM behavior  History of sleep apnea?  Patient denies   Any hygiene concerns?  Patient denies   Independent of bathing and dressing?  Endorsed  Does the patient needs help with medications? Son monitors but she has them in a pillbox because otherwise she forgets to take them. Who is in charge of the finances? Grandson  Any changes in appetite?  Patient denies   Patient have trouble swallowing? Patient denies   Does the patient cook?  Patient denies   Any kitchen accidents such as leaving the stove on? Patient denies   Any headaches?  Patient denies   Double vision? Patient denies   Any focal numbness or tingling?  Patient denies   Chronic back pain Patient denies   Unilateral weakness?  Patient denies   Any tremors?  Patient denies   Any history of anosmia?  Denies  Any incontinence of urine?  Patient denies   Any bowel dysfunction?   Patient denies History of heavy alcohol intake?  Patient denies   History of heavy tobacco  use?  Patient denies   Family history of dementia?  Denies     Initial visit 01/05/2018, Dr. Georjean I had the pleasure of seeing Memori Sammon in follow-up in the neurology clinic on 01/05/2017.  The patient was last seen 9 months ago for worsening memory. MMSE in 04/2016 and 06/2015 were 30/30. She continued to report cognitive changes, including getting lost driving, missing bills and medications. She underwent Neuropsychological evaluation last December 2017 which was largely within normal limits. There was no evidence of a neurocognitive disorder. She endorsed significant anxiety and  depression, cognitive complaints most likely due to these.    Since her last visit, she reports that things have leveled off, everything has been going great. She denies getting lost driving, no missed bills or medications. She has been keeping active in her garden, which is helping her mind as well. She reports less stress. She reports migraines are under control. No dizziness, vision changes, focal numbness/tingling/weakness, no falls. Back pain better with PT.    HPI 06/18/2015: This is a 78 yo RH woman with a history of hyperlipidemia, chronic back pain,who presented for worsening memory. She reports that her memory is not too good, and started noticing changes gradually over the past few years. She reports problems mostly with short-term memory, sometimes she forgets to take her medications and her son would remind her. She misplaces things frequently and has been told that she repeats herself. She left the stove on a few times. She has to write everything down. She denies getting lost driving, and reports she can find her way back if she makes a wrong turn. She lives with her son with cerebral palsy, and denies any missed bill payments. They are planning to move in with her grandson's family soon.    She has a history of migraines since childhood, worse with weather changes and loud sounds and smells. She has occasional dizziness and gets motion sick quickly. She denies any diplopia, blurred vision, dysarthria, dysphagia. She has right hand and arm numbness and tingling when driving. She has chronic neck and back pain, at times triggering her headaches. She has problems with her rectum, but denies any constipation. No anosmia or tremors. She became tearful reporting that her husband passed away in 06/17/2011, and reports mood is up and down. She denies any history of head injuries or alcohol use. Her younger sister has memory problems and got lost driving at age 38.     PREVIOUS MEDICATIONS:   CURRENT  MEDICATIONS:  Outpatient Encounter Medications as of 05/22/2024  Medication Sig   acetaminophen  (TYLENOL ) 650 MG CR tablet Take 1,300 mg by mouth in the morning and at bedtime.   atorvastatin  (LIPITOR) 40 MG tablet Take 1 tablet (40 mg total) by mouth daily.   cyanocobalamin  1000 MCG tablet Take 1,000 mcg by mouth daily.   DULoxetine  (CYMBALTA ) 30 MG capsule TAKE 1 CAPSULE (30 MG TOTAL) BY MOUTH DAILY.   EXCEDRIN TENSION HEADACHE 500-65 MG TABS per tablet Take 1 tablet by mouth every 6 (six) hours as needed (for headaches).   famotidine  (PEPCID ) 20 MG tablet Take 1 tablet (20 mg total) by mouth daily. TAKE 1 TABLET(20 MG) BY MOUTH AT BEDTIME   HYDROcodone-acetaminophen  (NORCO/VICODIN) 5-325 MG tablet Take by mouth.   icosapent  Ethyl (VASCEPA ) 1 g capsule Take 1 capsule (1 g total) by mouth 2 (two) times daily.   lubiprostone  (AMITIZA ) 24 MCG capsule Take 1 capsule (24 mcg total) by mouth 2 (two) times daily  as needed for constipation.   metoprolol  succinate (TOPROL -XL) 25 MG 24 hr tablet TAKE 1 TABLET BY MOUTH DAILY TO SUPPRESS INCREASED HEART RATE   mirtazapine  (REMERON ) 7.5 MG tablet TAKE 1 TABLET (7.5 MG TOTAL) BY MOUTH AT BEDTIME.   NON FORMULARY Take 1 tablet by mouth See admin instructions. Head Care Protective Health tablets- Take 1 tablet by mouth once a day as needed for neuro health   pantoprazole  (PROTONIX ) 40 MG tablet Take 1 tablet (40 mg total) by mouth daily.   Potassium 99 MG TABS Take 99 mg by mouth at bedtime.   VITAMIN D3 SUPER STRENGTH 50 MCG (2000 UT) CAPS Take 2 capsules (4,000 Units total) by mouth at bedtime.   Zinc 50 MG TABS Take 25 mg by mouth at bedtime.   [DISCONTINUED] memantine  (NAMENDA ) 10 MG tablet Take 1 tablet twice a day   memantine  (NAMENDA ) 10 MG tablet Take 1 tablet twice a day   No facility-administered encounter medications on file as of 05/22/2024.       05/22/2024    5:00 PM 11/16/2023    5:00 PM 09/02/2018    1:45 PM  MMSE - Mini Mental State  Exam  Orientation to time 5 4 5   Orientation to Place 4 5 5   Registration 3 3 3   Attention/ Calculation 4 4 5   Recall 2 1 3   Language- name 2 objects 2 2 2   Language- repeat 1 1 1   Language- follow 3 step command 3 3 3   Language- read & follow direction 1 1 1   Write a sentence 1 1 1   Copy design 1 1 1   Total score 27 26 30       01/23/2022   12:00 PM  Montreal Cognitive Assessment   Visuospatial/ Executive (0/5) 2  Naming (0/3) 3  Attention: Read list of digits (0/2) 2  Attention: Read list of letters (0/1) 1  Attention: Serial 7 subtraction starting at 100 (0/3) 2  Language: Repeat phrase (0/2) 1  Language : Fluency (0/1) 0  Abstraction (0/2) 0  Delayed Recall (0/5) 2  Orientation (0/6) 6  Total 19  Adjusted Score (based on education) 19    Objective:    Neurological Exam:    VITALS:   Vitals:   05/22/24 1421  BP: 111/70  Pulse: 68  Resp: 20  SpO2: 96%  Weight: 130 lb (59 kg)  Height: 5' 2 (1.575 m)    GEN:  The patient appears stated age and is in NAD. HEENT:  Normocephalic, atraumatic.   Neurological examination:  General: NAD, well-groomed, appears stated age. Orientation: The patient is alert. Oriented to person, place and date Cranial nerves: There is good facial symmetry.The speech is fluent and clear. No aphasia or dysarthria. Fund of knowledge is appropriate. Recent and remote memory are impaired. Attention and concentration are reduced. Able to name objects and repeat phrases.  Hearing is decreased to conversational tone. Delayed recall 2/3 Sensation: Sensation is intact to light touch throughout Motor: Strength is at least antigravity x4. DTR's 2/4 in UE/LE     Movement examination: Tone: There is normal tone in the UE/LE Abnormal movements:  no tremor.  No myoclonus.  No asterixis.   Coordination:  There is no decremation with RAM's. Normal finger to nose  Gait and Station: The patient has no difficulty arising out of a deep-seated chair  without the use of the hands. The patient's stride length is good.  Gait is cautious and narrow.    Thank  you for allowing us  the opportunity to participate in the care of this nice patient. Please do not hesitate to contact us  for any questions or concerns.   Total time spent on today's visit was 30 minutes dedicated to this patient today, preparing to see patient, examining the patient, ordering tests and/or medications and counseling the patient, documenting clinical information in the EHR or other health record, independently interpreting results and communicating results to the patient/family, discussing treatment and goals, answering patient's questions and coordinating care.  Cc:  Cleatus Arlyss RAMAN, MD  Camie Sevin 05/22/2024 5:32 PM

## 2024-05-22 ENCOUNTER — Ambulatory Visit: Admitting: Physician Assistant

## 2024-05-22 ENCOUNTER — Encounter: Payer: Self-pay | Admitting: Physician Assistant

## 2024-05-22 VITALS — BP 111/70 | HR 68 | Resp 20 | Ht 62.0 in | Wt 130.0 lb

## 2024-05-22 DIAGNOSIS — F03A4 Unspecified dementia, mild, with anxiety: Secondary | ICD-10-CM | POA: Diagnosis not present

## 2024-05-22 MED ORDER — MEMANTINE HCL 10 MG PO TABS: ORAL_TABLET | ORAL | 3 refills | Status: DC

## 2024-05-22 NOTE — Patient Instructions (Signed)
 It was a pleasure to see you today at our office.   Recommendations:  Continue  Memantine   to 10 mg tablets twice daily  Continue mirtazapine  to 7.5 mg at night  Follow up in 6 months     [email or web address]/parks-[email or web address]/active-adults-[email or web address]   If you have any severe symptoms of a stroke, or other severe issues such as confusion,severe chills or fever, etc call 911 or go to the ER as you may need to be evaluated further       RECOMMENDATIONS FOR ALL PATIENTS WITH MEMORY PROBLEMS: 1. Continue to exercise (Recommend 30 minutes of walking everyday, or 3 hours every week) 2. Increase social interactions - continue going to [place] and enjoy social gatherings with friends and family 3. Eat healthy, avoid fried foods and eat more fruits and vegetables 4. Maintain adequate blood pressure, blood sugar, and blood cholesterol level. Reducing the risk of stroke and cardiovascular disease also helps promoting better memory. 5. Avoid stressful situations. Live a simple life and avoid aggravations. Organize your time and prepare for the next day in anticipation. 6. Sleep well, avoid any interruptions of sleep and avoid any distractions in the bedroom that may interfere with adequate sleep quality 7. Avoid sugar, avoid sweets as there is a strong link between excessive sugar intake, diabetes, and cognitive impairment We discussed the Mediterranean diet, which has been shown to help patients reduce the risk of progressive memory disorders and reduces cardiovascular risk. This includes eating fish, eat fruits and green leafy vegetables, nuts like almonds and hazelnuts, walnuts, and also use olive oil. Avoid fast foods and fried foods as much as possible. Avoid sweets and sugar as sugar use has been linked to worsening of memory function.  There is always a concern of gradual progression of memory problems. If this is the case, then we may need to adjust level  of care according to patient needs. Support, both to the patient and caregiver, should then be put into place.      You have been referred for a neuropsychological evaluation (i.e., evaluation of memory and thinking abilities). Please bring someone with you to this appointment if possible, as it is helpful for the doctor to hear from both you and another adult who knows you well. Please bring eyeglasses and hearing aids if you wear them.    The evaluation will take approximately 3 hours and has two parts:   The first part is a clinical interview with the neuropsychologist (Dr. Richie or Dr. Jackquline). During the interview, the neuropsychologist will speak with you and the individual you brought to the appointment.    The second part of the evaluation is testing with the doctor's technician Neal or Luke). During the testing, the technician will ask you to remember different types of material, solve problems, and answer some questionnaires. Your family member will not be present for this portion of the evaluation.   Please note: We must reserve several hours of the neuropsychologist's time and the psychometrician's time for your evaluation appointment. As such, there is a No-Show fee of $100. If you are unable to attend any of your appointments, please contact our office as soon as possible to reschedule.    FALL PRECAUTIONS: Be cautious when walking. Scan the area for obstacles that may increase the risk of trips and falls. When getting up in the mornings, sit up at the edge of the bed for a few minutes before getting out of bed. Consider elevating the bed at the head end  to avoid drop of blood pressure when getting up. Walk always in a well-lit room (use night lights in the walls). Avoid area rugs or power cords from appliances in the middle of the walkways. Use a walker or a cane if necessary and consider physical therapy for balance exercise. Get your eyesight checked regularly.  FINANCIAL  OVERSIGHT: Supervision, especially oversight when making financial decisions or transactions is also recommended.  HOME SAFETY: Consider the safety of the kitchen when operating appliances like stoves, microwave oven, and blender. Consider having supervision and share cooking responsibilities until no longer able to participate in those. Accidents with firearms and other hazards in the house should be identified and addressed as well.   ABILITY TO BE LEFT ALONE: If patient is unable to contact 911 operator, consider using LifeLine, or when the need is there, arrange for someone to stay with patients. Smoking is a fire hazard, consider supervision or cessation. Risk of wandering should be assessed by caregiver and if detected at any point, supervision and safe proof recommendations should be instituted.  MEDICATION SUPERVISION: Inability to self-administer medication needs to be constantly addressed. Implement a mechanism to ensure safe administration of the medications.   DRIVING: Regarding driving, in patients with progressive memory problems, driving will be impaired. We advise to have someone else do the driving if trouble finding directions or if minor accidents are reported. Independent driving assessment is available to determine safety of driving.   If you are interested in the driving assessment, you can contact the following:  [employer] in [place] [phone]  [facility] [phone]  [facility] [phone] or [phone]    Mediterranean Diet A Mediterranean diet refers to food and lifestyle choices that are based on the traditions of countries located on the [employer]. This way of eating has been shown to help prevent certain conditions and improve outcomes for people who have chronic diseases, like kidney disease and heart disease. What are tips for following this plan? Lifestyle  Cook  and eat meals together with your family, when possible. Drink enough fluid to keep your urine clear or pale yellow. Be physically active every day. This includes: Aerobic exercise like running or swimming. Leisure activities like gardening, walking, or housework. Get 7-8 hours of sleep each night. If recommended by your health care provider, drink red wine in moderation. This means 1 glass a day for nonpregnant women and 2 glasses a day for men. A glass of wine equals 5 oz (150 mL). Reading food labels  Check the serving size of packaged foods. For foods such as rice and pasta, the serving size refers to the amount of cooked product, not dry. Check the total fat in packaged foods. Avoid foods that have saturated fat or trans fats. Check the ingredients list for added sugars, such as corn syrup. Shopping  At the grocery store, buy most of your food from the areas near the walls of the store. This includes: Fresh fruits and vegetables (produce). Grains, beans, nuts, and seeds. Some of these may be available in unpackaged forms or large amounts (in bulk). Fresh seafood. Poultry and eggs. Low-fat dairy products. Buy whole ingredients instead of prepackaged foods. Buy fresh fruits and vegetables in-season from local farmers markets. Buy frozen fruits and vegetables in resealable bags. If you do not have access to quality fresh seafood, buy precooked frozen shrimp or canned fish, such as tuna, salmon, or sardines. Buy small amounts of raw  or cooked vegetables, salads, or olives from the deli or salad bar at your store. Stock your pantry so you always have certain foods on hand, such as olive oil, canned tuna, canned tomatoes, rice, pasta, and beans. Cooking  Cook foods with extra-virgin olive oil instead of using butter or other vegetable oils. Have meat as a side dish, and have vegetables or grains as your main dish. This means having meat in small portions or adding small amounts of meat to  foods like pasta or stew. Use beans or vegetables instead of meat in common dishes like chili or lasagna. Experiment with different cooking methods. Try roasting or broiling vegetables instead of steaming or sauteing them. Add frozen vegetables to soups, stews, pasta, or rice. Add nuts or seeds for added healthy fat at each meal. You can add these to yogurt, salads, or vegetable dishes. Marinate fish or vegetables using olive oil, lemon juice, garlic, and fresh herbs. Meal planning  Plan to eat 1 vegetarian meal one day each week. Try to work up to 2 vegetarian meals, if possible. Eat seafood 2 or more times a week. Have healthy snacks readily available, such as: Vegetable sticks with hummus. Greek yogurt. Fruit and nut trail mix. Eat balanced meals throughout the week. This includes: Fruit: 2-3 servings a day Vegetables: 4-5 servings a day Low-fat dairy: 2 servings a day Fish, poultry, or lean meat: 1 serving a day Beans and legumes: 2 or more servings a week Nuts and seeds: 1-2 servings a day Whole grains: 6-8 servings a day Extra-virgin olive oil: 3-4 servings a day Limit red meat and sweets to only a few servings a month What are my food choices? Mediterranean diet Recommended Grains: Whole-grain pasta. Brown rice. Bulgar wheat. Polenta. Couscous. Whole-wheat bread. [place] Madeira. Vegetables: Artichokes. Beets. Broccoli. Cabbage. Carrots. Eggplant. Green beans. Chard. Kale. Spinach. Onions. Leeks. Peas. Squash. Tomatoes. Peppers. Radishes. Fruits: Apples. Apricots. Avocado. Berries. Bananas. Cherries. Dates. Figs. Grapes. Lemons. Melon. Oranges. Peaches. Plums. Pomegranate. Meats and other protein foods: Beans. Almonds. Sunflower seeds. Pine nuts. Peanuts. Cod. Salmon. Scallops. Shrimp. Tuna. Tilapia. Clams. Oysters. Eggs. Dairy: Low-fat milk. Cheese. Greek yogurt. Beverages: Water. Red wine. Herbal tea. Fats and oils: Extra virgin olive oil. Avocado oil. Grape seed  oil. Sweets and desserts: Greek yogurt with honey. Baked apples. Poached pears. Trail mix. Seasoning and other foods: Basil. Cilantro. Coriander. Cumin. Mint. Parsley. Sage. Rosemary. Tarragon. Garlic. Oregano. Thyme. Pepper. Balsalmic vinegar. Tahini. Hummus. Tomato sauce. Olives. Mushrooms. Limit these Grains: Prepackaged pasta or rice dishes. Prepackaged cereal with added sugar. Vegetables: Deep fried potatoes (french fries). Fruits: Fruit canned in syrup. Meats and other protein foods: Beef. Pork. Lamb. Poultry with skin. Hot dogs. [place]. Dairy: Ice cream. Sour cream. Whole milk. Beverages: Juice. Sugar-sweetened soft drinks. Beer. Liquor and spirits. Fats and oils: Butter. Canola oil. Vegetable oil. Beef fat (tallow). Lard. Sweets and desserts: Cookies. Cakes. Pies. Candy. Seasoning and other foods: Mayonnaise. Premade sauces and marinades. The items listed may not be a complete list. Talk with your dietitian about what dietary choices are right for you. Summary The Mediterranean diet includes both food and lifestyle choices. Eat a variety of fresh fruits and vegetables, beans, nuts, seeds, and whole grains. Limit the amount of red meat and sweets that you eat. Talk with your health care provider about whether it is safe for you to drink red wine in moderation. This means 1 glass a day for nonpregnant women and 2 glasses a day for men. A glass  of wine equals 5 oz (150 mL). This information is not intended to replace advice given to you by your health care provider. Make sure you discuss any questions you have with your health care provider. Document Released: 02/10/2016 Document Revised: 03/14/2016 Document Reviewed: 02/10/2016 Elsevier Interactive Patient Education  2017 [employer].    We have sent a referral to [facility] for your MRI and they will call you directly to schedule your appointment. They are located at [address] [facility]. If you need to contact them directly  please call [phone].

## 2024-05-23 ENCOUNTER — Ambulatory Visit: Payer: Self-pay

## 2024-05-26 DIAGNOSIS — Z85828 Personal history of other malignant neoplasm of skin: Secondary | ICD-10-CM | POA: Diagnosis not present

## 2024-05-26 DIAGNOSIS — M810 Age-related osteoporosis without current pathological fracture: Secondary | ICD-10-CM | POA: Diagnosis not present

## 2024-05-26 DIAGNOSIS — F411 Generalized anxiety disorder: Secondary | ICD-10-CM | POA: Diagnosis not present

## 2024-05-26 DIAGNOSIS — E785 Hyperlipidemia, unspecified: Secondary | ICD-10-CM | POA: Diagnosis not present

## 2024-05-26 DIAGNOSIS — I2510 Atherosclerotic heart disease of native coronary artery without angina pectoris: Secondary | ICD-10-CM | POA: Diagnosis not present

## 2024-05-26 DIAGNOSIS — Z8249 Family history of ischemic heart disease and other diseases of the circulatory system: Secondary | ICD-10-CM | POA: Diagnosis not present

## 2024-05-26 DIAGNOSIS — F329 Major depressive disorder, single episode, unspecified: Secondary | ICD-10-CM | POA: Diagnosis not present

## 2024-05-26 DIAGNOSIS — F02A4 Dementia in other diseases classified elsewhere, mild, with anxiety: Secondary | ICD-10-CM | POA: Diagnosis not present

## 2024-05-26 DIAGNOSIS — H269 Unspecified cataract: Secondary | ICD-10-CM | POA: Diagnosis not present

## 2024-05-26 DIAGNOSIS — K589 Irritable bowel syndrome without diarrhea: Secondary | ICD-10-CM | POA: Diagnosis not present

## 2024-05-26 DIAGNOSIS — Z85841 Personal history of malignant neoplasm of brain: Secondary | ICD-10-CM | POA: Diagnosis not present

## 2024-05-26 DIAGNOSIS — G309 Alzheimer's disease, unspecified: Secondary | ICD-10-CM | POA: Diagnosis not present

## 2024-05-26 DIAGNOSIS — I110 Hypertensive heart disease with heart failure: Secondary | ICD-10-CM | POA: Diagnosis not present

## 2024-05-26 DIAGNOSIS — Z818 Family history of other mental and behavioral disorders: Secondary | ICD-10-CM | POA: Diagnosis not present

## 2024-05-26 DIAGNOSIS — F02A3 Dementia in other diseases classified elsewhere, mild, with mood disturbance: Secondary | ICD-10-CM | POA: Diagnosis not present

## 2024-05-26 DIAGNOSIS — I471 Supraventricular tachycardia, unspecified: Secondary | ICD-10-CM | POA: Diagnosis not present

## 2024-05-26 DIAGNOSIS — I509 Heart failure, unspecified: Secondary | ICD-10-CM | POA: Diagnosis not present

## 2024-05-26 DIAGNOSIS — K219 Gastro-esophageal reflux disease without esophagitis: Secondary | ICD-10-CM | POA: Diagnosis not present

## 2024-05-28 ENCOUNTER — Other Ambulatory Visit: Payer: Self-pay | Admitting: Family Medicine

## 2024-05-28 DIAGNOSIS — E785 Hyperlipidemia, unspecified: Secondary | ICD-10-CM

## 2024-05-28 NOTE — Telephone Encounter (Signed)
 E-scribed refill.  Pls schedule annual exam, after 07/17/24, and fasting labs for additional refills.

## 2024-06-03 ENCOUNTER — Ambulatory Visit (INDEPENDENT_AMBULATORY_CARE_PROVIDER_SITE_OTHER)

## 2024-06-03 DIAGNOSIS — Z23 Encounter for immunization: Secondary | ICD-10-CM

## 2024-06-03 NOTE — Progress Notes (Signed)
 Per orders of Dr. Arlyss Solian, injection of high doseFlu Vaccine given by Nellie Hummer in left deltoid.

## 2024-06-05 ENCOUNTER — Other Ambulatory Visit: Payer: Self-pay | Admitting: Family Medicine

## 2024-06-05 DIAGNOSIS — E781 Pure hyperglyceridemia: Secondary | ICD-10-CM

## 2024-07-14 ENCOUNTER — Other Ambulatory Visit: Payer: Self-pay | Admitting: Family Medicine

## 2024-07-14 ENCOUNTER — Ambulatory Visit: Payer: Self-pay | Admitting: Family Medicine

## 2024-07-14 ENCOUNTER — Other Ambulatory Visit

## 2024-07-14 DIAGNOSIS — E559 Vitamin D deficiency, unspecified: Secondary | ICD-10-CM

## 2024-07-14 DIAGNOSIS — E781 Pure hyperglyceridemia: Secondary | ICD-10-CM

## 2024-07-14 DIAGNOSIS — E785 Hyperlipidemia, unspecified: Secondary | ICD-10-CM

## 2024-07-14 DIAGNOSIS — M810 Age-related osteoporosis without current pathological fracture: Secondary | ICD-10-CM

## 2024-07-14 LAB — CBC WITH DIFFERENTIAL/PLATELET
Basophils Absolute: 0 K/uL (ref 0.0–0.1)
Basophils Relative: 0.5 % (ref 0.0–3.0)
Eosinophils Absolute: 0.1 K/uL (ref 0.0–0.7)
Eosinophils Relative: 2.2 % (ref 0.0–5.0)
HCT: 37.8 % (ref 36.0–46.0)
Hemoglobin: 12.8 g/dL (ref 12.0–15.0)
Lymphocytes Relative: 51.6 % — ABNORMAL HIGH (ref 12.0–46.0)
Lymphs Abs: 2 K/uL (ref 0.7–4.0)
MCHC: 33.7 g/dL (ref 30.0–36.0)
MCV: 86.5 fl (ref 78.0–100.0)
Monocytes Absolute: 0.3 K/uL (ref 0.1–1.0)
Monocytes Relative: 7.3 % (ref 3.0–12.0)
Neutro Abs: 1.5 K/uL (ref 1.4–7.7)
Neutrophils Relative %: 38.4 % — ABNORMAL LOW (ref 43.0–77.0)
Platelets: 266 K/uL (ref 150.0–400.0)
RBC: 4.37 Mil/uL (ref 3.87–5.11)
RDW: 14.6 % (ref 11.5–15.5)
WBC: 3.9 K/uL — ABNORMAL LOW (ref 4.0–10.5)

## 2024-07-14 LAB — COMPREHENSIVE METABOLIC PANEL WITH GFR
ALT: 14 U/L (ref 3–35)
AST: 19 U/L (ref 5–37)
Albumin: 4.2 g/dL (ref 3.5–5.2)
Alkaline Phosphatase: 116 U/L (ref 39–117)
BUN: 19 mg/dL (ref 6–23)
CO2: 28 meq/L (ref 19–32)
Calcium: 9.4 mg/dL (ref 8.4–10.5)
Chloride: 105 meq/L (ref 96–112)
Creatinine, Ser: 1.06 mg/dL (ref 0.40–1.20)
GFR: 50.57 mL/min — ABNORMAL LOW
Glucose, Bld: 82 mg/dL (ref 70–99)
Potassium: 4.1 meq/L (ref 3.5–5.1)
Sodium: 140 meq/L (ref 135–145)
Total Bilirubin: 0.4 mg/dL (ref 0.2–1.2)
Total Protein: 6.6 g/dL (ref 6.0–8.3)

## 2024-07-14 LAB — VITAMIN D 25 HYDROXY (VIT D DEFICIENCY, FRACTURES): VITD: 42.38 ng/mL (ref 30.00–100.00)

## 2024-07-14 LAB — LIPID PANEL
Cholesterol: 160 mg/dL (ref 28–200)
HDL: 49.5 mg/dL
LDL Cholesterol: 79 mg/dL (ref 10–99)
NonHDL: 110.32
Total CHOL/HDL Ratio: 3
Triglycerides: 159 mg/dL — ABNORMAL HIGH (ref 10.0–149.0)
VLDL: 31.8 mg/dL (ref 0.0–40.0)

## 2024-07-14 LAB — TSH: TSH: 4.17 u[IU]/mL (ref 0.35–5.50)

## 2024-07-14 LAB — VITAMIN B12: Vitamin B-12: 392 pg/mL (ref 211–911)

## 2024-07-16 ENCOUNTER — Other Ambulatory Visit: Payer: Self-pay | Admitting: Family Medicine

## 2024-07-16 DIAGNOSIS — F450 Somatization disorder: Secondary | ICD-10-CM

## 2024-07-16 DIAGNOSIS — F339 Major depressive disorder, recurrent, unspecified: Secondary | ICD-10-CM

## 2024-07-17 ENCOUNTER — Ambulatory Visit: Payer: Medicare HMO

## 2024-07-17 NOTE — Telephone Encounter (Signed)
 Mirtazapine  Last filled:  04/10/24, #90 Last OV:  08/26/56, mood f/u Next OV:  07/21/24

## 2024-07-21 ENCOUNTER — Encounter: Payer: Self-pay | Admitting: Family Medicine

## 2024-07-21 ENCOUNTER — Ambulatory Visit: Admitting: Family Medicine

## 2024-07-21 VITALS — BP 120/84 | HR 63 | Temp 98.1°F | Ht 63.0 in | Wt 136.0 lb

## 2024-07-21 DIAGNOSIS — G8929 Other chronic pain: Secondary | ICD-10-CM | POA: Diagnosis not present

## 2024-07-21 DIAGNOSIS — F03A4 Unspecified dementia, mild, with anxiety: Secondary | ICD-10-CM

## 2024-07-21 DIAGNOSIS — F339 Major depressive disorder, recurrent, unspecified: Secondary | ICD-10-CM | POA: Diagnosis not present

## 2024-07-21 DIAGNOSIS — E781 Pure hyperglyceridemia: Secondary | ICD-10-CM | POA: Diagnosis not present

## 2024-07-21 DIAGNOSIS — E785 Hyperlipidemia, unspecified: Secondary | ICD-10-CM

## 2024-07-21 DIAGNOSIS — I493 Ventricular premature depolarization: Secondary | ICD-10-CM

## 2024-07-21 DIAGNOSIS — K219 Gastro-esophageal reflux disease without esophagitis: Secondary | ICD-10-CM

## 2024-07-21 DIAGNOSIS — Z0000 Encounter for general adult medical examination without abnormal findings: Secondary | ICD-10-CM

## 2024-07-21 DIAGNOSIS — F450 Somatization disorder: Secondary | ICD-10-CM

## 2024-07-21 DIAGNOSIS — M542 Cervicalgia: Secondary | ICD-10-CM | POA: Diagnosis not present

## 2024-07-21 DIAGNOSIS — Z7189 Other specified counseling: Secondary | ICD-10-CM

## 2024-07-21 DIAGNOSIS — M810 Age-related osteoporosis without current pathological fracture: Secondary | ICD-10-CM | POA: Diagnosis not present

## 2024-07-21 MED ORDER — VASCEPA 1 G PO CAPS: 1.0000 g | ORAL_CAPSULE | Freq: Two times a day (BID) | ORAL | 3 refills | Status: AC

## 2024-07-21 MED ORDER — MEMANTINE HCL 10 MG PO TABS: ORAL_TABLET | ORAL | 3 refills | Status: AC

## 2024-07-21 MED ORDER — METOPROLOL SUCCINATE ER 25 MG PO TB24: ORAL_TABLET | ORAL | 3 refills | Status: AC

## 2024-07-21 MED ORDER — DULOXETINE HCL 30 MG PO CPEP: 30.0000 mg | ORAL_CAPSULE | Freq: Every day | ORAL | 3 refills | Status: AC

## 2024-07-21 MED ORDER — ATORVASTATIN CALCIUM 40 MG PO TABS: 40.0000 mg | ORAL_TABLET | Freq: Every day | ORAL | 3 refills | Status: AC

## 2024-07-21 MED ORDER — PANTOPRAZOLE SODIUM 40 MG PO TBEC: 40.0000 mg | DELAYED_RELEASE_TABLET | Freq: Every day | ORAL | 3 refills | Status: AC

## 2024-07-21 MED ORDER — FAMOTIDINE 20 MG PO TABS: 20.0000 mg | ORAL_TABLET | Freq: Every day | ORAL | 3 refills | Status: AC

## 2024-07-21 MED ORDER — MIRTAZAPINE 7.5 MG PO TABS: 7.5000 mg | ORAL_TABLET | Freq: Every day | ORAL | 3 refills | Status: AC

## 2024-07-21 NOTE — Progress Notes (Unsigned)
 She hasn't needed amitiza . No recent use.  Med list updated.  Taking MVI with B12 in that, with B12 level lower but still normal.   Elevated Cholesterol: Using medications without problems:yes Muscle aches: not likely from statin.  Diet compliance: d/w pt.  Exercise: d/w pt.    Still on PPI.  Occ GERD, controlled with occ alka seltzer. Rare regurgitation with overeating.  Swallowing well.    H/o SVT and PVC, on metoprolol .  Some rare episodes of pulse elevation, once recently noted but that resolved.  This was the first event in quite a while for patient.  No syncope.   Using medication without problems or lightheadedness: yes Chest pain with exertion:no Edema:no Short of breath:no  Memory d/w pt. She notes changes, along with family noting slow changes overall.  Short term changes, trouble with name recall.  Not getting lost.  Not disoriented.  Occ repetition.  Still on mirtazepine with QT<400 on last EKG.   Still dealing with neck and back pain, less pain at the OV but pain with activity.    Charlene Patterson designated if patient were incapacitated.   Shingrix prev done.  Tdap 2014.   PNA vaccine prev done.  Flu 2025.   RSV d/w pt.   Covid vaccine d/w pt.   Mammogram 2025 Colonoscopy 2022.   DXA 2023. Osteoporosis.  Vit D wnl 2025.  See AVS 2026. DXA ordered.    Meds, vitals, and allergies reviewed.   ROS: Per HPI unless specifically indicated in ROS section   GEN: nad, alert and oriented HEENT: mucous membranes moist NECK: supple w/o LA CV: rrr PULM: ctab, no inc wob ABD: soft, +bs EXT: no edema SKIN: well perfused.

## 2024-07-21 NOTE — Patient Instructions (Addendum)
 Check with pharmacy about Tdap and RSV.    You can call for a mammogram and a bone density at these locations:  [facility] at [facility].  [address] floor) [employer] [phone]   Take care.  Glad to see you.

## 2024-07-23 DIAGNOSIS — Z0000 Encounter for general adult medical examination without abnormal findings: Secondary | ICD-10-CM | POA: Insufficient documentation

## 2024-07-23 NOTE — Assessment & Plan Note (Signed)
 She notes changes, along with family noting slow changes overall.  Short term changes, trouble with name recall.  Not getting lost.  Not disoriented.  Occ repetition.  Still on mirtazepine with QT<400 on last EKG. would continue mirtazapine  and Namenda .

## 2024-07-23 NOTE — Assessment & Plan Note (Signed)
 Still on PPI.  Occ GERD, controlled with occ alka seltzer.  Continue as is.

## 2024-07-23 NOTE — Assessment & Plan Note (Signed)
 Renee Skiver designated if patient were incapacitated.   Shingrix prev done.  Tdap 2014.   PNA vaccine prev done.  Flu 2025.   RSV d/w pt.   Covid vaccine d/w pt.   Mammogram 2025 Colonoscopy 2022.   DXA 2023. Osteoporosis.  Vit D wnl 2025.  See AVS 2026. DXA ordered.

## 2024-07-23 NOTE — Assessment & Plan Note (Signed)
 Charlene Patterson designated if patient were incapacitated.

## 2024-07-23 NOTE — Assessment & Plan Note (Signed)
 Continue Vascepa  and atorvastatin .  Labs discussed.

## 2024-07-23 NOTE — Assessment & Plan Note (Signed)
 Would continue Cymbalta  as this may help some.

## 2024-07-23 NOTE — Assessment & Plan Note (Signed)
 History of.  Would continue metoprolol  as is.

## 2024-07-25 ENCOUNTER — Ambulatory Visit

## 2024-08-26 ENCOUNTER — Other Ambulatory Visit

## 2024-10-03 ENCOUNTER — Ambulatory Visit

## 2024-10-20 ENCOUNTER — Ambulatory Visit: Admitting: Cardiology

## 2024-11-19 ENCOUNTER — Ambulatory Visit: Admitting: Physician Assistant
